# Patient Record
Sex: Male | Born: 1975 | Race: Black or African American | Hispanic: No | Marital: Married | Smoking: Former smoker
Health system: Southern US, Community
[De-identification: ages and names within clinical notes are randomized; demographics above are authoritative.]

## PROBLEM LIST (undated history)

## (undated) DIAGNOSIS — M1990 Unspecified osteoarthritis, unspecified site: Secondary | ICD-10-CM

## (undated) DIAGNOSIS — G4733 Obstructive sleep apnea (adult) (pediatric): Secondary | ICD-10-CM

## (undated) DIAGNOSIS — R030 Elevated blood-pressure reading, without diagnosis of hypertension: Secondary | ICD-10-CM

## (undated) DIAGNOSIS — K760 Fatty (change of) liver, not elsewhere classified: Secondary | ICD-10-CM

## (undated) DIAGNOSIS — T8859XA Other complications of anesthesia, initial encounter: Secondary | ICD-10-CM

## (undated) DIAGNOSIS — I10 Essential (primary) hypertension: Secondary | ICD-10-CM

## (undated) DIAGNOSIS — R7301 Impaired fasting glucose: Secondary | ICD-10-CM

## (undated) DIAGNOSIS — F32A Depression, unspecified: Secondary | ICD-10-CM

## (undated) DIAGNOSIS — Z87442 Personal history of urinary calculi: Secondary | ICD-10-CM

## (undated) DIAGNOSIS — F419 Anxiety disorder, unspecified: Secondary | ICD-10-CM

## (undated) DIAGNOSIS — E669 Obesity, unspecified: Secondary | ICD-10-CM

## (undated) HISTORY — DX: Elevated blood-pressure reading, without diagnosis of hypertension: R03.0

## (undated) HISTORY — DX: Obstructive sleep apnea (adult) (pediatric): G47.33

## (undated) HISTORY — DX: Obesity, unspecified: E66.9

## (undated) HISTORY — DX: Impaired fasting glucose: R73.01

---

## 2010-08-22 ENCOUNTER — Inpatient Hospital Stay (INDEPENDENT_AMBULATORY_CARE_PROVIDER_SITE_OTHER)
Admission: RE | Admit: 2010-08-22 | Discharge: 2010-08-22 | Disposition: A | Discharge status: Home / Self Care | Payer: Self-pay | Source: Ambulatory Visit | Attending: Family Medicine | Admitting: Family Medicine

## 2010-08-22 DIAGNOSIS — H109 Unspecified conjunctivitis: Secondary | ICD-10-CM

## 2011-07-18 LAB — HM DIABETES EYE EXAM: HM Diabetic Eye Exam: ABNORMAL

## 2011-08-21 ENCOUNTER — Other Ambulatory Visit (INDEPENDENT_AMBULATORY_CARE_PROVIDER_SITE_OTHER): Payer: 59

## 2011-08-21 ENCOUNTER — Encounter: Payer: Self-pay | Admitting: Internal Medicine

## 2011-08-21 ENCOUNTER — Ambulatory Visit (INDEPENDENT_AMBULATORY_CARE_PROVIDER_SITE_OTHER): Payer: 59 | Admitting: Internal Medicine

## 2011-08-21 VITALS — BP 118/78 | HR 68 | Temp 97.4°F | Resp 16 | Ht 68.0 in | Wt 277.2 lb

## 2011-08-21 DIAGNOSIS — Z23 Encounter for immunization: Secondary | ICD-10-CM

## 2011-08-21 DIAGNOSIS — Z136 Encounter for screening for cardiovascular disorders: Secondary | ICD-10-CM

## 2011-08-21 DIAGNOSIS — Z0000 Encounter for general adult medical examination without abnormal findings: Secondary | ICD-10-CM

## 2011-08-21 DIAGNOSIS — R0989 Other specified symptoms and signs involving the circulatory and respiratory systems: Secondary | ICD-10-CM

## 2011-08-21 DIAGNOSIS — R0609 Other forms of dyspnea: Secondary | ICD-10-CM

## 2011-08-21 DIAGNOSIS — R7309 Other abnormal glucose: Secondary | ICD-10-CM

## 2011-08-21 DIAGNOSIS — R0683 Snoring: Secondary | ICD-10-CM

## 2011-08-21 DIAGNOSIS — B353 Tinea pedis: Secondary | ICD-10-CM

## 2011-08-21 LAB — URINALYSIS, ROUTINE W REFLEX MICROSCOPIC
Bilirubin Urine: NEGATIVE
Hgb urine dipstick: NEGATIVE
Ketones, ur: NEGATIVE
Leukocytes, UA: NEGATIVE
Nitrite: NEGATIVE
Specific Gravity, Urine: >=1.03 (ref 1.000–1.030)
Total Protein, Urine: NEGATIVE
Urine Glucose: NEGATIVE
Urobilinogen, UA: 0.2 (ref 0.0–1.0)
pH: 5.5 (ref 5.0–8.0)

## 2011-08-21 LAB — COMPREHENSIVE METABOLIC PANEL
ALT: 31 U/L (ref 0–53)
AST: 25 U/L (ref 0–37)
Albumin: 3.4 g/dL — ABNORMAL LOW (ref 3.5–5.2)
Alkaline Phosphatase: 87 U/L (ref 39–117)
BUN: 8 mg/dL (ref 6–23)
CO2: 28 mEq/L (ref 19–32)
Calcium: 9 mg/dL (ref 8.4–10.5)
Chloride: 106 mEq/L (ref 96–112)
Creatinine, Ser: 1.1 mg/dL (ref 0.4–1.5)
GFR: 95.7 mL/min (ref >60.00)
Glucose, Bld: 97 mg/dL (ref 70–99)
Potassium: 4.1 mEq/L (ref 3.5–5.1)
Sodium: 141 mEq/L (ref 135–145)
Total Bilirubin: 0.7 mg/dL (ref 0.3–1.2)
Total Protein: 7.4 g/dL (ref 6.0–8.3)

## 2011-08-21 LAB — LIPID PANEL
Cholesterol: 200 mg/dL (ref 0–200)
HDL: 39.7 mg/dL (ref >39.00)
LDL Cholesterol: 148 mg/dL — ABNORMAL HIGH (ref 0–99)
Total CHOL/HDL Ratio: 5
Triglycerides: 63 mg/dL (ref 0.0–149.0)
VLDL: 12.6 mg/dL (ref 0.0–40.0)

## 2011-08-21 LAB — HM DIABETES FOOT EXAM

## 2011-08-21 LAB — CBC WITH DIFFERENTIAL/PLATELET
Basophils Absolute: 0 10*3/uL (ref 0.0–0.1)
Basophils Relative: 0.5 % (ref 0.0–3.0)
Eosinophils Absolute: 0.1 10*3/uL (ref 0.0–0.7)
Eosinophils Relative: 1.3 % (ref 0.0–5.0)
HCT: 43.8 % (ref 39.0–52.0)
Hemoglobin: 14.4 g/dL (ref 13.0–17.0)
Lymphocytes Relative: 46.6 % — ABNORMAL HIGH (ref 12.0–46.0)
Lymphs Abs: 4.1 10*3/uL — ABNORMAL HIGH (ref 0.7–4.0)
MCHC: 32.8 g/dL (ref 30.0–36.0)
MCV: 86 fl (ref 78.0–100.0)
Monocytes Absolute: 0.5 10*3/uL (ref 0.1–1.0)
Monocytes Relative: 5.8 % (ref 3.0–12.0)
Neutro Abs: 4 10*3/uL (ref 1.4–7.7)
Neutrophils Relative %: 45.8 % (ref 43.0–77.0)
Platelets: 246 10*3/uL (ref 150.0–400.0)
RBC: 5.09 Mil/uL (ref 4.22–5.81)
RDW: 14 % (ref 11.5–14.6)
WBC: 8.8 10*3/uL (ref 4.5–10.5)

## 2011-08-21 LAB — TSH: TSH: 1.62 u[IU]/mL (ref 0.35–5.50)

## 2011-08-21 LAB — HEMOGLOBIN A1C: Hgb A1c MFr Bld: 6.2 % (ref 4.6–6.5)

## 2011-08-21 MED ORDER — CICLOPIROX 0.77 % EX GEL: 1.0000 | Freq: Two times a day (BID) | CUTANEOUS | Status: DC

## 2011-08-21 NOTE — Assessment & Plan Note (Signed)
He needs a sleep evaluation

## 2011-08-21 NOTE — Assessment & Plan Note (Signed)
I will check his a1c today 

## 2011-08-21 NOTE — Assessment & Plan Note (Signed)
Exam done, labs ordered, vaccines were updated, pt ed material was given 

## 2011-08-21 NOTE — Patient Instructions (Signed)
Health Maintenance, Males A healthy lifestyle and preventative care can promote health and wellness.  Maintain regular health, dental, and eye exams.   Eat a healthy diet. Foods like vegetables, fruits, whole grains, low-fat dairy products, and lean protein foods contain the nutrients you need without too many calories. Decrease your intake of foods high in solid fats, added sugars, and salt. Get information about a proper diet from your caregiver, if necessary.   Regular physical exercise is one of the most important things you can do for your health. Most adults should get at least 150 minutes of moderate-intensity exercise (any activity that increases your heart rate and causes you to sweat) each week. In addition, most adults need muscle-strengthening exercises on 2 or more days a week.    Maintain a healthy weight. The body mass index (BMI) is a screening tool to identify possible weight problems. It provides an estimate of body fat based on height and weight. Your caregiver can help determine your BMI, and can help you achieve or maintain a healthy weight. For adults 20 years and older:   A BMI below 18.5 is considered underweight.   A BMI of 18.5 to 24.9 is normal.   A BMI of 25 to 29.9 is considered overweight.   A BMI of 30 and above is considered obese.   Maintain normal blood lipids and cholesterol by exercising and minimizing your intake of saturated fat. Eat a balanced diet with plenty of fruits and vegetables. Blood tests for lipids and cholesterol should begin at age 20 and be repeated every 5 years. If your lipid or cholesterol levels are high, you are over 50, or you are a high risk for heart disease, you may need your cholesterol levels checked more frequently.Ongoing high lipid and cholesterol levels should be treated with medicines, if diet and exercise are not effective.   If you smoke, find out from your caregiver how to quit. If you do not use tobacco, do not start.    If you choose to drink alcohol, do not exceed 2 drinks per day. One drink is considered to be 12 ounces (355 mL) of beer, 5 ounces (148 mL) of wine, or 1.5 ounces (44 mL) of liquor.   Avoid use of street drugs. Do not share needles with anyone. Ask for help if you need support or instructions about stopping the use of drugs.   High blood pressure causes heart disease and increases the risk of stroke. Blood pressure should be checked at least every 1 to 2 years. Ongoing high blood pressure should be treated with medicines if weight loss and exercise are not effective.   If you are 45 to 36 years old, ask your caregiver if you should take aspirin to prevent heart disease.   Diabetes screening involves taking a blood sample to check your fasting blood sugar level. This should be done once every 3 years, after age 45, if you are within normal weight and without risk factors for diabetes. Testing should be considered at a younger age or be carried out more frequently if you are overweight and have at least 1 risk factor for diabetes.   Colorectal cancer can be detected and often prevented. Most routine colorectal cancer screening begins at the age of 50 and continues through age 75. However, your caregiver may recommend screening at an earlier age if you have risk factors for colon cancer. On a yearly basis, your caregiver may provide home test kits to check for hidden   blood in the stool. Use of a small camera at the end of a tube, to directly examine the colon (sigmoidoscopy or colonoscopy), can detect the earliest forms of colorectal cancer. Talk to your caregiver about this at age 50, when routine screening begins. Direct examination of the colon should be repeated every 5 to 10 years through age 75, unless early forms of pre-cancerous polyps or small growths are found.   Hepatitis C blood testing is recommended for all people born from 1945 through 1965 and any individual with known risks for  hepatitis C.   Healthy men should no longer receive prostate-specific antigen (PSA) blood tests as part of routine cancer screening. Consult with your caregiver about prostate cancer screening.   Testicular cancer screening is not recommended for adolescents or adult males who have no symptoms. Screening includes self-exam, caregiver exam, and other screening tests. Consult with your caregiver about any symptoms you have or any concerns you have about testicular cancer.   Practice safe sex. Use condoms and avoid high-risk sexual practices to reduce the spread of sexually transmitted infections (STIs).   Use sunscreen with a sun protection factor (SPF) of 30 or greater. Apply sunscreen liberally and repeatedly throughout the day. You should seek shade when your shadow is shorter than you. Protect yourself by wearing long sleeves, pants, a wide-brimmed hat, and sunglasses year round, whenever you are outdoors.   Notify your caregiver of new moles or changes in moles, especially if there is a change in shape or color. Also notify your caregiver if a mole is larger than the size of a pencil eraser.   A one-time screening for abdominal aortic aneurysm (AAA) and surgical repair of large AAAs by sound wave imaging (ultrasonography) is recommended for ages 65 to 75 years who are current or former smokers.   Stay current with your immunizations.  Document Released: 10/07/2007 Document Revised: 03/30/2011 Document Reviewed: 09/05/2010 [employer] Patient Information 2012 [employer]. 

## 2011-08-21 NOTE — Assessment & Plan Note (Signed)
His EKG is normal 

## 2011-08-21 NOTE — Assessment & Plan Note (Signed)
Start loprox 

## 2011-08-21 NOTE — Progress Notes (Signed)
  Subjective:    Patient ID: Frank Howe, male    DOB: 05/01/1975, 36 y.o.   MRN: [id number]  HPI New to me he tells me that he needs to be screened for DM - he went to see an eye doctor for evaluation of blurred vision recently and was told that his eyes show damage from high blood sugars or cholesterol.   Review of Systems  Constitutional: Positive for unexpected weight change (some weight gain). Negative for fever, chills, diaphoresis, activity change, appetite change and fatigue.  HENT: Negative.   Eyes: Positive for visual disturbance.  Respiratory: Positive for apnea (and heavy snoring). Negative for cough, choking, chest tightness, shortness of breath, wheezing and stridor.   Cardiovascular: Negative for chest pain, palpitations and leg swelling.  Gastrointestinal: Negative for nausea, vomiting, abdominal pain, diarrhea, constipation, blood in stool, abdominal distention, anal bleeding and rectal pain.  Genitourinary: Negative for dysuria, urgency, frequency, hematuria, flank pain, decreased urine volume, discharge, penile swelling, scrotal swelling, enuresis, difficulty urinating, genital sores, penile pain and testicular pain.  Musculoskeletal: Negative for myalgias, back pain, joint swelling, arthralgias and gait problem.  Skin: Positive for rash (and itching on his feet). Negative for color change, pallor and wound.  Neurological: Negative for dizziness, tremors, seizures, syncope, facial asymmetry, speech difficulty, weakness, light-headedness, numbness and headaches.  Hematological: Negative for adenopathy. Does not bruise/bleed easily.  Psychiatric/Behavioral: Negative.        Objective:   Physical Exam  Vitals reviewed. Constitutional: He is oriented to person, place, and time. He appears well-developed and well-nourished. No distress.  HENT:  Head: Normocephalic and atraumatic.  Mouth/Throat: Oropharynx is clear and moist. No oropharyngeal exudate.  Eyes: Conjunctivae  are normal. Right eye exhibits no discharge. Left eye exhibits no discharge. No scleral icterus.  Neck: Normal range of motion. Neck supple. No JVD present. No tracheal deviation present. No thyromegaly present.  Cardiovascular: Normal rate, regular rhythm, normal heart sounds and intact distal pulses.  Exam reveals no gallop and no friction rub.   No murmur heard. Pulmonary/Chest: Effort normal and breath sounds normal. No stridor. No respiratory distress. He has no wheezes. He has no rales. He exhibits no tenderness.  Abdominal: Soft. Bowel sounds are normal. He exhibits no distension. There is no tenderness. There is no rebound and no guarding. Hernia confirmed negative in the right inguinal area and confirmed negative in the left inguinal area.  Genitourinary: Testes normal and penis normal. Right testis shows no mass, no swelling and no tenderness. Right testis is descended. Left testis shows no mass, no swelling and no tenderness. Left testis is descended. Uncircumcised. No phimosis, paraphimosis, hypospadias, penile erythema or penile tenderness. No discharge found.  Musculoskeletal: Normal range of motion. He exhibits no edema and no tenderness.  Lymphadenopathy:    He has no cervical adenopathy.       Right: No inguinal adenopathy present.       Left: No inguinal adenopathy present.  Neurological: He is oriented to person, place, and time.  Skin: Skin is warm and dry. No rash noted. He is not diaphoretic. No erythema. No pallor.       On his feet there is scattered scale and erythema and in the webspace there is scale and maceration  Psychiatric: His behavior is normal. Judgment and thought content normal.          Assessment & Plan:

## 2011-08-22 ENCOUNTER — Encounter: Payer: Self-pay | Admitting: Internal Medicine

## 2011-08-22 ENCOUNTER — Ambulatory Visit (INDEPENDENT_AMBULATORY_CARE_PROVIDER_SITE_OTHER): Payer: 59 | Admitting: Internal Medicine

## 2011-08-22 ENCOUNTER — Encounter: Payer: Self-pay | Admitting: *Deleted

## 2011-08-22 ENCOUNTER — Telehealth: Payer: Self-pay

## 2011-08-22 VITALS — BP 142/88 | HR 80 | Ht 68.0 in | Wt 284.6 lb

## 2011-08-22 DIAGNOSIS — R0609 Other forms of dyspnea: Secondary | ICD-10-CM

## 2011-08-22 DIAGNOSIS — B353 Tinea pedis: Secondary | ICD-10-CM

## 2011-08-22 DIAGNOSIS — G4733 Obstructive sleep apnea (adult) (pediatric): Secondary | ICD-10-CM

## 2011-08-22 DIAGNOSIS — R0683 Snoring: Secondary | ICD-10-CM

## 2011-08-22 DIAGNOSIS — R0989 Other specified symptoms and signs involving the circulatory and respiratory systems: Secondary | ICD-10-CM

## 2011-08-22 MED ORDER — OXICONAZOLE NITRATE 1 % EX LOTN: TOPICAL_LOTION | Freq: Two times a day (BID) | CUTANEOUS | Status: DC

## 2011-08-22 NOTE — Progress Notes (Signed)
08/22/11- 35 yoM smoker with loud snoring, witnessed apnea and daytime fatigue Referred by Dr. Yetta Barre. States sleeps an average of 5 hours a night.  Had recent physical exam, Dr. Yetta Barre suggested obstructive sleep apnea and recommended testing. History of loud snoring. 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 Told that he stops breathing. Bedtime 3 AM with very short sleep latency, waking once or twice during the night before up at 7:30 AM. Admits this is not enough sleep. Weight gain 10 pounds in 2 years. Little caffeine, no sleep medicine. No ENT surgery or recognized problems with his nose. History of high blood pressure but not heart or lung disease. He has had no surgery at all.  Prior to Admission medications   Medication Sig Start Date End Date Taking? Authorizing Provider  Acetaminophen (TYLENOL PO) Take by mouth as needed.   Yes Historical Provider, MD  terbinafine (LAMISIL AT) 1 % cream Apply topically 2 (two) times daily. 08/23/11 08/22/12  Etta Grandchild, MD   Past Medical History  Diagnosis Date  . Gout    History reviewed. No pertinent past surgical history.  Family History  Problem Relation Age of Onset  . Arthritis Mother   . Hypertension Mother   . Stroke Mother   . Cancer Neg Hx   . Alcohol abuse Neg Hx   . Heart disease Neg Hx   . Hyperlipidemia Neg Hx   . Hearing loss Neg Hx   . Kidney disease Neg Hx   . Early death Neg Hx    History   Social History  . Marital Status: Married    Spouse Name: N/A    Number of Children: 3  . Years of Education: N/A   Occupational History  .  Ups   Social History Main Topics  . Smoking status: Current Some Day Smoker -- 0.2 packs/day for 4 years    Types: Cigarettes  . Smokeless tobacco: Never Used   Comment: once every 3 mos  . Alcohol Use: 1.2 oz/week    2 Shots of liquor per week  . Drug Use: No  . Sexually Active: Yes   Other Topics Concern  . Not on file   Social History  Narrative   Caffienated drinks-yesSeat belt use often-yesRegular Exercise-noSmoke alarm in the home-yesFirearms/guns in the home-noHistory of physical abuse-yes   ROS-see HPI Constitutional:   No-weight loss, night sweats, fevers, chills,  +fatigue, lassitude. HEENT:   No-  headaches, difficulty swallowing, tooth/dental problems, sore throat,       No-  sneezing, itching, ear ache, nasal congestion, post nasal drip,  CV:  No-   chest pain, orthopnea, PND, swelling in lower extremities, anasarca, dizziness, palpitations Resp: No-   shortness of breath with exertion or at rest.              No-   productive cough,  No non-productive cough,  No- coughing up of blood.              No-   change in color of mucus.  No- wheezing.   Skin: No-   rash or lesions. GI:  No-   heartburn, indigestion, abdominal pain, nausea, vomiting, diarrhea,                 change in bowel habits, loss of appetite GU: No-   dysuria, change in color of urine, no urgency or frequency.  No- flank pain. MS:  No-  joint pain or swelling.  No- decreased range of motion.  No- back pain. Neuro-     nothing unusual Psych:  No- change in mood or affect. No depression or anxiety.  No memory loss.   OBJ- Physical Exam General- Alert, Oriented, Affect-appropriate, Distress- none acute, obese Skin- rash-none, lesions- none, excoriation- none Lymphadenopathy- none Head- atraumatic            Eyes- Gross vision intact, PERRLA, conjunctivae and secretions clear            Ears- Hearing, canals-normal            Nose- Clear, no-Septal dev, mucus, polyps, erosion, perforation             Throat- Mallampati III-IV , mucosa clear , drainage- none, tonsils- atrophic, thick neck Neck- flexible , trachea midline, no stridor , thyroid nl, carotid no bruit, short thick neck Chest - symmetrical excursion , unlabored           Heart/CV- RRR , no murmur , no gallop  , no rub, nl s1 s2                           - JVD- none , edema- none,  stasis changes- none, varices- none           Lung- clear to P&A, wheeze- none, cough- none , dullness-none, rub- none           Chest wall-  Abd- tender-no, distended-no, bowel sounds-present, HSM- no Br/ Gen/ Rectal- Not done, not indicated Extrem- cyanosis- none, clubbing, none, atrophy- none, strength- nl Neuro- grossly intact to observation

## 2011-08-22 NOTE — Telephone Encounter (Signed)
Received fax from pharmacy stating that ciclopirox 0.77% has been discontinued by manufacturer. Please advise on alternative. Thanks

## 2011-08-22 NOTE — Patient Instructions (Signed)
Order- split protocol NPSG   Dx OSA 

## 2011-08-23 ENCOUNTER — Telehealth: Payer: Self-pay

## 2011-08-23 DIAGNOSIS — B353 Tinea pedis: Secondary | ICD-10-CM

## 2011-08-23 MED ORDER — TERBINAFINE HCL 1 % EX CREA: TOPICAL_CREAM | Freq: Two times a day (BID) | CUTANEOUS | Status: DC

## 2011-08-23 NOTE — Telephone Encounter (Signed)
done

## 2011-08-23 NOTE — Telephone Encounter (Signed)
Received fax from pharmacy stating that oxistat lotion has also been discontinued by manufacturer

## 2011-08-25 NOTE — Assessment & Plan Note (Signed)
Discussed and will schedule sleep study.

## 2011-09-12 ENCOUNTER — Ambulatory Visit (HOSPITAL_BASED_OUTPATIENT_CLINIC_OR_DEPARTMENT_OTHER): Payer: 59

## 2011-09-25 ENCOUNTER — Ambulatory Visit (HOSPITAL_BASED_OUTPATIENT_CLINIC_OR_DEPARTMENT_OTHER): Payer: 59

## 2012-01-30 ENCOUNTER — Telehealth: Payer: Self-pay | Admitting: Internal Medicine

## 2012-01-30 ENCOUNTER — Encounter: Payer: Self-pay | Admitting: Internal Medicine

## 2012-01-30 ENCOUNTER — Ambulatory Visit (INDEPENDENT_AMBULATORY_CARE_PROVIDER_SITE_OTHER): Payer: 59 | Admitting: Internal Medicine

## 2012-01-30 VITALS — BP 122/84 | HR 74 | Temp 98.2°F | Resp 16 | Wt 277.0 lb

## 2012-01-30 DIAGNOSIS — H6092 Unspecified otitis externa, left ear: Secondary | ICD-10-CM | POA: Insufficient documentation

## 2012-01-30 DIAGNOSIS — T162XXA Foreign body in left ear, initial encounter: Secondary | ICD-10-CM | POA: Insufficient documentation

## 2012-01-30 DIAGNOSIS — H60399 Other infective otitis externa, unspecified ear: Secondary | ICD-10-CM

## 2012-01-30 DIAGNOSIS — T169XXA Foreign body in ear, unspecified ear, initial encounter: Secondary | ICD-10-CM

## 2012-01-30 MED ORDER — NEOMYCIN-COLIST-HC-THONZONIUM 3.3-3-10-0.5 MG/ML OT SUSP: 3.0000 [drp] | Freq: Four times a day (QID) | OTIC | Status: DC

## 2012-01-30 NOTE — Assessment & Plan Note (Signed)
Start cortisporin otic susp 

## 2012-01-30 NOTE — Progress Notes (Signed)
  Subjective:    Patient ID: Frank Howe, male    DOB: [birth year], 36 y.o.   MRN: [id number]  Otalgia  There is pain in the left ear. Episode onset: 2-3 weeks. The problem has been gradually worsening. There has been no fever. The pain is at a severity of 2/10. The pain is mild. Associated symptoms include hearing loss. Pertinent negatives include no neck pain, rhinorrhea or sore throat. He has tried nothing for the symptoms.      Review of Systems  Constitutional: Negative.   HENT: Positive for hearing loss and ear pain. Negative for congestion, sore throat, facial swelling, rhinorrhea, sneezing, trouble swallowing, neck pain and postnasal drip.   Eyes: Negative.   Respiratory: Negative.   Cardiovascular: Negative.   Gastrointestinal: Negative.   Genitourinary: Negative.   Musculoskeletal: Negative.   Neurological: Negative.   Hematological: Negative.   Psychiatric/Behavioral: Negative.        Objective:   Physical Exam  Vitals reviewed. Constitutional: He is oriented to person, place, and time. He appears well-developed and well-nourished. No distress.  HENT:  Right Ear: Hearing, tympanic membrane, external ear and ear canal normal.  Left Ear: Hearing and tympanic membrane normal. No lacerations. There is swelling. No drainage or tenderness. A foreign body (large piece of cotton deep in the [facility]) is present. No mastoid tenderness.  No middle ear effusion. No decreased hearing is noted.  Eyes: Conjunctivae normal are normal. Right eye exhibits no discharge. Left eye exhibits no discharge. No scleral icterus.  Neck: Normal range of motion. Neck supple. No JVD present. No tracheal deviation present. No thyromegaly present.  Cardiovascular: Normal rate, regular rhythm, normal heart sounds and intact distal pulses.  Exam reveals no gallop and no friction rub.   No murmur heard. Pulmonary/Chest: Effort normal and breath sounds normal. No stridor. No respiratory distress. He has no  wheezes. He has no rales. He exhibits no tenderness.  Abdominal: Soft. Bowel sounds are normal. He exhibits no distension and no mass. There is no tenderness. There is no rebound and no guarding.  Musculoskeletal: Normal range of motion. He exhibits no edema and no tenderness.  Lymphadenopathy:    He has no cervical adenopathy.  Neurological: He is oriented to person, place, and time.  Skin: Skin is warm and dry. No rash noted. He is not diaphoretic. No erythema. No pallor.  Psychiatric: He has a normal mood and affect. His behavior is normal. Judgment and thought content normal.          Assessment & Plan:

## 2012-01-30 NOTE — Patient Instructions (Signed)
Otitis Externa Otitis externa is a bacterial or fungal infection of the outer ear canal. This is the area from the eardrum to the outside of the ear. Otitis externa is sometimes called "swimmer's ear." CAUSES  Possible causes of infection include:  Swimming in dirty water.  Moisture remaining in the ear after swimming or bathing.  Mild injury (trauma) to the ear.  Objects stuck in the ear (foreign body).  Cuts or scrapes (abrasions) on the outside of the ear. SYMPTOMS  The first symptom of infection is often itching in the ear canal. Later signs and symptoms may include swelling and redness of the ear canal, ear pain, and yellowish-white fluid (pus) coming from the ear. The ear pain may be worse when pulling on the earlobe. DIAGNOSIS  Your caregiver will perform a physical exam. A sample of fluid may be taken from the ear and examined for bacteria or fungi. TREATMENT  Antibiotic ear drops are often given for 10 to 14 days. Treatment may also include pain medicine or corticosteroids to reduce itching and swelling. PREVENTION   Keep your ear dry. Use the corner of a towel to absorb water out of the ear canal after swimming or bathing.  Avoid scratching or putting objects inside your ear. This can damage the ear canal or remove the protective wax that lines the canal. This makes it easier for bacteria and fungi to grow.  Avoid swimming in lakes, polluted water, or poorly chlorinated pools.  You may use ear drops made of rubbing alcohol and vinegar after swimming. Combine equal parts of white vinegar and alcohol in a bottle. Put 3 or 4 drops into each ear after swimming. HOME CARE INSTRUCTIONS   Apply antibiotic ear drops to the ear canal as prescribed by your caregiver.  Only take over-the-counter or prescription medicines for pain, discomfort, or fever as directed by your caregiver.  If you have diabetes, follow any additional treatment instructions from your caregiver.  Keep all  follow-up appointments as directed by your caregiver. SEEK MEDICAL CARE IF:   You have a fever.  Your ear is still red, swollen, painful, or draining pus after 3 days.  Your redness, swelling, or pain gets worse.  You have a severe headache.  You have redness, swelling, pain, or tenderness in the area behind your ear. MAKE SURE YOU:   Understand these instructions.  Will watch your condition.  Will get help right away if you are not doing well or get worse. Document Released: 04/10/2005 Document Revised: 07/03/2011 Document Reviewed: 04/27/2011 [employer] Patient Information 2013 [employer].  

## 2012-01-30 NOTE — Assessment & Plan Note (Signed)
I was not able to remove the cotton, I have referred him to ENT

## 2012-01-30 NOTE — Telephone Encounter (Signed)
Pt req a note for today Office Visit for school. Please call pt once its done.

## 2012-01-31 NOTE — Telephone Encounter (Signed)
Note provided and pt notified

## 2012-04-01 ENCOUNTER — Other Ambulatory Visit: Payer: Self-pay | Admitting: Orthopedic Surgery

## 2012-12-16 ENCOUNTER — Encounter: Payer: Self-pay | Admitting: Medical

## 2012-12-16 ENCOUNTER — Ambulatory Visit (INDEPENDENT_AMBULATORY_CARE_PROVIDER_SITE_OTHER): Payer: 59 | Admitting: Medical

## 2012-12-16 VITALS — BP 112/80 | HR 64 | Temp 98.5°F | Resp 16 | Wt 269.0 lb

## 2012-12-16 DIAGNOSIS — M79642 Pain in left hand: Secondary | ICD-10-CM

## 2012-12-16 DIAGNOSIS — M79609 Pain in unspecified limb: Secondary | ICD-10-CM

## 2012-12-16 DIAGNOSIS — R400 Somnolence: Secondary | ICD-10-CM

## 2012-12-16 DIAGNOSIS — K429 Umbilical hernia without obstruction or gangrene: Secondary | ICD-10-CM

## 2012-12-16 DIAGNOSIS — M109 Gout, unspecified: Secondary | ICD-10-CM

## 2012-12-16 DIAGNOSIS — G4710 Hypersomnia, unspecified: Secondary | ICD-10-CM

## 2012-12-16 DIAGNOSIS — R5381 Other malaise: Secondary | ICD-10-CM

## 2012-12-16 DIAGNOSIS — E669 Obesity, unspecified: Secondary | ICD-10-CM

## 2012-12-16 DIAGNOSIS — R0681 Apnea, not elsewhere classified: Secondary | ICD-10-CM

## 2012-12-16 LAB — BASIC METABOLIC PANEL
BUN: 10 mg/dL (ref 6–23)
CO2: 26 mEq/L (ref 19–32)
Calcium: 9.4 mg/dL (ref 8.4–10.5)
Chloride: 104 mEq/L (ref 96–112)
Creat: 1.11 mg/dL (ref 0.50–1.35)
Glucose, Bld: 90 mg/dL (ref 70–99)
Potassium: 4.5 mEq/L (ref 3.5–5.3)
Sodium: 138 mEq/L (ref 135–145)

## 2012-12-16 LAB — URIC ACID: Uric Acid, Serum: 8.6 mg/dL — ABNORMAL HIGH (ref 4.0–7.8)

## 2012-12-16 MED ORDER — INDOMETHACIN 50 MG PO CAPS: ORAL_CAPSULE | ORAL | Status: DC

## 2012-12-16 NOTE — Patient Instructions (Signed)
Gout  Gout is an inflammatory condition (arthritis) caused by a buildup of uric acid crystals in the joints. Uric acid is a chemical that is normally present in the blood. Under some circumstances, uric acid can form into crystals in your joints. This causes joint redness, soreness, and swelling (inflammation). Repeat attacks are common. Over time, uric acid crystals can form into masses (tophi) near a joint, causing disfigurement. Gout is treatable and often preventable.  CAUSES   The disease begins with elevated levels of uric acid in the blood. Uric acid is produced by your body when it breaks down a naturally found substance called purines. This also happens when you eat certain foods such as meats and fish. Causes of an elevated uric acid level include:   Being passed down from parent to child (heredity).   Diseases that cause increased uric acid production (obesity, psoriasis, some cancers).   Excessive alcohol use.   Diet, especially diets rich in meat and seafood.   Medicines, including certain cancer-fighting drugs (chemotherapy), diuretics, and aspirin.   Chronic kidney disease. The kidneys are no longer able to remove uric acid well.   Problems with metabolism.  Conditions strongly associated with gout include:   Obesity.   High blood pressure.   High cholesterol.   Diabetes.  Not everyone with elevated uric acid levels gets gout. It is not understood why some people get gout and others do not. Surgery, joint injury, and eating too much of certain foods are some of the factors that can lead to gout.  SYMPTOMS    An attack of gout comes on quickly. It causes intense pain with redness, swelling, and warmth in a joint.   Fever can occur.   Often, only one joint is involved. Certain joints are more commonly involved:   Base of the big toe.   Knee.   Ankle.   Wrist.   Finger.  Without treatment, an attack usually goes away in a few days to weeks. Between attacks, you usually will not have  symptoms, which is different from many other forms of arthritis.  DIAGNOSIS   Your caregiver will suspect gout based on your symptoms and exam. Removal of fluid from the joint (arthrocentesis) is done to check for uric acid crystals. Your caregiver will give you a medicine that numbs the area (local anesthetic) and use a needle to remove joint fluid for exam. Gout is confirmed when uric acid crystals are seen in joint fluid, using a special microscope. Sometimes, blood, urine, and X-ray tests are also used.  TREATMENT   There are 2 phases to gout treatment: treating the sudden onset (acute) attack and preventing attacks (prophylaxis).  Treatment of an Acute Attack   Medicines are used. These include anti-inflammatory medicines or steroid medicines.   An injection of steroid medicine into the affected joint is sometimes necessary.   The painful joint is rested. Movement can worsen the arthritis.   You may use warm or cold treatments on painful joints, depending which works best for you.   Discuss the use of coffee, vitamin C, or cherries with your caregiver. These may be helpful treatment options.  Treatment to Prevent Attacks  After the acute attack subsides, your caregiver may advise prophylactic medicine. These medicines either help your kidneys eliminate uric acid from your body or decrease your uric acid production. You may need to stay on these medicines for a very long time.  The early phase of treatment with prophylactic medicine can be associated   with an increase in acute gout attacks. For this reason, during the first few months of treatment, your caregiver may also advise you to take medicines usually used for acute gout treatment. Be sure you understand your caregiver's directions.  You should also discuss dietary treatment with your caregiver. Certain foods such as meats and fish can increase uric acid levels. Other foods such as dairy can decrease levels. Your caregiver can give you a list of foods  to avoid.  HOME CARE INSTRUCTIONS    Do not take aspirin to relieve pain. This raises uric acid levels.   Only take over-the-counter or prescription medicines for pain, discomfort, or fever as directed by your caregiver.   Rest the joint as much as possible. When in bed, keep sheets and blankets off painful areas.   Keep the affected joint raised (elevated).   Use crutches if the painful joint is in your leg.   Drink enough water and fluids to keep your urine clear or pale yellow. This helps your body get rid of uric acid. Do not drink alcoholic beverages. They slow the passage of uric acid.   Follow your caregiver's dietary instructions. Pay careful attention to the amount of protein you eat. Your daily diet should emphasize fruits, vegetables, whole grains, and fat-free or low-fat milk products.   Maintain a healthy body weight.  SEEK MEDICAL CARE IF:    You have an oral temperature above 102 F (38.9 C).   You develop diarrhea, vomiting, or any side effects from medicines.   You do not feel better in 24 hours, or you are getting worse.  SEEK IMMEDIATE MEDICAL CARE IF:    Your joint becomes suddenly more tender and you have:   Chills.   An oral temperature above 102 F (38.9 C), not controlled by medicine.  MAKE SURE YOU:    Understand these instructions.   Will watch your condition.   Will get help right away if you are not doing well or get worse.  Document Released: 04/07/2000 Document Revised: 07/03/2011 Document Reviewed: 07/19/2009  [employer] Patient Information 2014 [employer].
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
## 2012-12-17 ENCOUNTER — Other Ambulatory Visit: Payer: Self-pay | Admitting: Medical

## 2012-12-17 MED ORDER — FEBUXOSTAT 40 MG PO TABS: 80.0000 mg | ORAL_TABLET | Freq: Every day | ORAL | Status: DC

## 2012-12-31 ENCOUNTER — Other Ambulatory Visit: Payer: Self-pay | Admitting: Family Medicine

## 2012-12-31 DIAGNOSIS — G4733 Obstructive sleep apnea (adult) (pediatric): Secondary | ICD-10-CM

## 2013-01-17 ENCOUNTER — Telehealth: Payer: Self-pay | Admitting: Internal Medicine

## 2013-01-17 ENCOUNTER — Other Ambulatory Visit: Payer: Self-pay | Admitting: Medical

## 2013-01-17 MED ORDER — METHYLPREDNISOLONE (PAK) 4 MG PO TABS: ORAL_TABLET | ORAL | Status: DC

## 2013-01-17 NOTE — Telephone Encounter (Signed)
Pt needs a refill on indomethacin. But states he would like something stronger cause its not working for him. [facility] [address]

## 2013-01-17 NOTE — Telephone Encounter (Signed)
Patient is aware of your instructions. CLS

## 2013-01-17 NOTE — Telephone Encounter (Signed)
Stop indocin or OTC NSAID such as ibuprofen, aleve, for the next week.  Begin medrol dose pak steroid . Often when we start new medication such as Uloric, that can sometimes precipitate a gout attack.  C/t Uloric, but do the steroid pak for the next week.

## 2013-01-20 ENCOUNTER — Institutional Professional Consult (permissible substitution): Payer: 59 | Admitting: Neurology

## 2013-01-22 ENCOUNTER — Institutional Professional Consult (permissible substitution): Payer: 59 | Admitting: Neurology

## 2013-01-27 ENCOUNTER — Encounter: Payer: Self-pay | Admitting: Medical

## 2013-01-27 ENCOUNTER — Ambulatory Visit (INDEPENDENT_AMBULATORY_CARE_PROVIDER_SITE_OTHER): Payer: 59 | Admitting: Medical

## 2013-01-27 ENCOUNTER — Telehealth: Payer: Self-pay | Admitting: Family Medicine

## 2013-01-27 VITALS — BP 120/82 | HR 92 | Temp 97.8°F | Resp 18 | Ht 69.0 in | Wt 271.0 lb

## 2013-01-27 DIAGNOSIS — E669 Obesity, unspecified: Secondary | ICD-10-CM

## 2013-01-27 DIAGNOSIS — R079 Chest pain, unspecified: Secondary | ICD-10-CM

## 2013-01-27 DIAGNOSIS — R0683 Snoring: Secondary | ICD-10-CM

## 2013-01-27 DIAGNOSIS — R0609 Other forms of dyspnea: Secondary | ICD-10-CM

## 2013-01-27 DIAGNOSIS — Z23 Encounter for immunization: Secondary | ICD-10-CM

## 2013-01-27 DIAGNOSIS — M109 Gout, unspecified: Secondary | ICD-10-CM

## 2013-01-27 DIAGNOSIS — Z0000 Encounter for general adult medical examination without abnormal findings: Secondary | ICD-10-CM

## 2013-01-27 LAB — POCT URINALYSIS DIPSTICK
Bilirubin, UA: NEGATIVE
Blood, UA: NEGATIVE
Glucose, UA: NEGATIVE
Ketones, UA: NEGATIVE
Leukocytes, UA: NEGATIVE
Nitrite, UA: NEGATIVE
Protein, UA: NEGATIVE
Spec Grav, UA: <=1.005
Urobilinogen, UA: NEGATIVE
pH, UA: 8

## 2013-01-27 LAB — HEPATIC FUNCTION PANEL
ALT: 29 U/L (ref 0–53)
AST: 20 U/L (ref 0–37)
Albumin: 3.9 g/dL (ref 3.5–5.2)
Alkaline Phosphatase: 90 U/L (ref 39–117)
Bilirubin, Direct: 0.2 mg/dL (ref 0.0–0.3)
Indirect Bilirubin: 0.6 mg/dL (ref 0.0–0.9)
Total Bilirubin: 0.8 mg/dL (ref 0.3–1.2)
Total Protein: 7.1 g/dL (ref 6.0–8.3)

## 2013-01-27 LAB — CBC WITH DIFFERENTIAL/PLATELET
Basophils Absolute: 0 10*3/uL (ref 0.0–0.1)
Basophils Relative: 0 % (ref 0–1)
Eosinophils Absolute: 0.2 10*3/uL (ref 0.0–0.7)
Eosinophils Relative: 2 % (ref 0–5)
HCT: 42.6 % (ref 39.0–52.0)
Hemoglobin: 14.4 g/dL (ref 13.0–17.0)
Lymphocytes Relative: 44 % (ref 12–46)
Lymphs Abs: 4.1 10*3/uL — ABNORMAL HIGH (ref 0.7–4.0)
MCH: 28 pg (ref 26.0–34.0)
MCHC: 33.8 g/dL (ref 30.0–36.0)
MCV: 82.9 fL (ref 78.0–100.0)
Monocytes Absolute: 0.5 10*3/uL (ref 0.1–1.0)
Monocytes Relative: 6 % (ref 3–12)
Neutro Abs: 4.6 10*3/uL (ref 1.7–7.7)
Neutrophils Relative %: 48 % (ref 43–77)
Platelets: 306 10*3/uL (ref 150–400)
RBC: 5.14 MIL/uL (ref 4.22–5.81)
RDW: 14.1 % (ref 11.5–15.5)
WBC: 9.4 10*3/uL (ref 4.0–10.5)

## 2013-01-27 LAB — LIPID PANEL
Cholesterol: 187 mg/dL (ref 0–200)
HDL: 44 mg/dL (ref >39)
LDL Cholesterol: 126 mg/dL — ABNORMAL HIGH (ref 0–99)
Total CHOL/HDL Ratio: 4.3 Ratio
Triglycerides: 86 mg/dL (ref <150)
VLDL: 17 mg/dL (ref 0–40)

## 2013-01-27 LAB — HEMOGLOBIN A1C
Hgb A1c MFr Bld: 6.3 % — ABNORMAL HIGH (ref <5.7)
Mean Plasma Glucose: 134 mg/dL — ABNORMAL HIGH (ref <117)

## 2013-01-27 MED ORDER — INDOMETHACIN 50 MG PO CAPS: ORAL_CAPSULE | ORAL | Status: DC

## 2013-01-27 MED ORDER — FEBUXOSTAT 40 MG PO TABS: 40.0000 mg | ORAL_TABLET | Freq: Every day | ORAL | Status: DC

## 2013-01-27 NOTE — Telephone Encounter (Signed)
Message copied by Janeice Robinson on Mon Jan 27, 2013  2:27 PM ------      Message from: Jac Canavan      Created: Mon Jan 27, 2013  1:36 PM       Refer to hospital-based sleep study, see last office note, Epworth sleep scale ------

## 2013-01-27 NOTE — Telephone Encounter (Signed)
Referral was sent over to [facility] sleep lab the [facility] sleep study was out of network. CLS
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
## 2013-01-27 NOTE — Patient Instructions (Signed)
Dr. Yancey Flemings, dentist [address] [place], [place] [address] [phone] [email or web address]   Begin Uloric 40mg  daily for gout prevention.   For the first 2 weeks as you start Uloric, begin Indocin 1-2 times to help prevent flare up.  Call if problems  We will look into the sleep study.  We will call with lab results.   Diabetes Meal Planning Guide The diabetes meal planning guide is a tool to help you plan your meals and snacks. It is important for people with diabetes to manage their blood glucose (sugar) levels. Choosing the right foods and the right amounts throughout your day will help control your blood glucose. Eating right can even help you improve your blood pressure and reach or maintain a healthy weight.  CARBOHYDRATE COUNTING MADE EASY When you eat carbohydrates, they turn to sugar. This raises your blood glucose level. Counting carbohydrates can help you control this level so you feel better. When you plan your meals by counting carbohydrates, you can have more flexibility in what you eat and balance your medicine with your food intake.  Carbohydrate counting simply means adding up the total amount of carbohydrate grams in your meals and snacks. Try to eat about the same amount at each meal. Foods with carbohydrates are listed below.   Each portion below is 1 carbohydrate serving or 15 grams of carbohydrates.    1800 Calorie Diet for Diabetes Meal Planning The 1800 calorie diet is designed for eating up to 1800 calories each day. Following this diet and making healthy meal choices can help improve overall health. This diet controls blood sugar (glucose) levels and can also help lower blood pressure and cholesterol.  SERVING SIZES Measuring foods and serving sizes helps to make sure you are getting the right amount of food. The list below tells how big or small some common serving sizes are:  1 oz.........4 stacked dice.  3 oz........Marland KitchenDeck of cards.  1 tsp.......Marland KitchenTip  of little finger.  1 tbs......Marland KitchenMarland KitchenThumb.  2 tbs.......Marland KitchenGolf ball.   cup......Marland KitchenHalf of a fist.  1 cup.......Marland KitchenA fist.   GUIDELINES FOR CHOOSING FOODS The goal of this diet is to eat a variety of foods and limit calories to 1800 each day. This can be done by choosing foods that are low in calories and fat. The diet also suggests eating small amounts of food frequently. Doing this helps control your blood glucose levels so they do not get too high or too low. Each meal or snack may include a protein food source to help you feel more satisfied and to stabilize your blood glucose. Try to eat about the same amount of food around the same time each day. This includes weekend days, travel days, and days off work. Space your meals about 4 to 5 hours apart and add a snack between them if you wish.  For example, a daily food plan could include breakfast, a morning snack, lunch, dinner, and an evening snack. Healthy meals and snacks include whole grains, vegetables, fruits, lean meats, poultry, fish, and dairy products. As you plan your meals, select a variety of foods. Choose from the bread and starch, vegetable, fruit, dairy, and meat/protein groups. Examples of foods from each group and their suggested serving sizes are listed below. Use measuring cups and spoons to become familiar with what a healthy portion looks like. Bread and Starch Each serving equals 15 grams of carbohydrates.  1 slice bread.   bagel.   cup cold cereal (unsweetened).   cup hot  cereal or mashed potatoes.  1 small potato (size of a computer mouse).   cup cooked pasta or rice.   English muffin.  1 cup broth-based soup.  3 cups of popcorn.  4 to 6 whole-wheat crackers.   cup cooked beans, peas, or corn. Vegetable Each serving equals 5 grams of carbohydrates.   cup cooked vegetables.  1 cup raw vegetables.   cup tomato or vegetable juice. Fruit Each serving equals 15 grams of carbohydrates.  1 small  apple or orange.  1 cup watermelon or strawberries.   cup applesauce (no sugar added).  2 tbs raisins.   banana.   cup canned fruit, packed in water, its own juice, or sweetened with a sugar substitute.   cup unsweetened fruit juice. Dairy Each serving equals 12 to 15 grams of carbohydrates.  1 cup fat-free milk.  6 oz artificially sweetened yogurt or plain yogurt.  1 cup low-fat buttermilk.  1 cup soy milk.  1 cup almond milk. Meat/Protein  1 large egg.  2 to 3 oz meat, poultry, or fish.   cup low-fat cottage cheese.  1 tbs peanut butter.  1 oz low-fat cheese.   cup tuna in water.   cup tofu. Fat  1 tsp oil.  1 tsp trans-fat-free margarine.  1 tsp butter.  1 tsp mayonnaise.  2 tbs avocado.  1 tbs salad dressing.  1 tbs cream cheese.  2 tbs sour cream.  SAMPLE 1800 CALORIE DIET PLAN Breakfast   cup unsweetened cereal (1 carb serving).  1 cup fat-free milk (1 carb serving).  1 slice whole-wheat toast (1 carb serving).   small banana (1 carb serving).  1 scrambled egg.  1 tsp trans-fat-free margarine. Lunch  Tuna sandwich.  2 slices whole-wheat bread (2 carb servings).   cup canned tuna in water, drained.  1 tbs reduced fat mayonnaise.  1 stalk celery, chopped.  2 slices tomato.  1 lettuce leaf.  1 cup carrot sticks.  24 to 30 seedless grapes (2 carb servings).  6 oz light yogurt (1 carb serving). Afternoon Snack  3 graham cracker squares (1 carb serving).  Fat-free milk, 1 cup (1 carb serving).  1 tbs peanut butter. Dinner  3 oz salmon, broiled with 1 tsp oil.  1 cup mashed potatoes (2 carb servings) with 1 tsp trans-fat-free margarine.  1 cup fresh or frozen green beans.  1 cup steamed asparagus.  1 cup fat-free milk (1 carb serving). Evening Snack  3 cups air-popped popcorn (1 carb serving).  2 tbs parmesan cheese sprinkled on top.  MEAL PLAN Use this worksheet to help you make a daily  meal plan based on the 1800 calorie diet suggestions. If you are using this plan to help you control your blood glucose, you may interchange carbohydrate-containing foods (dairy, starches, and fruits). Select a variety of fresh foods of varying colors and flavors. The total amount of carbohydrate in your meals or snacks is more important than making sure you include all of the food groups every time you eat. Choose from the following foods to build your day's meals:  8 Starches.  4 Vegetables.  3 Fruits.  2 Dairy.  6 to 7 oz Meat/Protein.  Up to 4 Fats.  Your dietician can use this worksheet to help you decide how many servings and which types of foods are right for you. BREAKFAST Food Group and Servings / Food Choice Starch ________________________________________________________ Dairy _________________________________________________________ Fruit _________________________________________________________ Meat/Protein __________________________________________________ Fat ___________________________________________________________ LUNCH Food Group and  Servings / Food Choice Starch ________________________________________________________ Meat/Protein __________________________________________________ Vegetable _____________________________________________________ Fruit _________________________________________________________ Dairy _________________________________________________________ Fat ___________________________________________________________ Wilhemina Bonito Food Group and Servings / Food Choice Starch ________________________________________________________ Meat/Protein __________________________________________________ Fruit __________________________________________________________ Dairy _________________________________________________________ Wonda Cheng Food Group and Servings / Food Choice Starch _________________________________________________________ Meat/Protein  ___________________________________________________ Dairy __________________________________________________________ Vegetable ______________________________________________________ Fruit ___________________________________________________________ Fat ____________________________________________________________ Fermin Schwab Food Group and Servings / Food Choice Fruit __________________________________________________________ Meat/Protein ___________________________________________________ Dairy __________________________________________________________ Starch _________________________________________________________ DAILY TOTALS Starch ____________________________ Vegetable _________________________ Fruit _____________________________ Dairy _____________________________ Meat/Protein______________________ Fat _______________________________ Document Released: 10/31/2004 Document Revised: 07/03/2011 Document Reviewed: 02/24/2011 ExitCare Patient Information 2013 [place], [place].

## 2013-03-27 ENCOUNTER — Ambulatory Visit (HOSPITAL_BASED_OUTPATIENT_CLINIC_OR_DEPARTMENT_OTHER): Payer: 59 | Attending: Medical

## 2013-03-27 VITALS — Ht 68.0 in | Wt 277.0 lb

## 2013-03-27 DIAGNOSIS — G4733 Obstructive sleep apnea (adult) (pediatric): Secondary | ICD-10-CM | POA: Insufficient documentation

## 2013-03-29 DIAGNOSIS — G4733 Obstructive sleep apnea (adult) (pediatric): Secondary | ICD-10-CM

## 2013-03-29 NOTE — Sleep Study (Signed)
[facility]  NAME: Frank Howe DATE OF BIRTH:  05-19-1975 MEDICAL RECORD NUMBER [id number]  LOCATION: [facility]  PHYSICIAN: YOUNG,CLINTON D  DATE OF STUDY: 03/27/2013  SLEEP STUDY TYPE: Nocturnal Polysomnogram               REFERRING PHYSICIAN: Jac Canavan, PA-C  INDICATION FOR STUDY: Hypersomnia with sleep apnea   EPWORTH SLEEPINESS SCORE:   18/24 HEIGHT: 5\' 8"  (172.7 cm)  WEIGHT: 277 lb (125.646 kg)    Body mass index is 42.13 kg/(m^2).  NECK SIZE: 18.5 in.  MEDICATIONS: Charted for review  SLEEP ARCHITECTURE: Split study protocol. During the diagnostic phase, total sleep time 124.5 minutes with sleep efficiency 94.3%. Stage I was 18.5%, stage II 81.5%, stage III and REM were absent. Sleep latency 4.5 minutes, awake after sleep onset 3 minutes, arousal index 8.7. Bedtime medication: None.  RESPIRATORY DATA: Split protocol. Apnea hypopnea index (AHI) 41.9 per hour. A total of 87 events was recorded with 2 obstructive apneas, 3 central apneas, 82 hypopneas. Events were more common while supine. CPAP was titrated to 13 CWP, AHI 0 per hour. He wore a large Banker Simplus full-face mask with heated humidifier and a EPR of 3.  OXYGEN DATA: For CPAP snoring was moderately loud with oxygen desaturation to a nadir of 77% on room air. With CPAP control, snoring was presented and mean oxygen saturation held 95.5% on room air.  CARDIAC DATA: Normal cardiac rhythm  MOVEMENT/PARASOMNIA: No significant movement disturbance. No bathroom trips.  IMPRESSION/ RECOMMENDATION:   1) Severe obstructive sleep apnea/hypopnea syndrome, AHI 41.9 per hour with supine events. Moderately loud snoring with oxygen desaturation to a nadir of 77% on room air. 2) Successful CPAP titration to 13 CWP, AHI 0 per hour. He wore a large [employer] Simplus mask with heated humidifier. Snoring was prevented and mean oxygen saturation held 95.5% on room  air with CPAP.   Signed Clinton D. Young M.D. Waymon Budge Diplomate, American Board of Sleep Medicine  ELECTRONICALLY SIGNED ON:  03/29/2013, 12:26 PM [facility] PH: [phone]   FX: [phone] ACCREDITED BY THE AMERICAN ACADEMY OF SLEEP MEDICINE

## 2013-04-02 ENCOUNTER — Encounter: Payer: Self-pay | Admitting: Medical

## 2013-04-02 ENCOUNTER — Ambulatory Visit (INDEPENDENT_AMBULATORY_CARE_PROVIDER_SITE_OTHER): Payer: 59 | Admitting: Medical

## 2013-04-02 VITALS — BP 140/80 | HR 70 | Temp 98.1°F | Resp 18 | Wt 274.0 lb

## 2013-04-02 DIAGNOSIS — M109 Gout, unspecified: Secondary | ICD-10-CM

## 2013-04-02 DIAGNOSIS — E669 Obesity, unspecified: Secondary | ICD-10-CM

## 2013-04-02 DIAGNOSIS — G4733 Obstructive sleep apnea (adult) (pediatric): Secondary | ICD-10-CM

## 2013-04-02 NOTE — Patient Instructions (Signed)
I recommend exercising most days of the week using a type of exercise that they would enjoy and stick to such as walking, running, swimming, hiking, biking, aerobics, etc.  I recommend a healthy diet.    Do's:   whole grains such as whole grain pasta, rice, whole grains breads and whole grain cereals.  Use small quantities such as 1/2 cup per serving or 2 slices of bread per serving.    Eat 3-5 fruits daily  Eat beans at least once daily  Eat almonds in small quantities at least 3 days per week    If they eat meat, I recommend small portions of lean meats such as chicken, fish, and [place].  Eat as much NON corn and NON potato vegetables as they like, particularly raw or steamed  Drink several large glasses of water daily  Cautions:  Limit red meat  Limit corn and potatoes  Limit sweets, cake, pie, candy  Limit beer and alcohol  Avoid fried food, fast food, large portions  Avoid sugary drinks such as regular soda and sweet tea  Check insurance coverage for the following weight loss medications:  Belviq  Qsymia  Contrave

## 2013-04-02 NOTE — Progress Notes (Signed)
Subjective: Here for recheck.  I saw him recently for physical.  He is here mainly for recheck on sleep study.   He notes that he had a good night of sleep with the trial CPAP/titration.    Obesity - he has started making some diet and exercising changes.  Motivated to lose weight.  Gout - no recent flares ups since starting diet changes.  Is currently not taking Uloric.    Objective: Gen: wd, wn, obese AA male Heart: RRR, normal S1, S2, no murmurs Pulses normal Ext: no edema    Assessment: Encounter Diagnoses  Name Primary?  . Obstructive sleep apnea Yes  . Obesity, unspecified   . Gout     Plan: OSA - reviewed sleep study results.  Will set him up with home health for CPAP supplies.   Discussed compliance, getting use to the equipment, rechecking in 64mo, getting compliance report.  Discussed benefits of CPAP.  Obesity - discussed diet, exercise, weight loss efforts, benefits for OSA.  We also discussed medications . He will check insurance coverage regarding Qsymia, Belviq.   Gout - he doesn't want to take Uloric at this time.  He feels like after making some diet and hydration changes already, he has had less flare ups.  Recheck uric acid in 64mo.  If still elevated, will need to begin back on Uloric.

## 2013-06-24 ENCOUNTER — Emergency Department (INDEPENDENT_AMBULATORY_CARE_PROVIDER_SITE_OTHER): Payer: 59

## 2013-06-24 ENCOUNTER — Encounter (HOSPITAL_COMMUNITY): Payer: Self-pay | Admitting: Emergency Medicine

## 2013-06-24 ENCOUNTER — Emergency Department (INDEPENDENT_AMBULATORY_CARE_PROVIDER_SITE_OTHER)
Admission: EM | Admit: 2013-06-24 | Discharge: 2013-06-24 | Disposition: A | Discharge status: Home / Self Care | Payer: 59 | Source: Home / Self Care | Attending: Emergency Medicine | Admitting: Emergency Medicine

## 2013-06-24 DIAGNOSIS — S46019A Strain of muscle(s) and tendon(s) of the rotator cuff of unspecified shoulder, initial encounter: Secondary | ICD-10-CM

## 2013-06-24 DIAGNOSIS — S43429A Sprain of unspecified rotator cuff capsule, initial encounter: Secondary | ICD-10-CM

## 2013-06-24 DIAGNOSIS — IMO0002 Reserved for concepts with insufficient information to code with codable children: Secondary | ICD-10-CM

## 2013-06-24 DIAGNOSIS — S63509A Unspecified sprain of unspecified wrist, initial encounter: Secondary | ICD-10-CM

## 2013-06-24 DIAGNOSIS — T07XXXA Unspecified multiple injuries, initial encounter: Secondary | ICD-10-CM

## 2013-06-24 MED ORDER — IBUPROFEN 800 MG PO TABS
ORAL_TABLET | ORAL | Status: AC
  Filled 2013-06-24: qty 1

## 2013-06-24 MED ORDER — IBUPROFEN 800 MG PO TABS
800.0000 mg | ORAL_TABLET | Freq: Once | ORAL | Status: AC
  Administered 2013-06-24: 800 mg via ORAL

## 2013-06-24 MED ORDER — HYDROCODONE-ACETAMINOPHEN 5-325 MG PO TABS: ORAL_TABLET | ORAL | Status: DC
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
## 2013-06-24 NOTE — Discharge Instructions (Signed)
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
## 2015-03-17 ENCOUNTER — Ambulatory Visit: Payer: Managed Care, Other (non HMO) | Admitting: Family Medicine

## 2015-06-26 ENCOUNTER — Encounter (HOSPITAL_COMMUNITY): Payer: Self-pay | Admitting: Family Medicine

## 2015-06-26 ENCOUNTER — Emergency Department (HOSPITAL_COMMUNITY)
Admission: EM | Admit: 2015-06-26 | Discharge: 2015-06-26 | Disposition: A | Discharge status: Home / Self Care | Payer: Managed Care, Other (non HMO) | Attending: Emergency Medicine | Admitting: Emergency Medicine

## 2015-06-26 DIAGNOSIS — M7989 Other specified soft tissue disorders: Secondary | ICD-10-CM | POA: Insufficient documentation

## 2015-06-26 DIAGNOSIS — E669 Obesity, unspecified: Secondary | ICD-10-CM | POA: Diagnosis not present

## 2015-06-26 NOTE — ED Notes (Signed)
Pt here with sig swelling to legs x 1 week. Denies chest pain or SOB. sts hx of gout but usually not this bad. Also sts he noticed it became worse after eating salty food,.

## 2015-06-26 NOTE — ED Notes (Signed)
Pt came to nurse first to report he is leaving, does not want to wait any longer.  Advised pt that he is the next in line to get room, still does not want to wait.  Advised to return as needed.

## 2015-06-28 ENCOUNTER — Ambulatory Visit (INDEPENDENT_AMBULATORY_CARE_PROVIDER_SITE_OTHER): Payer: Managed Care, Other (non HMO) | Admitting: Medical

## 2015-06-28 ENCOUNTER — Encounter: Payer: Self-pay | Admitting: Medical

## 2015-06-28 ENCOUNTER — Ambulatory Visit
Admission: RE | Admit: 2015-06-28 | Discharge: 2015-06-28 | Disposition: A | Discharge status: Home / Self Care | Payer: Managed Care, Other (non HMO) | Source: Ambulatory Visit | Attending: Medical | Admitting: Medical

## 2015-06-28 VITALS — BP 156/100 | HR 76 | Wt 296.0 lb

## 2015-06-28 DIAGNOSIS — M25561 Pain in right knee: Secondary | ICD-10-CM

## 2015-06-28 DIAGNOSIS — R030 Elevated blood-pressure reading, without diagnosis of hypertension: Secondary | ICD-10-CM

## 2015-06-28 DIAGNOSIS — R7301 Impaired fasting glucose: Secondary | ICD-10-CM | POA: Diagnosis not present

## 2015-06-28 DIAGNOSIS — E669 Obesity, unspecified: Secondary | ICD-10-CM

## 2015-06-28 DIAGNOSIS — R7989 Other specified abnormal findings of blood chemistry: Secondary | ICD-10-CM | POA: Diagnosis not present

## 2015-06-28 DIAGNOSIS — M25469 Effusion, unspecified knee: Secondary | ICD-10-CM | POA: Insufficient documentation

## 2015-06-28 DIAGNOSIS — M25461 Effusion, right knee: Secondary | ICD-10-CM

## 2015-06-28 DIAGNOSIS — E790 Hyperuricemia without signs of inflammatory arthritis and tophaceous disease: Secondary | ICD-10-CM

## 2015-06-28 MED ORDER — NAPROXEN 500 MG PO TABS: 500.0000 mg | ORAL_TABLET | Freq: Two times a day (BID) | ORAL | Status: DC

## 2015-06-28 MED ORDER — TRAMADOL HCL 50 MG PO TABS: 50.0000 mg | ORAL_TABLET | Freq: Two times a day (BID) | ORAL | Status: DC | PRN

## 2015-06-28 NOTE — Progress Notes (Signed)
Subjective: Chief Complaint  Patient presents with  . Leg Swelling    rt leg. entire leg is swollen, states it is not gout. mucsles in leg are extremly tight. went to ED and they never seen him pt states   Here for knee swelling and pain, last visit here 2014.    Here for right leg swelling per CMA, but when I entered he says right knee hurts.    Been having problem since Friday morning.  Denies injury, fall or trauma.   Been having pain in right knee, been having swelling since Friday.  Feels pain under knee joint, pain in back of knee, feels like leg muscles in the bag stretch and tea.  works as Lobbyistfork lift driver.   Walking and moving the knee hurts.  Can ambulate.  Denies fever.  No hx/o blood clot.   Mother has hx/o blood clot.  Has had some ankle swelling on right.   No calve pain.  Left leg is fine.   Using Aleve, but last used this 3 days ago.  Has had some problems with the knee in the past. No other aggravating or relieving factors. No other complaint.  Past Medical History  Diagnosis Date  . Gout   . Obesity    ROS as in subjective  Objective: BP 156/100 mmHg  Pulse 76  Wt 296 lb (134.265 kg)  Wt Readings from Last 3 Encounters:  06/28/15 296 lb (134.265 kg)  04/02/13 274 lb (124.286 kg)  03/27/13 277 lb (125.646 kg)   BP Readings from Last 3 Encounters:  06/28/15 156/100  06/26/15 144/83  06/24/13 149/107    Gen: wd, wn, nad AA male, obese Skin: unremarkable MSK: mild generalized swelling of right knee, nontender to palpation.  Mild pain with knee flexion and flexion limited to 80 degrees, no laxity, no other deformity, rest of leg exam nontender, and normal ROM 1+ nonpitting LE edema Pulses 2+ bilat UE and LE Heart: RRR, normal S1, S2, no murmurs lungs clear  Assessment: Encounter Diagnoses  Name Primary?  . Right knee pain Yes  . Knee swelling, right   . Elevated blood pressure reading without diagnosis of hypertension   . Impaired fasting blood sugar      Plan: Right knee pain and mild swelling - go for xray, medications as below.    Elevated BP - discussed findings, need to get this under control  He didn't want to discuss this today or talk about medication Impaired glucose 2014.   Return soon for fasting physical, labs, and will likely start BP medication.

## 2015-06-28 NOTE — Patient Instructions (Signed)
Encounter Diagnoses  Name Primary?  . Right knee pain Yes  . Knee swelling, right   . Elevated blood pressure reading without diagnosis of hypertension   . Impaired fasting blood sugar    Recommendations:  Use ice such as a bag of frozen peas 2-3 times daily if possible for pain and swelling  Consider using a knee sleeve or ACE wrap OTC for compression during the day  Begin Naprosyn 500mg  twice daily until you run out of this  If worse pain, you can use Ultram pain medication up to twice daily  Go for xray today  We will call xray results  Return soon for fasting physical and to address your blood pressure  I recommend we start medication for uncontrolled high blood pressure  Hypertension Hypertension, commonly called high blood pressure, is when the force of blood pumping through your arteries is too strong. Your arteries are the blood vessels that carry blood from your heart throughout your body. A blood pressure reading consists of a higher number over a lower number, such as 110/72. The higher number (systolic) is the pressure inside your arteries when your heart pumps. The lower number (diastolic) is the pressure inside your arteries when your heart relaxes. Ideally you want your blood pressure below 120/80. Hypertension forces your heart to work harder to pump blood. Your arteries may become narrow or stiff. Having untreated or uncontrolled hypertension can cause heart attack, stroke, kidney disease, and other problems. RISK FACTORS Some risk factors for high blood pressure are controllable. Others are not.  Risk factors you cannot control include:   Race. You may be at higher risk if you are African American.  Age. Risk increases with age.  Gender. Men are at higher risk than women before age 40 years. After age 40, women are at higher risk than men. Risk factors you can control include:  Not getting enough exercise or physical activity.  Being overweight.  Getting  too much fat, sugar, calories, or salt in your diet.  Drinking too much alcohol. SIGNS AND SYMPTOMS Hypertension does not usually cause signs or symptoms. Extremely high blood pressure (hypertensive crisis) may cause headache, anxiety, shortness of breath, and nosebleed. DIAGNOSIS To check if you have hypertension, your health care provider will measure your blood pressure while you are seated, with your arm held at the level of your heart. It should be measured at least twice using the same arm. Certain conditions can cause a difference in blood pressure between your right and left arms. A blood pressure reading that is higher than normal on one occasion does not mean that you need treatment. If it is not clear whether you have high blood pressure, you may be asked to return on a different day to have your blood pressure checked again. Or, you may be asked to monitor your blood pressure at home for 1 or more weeks. TREATMENT Treating high blood pressure includes making lifestyle changes and possibly taking medicine. Living a healthy lifestyle can help lower high blood pressure. You may need to change some of your habits. Lifestyle changes may include:  Following the DASH diet. This diet is high in fruits, vegetables, and whole grains. It is low in salt, red meat, and added sugars.  Keep your sodium intake below 2,300 mg per day.  Getting at least 30-45 minutes of aerobic exercise at least 4 times per week.  Losing weight if necessary.  Not smoking.  Limiting alcoholic beverages.  Learning ways to reduce  stress. Your health care provider may prescribe medicine if lifestyle changes are not enough to get your blood pressure under control, and if one of the following is true:  You are 63-5 years of age and your systolic blood pressure is above 140.  You are 42 years of age or older, and your systolic blood pressure is above 150.  Your diastolic blood pressure is above 90.  You have  diabetes, and your systolic blood pressure is over 140 or your diastolic blood pressure is over 90.  You have kidney disease and your blood pressure is above 140/90.  You have heart disease and your blood pressure is above 140/90. Your personal target blood pressure may vary depending on your medical conditions, your age, and other factors. HOME CARE INSTRUCTIONS  Have your blood pressure rechecked as directed by your health care provider.   Take medicines only as directed by your health care provider. Follow the directions carefully. Blood pressure medicines must be taken as prescribed. The medicine does not work as well when you skip doses. Skipping doses also puts you at risk for problems.  Do not smoke.   Monitor your blood pressure at home as directed by your health care provider. SEEK MEDICAL CARE IF:   You think you are having a reaction to medicines taken.  You have recurrent headaches or feel dizzy.  You have swelling in your ankles.  You have trouble with your vision. SEEK IMMEDIATE MEDICAL CARE IF:  You develop a severe headache or confusion.  You have unusual weakness, numbness, or feel faint.  You have severe chest or abdominal pain.  You vomit repeatedly.  You have trouble breathing. MAKE SURE YOU:   Understand these instructions.  Will watch your condition.  Will get help right away if you are not doing well or get worse.   This information is not intended to replace advice given to you by your health care provider. Make sure you discuss any questions you have with your health care provider.   Document Released: 04/10/2005 Document Revised: 08/25/2014 Document Reviewed: 01/31/2013 Elsevier Interactive Patient Education 2016 Elsevier Inc.    Knee Pain Knee pain is a very common symptom and can have many causes. Knee pain often goes away when you follow your health care provider's instructions for relieving pain and discomfort at home. However, knee  pain can develop into a condition that needs treatment. Some conditions may include:  Arthritis caused by wear and tear (osteoarthritis).  Arthritis caused by swelling and irritation (rheumatoid arthritis or gout).  A cyst or growth in your knee.  An infection in your knee joint.  An injury that will not heal.  Damage, swelling, or irritation of the tissues that support your knee (torn ligaments or tendinitis). If your knee pain continues, additional tests may be ordered to diagnose your condition. Tests may include X-rays or other imaging studies of your knee. You may also need to have fluid removed from your knee. Treatment for ongoing knee pain depends on the cause, but treatment may include:  Medicines to relieve pain or swelling.  Steroid injections in your knee.  Physical therapy.  Surgery. HOME CARE INSTRUCTIONS  Take medicines only as directed by your health care provider.  Rest your knee and keep it raised (elevated) while you are resting.  Do not do things that cause or worsen pain.  Avoid high-impact activities or exercises, such as running, jumping rope, or doing jumping jacks.  Apply ice to the knee area:  Put ice in a plastic bag.  Place a towel between your skin and the bag.  Leave the ice on for 20 minutes, 2-3 times a day.  Ask your health care provider if you should wear an elastic knee support.  Keep a pillow under your knee when you sleep.  Lose weight if you are overweight. Extra weight can put pressure on your knee.  Do not use any tobacco products, including cigarettes, chewing tobacco, or electronic cigarettes. If you need help quitting, ask your health care provider. Smoking may slow the healing of any bone and joint problems that you may have. SEEK MEDICAL CARE IF:  Your knee pain continues, changes, or gets worse.  You have a fever along with knee pain.  Your knee buckles or locks up.  Your knee becomes more swollen. SEEK IMMEDIATE  MEDICAL CARE IF:   Your knee joint feels hot to the touch.  You have chest pain or trouble breathing.   This information is not intended to replace advice given to you by your health care provider. Make sure you discuss any questions you have with your health care provider.   Document Released: 02/05/2007 Document Revised: 05/01/2014 Document Reviewed: 11/24/2013 Elsevier Interactive Patient Education [employer]

## 2015-06-29 ENCOUNTER — Encounter: Payer: Self-pay | Admitting: Medical

## 2015-06-29 ENCOUNTER — Telehealth: Payer: Self-pay | Admitting: Medical

## 2015-06-29 DIAGNOSIS — E669 Obesity, unspecified: Secondary | ICD-10-CM | POA: Insufficient documentation

## 2015-06-29 DIAGNOSIS — E790 Hyperuricemia without signs of inflammatory arthritis and tophaceous disease: Secondary | ICD-10-CM | POA: Insufficient documentation

## 2015-06-29 NOTE — Telephone Encounter (Signed)
Called pt & advised lab order ready for pick up

## 2015-06-29 NOTE — Addendum Note (Signed)
Addended by: Jac CanavanYSINGER, Chiquetta Langner S on: 06/29/2015 02:14 PM   Modules accepted: Orders

## 2015-06-29 NOTE — Telephone Encounter (Signed)
Pt come in and states that he needs a paper to get labs done a [facility] so he can fasting labs done since they are free there, said he wants to get them done as soon as possible, also states that the tramadol made him sick to his stomach and could not got to work yesterday. York SpanielSaid he took it and the naproxen and the same time, to help with the pain, and he wanted a note since he could  Not go to work, did that per shane,  Pt can be reached at [phone] (M) and to call when the lab paper is ready to he can go get them done.

## 2015-06-29 NOTE — Telephone Encounter (Signed)
I wrote order for labs.  He didn't say anything about labs when he was here, so not sure if he also needs STD panel as I didn't put it on there.  It helps if he asks/discusses prior to just showing up at the lab.  I did ask him to return soon for a physical and labs, but had no knowledge he was going to [facility] the next day.

## 2015-07-19 ENCOUNTER — Ambulatory Visit (INDEPENDENT_AMBULATORY_CARE_PROVIDER_SITE_OTHER): Payer: Managed Care, Other (non HMO) | Admitting: Medical

## 2015-07-19 ENCOUNTER — Encounter: Payer: Self-pay | Admitting: Medical

## 2015-07-19 VITALS — BP 160/102 | HR 89 | Ht 69.0 in | Wt 300.0 lb

## 2015-07-19 DIAGNOSIS — R7301 Impaired fasting glucose: Secondary | ICD-10-CM | POA: Diagnosis not present

## 2015-07-19 DIAGNOSIS — E669 Obesity, unspecified: Secondary | ICD-10-CM | POA: Diagnosis not present

## 2015-07-19 DIAGNOSIS — M1A9XX0 Chronic gout, unspecified, without tophus (tophi): Secondary | ICD-10-CM | POA: Diagnosis not present

## 2015-07-19 DIAGNOSIS — Z0000 Encounter for general adult medical examination without abnormal findings: Secondary | ICD-10-CM | POA: Diagnosis not present

## 2015-07-19 DIAGNOSIS — G4733 Obstructive sleep apnea (adult) (pediatric): Secondary | ICD-10-CM | POA: Insufficient documentation

## 2015-07-19 DIAGNOSIS — M25561 Pain in right knee: Secondary | ICD-10-CM | POA: Diagnosis not present

## 2015-07-19 DIAGNOSIS — R030 Elevated blood-pressure reading, without diagnosis of hypertension: Secondary | ICD-10-CM

## 2015-07-19 DIAGNOSIS — R7989 Other specified abnormal findings of blood chemistry: Secondary | ICD-10-CM

## 2015-07-19 DIAGNOSIS — E790 Hyperuricemia without signs of inflammatory arthritis and tophaceous disease: Secondary | ICD-10-CM

## 2015-07-19 DIAGNOSIS — M25461 Effusion, right knee: Secondary | ICD-10-CM | POA: Diagnosis not present

## 2015-07-19 MED ORDER — TRAMADOL HCL 50 MG PO TABS: 50.0000 mg | ORAL_TABLET | Freq: Two times a day (BID) | ORAL | Status: DC | PRN

## 2015-07-19 MED ORDER — LOSARTAN POTASSIUM 50 MG PO TABS: 50.0000 mg | ORAL_TABLET | Freq: Every day | ORAL | Status: DC

## 2015-07-19 MED ORDER — PHENTERMINE-TOPIRAMATE ER 3.75-23 MG PO CP24: 1.0000 | ORAL_CAPSULE | ORAL | Status: DC

## 2015-07-19 MED ORDER — FEBUXOSTAT 40 MG PO TABS: 40.0000 mg | ORAL_TABLET | Freq: Every day | ORAL | Status: DC

## 2015-07-19 MED ORDER — PHENTERMINE-TOPIRAMATE ER 7.5-46 MG PO CP24: 1.0000 | ORAL_CAPSULE | ORAL | Status: DC

## 2015-07-19 NOTE — Patient Instructions (Signed)
Encounter Diagnoses  Name Primary?  . Routine general medical examination at a health care facility Yes  . Impaired fasting blood sugar   . Elevated blood pressure reading without diagnosis of hypertension   . Elevated uric acid in blood   . Obesity   . Knee swelling, right   . Right knee pain   . Chronic gout without tophus, unspecified cause, unspecified site   . OSA (obstructive sleep apnea)    Recommendations: See your eye doctor yearly for routine vision care. See your dentist yearly for routine dental care including hygiene visits twice yearly. Begin Losartan 50mg  once daily for blood pressure in the morning Begin Uloric 40mg  daily for lowering uric acid and lowering risk of gout Use your CPAP daily!  Let me know your supplier for CPAP and we can have them contact you about a different mask Begin trial of Qsymia oral capsule in the morning daily for lowering appetite and helping with weight loss.   Start the low dose Qsymia daily for 2 weeks, then the regular dose daily.    Try seated hand back, lap swimming or other forms of exercise Try to lose 15 lbs in the next 2 months We will refer you to nutritionist.  You can use OTC Tylenol Arthritis for knee pain or ultram for worse knee pain.  Recheck in 4- 6 week.
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
## 2015-07-21 ENCOUNTER — Telehealth: Payer: Self-pay | Admitting: Medical

## 2015-07-21 MED ORDER — LOSARTAN POTASSIUM 50 MG PO TABS: 50.0000 mg | ORAL_TABLET | Freq: Every day | ORAL | Status: DC

## 2015-07-21 NOTE — Telephone Encounter (Signed)
Pt wife called stating that the uloric needs a prior authorazation and that the losartin needs to be changed to a 30  day supply after this refill was sent to the drug store, and also both needs to be sent to a [facility] mail order, pt can be reached at [phone] (M) with any questions. Thanks

## 2015-07-21 NOTE — Telephone Encounter (Signed)
Did his refills to [facility]

## 2015-07-21 NOTE — Telephone Encounter (Signed)
I will work on Prior Auth if you can handle refills

## 2015-07-23 NOTE — Telephone Encounter (Signed)
Called [facility] and Uloric is not a prior authorization, pt has maxed his retail fills, so he needs to either get it by mail order or call [employer] insurance [employer] as ask to opt out for mail order.  Called pt and left message

## 2015-07-28 MED ORDER — FEBUXOSTAT 40 MG PO TABS: 40.0000 mg | ORAL_TABLET | Freq: Every day | ORAL | Status: DC

## 2015-07-28 NOTE — Telephone Encounter (Signed)
done

## 2015-07-28 NOTE — Telephone Encounter (Signed)
Left another message for pt.  Since unable to reach pt by phone, will you go ahead and switch Uloric to mail order since pt can no longer get this refilled at local pharmacy at this time.

## 2015-07-29 ENCOUNTER — Telehealth: Payer: Self-pay | Admitting: Medical

## 2015-07-29 NOTE — Telephone Encounter (Signed)
Recv'd Prior authorization from [facility] for Uloric even after I was told didn't need a P.A. So completed P.A. For Uloric, this was completed & faxed

## 2015-07-29 NOTE — Telephone Encounter (Signed)
Please call as he was suppose to return for fasting labs.   The labs from 06/2015 visit still show pending.  Check with Alvino ChapelJo to make sure I'm not over looking labs if he in fact did them.

## 2015-07-29 NOTE — Telephone Encounter (Signed)
LMTCB

## 2015-07-30 NOTE — Telephone Encounter (Signed)
P.A. Gibson Rampenied, pt needs trial of Allopurinol, do you want to switch?

## 2015-08-02 ENCOUNTER — Other Ambulatory Visit: Payer: Self-pay | Admitting: Medical

## 2015-08-02 MED ORDER — ALLOPURINOL 100 MG PO TABS: 100.0000 mg | ORAL_TABLET | Freq: Every day | ORAL | Status: DC

## 2015-08-02 NOTE — Telephone Encounter (Signed)
Yes that is ok.  Change to Allopurinol once daily since Uloric requires prior trial of Allopurinol.  Make sure patient is aware.

## 2015-08-02 NOTE — Telephone Encounter (Signed)
Pt stated he already had his labs done at [facility] and already discussed them with you at the last visit. States he didn't see why he needed to do them again

## 2015-08-02 NOTE — Telephone Encounter (Signed)
Pt informed of medication change.

## 2015-08-02 NOTE — Telephone Encounter (Signed)
Left message for pt

## 2015-08-03 NOTE — Telephone Encounter (Signed)
In your folder, any labs and things that Tammy scans are extremely backed up

## 2015-08-03 NOTE — Telephone Encounter (Signed)
Please pull them as they are apparently not scanned in.   Please tell him ignore the message as there are duplicate orders apparently.

## 2015-08-19 ENCOUNTER — Encounter: Payer: Self-pay | Admitting: Medical

## 2015-09-20 ENCOUNTER — Other Ambulatory Visit: Payer: Self-pay | Admitting: Medical

## 2015-10-21 ENCOUNTER — Telehealth: Payer: Self-pay | Admitting: Internal Medicine

## 2015-10-21 NOTE — Telephone Encounter (Signed)
Faxed to [facility]

## 2015-10-21 NOTE — Telephone Encounter (Signed)
Pt's wife called and said that he has a cpap machine but it is about 803-40 years old and needs a replacement machine. Please advise

## 2015-10-21 NOTE — Telephone Encounter (Signed)
You can order new cpap through home health as there is a 2014 sleep stud in chart

## 2015-10-29 ENCOUNTER — Telehealth: Payer: Self-pay | Admitting: Medical

## 2015-10-29 NOTE — Telephone Encounter (Signed)
Answering service called stating that pt's wife called stating that they requested a cpap machine last week & they still have not received a response. She left pt's Phone # of [phone] as a call back #. Tried calling that # several times and call was either not going through or there was not an answer. A fax came in to [facility] today stating that [facility] asking for assistance in contacting pt to sched appt for cpap & supplies since they have also not been able to contact pt. Called wifes # and gave her [facility] phone # since pt was with her & he will call [facility].

## 2016-04-27 ENCOUNTER — Emergency Department (HOSPITAL_COMMUNITY)
Admission: EM | Admit: 2016-04-27 | Discharge: 2016-04-27 | Disposition: A | Discharge status: Home / Self Care | Payer: Commercial Managed Care - PPO | Attending: Dermatology | Admitting: Dermatology

## 2016-04-27 ENCOUNTER — Encounter (HOSPITAL_COMMUNITY): Payer: Self-pay | Admitting: Emergency Medicine

## 2016-04-27 DIAGNOSIS — Z79899 Other long term (current) drug therapy: Secondary | ICD-10-CM | POA: Insufficient documentation

## 2016-04-27 DIAGNOSIS — J111 Influenza due to unidentified influenza virus with other respiratory manifestations: Secondary | ICD-10-CM | POA: Diagnosis not present

## 2016-04-27 DIAGNOSIS — Z87891 Personal history of nicotine dependence: Secondary | ICD-10-CM | POA: Insufficient documentation

## 2016-04-27 DIAGNOSIS — Z5321 Procedure and treatment not carried out due to patient leaving prior to being seen by health care provider: Secondary | ICD-10-CM | POA: Diagnosis not present

## 2016-04-27 NOTE — ED Triage Notes (Signed)
Pt c/o flu like symptoms, fever, chils, body aches, x3 days.

## 2016-04-27 NOTE — ED Triage Notes (Signed)
Pt took robitussin and teraflu

## 2016-08-05 DIAGNOSIS — H52223 Regular astigmatism, bilateral: Secondary | ICD-10-CM | POA: Diagnosis not present

## 2016-08-05 DIAGNOSIS — H5212 Myopia, left eye: Secondary | ICD-10-CM | POA: Diagnosis not present

## 2016-08-23 DIAGNOSIS — H40012 Open angle with borderline findings, low risk, left eye: Secondary | ICD-10-CM | POA: Diagnosis not present

## 2016-08-23 DIAGNOSIS — H25013 Cortical age-related cataract, bilateral: Secondary | ICD-10-CM | POA: Diagnosis not present

## 2016-08-23 DIAGNOSIS — H2513 Age-related nuclear cataract, bilateral: Secondary | ICD-10-CM | POA: Diagnosis not present

## 2016-08-25 DIAGNOSIS — B351 Tinea unguium: Secondary | ICD-10-CM | POA: Diagnosis not present

## 2016-08-25 DIAGNOSIS — R030 Elevated blood-pressure reading, without diagnosis of hypertension: Secondary | ICD-10-CM | POA: Diagnosis not present

## 2016-09-04 ENCOUNTER — Emergency Department (HOSPITAL_COMMUNITY)
Admission: EM | Admit: 2016-09-04 | Discharge: 2016-09-04 | Disposition: A | Discharge status: Home / Self Care | Payer: Commercial Managed Care - PPO | Attending: Emergency Medicine | Admitting: Emergency Medicine

## 2016-09-04 ENCOUNTER — Emergency Department (HOSPITAL_COMMUNITY): Payer: Commercial Managed Care - PPO

## 2016-09-04 ENCOUNTER — Encounter (HOSPITAL_COMMUNITY): Payer: Self-pay | Admitting: Emergency Medicine

## 2016-09-04 DIAGNOSIS — K429 Umbilical hernia without obstruction or gangrene: Secondary | ICD-10-CM | POA: Diagnosis not present

## 2016-09-04 DIAGNOSIS — R0789 Other chest pain: Secondary | ICD-10-CM | POA: Diagnosis not present

## 2016-09-04 DIAGNOSIS — R079 Chest pain, unspecified: Secondary | ICD-10-CM | POA: Diagnosis present

## 2016-09-04 DIAGNOSIS — Z87891 Personal history of nicotine dependence: Secondary | ICD-10-CM | POA: Diagnosis not present

## 2016-09-04 LAB — BASIC METABOLIC PANEL
Anion gap: 8 (ref 5–15)
BUN: 9 mg/dL (ref 6–20)
CO2: 27 mmol/L (ref 22–32)
Calcium: 8.4 mg/dL — ABNORMAL LOW (ref 8.9–10.3)
Chloride: 104 mmol/L (ref 101–111)
Creatinine, Ser: 1.1 mg/dL (ref 0.61–1.24)
GFR calc Af Amer: >60 mL/min (ref >60)
GFR calc non Af Amer: >60 mL/min (ref >60)
Glucose, Bld: 103 mg/dL — ABNORMAL HIGH (ref 65–99)
Potassium: 4 mmol/L (ref 3.5–5.1)
Sodium: 139 mmol/L (ref 135–145)

## 2016-09-04 LAB — CBC
HCT: 41.4 % (ref 39.0–52.0)
Hemoglobin: 13 g/dL (ref 13.0–17.0)
MCH: 26.4 pg (ref 26.0–34.0)
MCHC: 31.4 g/dL (ref 30.0–36.0)
MCV: 84.1 fL (ref 78.0–100.0)
Platelets: 299 10*3/uL (ref 150–400)
RBC: 4.92 MIL/uL (ref 4.22–5.81)
RDW: 14.1 % (ref 11.5–15.5)
WBC: 12.8 10*3/uL — ABNORMAL HIGH (ref 4.0–10.5)

## 2016-09-04 LAB — D-DIMER, QUANTITATIVE: D-Dimer, Quant: <0.27 ug/mL-FEU (ref 0.00–0.50)

## 2016-09-04 LAB — I-STAT TROPONIN, ED
Troponin i, poc: 0 ng/mL (ref 0.00–0.08)
Troponin i, poc: 0 ng/mL (ref 0.00–0.08)

## 2016-09-04 MED ORDER — MORPHINE SULFATE (PF) 4 MG/ML IV SOLN
4.0000 mg | Freq: Once | INTRAVENOUS | Status: AC
Start: 2016-09-04 — End: 2016-09-04
  Administered 2016-09-04: 4 mg via INTRAVENOUS
  Filled 2016-09-04: qty 1

## 2016-09-04 MED ORDER — IOPAMIDOL (ISOVUE-370) INJECTION 76%
INTRAVENOUS | Status: AC
  Administered 2016-09-04: 100 mL via INTRAVENOUS
  Filled 2016-09-04: qty 100

## 2016-09-04 NOTE — ED Provider Notes (Signed)
[facility]-EMERGENCY DEPT Provider Note   CSN: [id number] Arrival date & time: 09/04/16  1541     History   Chief Complaint No chief complaint on file.   HPI Frank Howe is a 41 y.o. male.  HPI Patient presents with chest pain that began upon waking today. Pain radiates to middle of back. Describes it as a "nagging pain." Patient also reports intermittent and waves of nausea but no vomiting. He states that he has never experienced this pain before. Reports he has a history of high blood pressure but is not taking any medications for this. States he has appointment with his new PCP for physical this week.  Patient denies tobacco use, as he states he quit smoking about 2 years ago. Reports occasional alcohol use. Denies chronic NSAID use. Denies prior history of DVT or PE, history of MI, hemoptysis, leg swelling, recent surgery, history of cancer, headache, vision changes, syncope, palpitations, abdominal pain, bowel changes.  Past Medical History:  Diagnosis Date  . Elevated blood pressure reading without diagnosis of hypertension   . Gout   . Impaired fasting blood sugar   . Obesity   . OSA (obstructive sleep apnea)     Patient Active Problem List   Diagnosis Date Noted  . Gout, chronic 07/19/2015  . OSA (obstructive sleep apnea) 07/19/2015  . Obesity 06/29/2015  . Elevated uric acid in blood 06/29/2015  . Impaired fasting blood sugar 06/28/2015  . Knee swelling 06/28/2015  . Right knee pain 06/28/2015  . Elevated blood pressure reading without diagnosis of hypertension 06/28/2015  . Routine general medical examination at a health care facility 08/21/2011    History reviewed. No pertinent surgical history.     Home Medications    Prior to Admission medications   Medication Sig Start Date End Date Taking? Authorizing Provider  Acetaminophen (TYLENOL PO) Take by mouth as needed. Reported on 07/19/2015    [provider]  allopurinol (ZYLOPRIM) 100 MG tablet Take  1 tablet (100 mg total) by mouth daily. 08/02/15   Tysinger, Kermit Baloavid S, PA-C  HYDROcodone-acetaminophen (NORCO/VICODIN) 5-325 MG per tablet 1 to 2 tabs every 4 to 6 hours as needed for pain. Patient not taking: Reported on 06/28/2015 06/24/13   Reuben LikesKeller, David C, MD  losartan (COZAAR) 50 MG tablet Take 1 tablet (50 mg total) by mouth daily. 07/21/15   Tysinger, Kermit Baloavid S, PA-C  Phentermine-Topiramate (QSYMIA) 3.75-23 MG CP24 Take 1 capsule by mouth every morning. 07/19/15   Tysinger, Kermit Baloavid S, PA-C  Phentermine-Topiramate (QSYMIA) 7.5-46 MG CP24 Take 1 capsule by mouth every morning. 07/19/15   Tysinger, Kermit Baloavid S, PA-C  traMADol (ULTRAM) 50 MG tablet Take 1 tablet (50 mg total) by mouth every 12 (twelve) hours as needed. 07/19/15   Tysinger, Kermit Baloavid S, PA-C    Family History Family History  Problem Relation Age of Onset  . Arthritis Mother   . Hypertension Mother   . Stroke Mother   . Aneurysm Mother        died of brain aneurysm  . Asthma Father   . Cancer Father        ?  Marland Kitchen. Arthritis Father   . Heart disease Maternal Uncle   . Diabetes Maternal Uncle   . Alcohol abuse Neg Hx   . Hyperlipidemia Neg Hx   . Hearing loss Neg Hx   . Kidney disease Neg Hx   . Early death Neg Hx     Social History Social History  Substance Use Topics  .  Smoking status: Former Smoker    Packs/day: 0.25    Years: 4.00    Types: Cigarettes    Quit date: 12/29/2014  . Smokeless tobacco: Never Used     Comment: once every 2 wk  . Alcohol use 0.0 oz/week     Allergies   Patient has no known allergies.   Review of Systems Review of Systems  Constitutional: Negative for appetite change, chills and fever.  HENT: Negative for ear pain, rhinorrhea, sneezing and sore throat.   Eyes: Negative for photophobia and visual disturbance.  Respiratory: Negative for cough, chest tightness, shortness of breath and wheezing.   Cardiovascular: Positive for chest pain. Negative for palpitations.  Gastrointestinal: Positive for  nausea. Negative for abdominal pain, blood in stool, constipation, diarrhea and vomiting.  Genitourinary: Negative for dysuria, hematuria and urgency.  Musculoskeletal: Positive for back pain. Negative for myalgias.  Skin: Negative for rash.  Neurological: Negative for dizziness, syncope, weakness, light-headedness and headaches.     Physical Exam Updated Vital Signs BP (!) 151/77 (BP Location: Right Arm)   Pulse 77   Temp 98.7 F (37.1 C) (Oral)   Resp (!) 24   Ht 5\' 10"  (1.778 m)   Wt (!) 137.4 kg   SpO2 95%   BMI 43.48 kg/m   Physical Exam  Constitutional: He appears well-developed and well-nourished. No distress.  HENT:  Head: Normocephalic and atraumatic.  Nose: Nose normal.  Eyes: Conjunctivae and EOM are normal. Right eye exhibits no discharge. Left eye exhibits no discharge. No scleral icterus.  Neck: Normal range of motion. Neck supple.  Cardiovascular: Normal rate, regular rhythm, normal heart sounds and intact distal pulses.  Exam reveals no gallop and no friction rub.   No murmur heard. Pulmonary/Chest: Effort normal and breath sounds normal. No respiratory distress.  Abdominal: Soft. Bowel sounds are normal. He exhibits no distension. There is no tenderness. There is no guarding.  Musculoskeletal: Normal range of motion. He exhibits no edema.  Lower midsternal chest pain.  Neurological: He is alert. He exhibits normal muscle tone. Coordination normal.  Skin: Skin is warm and dry. No rash noted.  Psychiatric: He has a normal mood and affect.  Nursing note and vitals reviewed.    ED Treatments / Results  Labs (all labs ordered are listed, but only abnormal results are displayed) Labs Reviewed  BASIC METABOLIC PANEL - Abnormal; Notable for the following:       Result Value   Glucose, Bld 103 (*)    Calcium 8.4 (*)    All other components within normal limits  CBC - Abnormal; Notable for the following:    WBC 12.8 (*)    All other components within normal  limits  D-DIMER, QUANTITATIVE (NOT AT [facility])  I-STAT TROPOININ, ED  I-STAT TROPOININ, ED    EKG  EKG Interpretation  Date/Time:  Monday Sep 04 2016 16:08:24 EDT Ventricular Rate:  74 PR Interval:    QRS Duration: 87 QT Interval:  371 QTC Calculation: 412 R Axis:   12 Text Interpretation:  Sinus rhythm Left ventricular hypertrophy Minimal ST elevation, inferior leads Baseline wander in lead(s) II III aVR aVL aVF V1 V2 V3 V4 V5 V6 No old tracing to compare Confirmed by BELFI  MD, MELANIE ([id number]) on 09/04/2016 4:15:11 PM       Radiology Dg Chest 2 View  Result Date: 09/04/2016 CLINICAL DATA:  Central chest pains since 6AM today. Never had this chest pain before. Pt takes HTN meds. Not diabetic.  Smoker from age [age]. No hx of asthma. EXAM: CHEST  2 VIEW COMPARISON:  None. FINDINGS: The heart size and mediastinal contours are within normal limits. Both lungs are clear. No pleural effusion or pneumothorax. The visualized skeletal structures are unremarkable. IMPRESSION: No active cardiopulmonary disease. Electronically Signed   By: Amie [place] M.D.   On: 09/04/2016 17:43   Ct Angio Chest/abd/pel For Dissection W And/or Wo Contrast  Result Date: 09/04/2016 CLINICAL DATA:  Acute presentation with central chest pain radiating to the back. Very severe pain. History of hypertension. Assess for dissection. EXAM: CT ANGIOGRAPHY CHEST, ABDOMEN AND PELVIS TECHNIQUE: Multidetector CT imaging through the chest, abdomen and pelvis was performed using the standard protocol during bolus administration of intravenous contrast. Multiplanar reconstructed images and MIPs were obtained and reviewed to evaluate the vascular anatomy. CONTRAST:  100 cc Isovue 370 COMPARISON:  Chest radiography same day FINDINGS: CTA CHEST FINDINGS Cardiovascular: Heart size is normal. No pericardial fluid. No visible coronary artery calcification. The aorta does not show atherosclerotic change. No evidence of dissection or wall  irregularity. Brachiocephalic vessels appear normal. Mediastinum/Nodes: Normal Lungs/Pleura: Normal Musculoskeletal: Ordinary spinal degenerative changes. No acute bone finding. Review of the MIP images confirms the above findings. CTA ABDOMEN AND PELVIS FINDINGS VASCULAR Aorta: Normal. No atherosclerotic change. No aneurysm. No dissection. Opacification is only moderate. Minimal atherosclerotic calcification in the iliac arteries. Celiac: Normal SMA: Normal Renals: Normal IMA: Normal Inflow: Normal Veins: Normal Review of the MIP images confirms the above findings. NON-VASCULAR Hepatobiliary: Question low-density stones in the gallbladder. No sign of gallbladder inflammation by CT. Pancreas: Normal Spleen: Normal Adrenals/Urinary Tract: Adrenal glands are normal. Kidneys are normal. No cyst, mass, stone or hydronephrosis. Stomach/Bowel: Umbilical hernia containing a loop of nonobstructed small intestine. No acute bowel finding. Lymphatic: No retroperitoneal mass or lymphadenopathy. Reproductive: Normal Other: No free fluid or air. Musculoskeletal: Normal Review of the MIP images confirms the above findings. IMPRESSION: Negative for acute vascular pathology. The only vascular abnormality is very minimal atherosclerosis in the iliac arteries. No evidence of dissection in the chest or abdomen. Umbilical hernia containing fat and a loop of non obstructed small intestine. Ordinary degenerative changes of the thoracic spine. That could conceivably be a cause of back pain. Electronically Signed   By: Paulina Fusi M.D.   On: 09/04/2016 19:30    Procedures Procedures (including critical care time)  Medications Ordered in ED Medications  morphine 4 MG/ML injection 4 mg (4 mg Intravenous Given 09/04/16 1924)  iopamidol (ISOVUE-370) 76 % injection (100 mLs Intravenous Contrast Given 09/04/16 1853)     Initial Impression / Assessment and Plan / ED Course  I have reviewed the triage vital signs and the nursing  notes.  Pertinent labs & imaging results that were available during my care of the patient were reviewed by me and considered in my medical decision making (see chart for details).     Patient's history and symptoms warrant surgery for ACS versus PE versus arrhythmia versus pneumonia versus dissection. Troponin, CBC, BMP and d-dimer pending. No labs done in triage. Patient has a HEART score of 3. This warrants obtaining delta troponin. Troponin negative 2. D-dimer negative at this time which causes no concern for PE. No concern for DVT at this time due to absence of leg swelling or calf tenderness.Marland Kitchen cBC showed mild leukocytosis at 12.8. BMP revealed no electrolyte abnormalities and normal kidney function. EKG showed minimal ST elevation of the inferior leads. Chest x-ray was negative for any  acute cardiopulmonary process. CTangioma was done to evaluate for dissection considering that patient has chest pain that radiates to the back, history of tobacco use and uncontrolled hypertension. This returned as negative. Patient is afebrile with no history of fever.Patient achieved pain control here in the ED with morphine. I reassured him of today's negative workup and encouraged him to tell his PCP about today's visit at his upcoming physical appointment. Return precautions given.   Final Clinical Impressions(s) / ED Diagnoses   Final diagnoses:  Chest wall pain    New Prescriptions Discharge Medication List as of 09/04/2016  7:55 PM       Dietrich Pates, PA-C 09/05/16 0222    Tegeler, Canary Brim, MD 09/05/16 1102

## 2016-09-04 NOTE — ED Notes (Addendum)
Pt from home with complaints of central chest pain that radiates to his back. Pt states this pain woke him from his sleep and is 10/10. Pt states the pain is a sharp pain. Pt has hx of htn but has never been medicated for it. Pt denies smoking currently, but has hx of smoking previously. Pt denies SOB. Pt does have nausea that began this morning when his CP began

## 2016-09-04 NOTE — Discharge Instructions (Signed)
Continue taking home medications as previously prescribed, Follow up with PCP for further evaluation if needed. Return to ED for worsening pain, trouble breathing, trouble walking, numbness, weakness or loss of consciousness.

## 2016-09-12 DIAGNOSIS — I10 Essential (primary) hypertension: Secondary | ICD-10-CM | POA: Diagnosis not present

## 2016-09-12 DIAGNOSIS — E119 Type 2 diabetes mellitus without complications: Secondary | ICD-10-CM | POA: Diagnosis not present

## 2016-09-13 DIAGNOSIS — H18469 Peripheral corneal degeneration, unspecified eye: Secondary | ICD-10-CM | POA: Diagnosis not present

## 2016-10-03 DIAGNOSIS — E119 Type 2 diabetes mellitus without complications: Secondary | ICD-10-CM | POA: Diagnosis not present

## 2016-10-03 DIAGNOSIS — M109 Gout, unspecified: Secondary | ICD-10-CM | POA: Diagnosis not present

## 2016-10-03 DIAGNOSIS — I10 Essential (primary) hypertension: Secondary | ICD-10-CM | POA: Diagnosis not present

## 2016-10-06 DIAGNOSIS — M109 Gout, unspecified: Secondary | ICD-10-CM | POA: Diagnosis not present

## 2016-10-06 DIAGNOSIS — E119 Type 2 diabetes mellitus without complications: Secondary | ICD-10-CM | POA: Diagnosis not present

## 2016-10-06 DIAGNOSIS — I10 Essential (primary) hypertension: Secondary | ICD-10-CM | POA: Diagnosis not present

## 2016-11-06 DIAGNOSIS — Z125 Encounter for screening for malignant neoplasm of prostate: Secondary | ICD-10-CM | POA: Diagnosis not present

## 2016-11-06 DIAGNOSIS — M109 Gout, unspecified: Secondary | ICD-10-CM | POA: Diagnosis not present

## 2016-11-06 DIAGNOSIS — Z0000 Encounter for general adult medical examination without abnormal findings: Secondary | ICD-10-CM | POA: Diagnosis not present

## 2016-11-08 DIAGNOSIS — Z0000 Encounter for general adult medical examination without abnormal findings: Secondary | ICD-10-CM | POA: Diagnosis not present

## 2016-11-08 DIAGNOSIS — K429 Umbilical hernia without obstruction or gangrene: Secondary | ICD-10-CM | POA: Diagnosis not present

## 2016-11-08 DIAGNOSIS — I10 Essential (primary) hypertension: Secondary | ICD-10-CM | POA: Diagnosis not present

## 2016-11-08 DIAGNOSIS — Z23 Encounter for immunization: Secondary | ICD-10-CM | POA: Diagnosis not present

## 2016-11-08 DIAGNOSIS — R42 Dizziness and giddiness: Secondary | ICD-10-CM | POA: Diagnosis not present

## 2017-01-10 DIAGNOSIS — M109 Gout, unspecified: Secondary | ICD-10-CM | POA: Diagnosis not present

## 2017-01-10 DIAGNOSIS — I10 Essential (primary) hypertension: Secondary | ICD-10-CM | POA: Diagnosis not present

## 2017-01-12 DIAGNOSIS — Z23 Encounter for immunization: Secondary | ICD-10-CM | POA: Diagnosis not present

## 2017-01-12 DIAGNOSIS — R21 Rash and other nonspecific skin eruption: Secondary | ICD-10-CM | POA: Diagnosis not present

## 2017-01-12 DIAGNOSIS — I10 Essential (primary) hypertension: Secondary | ICD-10-CM | POA: Diagnosis not present

## 2017-01-12 DIAGNOSIS — M109 Gout, unspecified: Secondary | ICD-10-CM | POA: Diagnosis not present

## 2017-03-30 DIAGNOSIS — E119 Type 2 diabetes mellitus without complications: Secondary | ICD-10-CM | POA: Diagnosis not present

## 2017-03-30 DIAGNOSIS — M109 Gout, unspecified: Secondary | ICD-10-CM | POA: Diagnosis not present

## 2017-03-30 DIAGNOSIS — I10 Essential (primary) hypertension: Secondary | ICD-10-CM | POA: Diagnosis not present

## 2017-04-05 DIAGNOSIS — M109 Gout, unspecified: Secondary | ICD-10-CM | POA: Diagnosis not present

## 2017-04-05 DIAGNOSIS — E119 Type 2 diabetes mellitus without complications: Secondary | ICD-10-CM | POA: Diagnosis not present

## 2017-04-05 DIAGNOSIS — I10 Essential (primary) hypertension: Secondary | ICD-10-CM | POA: Diagnosis not present

## 2017-05-16 DIAGNOSIS — Z23 Encounter for immunization: Secondary | ICD-10-CM | POA: Diagnosis not present

## 2017-05-16 DIAGNOSIS — M109 Gout, unspecified: Secondary | ICD-10-CM | POA: Diagnosis not present

## 2017-05-18 DIAGNOSIS — M109 Gout, unspecified: Secondary | ICD-10-CM | POA: Diagnosis not present

## 2017-05-18 DIAGNOSIS — I10 Essential (primary) hypertension: Secondary | ICD-10-CM | POA: Diagnosis not present

## 2017-06-27 DIAGNOSIS — E119 Type 2 diabetes mellitus without complications: Secondary | ICD-10-CM | POA: Diagnosis not present

## 2017-06-27 DIAGNOSIS — I10 Essential (primary) hypertension: Secondary | ICD-10-CM | POA: Diagnosis not present

## 2017-06-27 DIAGNOSIS — M109 Gout, unspecified: Secondary | ICD-10-CM | POA: Diagnosis not present

## 2017-06-29 DIAGNOSIS — E119 Type 2 diabetes mellitus without complications: Secondary | ICD-10-CM | POA: Diagnosis not present

## 2017-06-29 DIAGNOSIS — M109 Gout, unspecified: Secondary | ICD-10-CM | POA: Diagnosis not present

## 2017-06-29 DIAGNOSIS — R030 Elevated blood-pressure reading, without diagnosis of hypertension: Secondary | ICD-10-CM | POA: Diagnosis not present

## 2017-07-09 DIAGNOSIS — H6063 Unspecified chronic otitis externa, bilateral: Secondary | ICD-10-CM | POA: Diagnosis not present

## 2017-07-09 DIAGNOSIS — H6122 Impacted cerumen, left ear: Secondary | ICD-10-CM | POA: Diagnosis not present

## 2017-07-27 DIAGNOSIS — I10 Essential (primary) hypertension: Secondary | ICD-10-CM | POA: Diagnosis not present

## 2017-07-27 DIAGNOSIS — K429 Umbilical hernia without obstruction or gangrene: Secondary | ICD-10-CM | POA: Diagnosis not present

## 2017-09-07 DIAGNOSIS — R030 Elevated blood-pressure reading, without diagnosis of hypertension: Secondary | ICD-10-CM | POA: Diagnosis not present

## 2017-09-07 DIAGNOSIS — K529 Noninfective gastroenteritis and colitis, unspecified: Secondary | ICD-10-CM | POA: Diagnosis not present

## 2017-09-07 DIAGNOSIS — K219 Gastro-esophageal reflux disease without esophagitis: Secondary | ICD-10-CM | POA: Diagnosis not present

## 2017-09-08 ENCOUNTER — Encounter (HOSPITAL_COMMUNITY): Payer: Self-pay | Admitting: Nurse Practitioner

## 2017-09-08 ENCOUNTER — Emergency Department (HOSPITAL_COMMUNITY): Payer: Commercial Managed Care - PPO

## 2017-09-08 ENCOUNTER — Emergency Department (HOSPITAL_COMMUNITY)
Admission: EM | Admit: 2017-09-08 | Discharge: 2017-09-08 | Disposition: A | Discharge status: Home / Self Care | Payer: Commercial Managed Care - PPO | Attending: Emergency Medicine | Admitting: Emergency Medicine

## 2017-09-08 DIAGNOSIS — K56699 Other intestinal obstruction unspecified as to partial versus complete obstruction: Secondary | ICD-10-CM | POA: Diagnosis not present

## 2017-09-08 DIAGNOSIS — E875 Hyperkalemia: Secondary | ICD-10-CM | POA: Insufficient documentation

## 2017-09-08 DIAGNOSIS — R1110 Vomiting, unspecified: Secondary | ICD-10-CM | POA: Diagnosis not present

## 2017-09-08 DIAGNOSIS — K420 Umbilical hernia with obstruction, without gangrene: Secondary | ICD-10-CM | POA: Diagnosis not present

## 2017-09-08 DIAGNOSIS — R0602 Shortness of breath: Secondary | ICD-10-CM | POA: Diagnosis not present

## 2017-09-08 DIAGNOSIS — R197 Diarrhea, unspecified: Secondary | ICD-10-CM | POA: Diagnosis not present

## 2017-09-08 DIAGNOSIS — R000 Tachycardia, unspecified: Secondary | ICD-10-CM | POA: Diagnosis not present

## 2017-09-08 DIAGNOSIS — Z87891 Personal history of nicotine dependence: Secondary | ICD-10-CM | POA: Diagnosis not present

## 2017-09-08 DIAGNOSIS — Z79899 Other long term (current) drug therapy: Secondary | ICD-10-CM | POA: Diagnosis not present

## 2017-09-08 DIAGNOSIS — R109 Unspecified abdominal pain: Secondary | ICD-10-CM | POA: Diagnosis not present

## 2017-09-08 DIAGNOSIS — K56609 Unspecified intestinal obstruction, unspecified as to partial versus complete obstruction: Secondary | ICD-10-CM

## 2017-09-08 DIAGNOSIS — K429 Umbilical hernia without obstruction or gangrene: Secondary | ICD-10-CM | POA: Diagnosis not present

## 2017-09-08 LAB — COMPREHENSIVE METABOLIC PANEL
ALT: 84 U/L — ABNORMAL HIGH (ref 17–63)
AST: 66 U/L — ABNORMAL HIGH (ref 15–41)
Albumin: 3.5 g/dL (ref 3.5–5.0)
Alkaline Phosphatase: 98 U/L (ref 38–126)
Anion gap: 10 (ref 5–15)
BUN: 13 mg/dL (ref 6–20)
CO2: 25 mmol/L (ref 22–32)
Calcium: 8.5 mg/dL — ABNORMAL LOW (ref 8.9–10.3)
Chloride: 103 mmol/L (ref 101–111)
Creatinine, Ser: 1.57 mg/dL — ABNORMAL HIGH (ref 0.61–1.24)
GFR calc Af Amer: >60 mL/min (ref >60)
GFR calc non Af Amer: 53 mL/min — ABNORMAL LOW (ref >60)
Glucose, Bld: 114 mg/dL — ABNORMAL HIGH (ref 65–99)
Potassium: 5.5 mmol/L — ABNORMAL HIGH (ref 3.5–5.1)
Sodium: 138 mmol/L (ref 135–145)
Total Bilirubin: 2.1 mg/dL — ABNORMAL HIGH (ref 0.3–1.2)
Total Protein: 7.8 g/dL (ref 6.5–8.1)

## 2017-09-08 LAB — URINALYSIS, ROUTINE W REFLEX MICROSCOPIC
Bacteria, UA: NONE SEEN
Bilirubin Urine: NEGATIVE
Glucose, UA: NEGATIVE mg/dL
Hgb urine dipstick: NEGATIVE
Ketones, ur: NEGATIVE mg/dL
Leukocytes, UA: NEGATIVE
Nitrite: NEGATIVE
Protein, ur: 30 mg/dL — AB
Specific Gravity, Urine: 1.028 (ref 1.005–1.030)
pH: 5 (ref 5.0–8.0)

## 2017-09-08 LAB — CBC
HCT: 48.3 % (ref 39.0–52.0)
Hemoglobin: 15.8 g/dL (ref 13.0–17.0)
MCH: 27.6 pg (ref 26.0–34.0)
MCHC: 32.7 g/dL (ref 30.0–36.0)
MCV: 84.4 fL (ref 78.0–100.0)
Platelets: 365 10*3/uL (ref 150–400)
RBC: 5.72 MIL/uL (ref 4.22–5.81)
RDW: 14.1 % (ref 11.5–15.5)
WBC: 12.2 10*3/uL — ABNORMAL HIGH (ref 4.0–10.5)

## 2017-09-08 LAB — LIPASE, BLOOD: Lipase: 18 U/L (ref 11–51)

## 2017-09-08 MED ORDER — SODIUM CHLORIDE 0.9 % IV BOLUS
1000.0000 mL | Freq: Once | INTRAVENOUS | Status: AC
  Administered 2017-09-08: 1000 mL via INTRAVENOUS

## 2017-09-08 MED ORDER — IOPAMIDOL (ISOVUE-300) INJECTION 61%
100.0000 mL | Freq: Once | INTRAVENOUS | Status: AC | PRN
  Administered 2017-09-08: 100 mL via INTRAVENOUS

## 2017-09-08 MED ORDER — ONDANSETRON HCL 4 MG/2ML IJ SOLN
4.0000 mg | Freq: Once | INTRAMUSCULAR | Status: AC
  Administered 2017-09-08: 4 mg via INTRAVENOUS
  Filled 2017-09-08: qty 2

## 2017-09-08 MED ORDER — IOPAMIDOL (ISOVUE-300) INJECTION 61%
INTRAVENOUS | Status: AC
  Filled 2017-09-08: qty 100

## 2017-09-08 MED ORDER — HYDRALAZINE HCL 20 MG/ML IJ SOLN
10.0000 mg | INTRAMUSCULAR | Status: AC
  Administered 2017-09-08: 10 mg via INTRAVENOUS
  Filled 2017-09-08: qty 1

## 2017-09-08 MED ORDER — ONDANSETRON 4 MG PO TBDP
4.0000 mg | ORAL_TABLET | Freq: Once | ORAL | Status: AC
  Administered 2017-09-08: 4 mg via ORAL
  Filled 2017-09-08: qty 1

## 2017-09-08 NOTE — ED Notes (Signed)
Pt given lunch tray to see if he can tolerate food.

## 2017-09-08 NOTE — Consult Note (Signed)
Reason for Consult: Nausea vomiting abdominal pain x3 days Referring Physician: Vanita Panda MD  Frank Howe is an 42 y.o. male.  HPI: Patient presents with a 3-day history of nausea, vomiting abdominal pain around the periumbilical region.  Is been constant since Tuesday.  He came in last night with vomiting and was seen this morning in the emergency room.  He has had no nausea or vomiting for the last 8 hours and his abdominal pain is resolved.  CT scan showed an umbilical hernia with a loop of small bowel within it.  Currently, he is asymptomatic and wants to go home.  He drank some apple juice with no nausea or vomiting.  Past Medical History:  Diagnosis Date  . Elevated blood pressure reading without diagnosis of hypertension   . Gout   . Impaired fasting blood sugar   . Obesity   . OSA (obstructive sleep apnea)     History reviewed. No pertinent surgical history.  Family History  Problem Relation Age of Onset  . Arthritis Mother   . Hypertension Mother   . Stroke Mother   . Aneurysm Mother        died of brain aneurysm  . Asthma Father   . Cancer Father        ?  Marland Kitchen Arthritis Father   . Heart disease Maternal Uncle   . Diabetes Maternal Uncle   . Alcohol abuse Neg Hx   . Hyperlipidemia Neg Hx   . Hearing loss Neg Hx   . Kidney disease Neg Hx   . Early death Neg Hx     Social History:  reports that he quit smoking about 2 years ago. His smoking use included cigarettes. He has a 1.00 pack-year smoking history. He has never used smokeless tobacco. He reports that he drinks alcohol. He reports that he does not use drugs.  Allergies: No Known Allergies  Medications: I have reviewed the patient's current medications.  Results for orders placed or performed during the hospital encounter of 09/08/17 (from the past 48 hour(s))  Urinalysis, Routine w reflex microscopic     Status: Abnormal   Collection Time: 09/08/17  3:15 AM  Result Value Ref Range   Color, Urine AMBER (A)  YELLOW    Comment: BIOCHEMICALS MAY BE AFFECTED BY COLOR   APPearance HAZY (A) CLEAR   Specific Gravity, Urine 1.028 1.005 - 1.030   pH 5.0 5.0 - 8.0   Glucose, UA NEGATIVE NEGATIVE mg/dL   Hgb urine dipstick NEGATIVE NEGATIVE   Bilirubin Urine NEGATIVE NEGATIVE   Ketones, ur NEGATIVE NEGATIVE mg/dL   Protein, ur 30 (A) NEGATIVE mg/dL   Nitrite NEGATIVE NEGATIVE   Leukocytes, UA NEGATIVE NEGATIVE   RBC / HPF 0-5 0 - 5 RBC/hpf   WBC, UA 6-10 0 - 5 WBC/hpf   Bacteria, UA NONE SEEN NONE SEEN   Squamous Epithelial / LPF 0-5 0 - 5   Mucus PRESENT    Hyaline Casts, UA PRESENT    Granular Casts, UA PRESENT     Comment: Performed at [facility], [place] [address] [place], [place] [address]  CBC     Status: Abnormal   Collection Time: 09/08/17  8:44 AM  Result Value Ref Range   WBC 12.2 (H) 4.0 - 10.5 K/uL   RBC 5.72 4.22 - 5.81 MIL/uL   Hemoglobin 15.8 13.0 - 17.0 g/dL   HCT 48.3 39.0 - 52.0 %   MCV 84.4 78.0 - 100.0 fL  MCH 27.6 26.0 - 34.0 pg   MCHC 32.7 30.0 - 36.0 g/dL   RDW 14.1 11.5 - 15.5 %   Platelets 365 150 - 400 K/uL    Comment: Performed at [facility], [place] [address] [place], [place] [address]  Comprehensive metabolic panel     Status: Abnormal   Collection Time: 09/08/17  9:33 AM  Result Value Ref Range   Sodium 138 135 - 145 mmol/L   Potassium 5.5 (H) 3.5 - 5.1 mmol/L   Chloride 103 101 - 111 mmol/L   CO2 25 22 - 32 mmol/L   Glucose, Bld 114 (H) 65 - 99 mg/dL   BUN 13 6 - 20 mg/dL   Creatinine, Ser 1.57 (H) 0.61 - 1.24 mg/dL   Calcium 8.5 (L) 8.9 - 10.3 mg/dL   Total Protein 7.8 6.5 - 8.1 g/dL   Albumin 3.5 3.5 - 5.0 g/dL   AST 66 (H) 15 - 41 U/L   ALT 84 (H) 17 - 63 U/L   Alkaline Phosphatase 98 38 - 126 U/L   Total Bilirubin 2.1 (H) 0.3 - 1.2 mg/dL   GFR calc non Af Amer 53 (L) >60 mL/min   GFR calc Af Amer >60 >60 mL/min    Comment: (NOTE) The eGFR has been calculated using the CKD EPI equation. This calculation  has not been validated in all clinical situations. eGFR's persistently <60 mL/min signify possible Chronic Kidney Disease.    Anion gap 10 5 - 15    Comment: Performed at [facility], [place] [address] [place], [place] [address]  Lipase, blood     Status: None   Collection Time: 09/08/17  9:33 AM  Result Value Ref Range   Lipase 18 11 - 51 U/L    Comment: Performed at [facility], [place] [address] [place], [place] [address]    Ct Abdomen Pelvis W Contrast  Result Date: 09/08/2017 CLINICAL DATA:  Severe abdominal pain with vomiting and diarrhea. EXAM: CT ABDOMEN AND PELVIS WITH CONTRAST TECHNIQUE: Multidetector CT imaging of the abdomen and pelvis was performed using the standard protocol following bolus administration of intravenous contrast. CONTRAST:  119m ISOVUE-300 IOPAMIDOL (ISOVUE-300) INJECTION 61% COMPARISON:  None. FINDINGS: Lower chest: No acute abnormality. Hepatobiliary: No focal liver abnormality is seen. No gallstones, gallbladder wall thickening, or biliary dilatation. Pancreas: Unremarkable. No pancreatic ductal dilatation or surrounding inflammatory changes. Spleen: Normal in size without focal abnormality. Adrenals/Urinary Tract: Adrenal glands are unremarkable. Kidneys are normal, without renal calculi, focal lesion, or hydronephrosis. Bladder is decompressed. Stomach/Bowel: The stomach is within normal limits. Multiple dilated loops of proximal small bowel with transition point seen in an umbilical hernia. Fluid-filled colon. Normal appendix. No bowel wall thickening or surrounding inflammatory changes. Vascular/Lymphatic: No significant vascular findings are present. No enlarged abdominal or pelvic lymph nodes. Reproductive: Prostate is unremarkable. Other: No free fluid or pneumoperitoneum. Musculoskeletal: No acute or significant osseous findings. Bilateral hip osteoarthritis. IMPRESSION: 1. Proximal small bowel obstruction with transition  point in an umbilical hernia. Electronically Signed   By: WTitus DubinM.D.   On: 09/08/2017 11:30    Review of Systems  Constitutional: Negative for chills and fever.  HENT: Negative for hearing loss and tinnitus.   Eyes: Negative for blurred vision and double vision.  Respiratory: Negative for cough and hemoptysis.   Cardiovascular: Negative for chest pain and palpitations.  Gastrointestinal: Positive for abdominal pain, nausea and vomiting. Negative for constipation and diarrhea.  Genitourinary: Negative  for dysuria and urgency.  Musculoskeletal: Negative for myalgias and neck pain.  Skin: Negative for itching and rash.  Neurological: Negative for dizziness.  Endo/Heme/Allergies: Negative for environmental allergies. Does not bruise/bleed easily.  Psychiatric/Behavioral: Negative for depression and suicidal ideas.   Blood pressure (!) 185/105, pulse 97, temperature 98.9 F (37.2 C), temperature source Oral, resp. rate 18, SpO2 97 %. Physical Exam  Constitutional: He is oriented to person, place, and time. He appears well-developed and well-nourished.  HENT:  Head: Normocephalic and atraumatic.  Eyes: Pupils are equal, round, and reactive to light. EOM are normal.  Neck: Normal range of motion. Neck supple.  Cardiovascular: Normal rate and regular rhythm.  Respiratory: Effort normal and breath sounds normal.  GI: He exhibits no distension. There is no tenderness. There is no rebound and no guarding. A hernia is present.    Musculoskeletal: Normal range of motion.  Neurological: He is alert and oriented to person, place, and time.  Skin: Skin is warm and dry.  Psychiatric: He has a normal mood and affect. His behavior is normal.    Assessment/Plan: Reducible umbilical hernia  CT evidence of small bowel obstruction.  This originates in the hernia but the hernia is reducible.  He has not had any further nausea or vomiting for the last 8 hours and denies abdominal pain.  I  offered admission for further work-up and/or repair but he declined.  I told him he will need to be able to eat without nausea and vomiting before he will be discharged by the emergency room physician.  If he can eat and has no abdominal pain, he may follow-up as an outpatient for elective repair.  If he does not tolerate his diet, he will need to be admitted but currently he is reducible and is no signs of peritonitis or other complicating issue and is asymptomatic.  I suspect this reduced spontaneously or with repeated physical examinations but currently it is reducible soft and nontender.  He could have adhesions to the hernia sac that may cause intermittent mild partial obstruction as well.  He is aware that his condition may worsen if he goes home but he was clear to me that he wished to be discharged.  Salaam Battershell A Vestal Markin 09/08/2017, 1:23 PM

## 2017-09-08 NOTE — Discharge Instructions (Addendum)
As discussed, it is important that you monitor your condition carefully, and do not hesitate to return here for any concerning changes in your condition. Otherwise, please be sure to follow-up with our surgical colleagues.

## 2017-09-08 NOTE — ED Notes (Signed)
To CT

## 2017-09-08 NOTE — ED Provider Notes (Signed)
[facility]-EMERGENCY DEPT Provider Note   CSN: [id number] Arrival date & time: 09/08/17  0142     History   Chief Complaint Chief Complaint  Patient presents with  . Abdominal Pain  . Emesis  . Diarrhea    HPI Frank Howe is a 42 y.o. male.  HPI Patient presents with concern of abdominal pain, nausea, vomiting, diarrhea. Onset was about 4 days ago, and symptoms have become worse over the past 2 or 3 days. He has multiple medical issues including diabetes, umbilical hernia. He notes that during this illness he has had more pain about his umbilicus, than anywhere else, though he has diffuse sore abdominal pain. Pain is severe, and he has not been tolerant of medication for relief. No associated fever, no dyspnea.  Past Medical History:  Diagnosis Date  . Elevated blood pressure reading without diagnosis of hypertension   . Gout   . Impaired fasting blood sugar   . Obesity   . OSA (obstructive sleep apnea)     Patient Active Problem List   Diagnosis Date Noted  . Gout, chronic 07/19/2015  . OSA (obstructive sleep apnea) 07/19/2015  . Obesity 06/29/2015  . Elevated uric acid in blood 06/29/2015  . Impaired fasting blood sugar 06/28/2015  . Knee swelling 06/28/2015  . Right knee pain 06/28/2015  . Elevated blood pressure reading without diagnosis of hypertension 06/28/2015  . Routine general medical examination at a health care facility 08/21/2011    History reviewed. No pertinent surgical history.      Home Medications    Prior to Admission medications   Medication Sig Start Date End Date Taking? Authorizing Provider  colchicine 0.6 MG tablet Take 0.6 mg by mouth 2 (two) times daily.   Yes [provider]  allopurinol (ZYLOPRIM) 100 MG tablet Take 1 tablet (100 mg total) by mouth daily. Patient not taking: Reported on 09/08/2017 08/02/15   Tysinger, Kermit Balo, PA-C  amLODipine (NORVASC) 10 MG tablet Take 10 mg by mouth daily.  09/07/17   [provider]  HYDROcodone-acetaminophen (NORCO/VICODIN) 5-325 MG per tablet 1 to 2 tabs every 4 to 6 hours as needed for pain. Patient not taking: Reported on 06/28/2015 06/24/13   Reuben Likes, MD  losartan (COZAAR) 50 MG tablet Take 1 tablet (50 mg total) by mouth daily. Patient not taking: Reported on 09/08/2017 07/21/15   Tysinger, Kermit Balo, PA-C  metoprolol succinate (TOPROL-XL) 25 MG 24 hr tablet Take 25 mg by mouth daily. 09/07/17   [provider]  Phentermine-Topiramate (QSYMIA) 3.75-23 MG CP24 Take 1 capsule by mouth every morning. Patient not taking: Reported on 09/08/2017 07/19/15   Tysinger, Kermit Balo, PA-C  Phentermine-Topiramate (QSYMIA) 7.5-46 MG CP24 Take 1 capsule by mouth every morning. Patient not taking: Reported on 09/08/2017 07/19/15   Tysinger, Kermit Balo, PA-C  traMADol (ULTRAM) 50 MG tablet Take 1 tablet (50 mg total) by mouth every 12 (twelve) hours as needed. Patient not taking: Reported on 09/08/2017 07/19/15   Tysinger, Kermit Balo, PA-C    Family History Family History  Problem Relation Age of Onset  . Arthritis Mother   . Hypertension Mother   . Stroke Mother   . Aneurysm Mother        died of brain aneurysm  . Asthma Father   . Cancer Father        ?  Marland Kitchen Arthritis Father   . Heart disease Maternal Uncle   . Diabetes Maternal Uncle   .  Alcohol abuse Neg Hx   . Hyperlipidemia Neg Hx   . Hearing loss Neg Hx   . Kidney disease Neg Hx   . Early death Neg Hx     Social History Social History   Tobacco Use  . Smoking status: Former Smoker    Packs/day: 0.25    Years: 4.00    Pack years: 1.00    Types: Cigarettes    Last attempt to quit: 12/29/2014    Years since quitting: 2.6  . Smokeless tobacco: Never Used  . Tobacco comment: once every 2 wk  Substance Use Topics  . Alcohol use: Yes    Alcohol/week: 0.0 oz  . Drug use: No     Allergies   Patient has no known allergies.   Review of Systems Review of Systems    Constitutional:       Per HPI, otherwise negative  HENT:       Per HPI, otherwise negative  Respiratory:       Per HPI, otherwise negative  Cardiovascular:       Per HPI, otherwise negative  Gastrointestinal: Positive for abdominal pain, diarrhea, nausea and vomiting.  Endocrine:       Negative aside from HPI  Genitourinary:       Neg aside from HPI   Musculoskeletal:       Per HPI, otherwise negative  Skin: Negative.   Neurological: Negative for syncope.     Physical Exam Updated Vital Signs BP (!) 185/105   Pulse 97   Temp 98.9 F (37.2 C) (Oral)   Resp 18   SpO2 97%   Physical Exam  Constitutional: He is oriented to person, place, and time. He appears well-developed. No distress.  HENT:  Head: Normocephalic and atraumatic.  Eyes: Conjunctivae and EOM are normal.  Cardiovascular: Normal rate and regular rhythm.  Pulmonary/Chest: Effort normal. No stridor. No respiratory distress.  Abdominal: He exhibits no distension.  Protuberant abdomen with appreciable umbilical hernia, soft. Diffuse tenderness.  Musculoskeletal: He exhibits no edema.  Neurological: He is alert and oriented to person, place, and time.  Skin: Skin is warm and dry.  Psychiatric: He has a normal mood and affect.  Nursing note and vitals reviewed.    ED Treatments / Results  Labs (all labs ordered are listed, but only abnormal results are displayed) Labs Reviewed  CBC - Abnormal; Notable for the following components:      Result Value   WBC 12.2 (*)    All other components within normal limits  URINALYSIS, ROUTINE W REFLEX MICROSCOPIC - Abnormal; Notable for the following components:   Color, Urine AMBER (*)    APPearance HAZY (*)    Protein, ur 30 (*)    All other components within normal limits  COMPREHENSIVE METABOLIC PANEL - Abnormal; Notable for the following components:   Potassium 5.5 (*)    Glucose, Bld 114 (*)    Creatinine, Ser 1.57 (*)    Calcium 8.5 (*)    AST 66 (*)     ALT 84 (*)    Total Bilirubin 2.1 (*)    GFR calc non Af Amer 53 (*)    All other components within normal limits  LIPASE, BLOOD    EKG EKG Interpretation  Date/Time:  Saturday Sep 08 2017 12:42:53 EDT Ventricular Rate:  101 PR Interval:    QRS Duration: 87 QT Interval:  354 QTC Calculation: 459 R Axis:   8 Text Interpretation:  Sinus tachycardia Abnormal R-wave progression,  early transition Borderline T wave abnormalities Abnormal ekg Confirmed by Gerhard Munch [id number]) on 09/08/2017 1:38:16 PM   Radiology Ct Abdomen Pelvis W Contrast  Result Date: 09/08/2017 CLINICAL DATA:  Severe abdominal pain with vomiting and diarrhea. EXAM: CT ABDOMEN AND PELVIS WITH CONTRAST TECHNIQUE: Multidetector CT imaging of the abdomen and pelvis was performed using the standard protocol following bolus administration of intravenous contrast. CONTRAST:  ISOVUE-300 IOPAMIDOL (ISOVUE-300) INJECTION 61% COMPARISON:  None. FINDINGS: Lower chest: No acute abnormality. Hepatobiliary: No focal liver abnormality is seen. No gallstones, gallbladder wall thickening, or biliary dilatation. Pancreas: Unremarkable. No pancreatic ductal dilatation or surrounding inflammatory changes. Spleen: Normal in size without focal abnormality. Adrenals/Urinary Tract: Adrenal glands are unremarkable. Kidneys are normal, without renal calculi, focal lesion, or hydronephrosis. Bladder is decompressed. Stomach/Bowel: The stomach is within normal limits. Multiple dilated loops of proximal small bowel with transition point seen in an umbilical hernia. Fluid-filled colon. Normal appendix. No bowel wall thickening or surrounding inflammatory changes. Vascular/Lymphatic: No significant vascular findings are present. No enlarged abdominal or pelvic lymph nodes. Reproductive: Prostate is unremarkable. Other: No free fluid or pneumoperitoneum. Musculoskeletal: No acute or significant osseous findings. Bilateral hip osteoarthritis. IMPRESSION:  1. Proximal small bowel obstruction with transition point in an umbilical hernia. Electronically Signed   By: Obie Dredge M.D.   On: 09/08/2017 11:30    Procedures Procedures (including critical care time)  Medications Ordered in ED Medications  ondansetron (ZOFRAN-ODT) disintegrating tablet 4 mg (4 mg Oral Given 09/08/17 0318)  ondansetron (ZOFRAN) injection 4 mg (4 mg Intravenous Given 09/08/17 0850)  sodium chloride 0.9 % bolus 1,000 mL (0 mLs Intravenous Stopped 09/08/17 1002)  hydrALAZINE (APRESOLINE) injection 10 mg (10 mg Intravenous Given 09/08/17 0912)  iopamidol (ISOVUE-300) 61 % injection 100 mL (100 mLs Intravenous Contrast Given 09/08/17 1105)  sodium chloride 0.9 % bolus 1,000 mL (1,000 mLs Intravenous New Bag/Given 09/08/17 1238)     Initial Impression / Assessment and Plan / ED Course  I have reviewed the triage vital signs and the nursing notes.  Pertinent labs & imaging results that were available during my care of the patient were reviewed by me and considered in my medical decision making (see chart for details).     After reviewing CT scan, with evidence for bowel obstruction I informed the patient, and discussed his case with our surgical colleague, Dr. Luisa Hart. Patient's labs notable for elevated creatinine, hyperkalemia  Update: Patient has been seen by Dr. Luisa Hart. Patient has a strong preference for discharge, and hernia is reducible. Patient will attempt p.o. trial, and if he passes, he may follow-up closely in the office.  EKG without notable T wave changes.  Patient has received 2 L fluid resuscitation,   2:58 PM I discussed the patient's findings, importance of following up with surgery with him and his wife. The patient did have a CT evidence for bowel obstruction, he has since tolerated oral intake, and requested discharge with close outpatient follow-up. Patient also has some early creatinine, hyperkalemia likely due to his persistent nausea,  vomiting, dehydration After fluid resuscitation, with unremarkable vital signs, and his tolerance of oral intake, per his request, he was discharged to follow-up in the clinic. Final Clinical Impressions(s) / ED Diagnoses   Final diagnoses:  SBO (small bowel obstruction) (HCC)  Hernia, umbilical, with obstruction  Hyperkalemia   Gerhard Munch, MD 09/08/17 1500

## 2017-09-08 NOTE — ED Triage Notes (Signed)
Pt presents actively vomiting, c/o severe abdominal pain and diarrhea that has been ongoing for the last 4 days.

## 2017-09-08 NOTE — ED Notes (Signed)
CMP recollected and sent to lab.  

## 2017-09-12 ENCOUNTER — Encounter (HOSPITAL_COMMUNITY): Payer: Self-pay | Admitting: Emergency Medicine

## 2017-09-12 ENCOUNTER — Inpatient Hospital Stay (HOSPITAL_COMMUNITY)
Admission: EM | Admit: 2017-09-12 | Discharge: 2017-09-16 | DRG: 354 | Disposition: A | Discharge status: Home / Self Care | Payer: Commercial Managed Care - PPO | Attending: General Surgery | Admitting: General Surgery

## 2017-09-12 ENCOUNTER — Other Ambulatory Visit: Payer: Self-pay

## 2017-09-12 DIAGNOSIS — E860 Dehydration: Secondary | ICD-10-CM | POA: Diagnosis present

## 2017-09-12 DIAGNOSIS — I10 Essential (primary) hypertension: Secondary | ICD-10-CM | POA: Diagnosis present

## 2017-09-12 DIAGNOSIS — N289 Disorder of kidney and ureter, unspecified: Secondary | ICD-10-CM | POA: Diagnosis present

## 2017-09-12 DIAGNOSIS — K567 Ileus, unspecified: Secondary | ICD-10-CM | POA: Diagnosis not present

## 2017-09-12 DIAGNOSIS — Z8249 Family history of ischemic heart disease and other diseases of the circulatory system: Secondary | ICD-10-CM

## 2017-09-12 DIAGNOSIS — K420 Umbilical hernia with obstruction, without gangrene: Secondary | ICD-10-CM | POA: Diagnosis not present

## 2017-09-12 DIAGNOSIS — R109 Unspecified abdominal pain: Secondary | ICD-10-CM | POA: Diagnosis not present

## 2017-09-12 DIAGNOSIS — K9189 Other postprocedural complications and disorders of digestive system: Secondary | ICD-10-CM

## 2017-09-12 DIAGNOSIS — Z79899 Other long term (current) drug therapy: Secondary | ICD-10-CM

## 2017-09-12 DIAGNOSIS — Z87891 Personal history of nicotine dependence: Secondary | ICD-10-CM

## 2017-09-12 DIAGNOSIS — Z6841 Body Mass Index (BMI) 40.0 and over, adult: Secondary | ICD-10-CM

## 2017-09-12 DIAGNOSIS — Z8261 Family history of arthritis: Secondary | ICD-10-CM

## 2017-09-12 DIAGNOSIS — K56600 Partial intestinal obstruction, unspecified as to cause: Secondary | ICD-10-CM | POA: Diagnosis not present

## 2017-09-12 DIAGNOSIS — K56609 Unspecified intestinal obstruction, unspecified as to partial versus complete obstruction: Secondary | ICD-10-CM

## 2017-09-12 DIAGNOSIS — G4733 Obstructive sleep apnea (adult) (pediatric): Secondary | ICD-10-CM | POA: Diagnosis present

## 2017-09-12 DIAGNOSIS — K56699 Other intestinal obstruction unspecified as to partial versus complete obstruction: Secondary | ICD-10-CM | POA: Diagnosis not present

## 2017-09-12 DIAGNOSIS — K429 Umbilical hernia without obstruction or gangrene: Secondary | ICD-10-CM | POA: Diagnosis not present

## 2017-09-12 DIAGNOSIS — R112 Nausea with vomiting, unspecified: Secondary | ICD-10-CM | POA: Diagnosis not present

## 2017-09-12 DIAGNOSIS — K469 Unspecified abdominal hernia without obstruction or gangrene: Secondary | ICD-10-CM | POA: Diagnosis not present

## 2017-09-12 DIAGNOSIS — K660 Peritoneal adhesions (postprocedural) (postinfection): Secondary | ICD-10-CM | POA: Diagnosis present

## 2017-09-12 DIAGNOSIS — M1A9XX0 Chronic gout, unspecified, without tophus (tophi): Secondary | ICD-10-CM | POA: Diagnosis present

## 2017-09-12 DIAGNOSIS — D72829 Elevated white blood cell count, unspecified: Secondary | ICD-10-CM | POA: Diagnosis present

## 2017-09-12 DIAGNOSIS — R000 Tachycardia, unspecified: Secondary | ICD-10-CM | POA: Diagnosis present

## 2017-09-12 DIAGNOSIS — Z833 Family history of diabetes mellitus: Secondary | ICD-10-CM

## 2017-09-12 DIAGNOSIS — K0889 Other specified disorders of teeth and supporting structures: Secondary | ICD-10-CM | POA: Diagnosis present

## 2017-09-12 DIAGNOSIS — Z823 Family history of stroke: Secondary | ICD-10-CM

## 2017-09-12 LAB — COMPREHENSIVE METABOLIC PANEL
ALT: 66 U/L — ABNORMAL HIGH (ref 17–63)
AST: 38 U/L (ref 15–41)
Albumin: 3.8 g/dL (ref 3.5–5.0)
Alkaline Phosphatase: 114 U/L (ref 38–126)
Anion gap: 14 (ref 5–15)
BUN: 15 mg/dL (ref 6–20)
CO2: 22 mmol/L (ref 22–32)
Calcium: 9.1 mg/dL (ref 8.9–10.3)
Chloride: 104 mmol/L (ref 101–111)
Creatinine, Ser: 1.44 mg/dL — ABNORMAL HIGH (ref 0.61–1.24)
GFR calc Af Amer: >60 mL/min (ref >60)
GFR calc non Af Amer: 59 mL/min — ABNORMAL LOW (ref >60)
Glucose, Bld: 136 mg/dL — ABNORMAL HIGH (ref 65–99)
Potassium: 3.6 mmol/L (ref 3.5–5.1)
Sodium: 140 mmol/L (ref 135–145)
Total Bilirubin: 0.6 mg/dL (ref 0.3–1.2)
Total Protein: 8.7 g/dL — ABNORMAL HIGH (ref 6.5–8.1)

## 2017-09-12 LAB — CBC
HCT: 45.7 % (ref 39.0–52.0)
Hemoglobin: 15.2 g/dL (ref 13.0–17.0)
MCH: 27.7 pg (ref 26.0–34.0)
MCHC: 33.3 g/dL (ref 30.0–36.0)
MCV: 83.4 fL (ref 78.0–100.0)
Platelets: 331 10*3/uL (ref 150–400)
RBC: 5.48 MIL/uL (ref 4.22–5.81)
RDW: 13.9 % (ref 11.5–15.5)
WBC: 13.1 10*3/uL — ABNORMAL HIGH (ref 4.0–10.5)

## 2017-09-12 LAB — LIPASE, BLOOD: Lipase: 26 U/L (ref 11–51)

## 2017-09-12 NOTE — ED Triage Notes (Signed)
Pt states that he was here on Saturday and dx with a bowel obstruction.  Pt states that he was told to return if his vomiting returned.  Started vomiting about 2 hours ago.  C/o RLQ pain.

## 2017-09-13 ENCOUNTER — Emergency Department (HOSPITAL_COMMUNITY): Payer: Commercial Managed Care - PPO

## 2017-09-13 ENCOUNTER — Inpatient Hospital Stay (HOSPITAL_COMMUNITY): Payer: Commercial Managed Care - PPO

## 2017-09-13 ENCOUNTER — Encounter (HOSPITAL_COMMUNITY): Payer: Self-pay | Admitting: General Surgery

## 2017-09-13 DIAGNOSIS — K0889 Other specified disorders of teeth and supporting structures: Secondary | ICD-10-CM | POA: Diagnosis present

## 2017-09-13 DIAGNOSIS — R000 Tachycardia, unspecified: Secondary | ICD-10-CM | POA: Diagnosis present

## 2017-09-13 DIAGNOSIS — K420 Umbilical hernia with obstruction, without gangrene: Secondary | ICD-10-CM | POA: Diagnosis present

## 2017-09-13 DIAGNOSIS — K469 Unspecified abdominal hernia without obstruction or gangrene: Secondary | ICD-10-CM | POA: Diagnosis not present

## 2017-09-13 DIAGNOSIS — Z87891 Personal history of nicotine dependence: Secondary | ICD-10-CM | POA: Diagnosis not present

## 2017-09-13 DIAGNOSIS — Z8261 Family history of arthritis: Secondary | ICD-10-CM | POA: Diagnosis not present

## 2017-09-13 DIAGNOSIS — Z823 Family history of stroke: Secondary | ICD-10-CM | POA: Diagnosis not present

## 2017-09-13 DIAGNOSIS — Z8249 Family history of ischemic heart disease and other diseases of the circulatory system: Secondary | ICD-10-CM | POA: Diagnosis not present

## 2017-09-13 DIAGNOSIS — I10 Essential (primary) hypertension: Secondary | ICD-10-CM | POA: Diagnosis present

## 2017-09-13 DIAGNOSIS — K429 Umbilical hernia without obstruction or gangrene: Secondary | ICD-10-CM | POA: Diagnosis not present

## 2017-09-13 DIAGNOSIS — K56600 Partial intestinal obstruction, unspecified as to cause: Secondary | ICD-10-CM | POA: Diagnosis not present

## 2017-09-13 DIAGNOSIS — Z6841 Body Mass Index (BMI) 40.0 and over, adult: Secondary | ICD-10-CM | POA: Diagnosis not present

## 2017-09-13 DIAGNOSIS — K660 Peritoneal adhesions (postprocedural) (postinfection): Secondary | ICD-10-CM | POA: Diagnosis present

## 2017-09-13 DIAGNOSIS — K56609 Unspecified intestinal obstruction, unspecified as to partial versus complete obstruction: Secondary | ICD-10-CM | POA: Diagnosis present

## 2017-09-13 DIAGNOSIS — K567 Ileus, unspecified: Secondary | ICD-10-CM | POA: Diagnosis not present

## 2017-09-13 DIAGNOSIS — G4733 Obstructive sleep apnea (adult) (pediatric): Secondary | ICD-10-CM | POA: Diagnosis present

## 2017-09-13 DIAGNOSIS — Z79899 Other long term (current) drug therapy: Secondary | ICD-10-CM | POA: Diagnosis not present

## 2017-09-13 DIAGNOSIS — E860 Dehydration: Secondary | ICD-10-CM | POA: Diagnosis present

## 2017-09-13 DIAGNOSIS — N289 Disorder of kidney and ureter, unspecified: Secondary | ICD-10-CM | POA: Diagnosis present

## 2017-09-13 DIAGNOSIS — D72829 Elevated white blood cell count, unspecified: Secondary | ICD-10-CM | POA: Diagnosis present

## 2017-09-13 DIAGNOSIS — Z833 Family history of diabetes mellitus: Secondary | ICD-10-CM | POA: Diagnosis not present

## 2017-09-13 DIAGNOSIS — M1A9XX0 Chronic gout, unspecified, without tophus (tophi): Secondary | ICD-10-CM | POA: Diagnosis present

## 2017-09-13 LAB — URINALYSIS, ROUTINE W REFLEX MICROSCOPIC
Bilirubin Urine: NEGATIVE
Glucose, UA: NEGATIVE mg/dL
Hgb urine dipstick: NEGATIVE
Ketones, ur: NEGATIVE mg/dL
Leukocytes, UA: NEGATIVE
Nitrite: NEGATIVE
Protein, ur: 30 mg/dL — AB
Specific Gravity, Urine: 1.03 (ref 1.005–1.030)
pH: 5 (ref 5.0–8.0)

## 2017-09-13 LAB — SURGICAL PCR SCREEN
MRSA, PCR: NEGATIVE
Staphylococcus aureus: NEGATIVE

## 2017-09-13 MED ORDER — CEFAZOLIN SODIUM-DEXTROSE 1-4 GM/50ML-% IV SOLN
1.0000 g | Freq: Once | INTRAVENOUS | Status: AC
  Administered 2017-09-13: 1 g via INTRAVENOUS
  Filled 2017-09-13: qty 50

## 2017-09-13 MED ORDER — ONDANSETRON HCL 4 MG/2ML IJ SOLN: 4.0000 mg | Freq: Four times a day (QID) | INTRAMUSCULAR | Status: DC | PRN

## 2017-09-13 MED ORDER — DIPHENHYDRAMINE HCL 25 MG PO CAPS: 25.0000 mg | ORAL_CAPSULE | Freq: Four times a day (QID) | ORAL | Status: DC | PRN

## 2017-09-13 MED ORDER — KCL IN DEXTROSE-NACL 20-5-0.45 MEQ/L-%-% IV SOLN
INTRAVENOUS | Status: DC
  Administered 2017-09-13 – 2017-09-14 (×3): via INTRAVENOUS
  Filled 2017-09-13 (×4): qty 1000

## 2017-09-13 MED ORDER — HYDRALAZINE HCL 20 MG/ML IJ SOLN: 10.0000 mg | INTRAMUSCULAR | Status: DC | PRN

## 2017-09-13 MED ORDER — ONDANSETRON 4 MG PO TBDP: 4.0000 mg | ORAL_TABLET | Freq: Four times a day (QID) | ORAL | Status: DC | PRN

## 2017-09-13 MED ORDER — METOPROLOL TARTRATE 5 MG/5ML IV SOLN: 5.0000 mg | Freq: Four times a day (QID) | INTRAVENOUS | Status: DC | PRN

## 2017-09-13 MED ORDER — PHENOL 1.4 % MT LIQD
1.0000 | OROMUCOSAL | Status: DC | PRN
  Administered 2017-09-13: 1 via OROMUCOSAL
  Filled 2017-09-13 (×2): qty 177

## 2017-09-13 MED ORDER — DIPHENHYDRAMINE HCL 50 MG/ML IJ SOLN: 25.0000 mg | Freq: Four times a day (QID) | INTRAMUSCULAR | Status: DC | PRN

## 2017-09-13 MED ORDER — ENOXAPARIN SODIUM 40 MG/0.4ML ~~LOC~~ SOLN
40.0000 mg | SUBCUTANEOUS | Status: DC
  Administered 2017-09-13: 40 mg via SUBCUTANEOUS
  Filled 2017-09-13: qty 0.4

## 2017-09-13 MED ORDER — FAMOTIDINE IN NACL 20-0.9 MG/50ML-% IV SOLN
20.0000 mg | INTRAVENOUS | Status: DC
  Administered 2017-09-13: 20 mg via INTRAVENOUS
  Filled 2017-09-13: qty 50

## 2017-09-13 MED ORDER — MENTHOL 3 MG MT LOZG
1.0000 | LOZENGE | OROMUCOSAL | Status: DC | PRN
  Administered 2017-09-13 (×2): 3 mg via ORAL
  Filled 2017-09-13 (×2): qty 9

## 2017-09-13 MED ORDER — MORPHINE SULFATE (PF) 2 MG/ML IV SOLN
1.0000 mg | INTRAVENOUS | Status: DC | PRN
  Administered 2017-09-14: 2 mg via INTRAVENOUS
  Administered 2017-09-14: 1 mg via INTRAVENOUS
  Filled 2017-09-13 (×3): qty 1

## 2017-09-13 NOTE — ED Provider Notes (Signed)
[facility]-EMERGENCY DEPT Provider Note   CSN: [id number] Arrival date & time: 09/12/17  2056     History   Chief Complaint Chief Complaint  Patient presents with  . Abdominal Pain    HPI Frank Howe is a 42 y.o. male.  Patient presents with complaint of vomiting, and abdominal pain since yesterday. He was seen 5/18.19 here for same and diagnosed with SBO related to umbilical hernia. He reports his pain resolved and he was able to tolerate PO fluids as well as have a bowel movement and so was discharged home. He states he was asymptomatic from that time until yesterday when his symptoms of SBO recurred. No fever, chest pain, SoB, urinary complaints.   The history is provided by the patient. No language interpreter was used.  Abdominal Pain   Associated symptoms include nausea and vomiting. Pertinent negatives include fever.    Past Medical History:  Diagnosis Date  . Elevated blood pressure reading without diagnosis of hypertension   . Gout   . Impaired fasting blood sugar   . Obesity   . OSA (obstructive sleep apnea)     Patient Active Problem List   Diagnosis Date Noted  . Gout, chronic 07/19/2015  . OSA (obstructive sleep apnea) 07/19/2015  . Obesity 06/29/2015  . Elevated uric acid in blood 06/29/2015  . Impaired fasting blood sugar 06/28/2015  . Knee swelling 06/28/2015  . Right knee pain 06/28/2015  . Elevated blood pressure reading without diagnosis of hypertension 06/28/2015  . Routine general medical examination at a health care facility 08/21/2011    History reviewed. No pertinent surgical history.      Home Medications    Prior to Admission medications   Medication Sig Start Date End Date Taking? Authorizing Provider  allopurinol (ZYLOPRIM) 100 MG tablet Take 1 tablet (100 mg total) by mouth daily. Patient not taking: Reported on 09/08/2017 08/02/15   Tysinger, Kermit Balo, PA-C  amLODipine (NORVASC) 10 MG tablet Take 10 mg by  mouth daily. 09/07/17   [provider]  colchicine 0.6 MG tablet Take 0.6 mg by mouth 2 (two) times daily.    [provider]  HYDROcodone-acetaminophen (NORCO/VICODIN) 5-325 MG per tablet 1 to 2 tabs every 4 to 6 hours as needed for pain. Patient not taking: Reported on 06/28/2015 06/24/13   Reuben Likes, MD  losartan (COZAAR) 50 MG tablet Take 1 tablet (50 mg total) by mouth daily. Patient not taking: Reported on 09/08/2017 07/21/15   Tysinger, Kermit Balo, PA-C  metoprolol succinate (TOPROL-XL) 25 MG 24 hr tablet Take 25 mg by mouth daily. 09/07/17   [provider]  Phentermine-Topiramate (QSYMIA) 3.75-23 MG CP24 Take 1 capsule by mouth every morning. Patient not taking: Reported on 09/08/2017 07/19/15   Tysinger, Kermit Balo, PA-C  Phentermine-Topiramate (QSYMIA) 7.5-46 MG CP24 Take 1 capsule by mouth every morning. Patient not taking: Reported on 09/08/2017 07/19/15   Tysinger, Kermit Balo, PA-C  traMADol (ULTRAM) 50 MG tablet Take 1 tablet (50 mg total) by mouth every 12 (twelve) hours as needed. Patient not taking: Reported on 09/08/2017 07/19/15   Tysinger, Kermit Balo, PA-C    Family History Family History  Problem Relation Age of Onset  . Arthritis Mother   . Hypertension Mother   . Stroke Mother   . Aneurysm Mother        died of brain aneurysm  . Asthma Father   . Cancer Father        ?  Marland Kitchen  Arthritis Father   . Heart disease Maternal Uncle   . Diabetes Maternal Uncle   . Alcohol abuse Neg Hx   . Hyperlipidemia Neg Hx   . Hearing loss Neg Hx   . Kidney disease Neg Hx   . Early death Neg Hx     Social History Social History   Tobacco Use  . Smoking status: Former Smoker    Packs/day: 0.25    Years: 4.00    Pack years: 1.00    Types: Cigarettes    Last attempt to quit: 12/29/2014    Years since quitting: 2.7  . Smokeless tobacco: Never Used  . Tobacco comment: once every 2 wk  Substance Use Topics  . Alcohol use: Yes    Alcohol/week: 0.0 oz  . Drug use: No       Allergies   Patient has no known allergies.   Review of Systems Review of Systems  Constitutional: Negative for chills and fever.  Respiratory: Negative.  Negative for shortness of breath.   Cardiovascular: Negative.  Negative for chest pain.  Gastrointestinal: Positive for abdominal pain, nausea and vomiting.  Genitourinary: Negative.   Musculoskeletal: Negative.   Skin: Negative.   Neurological: Negative.      Physical Exam Updated Vital Signs BP (!) 148/101 (BP Location: Right Arm)   Pulse (!) 102   Temp 98.4 F (36.9 C) (Oral)   Resp 18   Ht  (1.727 m)   Wt 132.9 kg (293 lb)   SpO2 97%   BMI 44.55 kg/m   Physical Exam  Constitutional: He appears well-developed and well-nourished.  HENT:  Head: Normocephalic.  Neck: Normal range of motion. Neck supple.  Cardiovascular: Normal rate and regular rhythm.  Pulmonary/Chest: Effort normal and breath sounds normal.  Abdominal: Soft. Bowel sounds are decreased. There is no tenderness. There is no rebound and no guarding.  Small soft umbilical hernia present without distention or mass.   Musculoskeletal: Normal range of motion.  Neurological: He is alert. No cranial nerve deficit.  Skin: Skin is warm and dry.  Psychiatric: He has a normal mood and affect.     ED Treatments / Results  Labs (all labs ordered are listed, but only abnormal results are displayed) Labs Reviewed  COMPREHENSIVE METABOLIC PANEL - Abnormal; Notable for the following components:      Result Value   Glucose, Bld 136 (*)    Creatinine, Ser 1.44 (*)    Total Protein 8.7 (*)    ALT 66 (*)    GFR calc non Af Amer 59 (*)    All other components within normal limits  CBC - Abnormal; Notable for the following components:   WBC 13.1 (*)    All other components within normal limits  LIPASE, BLOOD  URINALYSIS, ROUTINE W REFLEX MICROSCOPIC   Results for orders placed or performed during the hospital encounter of 09/12/17  Lipase, blood   Result Value Ref Range   Lipase 26 11 - 51 U/L  Comprehensive metabolic panel  Result Value Ref Range   Sodium 140 135 - 145 mmol/L   Potassium 3.6 3.5 - 5.1 mmol/L   Chloride 104 101 - 111 mmol/L   CO2 22 22 - 32 mmol/L   Glucose, Bld 136 (H) 65 - 99 mg/dL   BUN 15 6 - 20 mg/dL   Creatinine, Ser 0.98 (H) 0.61 - 1.24 mg/dL   Calcium 9.1 8.9 - 11.9 mg/dL   Total Protein 8.7 (H) 6.5 - 8.1 g/dL  Albumin 3.8 3.5 - 5.0 g/dL   AST 38 15 - 41 U/L   ALT 66 (H) 17 - 63 U/L   Alkaline Phosphatase 114 38 - 126 U/L   Total Bilirubin 0.6 0.3 - 1.2 mg/dL   GFR calc non Af Amer 59 (L) >60 mL/min   GFR calc Af Amer >60 >60 mL/min   Anion gap 14 5 - 15  CBC  Result Value Ref Range   WBC 13.1 (H) 4.0 - 10.5 K/uL   RBC 5.48 4.22 - 5.81 MIL/uL   Hemoglobin 15.2 13.0 - 17.0 g/dL   HCT 16.1 09.6 - 04.5 %   MCV 83.4 78.0 - 100.0 fL   MCH 27.7 26.0 - 34.0 pg   MCHC 33.3 30.0 - 36.0 g/dL   RDW 40.9 81.1 - 91.4 %   Platelets 331 150 - 400 K/uL     EKG None  Radiology No results found.  Procedures Procedures (including critical care time)  Medications Ordered in ED Medications - No data to display   Initial Impression / Assessment and Plan / ED Course  I have reviewed the triage vital signs and the nursing notes.  Pertinent labs & imaging results that were available during my care of the patient were reviewed by me and considered in my medical decision making (see chart for details).     Patient returns to the ED with abdominal pain and vomiting similar to previously diagnosed/resolved SBO.   His abdominal exam is benign. He has a mild leukocytosis of 13K and tachycardic on arrival. Plain film ordered to determine if repeat CT scan is warranted. Plan to discuss with surgery.  Abd series raises question for recurrent SBO. Discussed with Dr. Daphine Deutscher who advises no repeat CT is necessary. Ancef started. He has been NPO and will remain so. Dr. Daphine Deutscher will admit to his service.   Final  Clinical Impressions(s) / ED Diagnoses   Final diagnoses:  None   1. SBO  ED Discharge Orders    None       Elpidio Anis, PA-C 09/13/17 0347    Paula Libra, MD 09/13/17 [phone]

## 2017-09-13 NOTE — ED Notes (Signed)
Patient unable to urinate. Will try again soon.

## 2017-09-13 NOTE — ED Notes (Signed)
ED TO INPATIENT HANDOFF REPORT  Name/Age/Gender Frank Howe 42 y.o. male  Code Status    Code Status Orders  (From admission, onward)        Start     Ordered   09/13/17 1140  Full code  Continuous     09/13/17 1139    Code Status History    This patient has a current code status but no historical code status.      Home/SNF/Other Home  Chief Complaint Abdominal Pain  Level of Care/Admitting Diagnosis ED Disposition    ED Disposition Condition Comment   Admit  Hospital Area: [place] [[id number]]  Level of Care: Med-Surg [16]  Diagnosis: Partial small bowel obstruction [facility]) [[id number]]  Admitting Physician: CCS, Mathews  Attending Physician: CCS, MD [[id number]]  Estimated length of stay: past midnight tomorrow  Certification:: I certify this patient will need inpatient services for at least 2 midnights  Bed request comments: 5W  PT Class (Do Not Modify): Inpatient [101]  PT Acc Code (Do Not Modify): Private [1]       Medical History Past Medical History:  Diagnosis Date  . Elevated blood pressure reading without diagnosis of hypertension   . Gout   . Impaired fasting blood sugar   . Obesity   . OSA (obstructive sleep apnea)     Allergies No Known Allergies  IV Location/Drains/Wounds Patient Lines/Drains/Airways Status   Active Line/Drains/Airways    Name:   Placement date:   Placement time:   Site:   Days:   Peripheral IV 09/13/17 Left Forearm   09/13/17    0430    Forearm   less than 1   NG/OG Tube Nasogastric 16 Fr. Right nare Aucultation   09/13/17    1354    Right nare   less than 1          Labs/Imaging Results for orders placed or performed during the hospital encounter of 09/12/17 (from the past 48 hour(s))  Lipase, blood     Status: None   Collection Time: 09/12/17  9:29 PM  Result Value Ref Range   Lipase 26 11 - 51 U/L    Comment: Performed at [facility], [place] [address], [place], [place]  [address]  Comprehensive metabolic panel     Status: Abnormal   Collection Time: 09/12/17  9:29 PM  Result Value Ref Range   Sodium 140 135 - 145 mmol/L   Potassium 3.6 3.5 - 5.1 mmol/L   Chloride 104 101 - 111 mmol/L   CO2 22 22 - 32 mmol/L   Glucose, Bld 136 (H) 65 - 99 mg/dL   BUN 15 6 - 20 mg/dL   Creatinine, Ser 1.44 (H) 0.61 - 1.24 mg/dL   Calcium 9.1 8.9 - 10.3 mg/dL   Total Protein 8.7 (H) 6.5 - 8.1 g/dL   Albumin 3.8 3.5 - 5.0 g/dL   AST 38 15 - 41 U/L   ALT 66 (H) 17 - 63 U/L   Alkaline Phosphatase 114 38 - 126 U/L   Total Bilirubin 0.6 0.3 - 1.2 mg/dL   GFR calc non Af Amer 59 (L) >60 mL/min   GFR calc Af Amer >60 >60 mL/min    Comment: (NOTE) The eGFR has been calculated using the CKD EPI equation. This calculation has not been validated in all clinical situations. eGFR's persistently <60 mL/min signify possible Chronic Kidney Disease.    Anion gap 14 5 - 15    Comment:  Performed at [facility], [place] [address], [place], [place] [address]  CBC     Status: Abnormal   Collection Time: 09/12/17  9:29 PM  Result Value Ref Range   WBC 13.1 (H) 4.0 - 10.5 K/uL   RBC 5.48 4.22 - 5.81 MIL/uL   Hemoglobin 15.2 13.0 - 17.0 g/dL   HCT 45.7 39.0 - 52.0 %   MCV 83.4 78.0 - 100.0 fL   MCH 27.7 26.0 - 34.0 pg   MCHC 33.3 30.0 - 36.0 g/dL   RDW 13.9 11.5 - 15.5 %   Platelets 331 150 - 400 K/uL    Comment: Performed at [facility], [place] [address] [place], [place] [address]  Urinalysis, Routine w reflex microscopic     Status: Abnormal   Collection Time: 09/13/17  3:37 AM  Result Value Ref Range   Color, Urine YELLOW YELLOW   APPearance CLEAR CLEAR   Specific Gravity, Urine 1.030 1.005 - 1.030   pH 5.0 5.0 - 8.0   Glucose, UA NEGATIVE NEGATIVE mg/dL   Hgb urine dipstick NEGATIVE NEGATIVE   Bilirubin Urine NEGATIVE NEGATIVE   Ketones, ur NEGATIVE NEGATIVE mg/dL   Protein, ur 30 (A) NEGATIVE mg/dL   Nitrite NEGATIVE NEGATIVE    Leukocytes, UA NEGATIVE NEGATIVE   RBC / HPF 0-5 0 - 5 RBC/hpf   WBC, UA 6-10 0 - 5 WBC/hpf   Bacteria, UA RARE (A) NONE SEEN   Mucus PRESENT    Hyaline Casts, UA PRESENT     Comment: Performed at [facility], [place] [address], [place],  [address]   Dg Abdomen 1 View  Result Date: 09/13/2017 CLINICAL DATA:  Abnormal CT scan. Hernia containing small bowel loop. EXAM: ABDOMEN - 1 VIEW COMPARISON:  Abdominal series 09/13/2017.  CT 09/08/2017. FINDINGS: NG tube noted coiled stomach. Again noted are persistent distended loops of small bowel. Air is also noted in the colon. Again these changes could be related to partial small bowel obstruction and or adynamic ileus. Given the findings on recent CT of a umbilical hernia partial small-bowel obstruction should be considered. Small-bowel dilatation has progressed from prior exam with maximum diameter of small-bowel loops up to 6.0 cm. IMPRESSION: NG tube noted with tip in the colon. Progressive small-bowel distention as described above. Small bowel caliber up to 6.0 cm. Although adynamic ileus may be present as air is noted within the colon, the findings are very suspicious for partial small-bowel obstruction in light of previously identified umbilical hernia with herniation of small bowel. Electronically Signed   By: Marcello Moores  Register   On: 09/13/2017 14:33   Dg Abdomen Acute W/chest  Result Date: 09/13/2017 CLINICAL DATA:  Hernia EXAM: DG ABDOMEN ACUTE W/ 1V CHEST COMPARISON:  CT 09/08/2017 FINDINGS: Single-view chest demonstrates no acute consolidation or effusion. Normal heart size. No pneumothorax. Supine and upright views of the abdomen demonstrate multiple dilated loops of small bowel measuring up to 3.7 cm with fluid levels. Diffuse gas in the colon. IMPRESSION: 1. Single-view chest within normal limits 2. Multiple dilated loops of small bowel with multiple fluid levels but with gas present in the colon, findings could be  secondary to ileus but small bowel obstruction is also a concern given the history of umbilical hernia and recent CT findings. Electronically Signed   By: Donavan Foil M.D.   On: 09/13/2017 02:53    Pending Labs [employer] (From admission, onward)   Start     Ordered  09/20/17 0500  Creatinine, serum  (enoxaparin (LOVENOX)    CrCl >/= 30 ml/min)  Weekly,   R    Comments:  while on enoxaparin therapy    09/13/17 1139   09/14/17 2671  Basic metabolic panel  Tomorrow morning,   R     09/13/17 1139   09/14/17 0500  CBC  Tomorrow morning,   R     09/13/17 1139   09/13/17 1140  HIV antibody (Routine Testing)  Once,   R     09/13/17 1139      Vitals/Pain Today's Vitals   09/13/17 0800 09/13/17 1000 09/13/17 1105 09/13/17 1340  BP: (!) 152/85 (!) 175/87 (!) 167/98 (!) 148/74  Pulse: 90 82 88 (!) 101  Resp: _0 Temp:      TempSrc:      SpO2: 95% 100% 97% 97%  Weight:      Height:      PainSc:        Isolation Precautions No active isolations  Medications Medications  enoxaparin (LOVENOX) injection 40 mg (40 mg Subcutaneous Given 09/13/17 1435)  dextrose 5 % and 0.45 % NaCl with KCl 20 mEq/L infusion ( Intravenous New Bag/Given 09/13/17 1203)  morphine 2 MG/ML injection 1-4 mg (has no administration in time range)  diphenhydrAMINE (BENADRYL) capsule 25 mg (has no administration in time range)    Or  diphenhydrAMINE (BENADRYL) injection 25 mg (has no administration in time range)  ondansetron (ZOFRAN-ODT) disintegrating tablet 4 mg (has no administration in time range)    Or  ondansetron (ZOFRAN) injection 4 mg (has no administration in time range)  metoprolol tartrate (LOPRESSOR) injection 5 mg (has no administration in time range)  hydrALAZINE (APRESOLINE) injection 10 mg (has no administration in time range)  famotidine (PEPCID) IVPB 20 mg premix (0 mg Intravenous Stopped 09/13/17 1346)  menthol-cetylpyridinium (CEPACOL) lozenge 3 mg (3 mg Oral Given 09/13/17  1229)  phenol (CHLORASEPTIC) mouth spray 1 spray (1 spray Mouth/Throat Given 09/13/17 1330)  ceFAZolin (ANCEF) IVPB 1 g/50 mL premix (0 g Intravenous Stopped 09/13/17 0503)    Mobility walks

## 2017-09-13 NOTE — ED Notes (Signed)
Attempted to insert NG tube pt was not able to tolerate. Gave pt morphine throat lozenge and chloro septic sprayand will attempted when pt is more calm.

## 2017-09-13 NOTE — H&P (Signed)
Frank Howe 02-01-1976  [id number].    Chief Complaint/Reason for Consult: PSBO, umbilical hernia  HPI:  This is a pleasant 42 yo obese black male who has a history of HTN as well as OSA for which he does not use his CPAP.  He began having abdominal pain with nausea and vomiting last week around Wednesday or Thursday.  He came to the [facility] Friday night into Saturday morning.  Apparently he was found to have a PSBO secondary to an umbilical hernia.  He was stable to improved while in the ED and was felt safe for DC home.  However, earlier this week his symptoms returned.  He was unable to eat or drink much secondary to nausea and vomiting.  He was having some diarrhea.  He denies any fevers.  His abdomen was tender near his umbilicus.  He denies any other symptoms.  He tried to go to work yesterday, but felt so bad he had to return home.  He then returned to the [facility] where we have been called to see him for continued PSBO.  ROS: ROS : Please see HPI, otherwise negative.  Family History  Problem Relation Age of Onset  . Arthritis Mother   . Hypertension Mother   . Stroke Mother   . Aneurysm Mother        died of brain aneurysm  . Asthma Father   . Cancer Father        ?  Marland Kitchen Arthritis Father   . Heart disease Maternal Uncle   . Diabetes Maternal Uncle   . Alcohol abuse Neg Hx   . Hyperlipidemia Neg Hx   . Hearing loss Neg Hx   . Kidney disease Neg Hx   . Early death Neg Hx     Past Medical History:  Diagnosis Date  . Elevated blood pressure reading without diagnosis of hypertension   . Gout   . Impaired fasting blood sugar   . Obesity   . OSA (obstructive sleep apnea)     History reviewed. No pertinent surgical history.  Social History:  reports that he quit smoking about 2 years ago. His smoking use included cigarettes. He has a 1.00 pack-year smoking history. He has never used smokeless tobacco. He reports that he drinks alcohol. He reports that he does not use  drugs.  Allergies: No Known Allergies   (Not in a hospital admission)   Physical Exam: Blood pressure (!) 147/82, pulse 93, temperature 98.2 F (36.8 C), temperature source Oral, resp. rate 20, height _0  (1.727 m), weight 132.9 kg (293 lb), SpO2 99 %. General: pleasant, obese, black male who is laying in bed in NAD HEENT: head is normocephalic, atraumatic.  Sclera are noninjected.  PERRL.  Ears and nose without any masses or lesions.  Mouth is pink and dry Heart: regular, rate, and rhythm.  Normal s1,s2. No obvious murmurs, gallops, or rubs noted.  Palpable radial and pedal pulses bilaterally Lungs: CTAB, no wheezes, rhonchi, or rales noted.  Respiratory effort nonlabored Abd: soft, NT currently, obese, +BS, no masses or organomegaly.  Umbilical hernia noted with a defect approximately 2x2cm in size.  His hernia is currently reduced upon my exam. MS: all 4 extremities are symmetrical with no cyanosis, clubbing, or edema. Skin: warm and dry with no masses, lesions, or rashes Psych: A&Ox3 with an appropriate affect.   Results for orders placed or performed during the hospital encounter of 09/12/17 (from the past 48 hour(s))  Lipase,  blood     Status: None   Collection Time: 09/12/17  9:29 PM  Result Value Ref Range   Lipase 26 11 - 51 U/L    Comment: Performed at [facility], [place] [address], [place], [place] [address]  Comprehensive metabolic panel     Status: Abnormal   Collection Time: 09/12/17  9:29 PM  Result Value Ref Range   Sodium 140 135 - 145 mmol/L   Potassium 3.6 3.5 - 5.1 mmol/L   Chloride 104 101 - 111 mmol/L   CO2 22 22 - 32 mmol/L   Glucose, Bld 136 (H) 65 - 99 mg/dL   BUN 15 6 - 20 mg/dL   Creatinine, Ser 1.44 (H) 0.61 - 1.24 mg/dL   Calcium 9.1 8.9 - 10.3 mg/dL   Total Protein 8.7 (H) 6.5 - 8.1 g/dL   Albumin 3.8 3.5 - 5.0 g/dL   AST 38 15 - 41 U/L   ALT 66 (H) 17 - 63 U/L   Alkaline Phosphatase 114 38 - 126 U/L   Total Bilirubin 0.6  0.3 - 1.2 mg/dL   GFR calc non Af Amer 59 (L) >60 mL/min   GFR calc Af Amer >60 >60 mL/min    Comment: (NOTE) The eGFR has been calculated using the CKD EPI equation. This calculation has not been validated in all clinical situations. eGFR's persistently <60 mL/min signify possible Chronic Kidney Disease.    Anion gap 14 5 - 15    Comment: Performed at [facility], [place] [address], [place], [place] [address]  CBC     Status: Abnormal   Collection Time: 09/12/17  9:29 PM  Result Value Ref Range   WBC 13.1 (H) 4.0 - 10.5 K/uL   RBC 5.48 4.22 - 5.81 MIL/uL   Hemoglobin 15.2 13.0 - 17.0 g/dL   HCT 45.7 39.0 - 52.0 %   MCV 83.4 78.0 - 100.0 fL   MCH 27.7 26.0 - 34.0 pg   MCHC 33.3 30.0 - 36.0 g/dL   RDW 13.9 11.5 - 15.5 %   Platelets 331 150 - 400 K/uL    Comment: Performed at [facility], [place] [address], [place], [place] [address]  Urinalysis, Routine w reflex microscopic     Status: Abnormal   Collection Time: 09/13/17  3:37 AM  Result Value Ref Range   Color, Urine YELLOW YELLOW   APPearance CLEAR CLEAR   Specific Gravity, Urine 1.030 1.005 - 1.030   pH 5.0 5.0 - 8.0   Glucose, UA NEGATIVE NEGATIVE mg/dL   Hgb urine dipstick NEGATIVE NEGATIVE   Bilirubin Urine NEGATIVE NEGATIVE   Ketones, ur NEGATIVE NEGATIVE mg/dL   Protein, ur 30 (A) NEGATIVE mg/dL   Nitrite NEGATIVE NEGATIVE   Leukocytes, UA NEGATIVE NEGATIVE   RBC / HPF 0-5 0 - 5 RBC/hpf   WBC, UA 6-10 0 - 5 WBC/hpf   Bacteria, UA RARE (A) NONE SEEN   Mucus PRESENT    Hyaline Casts, UA PRESENT     Comment: Performed at [facility], [place] [address] [place], [place] [address]   Dg Abdomen Acute W/chest  Result Date: 09/13/2017 CLINICAL DATA:  Hernia EXAM: DG ABDOMEN ACUTE W/ 1V CHEST COMPARISON:  CT 09/08/2017 FINDINGS: Single-view chest demonstrates no acute consolidation or effusion. Normal heart size. No pneumothorax. Supine and upright views of the abdomen  demonstrate multiple dilated loops of small bowel measuring up to 3.7 cm with fluid levels. Diffuse gas in  the colon. IMPRESSION: 1. Single-view chest within normal limits 2. Multiple dilated loops of small bowel with multiple fluid levels but with gas present in the colon, findings could be secondary to ileus but small bowel obstruction is also a concern given the history of umbilical hernia and recent CT findings. Electronically Signed   By: Donavan Foil M.D.   On: 09/13/2017 02:53      Assessment/Plan  PSBO, ? Secondary to umbilical hernia The patient clearly has a PSBO seen on his plain films today as he has an air fluid level in his stomach as well as dilated small bowel, but with air throughout his colon.  He has never had abdominal surgery before and has an umbilical hernia.  This is felt the be the source of his PSBO, although he is currently reduced right now.   We will plan for now to admit him and decompress his stomach and small bowel prior to proceeding with an UHR.  He will have an NGT placed and IVFs started secondary to dehydration.  After he is decompressed today, we will plan for OR tomorrow.  The patient and wife understand this plan and are agreeable.  ARI Patient with a history of HTN, but also with dehydration secondary to N/V and decreased oral intake.  Will give fluids and follow his creatinine.  Baseline appears to be around 1.1.  HTN Will start hydralazine and metoprolol prn for now.  May have to schedule them if being given frequently.  BP isn't too bad currently.  Hold home meds while NGT in place  OSA Patient doesn't wear his CPAP at home.  Will follow while here.   FEN - NPO/NGT/IVFs VTE - Lovenox/SCDs ID - ancef given this am for pre-op dose by ED even though not going to OR today.  Henreitta Cea, [facility] 09/13/2017, 7:55 AM Pager: [phone]

## 2017-09-14 ENCOUNTER — Inpatient Hospital Stay (HOSPITAL_COMMUNITY): Payer: Commercial Managed Care - PPO | Admitting: Certified Registered"

## 2017-09-14 ENCOUNTER — Encounter (HOSPITAL_COMMUNITY): Admission: EM | Disposition: A | Discharge status: Home / Self Care | Payer: Self-pay | Source: Home / Self Care

## 2017-09-14 ENCOUNTER — Encounter (HOSPITAL_COMMUNITY): Payer: Self-pay | Admitting: Certified Registered"

## 2017-09-14 HISTORY — PX: UMBILICAL HERNIA REPAIR: SHX196

## 2017-09-14 LAB — HIV ANTIBODY (ROUTINE TESTING W REFLEX): HIV Screen 4th Generation wRfx: NONREACTIVE

## 2017-09-14 LAB — BASIC METABOLIC PANEL
Anion gap: 10 (ref 5–15)
BUN: 12 mg/dL (ref 6–20)
CO2: 24 mmol/L (ref 22–32)
Calcium: 8.7 mg/dL — ABNORMAL LOW (ref 8.9–10.3)
Chloride: 106 mmol/L (ref 101–111)
Creatinine, Ser: 1.12 mg/dL (ref 0.61–1.24)
GFR calc Af Amer: >60 mL/min (ref >60)
GFR calc non Af Amer: >60 mL/min (ref >60)
Glucose, Bld: 112 mg/dL — ABNORMAL HIGH (ref 65–99)
Potassium: 3.5 mmol/L (ref 3.5–5.1)
Sodium: 140 mmol/L (ref 135–145)

## 2017-09-14 LAB — CBC
HCT: 42 % (ref 39.0–52.0)
Hemoglobin: 13.4 g/dL (ref 13.0–17.0)
MCH: 26.8 pg (ref 26.0–34.0)
MCHC: 31.9 g/dL (ref 30.0–36.0)
MCV: 84 fL (ref 78.0–100.0)
Platelets: 300 10*3/uL (ref 150–400)
RBC: 5 MIL/uL (ref 4.22–5.81)
RDW: 14.3 % (ref 11.5–15.5)
WBC: 10.8 10*3/uL — ABNORMAL HIGH (ref 4.0–10.5)

## 2017-09-14 SURGERY — REPAIR, HERNIA, UMBILICAL, LAPAROSCOPIC
Anesthesia: General | Site: Abdomen

## 2017-09-14 MED ORDER — BUPIVACAINE LIPOSOME 1.3 % IJ SUSP
20.0000 mL | Freq: Once | INTRAMUSCULAR | Status: AC
  Administered 2017-09-14: 20 mL
  Filled 2017-09-14: qty 20

## 2017-09-14 MED ORDER — ROCURONIUM BROMIDE 10 MG/ML (PF) SYRINGE
PREFILLED_SYRINGE | INTRAVENOUS | Status: DC | PRN
  Administered 2017-09-14: 40 mg via INTRAVENOUS
  Administered 2017-09-14: 10 mg via INTRAVENOUS

## 2017-09-14 MED ORDER — BUPIVACAINE-EPINEPHRINE 0.5% -1:200000 IJ SOLN
INTRAMUSCULAR | Status: DC | PRN
  Administered 2017-09-14: 50 mL

## 2017-09-14 MED ORDER — SUGAMMADEX SODIUM 200 MG/2ML IV SOLN
INTRAVENOUS | Status: DC | PRN
  Administered 2017-09-14: 300 mg via INTRAVENOUS

## 2017-09-14 MED ORDER — HYDROMORPHONE HCL 1 MG/ML IJ SOLN
0.2500 mg | INTRAMUSCULAR | Status: DC | PRN
  Administered 2017-09-14: 0.5 mg via INTRAVENOUS

## 2017-09-14 MED ORDER — HYDROMORPHONE HCL 1 MG/ML IJ SOLN
INTRAMUSCULAR | Status: AC
  Filled 2017-09-14: qty 1

## 2017-09-14 MED ORDER — LACTATED RINGERS IV SOLN
INTRAVENOUS | Status: DC
  Administered 2017-09-14 (×3): via INTRAVENOUS

## 2017-09-14 MED ORDER — ONDANSETRON HCL 4 MG/2ML IJ SOLN
INTRAMUSCULAR | Status: DC | PRN
  Administered 2017-09-14: 4 mg via INTRAVENOUS

## 2017-09-14 MED ORDER — OXYCODONE HCL 5 MG/5ML PO SOLN
5.0000 mg | Freq: Once | ORAL | Status: DC | PRN
  Filled 2017-09-14: qty 5

## 2017-09-14 MED ORDER — FENTANYL CITRATE (PF) 250 MCG/5ML IJ SOLN
INTRAMUSCULAR | Status: DC | PRN
  Administered 2017-09-14 (×6): 50 ug via INTRAVENOUS

## 2017-09-14 MED ORDER — OXYCODONE HCL 5 MG PO TABS: 5.0000 mg | ORAL_TABLET | Freq: Once | ORAL | Status: DC | PRN

## 2017-09-14 MED ORDER — FENTANYL CITRATE (PF) 250 MCG/5ML IJ SOLN
INTRAMUSCULAR | Status: AC
  Filled 2017-09-14: qty 5

## 2017-09-14 MED ORDER — MEPERIDINE HCL 50 MG/ML IJ SOLN: 6.2500 mg | INTRAMUSCULAR | Status: DC | PRN

## 2017-09-14 MED ORDER — BUPIVACAINE-EPINEPHRINE 0.5% -1:200000 IJ SOLN
INTRAMUSCULAR | Status: AC
  Filled 2017-09-14: qty 1

## 2017-09-14 MED ORDER — 0.9 % SODIUM CHLORIDE (POUR BTL) OPTIME
TOPICAL | Status: DC | PRN
  Administered 2017-09-14: 1000 mL

## 2017-09-14 MED ORDER — KCL IN DEXTROSE-NACL 20-5-0.45 MEQ/L-%-% IV SOLN
INTRAVENOUS | Status: DC
  Administered 2017-09-14 – 2017-09-16 (×3): via INTRAVENOUS
  Filled 2017-09-14 (×4): qty 1000

## 2017-09-14 MED ORDER — PROPOFOL 10 MG/ML IV BOLUS
INTRAVENOUS | Status: DC | PRN
  Administered 2017-09-14: 200 mg via INTRAVENOUS

## 2017-09-14 MED ORDER — LIDOCAINE 2% (20 MG/ML) 5 ML SYRINGE
INTRAMUSCULAR | Status: DC | PRN
  Administered 2017-09-14: 100 mg via INTRAVENOUS

## 2017-09-14 MED ORDER — ACETAMINOPHEN 500 MG PO TABS
1000.0000 mg | ORAL_TABLET | Freq: Four times a day (QID) | ORAL | Status: DC
  Administered 2017-09-14 – 2017-09-16 (×8): 1000 mg via ORAL
  Filled 2017-09-14 (×7): qty 2

## 2017-09-14 MED ORDER — DEXTROSE 5 % IV SOLN
3.0000 g | Freq: Once | INTRAVENOUS | Status: AC
  Administered 2017-09-14: 3 g via INTRAVENOUS
  Filled 2017-09-14: qty 3

## 2017-09-14 MED ORDER — PROMETHAZINE HCL 25 MG/ML IJ SOLN: 6.2500 mg | INTRAMUSCULAR | Status: DC | PRN

## 2017-09-14 MED ORDER — PROPOFOL 10 MG/ML IV BOLUS
INTRAVENOUS | Status: AC
  Filled 2017-09-14: qty 20

## 2017-09-14 MED ORDER — MIDAZOLAM HCL 2 MG/2ML IJ SOLN
INTRAMUSCULAR | Status: DC | PRN
  Administered 2017-09-14 (×2): 1 mg via INTRAVENOUS

## 2017-09-14 MED ORDER — PANTOPRAZOLE SODIUM 20 MG PO TBEC
20.0000 mg | DELAYED_RELEASE_TABLET | Freq: Every day | ORAL | Status: DC
  Administered 2017-09-14 – 2017-09-16 (×3): 20 mg via ORAL
  Filled 2017-09-14 (×3): qty 1

## 2017-09-14 MED ORDER — SUCCINYLCHOLINE CHLORIDE 200 MG/10ML IV SOSY
PREFILLED_SYRINGE | INTRAVENOUS | Status: DC | PRN
  Administered 2017-09-14: 140 mg via INTRAVENOUS

## 2017-09-14 MED ORDER — ENOXAPARIN SODIUM 40 MG/0.4ML ~~LOC~~ SOLN
40.0000 mg | SUBCUTANEOUS | Status: DC
  Administered 2017-09-15: 40 mg via SUBCUTANEOUS
  Filled 2017-09-14: qty 0.4

## 2017-09-14 MED ORDER — MIDAZOLAM HCL 2 MG/2ML IJ SOLN
INTRAMUSCULAR | Status: AC
  Filled 2017-09-14: qty 2

## 2017-09-14 MED ORDER — OXYCODONE HCL 5 MG PO TABS
5.0000 mg | ORAL_TABLET | ORAL | Status: DC | PRN
  Administered 2017-09-15: 5 mg via ORAL
  Filled 2017-09-14: qty 1

## 2017-09-14 MED ORDER — ACETAMINOPHEN 500 MG PO TABS
ORAL_TABLET | ORAL | Status: AC
  Filled 2017-09-14: qty 2

## 2017-09-14 MED ORDER — METHOCARBAMOL 500 MG PO TABS
500.0000 mg | ORAL_TABLET | Freq: Three times a day (TID) | ORAL | Status: DC
  Administered 2017-09-14 – 2017-09-16 (×6): 500 mg via ORAL
  Filled 2017-09-14 (×7): qty 1

## 2017-09-14 SURGICAL SUPPLY — 35 items
BINDER ABDOMINAL 12 ML 46-62 (SOFTGOODS) IMPLANT
CHLORAPREP W/TINT 26ML (MISCELLANEOUS) ×2 IMPLANT
DECANTER SPIKE VIAL GLASS SM (MISCELLANEOUS) ×2 IMPLANT
DERMABOND ADVANCED (GAUZE/BANDAGES/DRESSINGS) ×1
DERMABOND ADVANCED .7 DNX12 (GAUZE/BANDAGES/DRESSINGS) ×1 IMPLANT
DEVICE SECURE STRAP 25 ABSORB (INSTRUMENTS) ×2 IMPLANT
DEVICE TROCAR PUNCTURE CLOSURE (ENDOMECHANICALS) ×2 IMPLANT
ELECT REM PT RETURN 15FT ADLT (MISCELLANEOUS) ×2 IMPLANT
GLOVE BIO SURGEON STRL SZ 6.5 (GLOVE) ×2 IMPLANT
GLOVE BIOGEL PI IND STRL 7.0 (GLOVE) ×1 IMPLANT
GLOVE BIOGEL PI INDICATOR 7.0 (GLOVE) ×1
GOWN STRL REUS W/TWL 2XL LVL3 (GOWN DISPOSABLE) ×2 IMPLANT
GOWN STRL REUS W/TWL XL LVL3 (GOWN DISPOSABLE) ×6 IMPLANT
IRRIG SUCT STRYKERFLOW 2 WTIP (MISCELLANEOUS)
IRRIGATION SUCT STRKRFLW 2 WTP (MISCELLANEOUS) IMPLANT
KIT BASIN OR (CUSTOM PROCEDURE TRAY) ×2 IMPLANT
MARKER SKIN DUAL TIP RULER LAB (MISCELLANEOUS) ×2 IMPLANT
MESH VENTRALIGHT ST 4.5IN (Mesh General) ×2 IMPLANT
NEEDLE INSUFFLATION 14GA 120MM (NEEDLE) ×2 IMPLANT
NEEDLE SPNL 22GX3.5 QUINCKE BK (NEEDLE) ×2 IMPLANT
PAD POSITIONING PINK XL (MISCELLANEOUS) ×2 IMPLANT
POSITIONER SURGICAL ARM (MISCELLANEOUS) ×2 IMPLANT
SCISSORS LAP 5X35 DISP (ENDOMECHANICALS) ×2 IMPLANT
SLEEVE XCEL OPT CAN 5 100 (ENDOMECHANICALS) ×2 IMPLANT
SUT NOVA NAB DX-16 0-1 5-0 T12 (SUTURE) ×4 IMPLANT
SUT VIC AB 4-0 PS2 18 (SUTURE) ×2 IMPLANT
TOWEL OR 17X26 10 PK STRL BLUE (TOWEL DISPOSABLE) ×2 IMPLANT
TOWEL OR NON WOVEN STRL DISP B (DISPOSABLE) ×2 IMPLANT
TRAY FOLEY MTR SLVR 16FR STAT (SET/KITS/TRAYS/PACK) IMPLANT
TRAY LAPAROSCOPIC (CUSTOM PROCEDURE TRAY) ×2 IMPLANT
TROCAR BLADELESS OPT 5 100 (ENDOMECHANICALS) ×2 IMPLANT
TROCAR XCEL 12X100 BLDLESS (ENDOMECHANICALS) ×2 IMPLANT
TROCAR XCEL BLUNT TIP 100MML (ENDOMECHANICALS) IMPLANT
TROCAR XCEL NON-BLD 11X100MML (ENDOMECHANICALS) IMPLANT
TUBING INSUF HEATED (TUBING) ×2 IMPLANT

## 2017-09-14 NOTE — Addendum Note (Signed)
Addendum  created 09/14/17 1431 by Minerva Ends, CRNA   Intraprocedure Event edited

## 2017-09-14 NOTE — Anesthesia Procedure Notes (Signed)
Date/Time: 09/14/2017 12:18 PM Performed by: Minerva Ends, CRNA Oxygen Delivery Method: Simple face mask Placement Confirmation: positive ETCO2 and breath sounds checked- equal and bilateral Dental Injury: Teeth and Oropharynx as per pre-operative assessment

## 2017-09-14 NOTE — Transfer of Care (Signed)
Immediate Anesthesia Transfer of Care Note  Patient: Frank Howe  Procedure(s) Performed: LAPAROSCOPIC UMBILICAL HERNIA (N/A Abdomen)  Patient Location: PACU  Anesthesia Type:General  Level of Consciousness: sedated  Airway & Oxygen Therapy: Patient Spontanous Breathing and Patient connected to face mask oxygen  Post-op Assessment: Report given to RN and Post -op Vital signs reviewed and stable  Post vital signs: Reviewed and stable  Last Vitals:  Vitals Value Taken Time  BP    Temp    Pulse 106 09/14/2017 12:29 PM  Resp    SpO2 99 % 09/14/2017 12:29 PM  Vitals shown include unvalidated device data.  Last Pain:  Vitals:   09/14/17 0955  TempSrc:   PainSc: 0-No pain         Complications: No apparent anesthesia complications

## 2017-09-14 NOTE — Op Note (Signed)
09/12/2017 - 09/14/2017  12:08 PM  PATIENT:  Frank Howe  42 y.o. male  Patient Care Team: Patient, No Pcp Per as PCP - General (General Practice)  PRE-OPERATIVE DIAGNOSIS:  umbilical hernia  POST-OPERATIVE DIAGNOSIS:  umbilical hernia  PROCEDURE:   LAPAROSCOPIC UMBILICAL HERNIA REPAIR WITH MESH   Surgeon(s): Romie Levee, MD  ASSISTANT: Barnetta Chapel, PA   ANESTHESIA:   local and general  EBL: 10ml Total I/O In: 1000 [I.V.:1000] Out: -   Delay start of Pharmacological VTE agent (>24hrs) due to surgical blood loss or risk of bleeding:  no  DRAINS: none   SPECIMEN:  No Specimen  DISPOSITION OF SPECIMEN:  N/A  COUNTS:  YES  PLAN OF CARE: Pt already admitted  PATIENT DISPOSITION:  PACU - hemodynamically stable.  INDICATION: Pleasant patient has developed a ventral wall abdominal hernia.   Recommendation was made for surgical repair:  The anatomy & physiology of the abdominal wall was discussed. The pathophysiology of hernias was discussed. Natural history risks without surgery including progeressive enlargement, pain, incarceration & strangulation was discussed. Contributors to complications such as smoking, obesity, diabetes, prior surgery, etc were discussed.  I feel the risks of no intervention will lead to serious problems that outweigh the operative risks; therefore, I recommended surgery to reduce and repair the hernia. I explained laparoscopic techniques with possible need for an open approach. I noted the probable use of mesh to patch and/or buttress the hernia repair  Risks such as bleeding, infection, abscess, need for further treatment, heart attack, death, and other risks were discussed. I noted a good likelihood this will help address the problem. Goals of post-operative recovery were discussed as well. Possibility that this will not correct all symptoms was explained. I stressed the importance of low-impact activity, aggressive pain control, avoiding  constipation, & not pushing through pain to minimize risk of post-operative chronic pain or injury. Possibility of reherniation especially with smoking, obesity, diabetes, immunosuppression, and other health conditions was discussed. We will work to minimize complications.  An educational handout further explaining the pathology & treatment options was given as well. Questions were answered. The patient expresses understanding & wishes to proceed with surgery.   OR FINDINGS:    Type of repair - Laparoscopic underlay repair   Name of mesh - Bard Ventralight dual sided (polypropylene / Seprafilm)  Size of mesh - Length 11.5 cm, Width 11.5 cm  Mesh overlap - 3 cm  Placement of mesh - Intraperitoneal underlay repair   DESCRIPTION:   Informed consent was confirmed. The patient underwent general anaesthesia without difficulty. The patient was positioned appropriately. VTE prevention in place. The patient's abdomen was clipped, prepped, & draped in a sterile fashion. Surgical timeout confirmed our plan.  The patient was positioned in reverse Trendelenburg. Abdominal entry was gained using optical entry technique in the left upper abdomen. Entry was clean. I induced carbon dioxide insufflation. Camera inspection revealed no injury. Extra ports were carefully placed under direct laparoscopic visualization.   I could see the hernia in the mid abdomen.    I did laparoscopic lysis of adhesions to expose the entire anterior abdominal wall.  I primarily used and focused cold scissors.    I made sure hemostasis was good.  I mapped out the region using a needle passer.   To ensure that I would have at least 3 cm radial coverage outside of the hernia defect, I chose a 11 cm dual sided mesh.  I placed #1 Novofil stitches around its  edge about every 5 cm = 4 total.  I rolled the mesh & placed into the peritoneal cavity through the 10 cm fascial defect.  I unrolled  the mesh and positioned it appropriately.  I  secured the mesh to cover up the hernia defect using a laparoscopic suture passer to pass the tails of the Prolene through the abdominal wall & tagged them with clamps.  I started out in four corners to make sure I had the mesh centered over the hernia defect appropriately, and then proceeded to work in quadrants.  We evacuated CO2 & desufflated the abdomen.  I tied the fascial stitches down.  I reinsufflated the abdomen.  The mesh provided at least 3 cm circumferential coverage around the entire region of hernia defects.   I tacked the edges & central part of the mesh to the peritoneum/posterior rectus fascia with  SecureStrap absorbable tacks.   Hemostasis was excellent.  I closed the fascia port site on the 10 mm port using a 0 Vicryl stitch using laparoscopic intracorporeal suture passer.  I then placed Experel in the preperitoneal plane. I did reinspection. Hemostasis was good. Mesh laid well. Capnoperitoneum was evacuated. Ports were removed. The skin was closed with Monocryl at the port sites and Steri-Strips on the fascial stitch puncture sites.  Patient is being extubated to go to the recovery room. I'm about discussed operative findings with the patient's family.

## 2017-09-14 NOTE — Progress Notes (Signed)
Recurrent SBO  Subjective: Pt with NG decompression overnight  Objective: Vital signs in last 24 hours: Temp:  [97.5 F (36.4 C)-98.4 F (36.9 C)] 98.4 F (36.9 C) (05/24 0900) Pulse Rate:  [71-101] 74 (05/24 0955) Resp:  [17-20] 20 (05/24 0955) BP: (136-170)/(74-102) 142/92 (05/24 0955) SpO2:  [95 %-98 %] 96 % (05/24 0955) Last BM Date: 09/13/17  Intake/Output from previous day: 05/23 0701 - 05/24 0700 In: 2503.8 [P.O.:210; I.V.:2243.8; IV Piggyback:50] Out: 1720 [Urine:400; Emesis/NG output:1320] Intake/Output this shift: No intake/output data recorded.  General appearance: alert and cooperative GI: normal findings: umbilical hernia  Lab Results:  Results for orders placed or performed during the hospital encounter of 09/12/17 (from the past 24 hour(s))  HIV antibody (Routine Testing)     Status: None   Collection Time: 09/13/17  4:07 PM  Result Value Ref Range   HIV Screen 4th Generation wRfx Non Reactive Non Reactive  Surgical pcr screen     Status: None   Collection Time: 09/13/17  7:21 PM  Result Value Ref Range   MRSA, PCR NEGATIVE NEGATIVE   Staphylococcus aureus NEGATIVE NEGATIVE  Basic metabolic panel     Status: Abnormal   Collection Time: 09/14/17  5:22 AM  Result Value Ref Range   Sodium 140 135 - 145 mmol/L   Potassium 3.5 3.5 - 5.1 mmol/L   Chloride 106 101 - 111 mmol/L   CO2 24 22 - 32 mmol/L   Glucose, Bld 112 (H) 65 - 99 mg/dL   BUN 12 6 - 20 mg/dL   Creatinine, Ser 1.61 0.61 - 1.24 mg/dL   Calcium 8.7 (L) 8.9 - 10.3 mg/dL   GFR calc non Af Amer >60 >60 mL/min   GFR calc Af Amer >60 >60 mL/min   Anion gap 10 5 - 15  CBC     Status: Abnormal   Collection Time: 09/14/17  5:22 AM  Result Value Ref Range   WBC 10.8 (H) 4.0 - 10.5 K/uL   RBC 5.00 4.22 - 5.81 MIL/uL   Hemoglobin 13.4 13.0 - 17.0 g/dL   HCT 09.6 04.5 - 40.9 %   MCV 84.0 78.0 - 100.0 fL   MCH 26.8 26.0 - 34.0 pg   MCHC 31.9 30.0 - 36.0 g/dL   RDW 81.1 91.4 - 78.2 %   Platelets  300 150 - 400 K/uL     Studies/Results Radiology     MEDS, Scheduled . [MAR Hold] enoxaparin (LOVENOX) injection  40 mg Subcutaneous Q24H     Assessment: Umb hernia causing recurrent bowel obstruction  Plan: The anatomy & physiology of hernias was discussed.  Natural history risks without surgery was discussed.   I feel the risks of no intervention will lead to serious problems that outweigh the operative risks; therefore, I recommended lap hernia repair with mesh.  I explained laparoscopic techniques with possible need for an open approach.  Risks such as bleeding, infection, abscess, leak, injury to other organs, need for further treatment, heart attack, death, and other risks were discussed.  I noted a good likelihood this will help address the problem.  Possibility that this will not correct all abdominal symptoms was explained.  We discussed the small chance of mesh infection and recurrent hernias.  Goals of post-operative recovery were discussed as well.  We will work to minimize complications.  An educational handout further explaining the pathology and treatment options was given as well.  Questions were answered.  The patient expresses understanding & wishes to  proceed with surgery.  LOS: 1 day    Vanita Panda, MD [facility], [place] [phone]   09/14/2017 10:12 AM

## 2017-09-14 NOTE — Anesthesia Postprocedure Evaluation (Signed)
Anesthesia Post Note  Patient: Frank Howe  Procedure(s) Performed: LAPAROSCOPIC UMBILICAL HERNIA (N/A Abdomen)     Patient location during evaluation: PACU Anesthesia Type: General Level of consciousness: awake and alert Pain management: pain level controlled Vital Signs Assessment: post-procedure vital signs reviewed and stable Respiratory status: spontaneous breathing, nonlabored ventilation and respiratory function stable Cardiovascular status: blood pressure returned to baseline and stable Postop Assessment: no apparent nausea or vomiting Anesthetic complications: no    Last Vitals:  Vitals:   09/14/17 1300 09/14/17 1315  BP: (!) 166/92 (!) 164/94  Pulse: 96 98  Resp: (!) 25 (!) 24  Temp:    SpO2: 100% 95%    Last Pain:  Vitals:   09/14/17 1315  TempSrc:   PainSc: 4                  Lowella Curb

## 2017-09-14 NOTE — Anesthesia Procedure Notes (Signed)
Procedure Name: Intubation Date/Time: 09/14/2017 10:54 AM Performed by: Minerva Ends, CRNA Pre-anesthesia Checklist: Patient identified, Emergency Drugs available, Suction available and Patient being monitored Patient Re-evaluated:Patient Re-evaluated prior to induction Oxygen Delivery Method: Circle System Utilized Preoxygenation: Pre-oxygenation with 100% oxygen Induction Type: IV induction Ventilation: Mask ventilation without difficulty Laryngoscope Size: Miller and 2 Grade View: Grade I Tube type: Oral Number of attempts: 1 Airway Equipment and Method: Stylet Placement Confirmation: ETT inserted through vocal cords under direct vision,  positive ETCO2 and breath sounds checked- equal and bilateral Secured at: 22 cm Tube secured with: Tape Dental Injury: Teeth and Oropharynx as per pre-operative assessment  Comments: Smooth IV induction-- intubation AM CRNA atraumatic-- teeth and mouth as preop-- bilat BS BS Miller--- teeth front teeth chipped-- unchanged after laryngoscopy

## 2017-09-14 NOTE — Anesthesia Preprocedure Evaluation (Addendum)
Anesthesia Evaluation  Patient identified by MRN, date of birth, ID band Patient awake    Reviewed: Allergy & Precautions, NPO status , Patient's Chart, lab work & pertinent test results  Airway Mallampati: II  TM Distance: >3 FB Neck ROM: Full    Dental no notable dental hx. (+) Dental Advisory Given, Chipped, Poor Dentition   Pulmonary neg pulmonary ROS, sleep apnea , former smoker,    Pulmonary exam normal breath sounds clear to auscultation       Cardiovascular negative cardio ROS Normal cardiovascular exam Rhythm:Regular Rate:Normal     Neuro/Psych negative neurological ROS  negative psych ROS   GI/Hepatic negative GI ROS, Neg liver ROS,   Endo/Other  negative endocrine ROSMorbid obesity  Renal/GU negative Renal ROS  negative genitourinary   Musculoskeletal negative musculoskeletal ROS (+)   Abdominal   Peds negative pediatric ROS (+)  Hematology negative hematology ROS (+)   Anesthesia Other Findings Umbilical hernia/bowel obstruction  Reproductive/Obstetrics negative OB ROS                           Anesthesia Physical Anesthesia Plan  ASA: III  Anesthesia Plan: General   Post-op Pain Management:    Induction: Intravenous, Rapid sequence and Cricoid pressure planned  PONV Risk Score and Plan: 2 and Ondansetron and Midazolam  Airway Management Planned: Oral ETT  Additional Equipment:   Intra-op Plan:   Post-operative Plan: Extubation in OR  Informed Consent: I have reviewed the patients History and Physical, chart, labs and discussed the procedure including the risks, benefits and alternatives for the proposed anesthesia with the patient or authorized representative who has indicated his/her understanding and acceptance.   Dental advisory given  Plan Discussed with: CRNA  Anesthesia Plan Comments:         Anesthesia Quick Evaluation

## 2017-09-15 MED ORDER — METOPROLOL SUCCINATE ER 25 MG PO TB24
25.0000 mg | ORAL_TABLET | Freq: Every day | ORAL | Status: DC
  Administered 2017-09-15 – 2017-09-16 (×2): 25 mg via ORAL
  Filled 2017-09-15 (×3): qty 1

## 2017-09-15 MED ORDER — ALUM & MAG HYDROXIDE-SIMETH 200-200-20 MG/5ML PO SUSP
30.0000 mL | Freq: Four times a day (QID) | ORAL | Status: DC | PRN
  Administered 2017-09-15: 30 mL via ORAL
  Filled 2017-09-15: qty 30

## 2017-09-15 MED ORDER — AMLODIPINE BESYLATE 10 MG PO TABS
10.0000 mg | ORAL_TABLET | Freq: Every day | ORAL | Status: DC
  Administered 2017-09-15 – 2017-09-16 (×2): 10 mg via ORAL
  Filled 2017-09-15 (×2): qty 1

## 2017-09-15 MED ORDER — COLCHICINE 0.6 MG PO TABS
0.6000 mg | ORAL_TABLET | Freq: Two times a day (BID) | ORAL | Status: DC
  Administered 2017-09-15: 0.6 mg via ORAL
  Filled 2017-09-15 (×3): qty 1

## 2017-09-15 NOTE — Progress Notes (Signed)
Patient ID: Frank Howe, male   DOB: 12/20/1975, 42 y.o.   MRN: [id number] 1 Day Post-Op   Subjective: No complaints this morning.  Good pain control, just sore.  Has been tolerating clear liquids without nausea.  No flatus or bowel movement yet.  Ambulatory.  Objective: Vital signs in last 24 hours: Temp:  [97.7 F (36.5 C)-99.3 F (37.4 C)] 98.8 F (37.1 C) (05/25 0558) Pulse Rate:  [71-108] 72 (05/25 0558) Resp:  [16-72] 72 (05/25 0558) BP: (143-169)/(89-101) 155/101 (05/25 0558) SpO2:  [93 %-100 %] 94 % (05/25 0558) Last BM Date: 09/13/17  Intake/Output from previous day: 05/24 0701 - 05/25 0700 In: 2858.8 [P.O.:340; I.V.:2518.8] Out: 1400 [Urine:1300; Emesis/NG output:100] Intake/Output this shift: No intake/output data recorded.  General appearance: alert, cooperative, no distress and morbidly obese GI: Mild distention.  Nontender. Incision/Wound: Mild swelling around the umbilicus.  Incisions without erythema or drainage.  Lab Results:  Recent Labs    09/12/17 2129 09/14/17 0522  WBC 13.1* 10.8*  HGB 15.2 13.4  HCT 45.7 42.0  PLT 331 300   BMET Recent Labs    09/12/17 2129 09/14/17 0522  NA 140 140  K 3.6 3.5  CL 104 106  CO2 22 24  GLUCOSE 136* 112*  BUN 15 12  CREATININE 1.44* 1.12  CALCIUM 9.1 8.7*     Studies/Results: Dg Abdomen 1 View  Addendum Date: 09/13/2017   ADDENDUM REPORT: 09/13/2017 15:43 ADDENDUM: The impression should read : NG tube noted with tip in the stomach, not colon. Electronically Signed   By: Maisie Fus  Register   On: 09/13/2017 15:43   Result Date: 09/13/2017 CLINICAL DATA:  Abnormal CT scan. Hernia containing small bowel loop. EXAM: ABDOMEN - 1 VIEW COMPARISON:  Abdominal series 09/13/2017.  CT 09/08/2017. FINDINGS: NG tube noted coiled stomach. Again noted are persistent distended loops of small bowel. Air is also noted in the colon. Again these changes could be related to partial small bowel obstruction and or adynamic  ileus. Given the findings on recent CT of a umbilical hernia partial small-bowel obstruction should be considered. Small-bowel dilatation has progressed from prior exam with maximum diameter of small-bowel loops up to 6.0 cm. IMPRESSION: NG tube noted with tip in the colon. Progressive small-bowel distention as described above. Small bowel caliber up to 6.0 cm. Although adynamic ileus may be present as air is noted within the colon, the findings are very suspicious for partial small-bowel obstruction in light of previously identified umbilical hernia with herniation of small bowel. Electronically Signed: ByMaisie Fus  Register On: 09/13/2017 14:33    Anti-infectives: Anti-infectives (From admission, onward)   Start     Dose/Rate Route Frequency Ordered Stop   09/14/17 1045  ceFAZolin (ANCEF) 3 g in dextrose 5 % 50 mL IVPB     3 g 100 mL/hr over 30 Minutes Intravenous  Once 09/14/17 1019 09/14/17 1055   09/13/17 0400  ceFAZolin (ANCEF) IVPB 1 g/50 mL premix     1 g 100 mL/hr over 30 Minutes Intravenous  Once 09/13/17 0345 09/13/17 0503      Assessment/Plan: s/p Procedure(s): LAPAROSCOPIC UMBILICAL HERNIA Stable postoperatively.  Mild ileus seems improved.  Advance to full liquid diet. Hypertensive- Home medications reordered.   LOS: 2 days    Mariella Saa 09/15/2017

## 2017-09-16 ENCOUNTER — Inpatient Hospital Stay: Payer: Self-pay

## 2017-09-16 MED ORDER — OXYCODONE HCL 5 MG PO TABS: 5.0000 mg | ORAL_TABLET | Freq: Four times a day (QID) | ORAL | 0 refills | Status: DC | PRN

## 2017-09-16 NOTE — Progress Notes (Signed)
Pt alert, oriented, tolerating diet. D/C instructions and prescription were given. All questions answered. Pt was d/cd home with spouse.

## 2017-09-16 NOTE — Discharge Summary (Signed)
  Patient ID: Frank Howe [id number] 41 y.o. 1976/02/27  09/12/2017  Discharge date and time: 09/16/2017   Admitting Physician: Harriette Bouillon  Discharge Physician: Mariella Saa  Admission Diagnoses: SBO (small bowel obstruction) (HCC) [K56.609] Umbilical hernia  Discharge Diagnoses: Same  Operations: Procedure(s): LAPAROSCOPIC UMBILICAL HERNIA repair with lysis of adhesions  Admission Condition: fair  Discharged Condition: good  Indication for Admission: Patient presented with several days of nausea vomiting and pain in his periumbilical area.  Imaging showed evidence of small bowel obstruction with a loop of intestine in his umbilical hernia.  He was admitted for emergency repair.  Hospital Course: Patient underwent laparoscopic repair of his incarcerated umbilical hernia and lysis of adhesions by Dr. Romie Levee.  His postoperative course was unremarkable.  He was started a clear liquid diet on the second postoperative day and advanced to full liquids by the third postoperative day.  At this point he was having no significant pain, just soreness.  Passing gas.  Abdomen benign.  Tolerating full liquid diet.  He was felt ready for discharge.   Disposition: Home  Patient Instructions:  Allergies as of 09/16/2017   No Known Allergies     Medication List    TAKE these medications   amLODipine 10 MG tablet Commonly known as:  NORVASC Take 10 mg by mouth daily.   colchicine 0.6 MG tablet Take 0.6 mg by mouth 2 (two) times daily.   metoprolol succinate 25 MG 24 hr tablet Commonly known as:  TOPROL-XL Take 25 mg by mouth daily.   oxyCODONE 5 MG immediate release tablet Commonly known as:  Oxy IR/ROXICODONE Take 1 tablet (5 mg total) by mouth every 6 (six) hours as needed for moderate pain.       Activity: no heavy lifting for 3 weeks Diet: regular diet Wound Care: none needed  Follow-up:  With Dr. Maisie Fus in 3 weeks.  Signed: Mariella Saa MD,  FACS  09/16/2017, 8:34 AM

## 2017-09-16 NOTE — Progress Notes (Signed)
Patient ID: Frank Howe, male   DOB: 1975/09/14, 42 y.o.   MRN: [id number] 2 Days Post-Op   Subjective: No complaints today.  Just sore.  Tolerating full liquids without nausea.  Passing gas.  Objective: Vital signs in last 24 hours: Temp:  [98.2 F (36.8 C)-98.9 F (37.2 C)] 98.2 F (36.8 C) (05/26 0609) Pulse Rate:  [73-92] 73 (05/26 0609) Resp:  [16-20] 19 (05/25 2147) BP: (148-161)/(86-94) 148/87 (05/26 0609) SpO2:  [93 %-96 %] 93 % (05/26 0609) Last BM Date: 09/13/17  Intake/Output from previous day: 05/25 0701 - 05/26 0700 In: 1230 [P.O.:480; I.V.:750] Out: 675 [Urine:675] Intake/Output this shift: No intake/output data recorded.  General appearance: alert, cooperative, no distress and morbidly obese GI: normal findings: soft, non-tender Incision/Wound: No erythema or drainage  Lab Results:  Recent Labs    09/14/17 0522  WBC 10.8*  HGB 13.4  HCT 42.0  PLT 300   BMET Recent Labs    09/14/17 0522  NA 140  K 3.5  CL 106  CO2 24  GLUCOSE 112*  BUN 12  CREATININE 1.12  CALCIUM 8.7*     Studies/Results: No results found.  Anti-infectives: Anti-infectives (From admission, onward)   Start     Dose/Rate Route Frequency Ordered Stop   09/14/17 1045  ceFAZolin (ANCEF) 3 g in dextrose 5 % 50 mL IVPB     3 g 100 mL/hr over 30 Minutes Intravenous  Once 09/14/17 1019 09/14/17 1055   09/13/17 0400  ceFAZolin (ANCEF) IVPB 1 g/50 mL premix     1 g 100 mL/hr over 30 Minutes Intravenous  Once 09/13/17 0345 09/13/17 0503      Assessment/Plan: s/p Procedure(s): LAPAROSCOPIC UMBILICAL HERNIA Doing well postoperatively.  No apparent complication.  Tolerating full liquid diet well.  Okay for discharge today.   LOS: 3 days    Mariella Saa 09/16/2017

## 2017-09-16 NOTE — Discharge Instructions (Signed)
[facility] LAPAROSCOP[facility] SURGERY: POST OP INSTRUCTIONS Always review your discharge instruction sheet given to you by the facility where your surgery was performed. IF YOU HAVE DISABILITY OR FAMILY LEAVE FORMS, YOU MUST BRING THEM TO THE OFFICE FOR PROCESSING.   DO NOT GIVE THEM TO YOUR DOCTOR.  1. A prescription for pain medication may be given to you upon discharge.  Take your pain medication as prescribed, if needed.  If narcotic pain medicine is not needed, then you may take acetaminophen (Tylenol) or ibuprofen (Advil) as needed. 2. Take your usually prescribed medications unless otherwise directed. 3. If you need a refill on your pain medication, please contact your pharmacy.  They will contact our office to request authorization. Prescriptions will not be filled after 5pm or on week-ends. 4. You should follow a light diet the first few days after arrival home, such as soup and crackers, etc.  Be sure to include lots of fluids daily. 5. Most patients will experience some swelling and bruising in the area of the incisions.  Ice packs will help.  Swelling and bruising can take several days to resolve.  6. It is common to experience some constipation if taking pain medication after surgery.  Increasing fluid intake and taking a stool softener (such as Colace) will usually help or prevent this problem from occurring.  A mild laxative (Milk of Magnesia or Miralax) should be taken according to package instructions if there are no bowel movements after 48 hours. 7. Unless discharge instructions indicate otherwise, you may remove your bandages 24-48 hours after surgery, and you may shower at that time.  You may have steri-strips (small skin tapes) in place directly over the incision.  These strips should be left on the skin for 7-10 days.  If your surgeon used skin glue on the incision, you may shower in 24 hours.  The glue will flake off over the next 2-3 weeks.  Any sutures or  staples will be removed at the office during your follow-up visit. 8. ACTIVITIES:  You may resume regular (light) daily activities beginning the next day--such as daily self-care, walking, climbing stairs--gradually increasing activities as tolerated.  You may have sexual intercourse when it is comfortable.  Refrain from any heavy lifting or straining until approved by your doctor. a. You may drive when you are no longer taking prescription pain medication, you can comfortably wear a seatbelt, and you can safely maneuver your car and apply brakes. b. RETURN TO WORK:  ___In 3 weeks after follow-up visit with your surgeon _______________________________________________________ 9. You should see your doctor in the office for a follow-up appointment approximately 2-3 weeks after your surgery.  Make sure that you call for this appointment within a day or two after you arrive home to insure a convenient appointment time. 10. OTHER INSTRUCTIONS: __________________________________________________________________________________________________________________________ __________________________________________________________________________________________________________________________ WHEN TO CALL YOUR DOCTOR: 1. Fever over 101.0 2. Inability to urinate 3. Continued bleeding from incision. 4. Increased pain, redness, or drainage from the incision. 5. Increasing abdominal pain  The clinic staff is available to answer your questions during regular business hours.  Please dont hesitate to call and ask to speak to one of the nurses for clinical concerns.  If you have a medical emergency, go to the nearest emergency room or call 911.  A surgeon from [facility] is always on call at the hospital. [address], [place], [place]  [address] ? [address] [address], [place], [place]   [address] [phone])  [phone] ? [phone] ? FAX [phone] °Web site: [email or web address] °

## 2018-02-10 ENCOUNTER — Other Ambulatory Visit: Payer: Self-pay

## 2018-02-10 ENCOUNTER — Inpatient Hospital Stay (HOSPITAL_COMMUNITY)
Admission: EM | Admit: 2018-02-10 | Discharge: 2018-02-14 | DRG: 354 | Disposition: A | Discharge status: Home / Self Care | Payer: Commercial Managed Care - PPO | Attending: Surgery | Admitting: Surgery

## 2018-02-10 ENCOUNTER — Encounter (HOSPITAL_COMMUNITY): Payer: Self-pay

## 2018-02-10 DIAGNOSIS — Z6841 Body Mass Index (BMI) 40.0 and over, adult: Secondary | ICD-10-CM | POA: Diagnosis not present

## 2018-02-10 DIAGNOSIS — Z87891 Personal history of nicotine dependence: Secondary | ICD-10-CM | POA: Diagnosis not present

## 2018-02-10 DIAGNOSIS — Z8249 Family history of ischemic heart disease and other diseases of the circulatory system: Secondary | ICD-10-CM | POA: Diagnosis not present

## 2018-02-10 DIAGNOSIS — K565 Intestinal adhesions [bands], unspecified as to partial versus complete obstruction: Secondary | ICD-10-CM | POA: Diagnosis not present

## 2018-02-10 DIAGNOSIS — K420 Umbilical hernia with obstruction, without gangrene: Principal | ICD-10-CM | POA: Diagnosis present

## 2018-02-10 DIAGNOSIS — G4733 Obstructive sleep apnea (adult) (pediatric): Secondary | ICD-10-CM | POA: Diagnosis present

## 2018-02-10 DIAGNOSIS — K56609 Unspecified intestinal obstruction, unspecified as to partial versus complete obstruction: Secondary | ICD-10-CM | POA: Diagnosis not present

## 2018-02-10 DIAGNOSIS — J9811 Atelectasis: Secondary | ICD-10-CM | POA: Diagnosis not present

## 2018-02-10 DIAGNOSIS — K429 Umbilical hernia without obstruction or gangrene: Secondary | ICD-10-CM | POA: Diagnosis not present

## 2018-02-10 DIAGNOSIS — Z4682 Encounter for fitting and adjustment of non-vascular catheter: Secondary | ICD-10-CM | POA: Diagnosis not present

## 2018-02-10 DIAGNOSIS — M109 Gout, unspecified: Secondary | ICD-10-CM | POA: Diagnosis present

## 2018-02-10 DIAGNOSIS — R1110 Vomiting, unspecified: Secondary | ICD-10-CM | POA: Diagnosis not present

## 2018-02-10 DIAGNOSIS — Z01818 Encounter for other preprocedural examination: Secondary | ICD-10-CM

## 2018-02-10 DIAGNOSIS — K5651 Intestinal adhesions [bands], with partial obstruction: Secondary | ICD-10-CM | POA: Diagnosis not present

## 2018-02-10 DIAGNOSIS — R030 Elevated blood-pressure reading, without diagnosis of hypertension: Secondary | ICD-10-CM | POA: Diagnosis present

## 2018-02-10 DIAGNOSIS — R1084 Generalized abdominal pain: Secondary | ICD-10-CM | POA: Diagnosis present

## 2018-02-10 NOTE — ED Triage Notes (Addendum)
Pt reports umbilical pain and nausea x1 week, but denies current pain. Umbilical hernia noted. Endorses vomiting. A&Ox4.

## 2018-02-11 ENCOUNTER — Emergency Department (HOSPITAL_COMMUNITY): Payer: Commercial Managed Care - PPO

## 2018-02-11 ENCOUNTER — Inpatient Hospital Stay (HOSPITAL_COMMUNITY): Payer: Commercial Managed Care - PPO | Admitting: Certified Registered Nurse Anesthetist

## 2018-02-11 ENCOUNTER — Inpatient Hospital Stay (HOSPITAL_COMMUNITY): Payer: Commercial Managed Care - PPO

## 2018-02-11 ENCOUNTER — Encounter (HOSPITAL_COMMUNITY): Payer: Self-pay

## 2018-02-11 ENCOUNTER — Encounter (HOSPITAL_COMMUNITY): Admission: EM | Disposition: A | Discharge status: Home / Self Care | Payer: Self-pay | Source: Home / Self Care

## 2018-02-11 DIAGNOSIS — M109 Gout, unspecified: Secondary | ICD-10-CM | POA: Diagnosis present

## 2018-02-11 DIAGNOSIS — K565 Intestinal adhesions [bands], unspecified as to partial versus complete obstruction: Secondary | ICD-10-CM | POA: Diagnosis not present

## 2018-02-11 DIAGNOSIS — R1110 Vomiting, unspecified: Secondary | ICD-10-CM | POA: Diagnosis not present

## 2018-02-11 DIAGNOSIS — K5651 Intestinal adhesions [bands], with partial obstruction: Secondary | ICD-10-CM | POA: Diagnosis not present

## 2018-02-11 DIAGNOSIS — K420 Umbilical hernia with obstruction, without gangrene: Secondary | ICD-10-CM | POA: Diagnosis present

## 2018-02-11 DIAGNOSIS — J9811 Atelectasis: Secondary | ICD-10-CM | POA: Diagnosis not present

## 2018-02-11 DIAGNOSIS — Z8249 Family history of ischemic heart disease and other diseases of the circulatory system: Secondary | ICD-10-CM | POA: Diagnosis not present

## 2018-02-11 DIAGNOSIS — G4733 Obstructive sleep apnea (adult) (pediatric): Secondary | ICD-10-CM | POA: Diagnosis present

## 2018-02-11 DIAGNOSIS — Z6841 Body Mass Index (BMI) 40.0 and over, adult: Secondary | ICD-10-CM | POA: Diagnosis not present

## 2018-02-11 DIAGNOSIS — Z87891 Personal history of nicotine dependence: Secondary | ICD-10-CM | POA: Diagnosis not present

## 2018-02-11 DIAGNOSIS — R030 Elevated blood-pressure reading, without diagnosis of hypertension: Secondary | ICD-10-CM | POA: Diagnosis present

## 2018-02-11 DIAGNOSIS — Z4682 Encounter for fitting and adjustment of non-vascular catheter: Secondary | ICD-10-CM | POA: Diagnosis not present

## 2018-02-11 DIAGNOSIS — K429 Umbilical hernia without obstruction or gangrene: Secondary | ICD-10-CM | POA: Diagnosis not present

## 2018-02-11 DIAGNOSIS — R1084 Generalized abdominal pain: Secondary | ICD-10-CM | POA: Diagnosis present

## 2018-02-11 HISTORY — PX: UMBILICAL HERNIA REPAIR: SHX196

## 2018-02-11 LAB — URINALYSIS, ROUTINE W REFLEX MICROSCOPIC
Bilirubin Urine: NEGATIVE
Glucose, UA: NEGATIVE mg/dL
Hgb urine dipstick: NEGATIVE
Ketones, ur: 5 mg/dL — AB
Leukocytes, UA: NEGATIVE
Nitrite: NEGATIVE
Protein, ur: 100 mg/dL — AB
Specific Gravity, Urine: 1.031 — ABNORMAL HIGH (ref 1.005–1.030)
pH: 5 (ref 5.0–8.0)

## 2018-02-11 LAB — COMPREHENSIVE METABOLIC PANEL
ALT: 29 U/L (ref 0–44)
AST: 27 U/L (ref 15–41)
Albumin: 4 g/dL (ref 3.5–5.0)
Alkaline Phosphatase: 96 U/L (ref 38–126)
Anion gap: 13 (ref 5–15)
BUN: 12 mg/dL (ref 6–20)
CO2: 27 mmol/L (ref 22–32)
Calcium: 9.1 mg/dL (ref 8.9–10.3)
Chloride: 100 mmol/L (ref 98–111)
Creatinine, Ser: 1.35 mg/dL — ABNORMAL HIGH (ref 0.61–1.24)
GFR calc Af Amer: >60 mL/min (ref >60)
GFR calc non Af Amer: >60 mL/min (ref >60)
Glucose, Bld: 161 mg/dL — ABNORMAL HIGH (ref 70–99)
Potassium: 3.5 mmol/L (ref 3.5–5.1)
Sodium: 140 mmol/L (ref 135–145)
Total Bilirubin: 1 mg/dL (ref 0.3–1.2)
Total Protein: 8.7 g/dL — ABNORMAL HIGH (ref 6.5–8.1)

## 2018-02-11 LAB — SURGICAL PCR SCREEN
MRSA, PCR: NEGATIVE
Staphylococcus aureus: NEGATIVE

## 2018-02-11 LAB — CBC
HCT: 47.9 % (ref 39.0–52.0)
Hemoglobin: 14.7 g/dL (ref 13.0–17.0)
MCH: 26.3 pg (ref 26.0–34.0)
MCHC: 30.7 g/dL (ref 30.0–36.0)
MCV: 85.7 fL (ref 80.0–100.0)
Platelets: 346 10*3/uL (ref 150–400)
RBC: 5.59 MIL/uL (ref 4.22–5.81)
RDW: 14.2 % (ref 11.5–15.5)
WBC: 14.7 10*3/uL — ABNORMAL HIGH (ref 4.0–10.5)
nRBC: 0 % (ref 0.0–0.2)

## 2018-02-11 LAB — LIPASE, BLOOD: Lipase: 23 U/L (ref 11–51)

## 2018-02-11 SURGERY — REPAIR, HERNIA, UMBILICAL, ADULT
Anesthesia: General | Site: Abdomen

## 2018-02-11 MED ORDER — HYDROMORPHONE HCL 1 MG/ML IJ SOLN: 0.2500 mg | INTRAMUSCULAR | Status: DC | PRN

## 2018-02-11 MED ORDER — SODIUM CHLORIDE 0.9 % IJ SOLN
INTRAMUSCULAR | Status: AC
  Filled 2018-02-11: qty 50

## 2018-02-11 MED ORDER — LABETALOL HCL 5 MG/ML IV SOLN
INTRAVENOUS | Status: DC | PRN
  Administered 2018-02-11: 5 mg via INTRAVENOUS
  Administered 2018-02-11 (×4): 2.5 mg via INTRAVENOUS

## 2018-02-11 MED ORDER — LIDOCAINE HCL URETHRAL/MUCOSAL 2 % EX GEL
1.0000 "application " | Freq: Once | CUTANEOUS | Status: AC
  Administered 2018-02-11: 1
  Filled 2018-02-11: qty 5

## 2018-02-11 MED ORDER — DIPHENHYDRAMINE HCL 12.5 MG/5ML PO ELIX: 12.5000 mg | ORAL_SOLUTION | Freq: Four times a day (QID) | ORAL | Status: DC | PRN

## 2018-02-11 MED ORDER — IOPAMIDOL (ISOVUE-300) INJECTION 61%
100.0000 mL | Freq: Once | INTRAVENOUS | Status: AC | PRN
  Administered 2018-02-11: 100 mL via INTRAVENOUS

## 2018-02-11 MED ORDER — SUCCINYLCHOLINE CHLORIDE 200 MG/10ML IV SOSY
PREFILLED_SYRINGE | INTRAVENOUS | Status: DC | PRN
  Administered 2018-02-11: 140 mg via INTRAVENOUS

## 2018-02-11 MED ORDER — SUCCINYLCHOLINE CHLORIDE 200 MG/10ML IV SOSY
PREFILLED_SYRINGE | INTRAVENOUS | Status: AC
  Filled 2018-02-11: qty 10

## 2018-02-11 MED ORDER — ONDANSETRON HCL 4 MG/2ML IJ SOLN
INTRAMUSCULAR | Status: DC | PRN
  Administered 2018-02-11: 4 mg via INTRAVENOUS

## 2018-02-11 MED ORDER — LACTATED RINGERS IV SOLN
INTRAVENOUS | Status: DC
  Administered 2018-02-12 – 2018-02-14 (×4): via INTRAVENOUS

## 2018-02-11 MED ORDER — LABETALOL HCL 5 MG/ML IV SOLN
INTRAVENOUS | Status: AC
  Filled 2018-02-11: qty 4

## 2018-02-11 MED ORDER — ROCURONIUM BROMIDE 50 MG/5ML IV SOSY
PREFILLED_SYRINGE | INTRAVENOUS | Status: DC | PRN
  Administered 2018-02-11 (×2): 10 mg via INTRAVENOUS
  Administered 2018-02-11: 50 mg via INTRAVENOUS
  Administered 2018-02-11: 10 mg via INTRAVENOUS
  Administered 2018-02-11: 20 mg via INTRAVENOUS

## 2018-02-11 MED ORDER — HEPARIN SODIUM (PORCINE) 5000 UNIT/ML IJ SOLN
5000.0000 [IU] | Freq: Once | INTRAMUSCULAR | Status: AC
  Administered 2018-02-11: 5000 [IU] via SUBCUTANEOUS
  Filled 2018-02-11: qty 1

## 2018-02-11 MED ORDER — DEXAMETHASONE SODIUM PHOSPHATE 10 MG/ML IJ SOLN
INTRAMUSCULAR | Status: AC
  Filled 2018-02-11: qty 1

## 2018-02-11 MED ORDER — CEFAZOLIN (ANCEF) 1 G IV SOLR: 3.0000 g | INTRAVENOUS | Status: DC

## 2018-02-11 MED ORDER — ROCURONIUM BROMIDE 10 MG/ML (PF) SYRINGE
PREFILLED_SYRINGE | INTRAVENOUS | Status: AC
  Filled 2018-02-11: qty 10

## 2018-02-11 MED ORDER — BUPIVACAINE HCL (PF) 0.25 % IJ SOLN
INTRAMUSCULAR | Status: AC
  Filled 2018-02-11: qty 30

## 2018-02-11 MED ORDER — SUGAMMADEX SODIUM 500 MG/5ML IV SOLN
INTRAVENOUS | Status: AC
  Filled 2018-02-11: qty 5

## 2018-02-11 MED ORDER — MEPERIDINE HCL 50 MG/ML IJ SOLN: 6.2500 mg | INTRAMUSCULAR | Status: DC | PRN

## 2018-02-11 MED ORDER — LIDOCAINE 2% (20 MG/ML) 5 ML SYRINGE
INTRAMUSCULAR | Status: DC | PRN
  Administered 2018-02-11: 100 mg via INTRAVENOUS

## 2018-02-11 MED ORDER — SIMETHICONE 80 MG PO CHEW: 40.0000 mg | CHEWABLE_TABLET | Freq: Four times a day (QID) | ORAL | Status: DC | PRN

## 2018-02-11 MED ORDER — ONDANSETRON HCL 4 MG/2ML IJ SOLN
4.0000 mg | Freq: Four times a day (QID) | INTRAMUSCULAR | Status: DC | PRN
  Administered 2018-02-11: 4 mg via INTRAVENOUS
  Filled 2018-02-11: qty 2

## 2018-02-11 MED ORDER — LABETALOL HCL 5 MG/ML IV SOLN: 10.0000 mg | Freq: Once | INTRAVENOUS | Status: DC

## 2018-02-11 MED ORDER — MIDAZOLAM HCL 2 MG/2ML IJ SOLN
INTRAMUSCULAR | Status: AC
  Filled 2018-02-11: qty 2

## 2018-02-11 MED ORDER — HYDRALAZINE HCL 20 MG/ML IJ SOLN: 10.0000 mg | INTRAMUSCULAR | Status: DC | PRN

## 2018-02-11 MED ORDER — FENTANYL CITRATE (PF) 100 MCG/2ML IJ SOLN
50.0000 ug | Freq: Once | INTRAMUSCULAR | Status: AC
  Administered 2018-02-11: 50 ug via INTRAVENOUS
  Filled 2018-02-11: qty 2

## 2018-02-11 MED ORDER — ONDANSETRON 4 MG PO TBDP: 4.0000 mg | ORAL_TABLET | Freq: Four times a day (QID) | ORAL | Status: DC | PRN

## 2018-02-11 MED ORDER — ACETAMINOPHEN 500 MG PO TABS: 1000.0000 mg | ORAL_TABLET | Freq: Four times a day (QID) | ORAL | Status: DC

## 2018-02-11 MED ORDER — PHENOL 1.4 % MT LIQD
1.0000 | OROMUCOSAL | Status: DC | PRN
  Filled 2018-02-11 (×2): qty 177

## 2018-02-11 MED ORDER — IOPAMIDOL (ISOVUE-300) INJECTION 61%
INTRAVENOUS | Status: AC
  Filled 2018-02-11: qty 100

## 2018-02-11 MED ORDER — LIDOCAINE 2% (20 MG/ML) 5 ML SYRINGE
INTRAMUSCULAR | Status: AC
  Filled 2018-02-11: qty 5

## 2018-02-11 MED ORDER — PROMETHAZINE HCL 25 MG/ML IJ SOLN: 6.2500 mg | INTRAMUSCULAR | Status: DC | PRN

## 2018-02-11 MED ORDER — ESMOLOL HCL 100 MG/10ML IV SOLN
INTRAVENOUS | Status: DC | PRN
  Administered 2018-02-11: 30 mg via INTRAVENOUS

## 2018-02-11 MED ORDER — DEXTROSE 5 % IV SOLN
3.0000 g | INTRAVENOUS | Status: AC
  Administered 2018-02-11: 3 g via INTRAVENOUS
  Filled 2018-02-11: qty 3
  Filled 2018-02-11: qty 3000

## 2018-02-11 MED ORDER — ONDANSETRON HCL 4 MG/2ML IJ SOLN
4.0000 mg | Freq: Once | INTRAMUSCULAR | Status: AC
  Administered 2018-02-11: 4 mg via INTRAVENOUS
  Filled 2018-02-11: qty 2

## 2018-02-11 MED ORDER — MIDAZOLAM HCL 5 MG/5ML IJ SOLN
INTRAMUSCULAR | Status: DC | PRN
  Administered 2018-02-11: 2 mg via INTRAVENOUS

## 2018-02-11 MED ORDER — DIPHENHYDRAMINE HCL 50 MG/ML IJ SOLN: 12.5000 mg | Freq: Four times a day (QID) | INTRAMUSCULAR | Status: DC | PRN

## 2018-02-11 MED ORDER — MORPHINE SULFATE (PF) 4 MG/ML IV SOLN
4.0000 mg | Freq: Once | INTRAVENOUS | Status: DC
  Filled 2018-02-11: qty 1

## 2018-02-11 MED ORDER — SODIUM CHLORIDE 0.9 % IR SOLN
Status: DC | PRN
  Administered 2018-02-11: 1000 mL

## 2018-02-11 MED ORDER — PROPOFOL 10 MG/ML IV BOLUS
INTRAVENOUS | Status: AC
  Filled 2018-02-11: qty 20

## 2018-02-11 MED ORDER — LACTATED RINGERS IV SOLN
INTRAVENOUS | Status: DC
  Administered 2018-02-11 (×2): via INTRAVENOUS

## 2018-02-11 MED ORDER — FENTANYL CITRATE (PF) 250 MCG/5ML IJ SOLN
INTRAMUSCULAR | Status: AC
  Filled 2018-02-11: qty 5

## 2018-02-11 MED ORDER — LIDOCAINE VISCOUS HCL 2 % MT SOLN
15.0000 mL | Freq: Once | OROMUCOSAL | Status: AC
  Administered 2018-02-11: 15 mL via OROMUCOSAL
  Filled 2018-02-11: qty 15

## 2018-02-11 MED ORDER — HEPARIN SODIUM (PORCINE) 5000 UNIT/ML IJ SOLN
5000.0000 [IU] | Freq: Three times a day (TID) | INTRAMUSCULAR | Status: DC
  Administered 2018-02-11 – 2018-02-14 (×8): 5000 [IU] via SUBCUTANEOUS
  Filled 2018-02-11 (×8): qty 1

## 2018-02-11 MED ORDER — TRAMADOL HCL 50 MG PO TABS: 50.0000 mg | ORAL_TABLET | Freq: Four times a day (QID) | ORAL | Status: DC | PRN

## 2018-02-11 MED ORDER — ONDANSETRON HCL 4 MG/2ML IJ SOLN
INTRAMUSCULAR | Status: AC
  Filled 2018-02-11: qty 2

## 2018-02-11 MED ORDER — EPINEPHRINE PF 1 MG/ML IJ SOLN
INTRAMUSCULAR | Status: AC
  Filled 2018-02-11: qty 1

## 2018-02-11 MED ORDER — ONDANSETRON HCL 4 MG/2ML IJ SOLN: 4.0000 mg | Freq: Four times a day (QID) | INTRAMUSCULAR | Status: DC | PRN

## 2018-02-11 MED ORDER — MORPHINE SULFATE (PF) 2 MG/ML IV SOLN: 2.0000 mg | INTRAVENOUS | Status: DC | PRN

## 2018-02-11 MED ORDER — HYDRALAZINE HCL 20 MG/ML IJ SOLN
20.0000 mg | INTRAMUSCULAR | Status: DC | PRN
  Administered 2018-02-11: 20 mg via INTRAVENOUS
  Filled 2018-02-11: qty 1

## 2018-02-11 MED ORDER — POTASSIUM CHLORIDE IN NACL 20-0.9 MEQ/L-% IV SOLN
INTRAVENOUS | Status: DC
  Administered 2018-02-11: 06:00:00 via INTRAVENOUS
  Filled 2018-02-11: qty 1000

## 2018-02-11 MED ORDER — PROPOFOL 10 MG/ML IV BOLUS
INTRAVENOUS | Status: DC | PRN
  Administered 2018-02-11: 200 mg via INTRAVENOUS

## 2018-02-11 MED ORDER — SUGAMMADEX SODIUM 200 MG/2ML IV SOLN
INTRAVENOUS | Status: DC | PRN
  Administered 2018-02-11: 300 mg via INTRAVENOUS

## 2018-02-11 MED ORDER — FENTANYL CITRATE (PF) 100 MCG/2ML IJ SOLN
INTRAMUSCULAR | Status: DC | PRN
  Administered 2018-02-11: 100 ug via INTRAVENOUS
  Administered 2018-02-11 (×2): 50 ug via INTRAVENOUS

## 2018-02-11 MED ORDER — BUPIVACAINE LIPOSOME 1.3 % IJ SUSP
20.0000 mL | Freq: Once | INTRAMUSCULAR | Status: DC
  Filled 2018-02-11: qty 20

## 2018-02-11 MED ORDER — KETOROLAC TROMETHAMINE 30 MG/ML IJ SOLN: 30.0000 mg | Freq: Once | INTRAMUSCULAR | Status: DC | PRN

## 2018-02-11 MED ORDER — LABETALOL HCL 5 MG/ML IV SOLN
INTRAVENOUS | Status: AC
  Administered 2018-02-11: 10 mg via INTRAVENOUS
  Filled 2018-02-11: qty 4

## 2018-02-11 MED ORDER — ESMOLOL HCL 100 MG/10ML IV SOLN
INTRAVENOUS | Status: AC
  Filled 2018-02-11: qty 10

## 2018-02-11 MED ORDER — BUPIVACAINE-EPINEPHRINE 0.25% -1:200000 IJ SOLN
INTRAMUSCULAR | Status: DC | PRN
  Administered 2018-02-11: 30 mL

## 2018-02-11 MED ORDER — HYDROMORPHONE HCL 1 MG/ML IJ SOLN
0.5000 mg | INTRAMUSCULAR | Status: DC | PRN
  Administered 2018-02-11 – 2018-02-12 (×3): 0.5 mg via INTRAVENOUS
  Filled 2018-02-11 (×3): qty 0.5

## 2018-02-11 MED ORDER — LABETALOL HCL 5 MG/ML IV SOLN
10.0000 mg | Freq: Once | INTRAVENOUS | Status: AC | PRN
  Administered 2018-02-11: 10 mg via INTRAVENOUS

## 2018-02-11 MED ORDER — DEXAMETHASONE SODIUM PHOSPHATE 10 MG/ML IJ SOLN
INTRAMUSCULAR | Status: DC | PRN
  Administered 2018-02-11: 10 mg via INTRAVENOUS

## 2018-02-11 SURGICAL SUPPLY — 53 items
BENZOIN TINCTURE PRP APPL 2/3 (GAUZE/BANDAGES/DRESSINGS) IMPLANT
BINDER ABDOMINAL 12 ML 46-62 (SOFTGOODS) ×2 IMPLANT
CABLE HIGH FREQUENCY MONO STRZ (ELECTRODE) ×2 IMPLANT
CHLORAPREP W/TINT 26ML (MISCELLANEOUS) ×2 IMPLANT
COVER SURGICAL LIGHT HANDLE (MISCELLANEOUS) ×2 IMPLANT
COVER WAND RF STERILE (DRAPES) ×2 IMPLANT
DECANTER SPIKE VIAL GLASS SM (MISCELLANEOUS) IMPLANT
DERMABOND ADVANCED (GAUZE/BANDAGES/DRESSINGS) ×1
DERMABOND ADVANCED .7 DNX12 (GAUZE/BANDAGES/DRESSINGS) ×1 IMPLANT
DEVICE SECURE STRAP 25 ABSORB (INSTRUMENTS) ×2 IMPLANT
DRAPE LAPAROSCOPIC ABDOMINAL (DRAPES) ×2 IMPLANT
DRSG TEGADERM 2-3/8X2-3/4 SM (GAUZE/BANDAGES/DRESSINGS) IMPLANT
DRSG TEGADERM 4X4.75 (GAUZE/BANDAGES/DRESSINGS) ×2 IMPLANT
ELECT REM PT RETURN 15FT ADLT (MISCELLANEOUS) ×2 IMPLANT
GAUZE SPONGE 2X2 8PLY STRL LF (GAUZE/BANDAGES/DRESSINGS) ×1 IMPLANT
GLOVE BIO SURGEON STRL SZ7.5 (GLOVE) ×2 IMPLANT
GLOVE INDICATOR 8.0 STRL GRN (GLOVE) ×4 IMPLANT
GOWN STRL REUS W/TWL XL LVL3 (GOWN DISPOSABLE) ×4 IMPLANT
GRASPER SUT TROCAR 14GX15 (MISCELLANEOUS) ×2 IMPLANT
HOVERMATT SINGLE USE (MISCELLANEOUS) ×2 IMPLANT
KIT BASIN OR (CUSTOM PROCEDURE TRAY) ×2 IMPLANT
MARKER SKIN DUAL TIP RULER LAB (MISCELLANEOUS) ×4 IMPLANT
MESH VENTRALIGHT ST 4.5IN (Mesh General) ×2 IMPLANT
NEEDLE INSUFFLATION 14GA 120MM (NEEDLE) ×2 IMPLANT
NEEDLE SPNL 22GX3.5 QUINCKE BK (NEEDLE) ×2 IMPLANT
PAD POSITIONING PINK XL (MISCELLANEOUS) IMPLANT
POSITIONER SURGICAL ARM (MISCELLANEOUS) IMPLANT
RTRCTR WOUND ALEXIS 18CM SML (INSTRUMENTS) ×2
SAVER CELL AAL HAEMONETICS (INSTRUMENTS) ×1 IMPLANT
SCISSORS LAP 5X35 DISP (ENDOMECHANICALS) ×2 IMPLANT
SET IRRIG TUBING LAPAROSCOPIC (IRRIGATION / IRRIGATOR) IMPLANT
SHEARS HARMONIC ACE PLUS 36CM (ENDOMECHANICALS) IMPLANT
SLEEVE ADV FIXATION 5X100MM (TROCAR) IMPLANT
SLEEVE XCEL OPT CAN 5 100 (ENDOMECHANICALS) ×4 IMPLANT
SPONGE GAUZE 2X2 STER 10/PKG (GAUZE/BANDAGES/DRESSINGS) ×1
STRIP CLOSURE SKIN 1/2X4 (GAUZE/BANDAGES/DRESSINGS) IMPLANT
SUT ETHIBOND 0 MO6 C/R (SUTURE) ×2 IMPLANT
SUT MNCRL AB 4-0 PS2 18 (SUTURE) ×2 IMPLANT
SUT NOVA NAB DX-16 0-1 5-0 T12 (SUTURE) IMPLANT
SUT PDS AB 1 CTX 36 (SUTURE) ×4 IMPLANT
SUT PROLENE 0 CT 1 CR/8 (SUTURE) ×2 IMPLANT
SUT VIC AB 3-0 SH 27 (SUTURE) ×1
SUT VIC AB 3-0 SH 27XBRD (SUTURE) ×1 IMPLANT
TACKER 5MM HERNIA 3.5CML NAB (ENDOMECHANICALS) IMPLANT
TAPE CLOTH 4X10 WHT NS (GAUZE/BANDAGES/DRESSINGS) IMPLANT
TOWEL OR 17X26 10 PK STRL BLUE (TOWEL DISPOSABLE) ×2 IMPLANT
TRAY FOLEY MTR SLVR 16FR STAT (SET/KITS/TRAYS/PACK) ×2 IMPLANT
TRAY LAPAROSCOPIC (CUSTOM PROCEDURE TRAY) ×2 IMPLANT
TROCAR ADV FIXATION 5X100MM (TROCAR) IMPLANT
TROCAR BLADELESS OPT 5 100 (ENDOMECHANICALS) ×2 IMPLANT
TROCAR XCEL BLUNT TIP 100MML (ENDOMECHANICALS) IMPLANT
TROCAR XCEL NON-BLD 11X100MML (ENDOMECHANICALS) IMPLANT
TUBING INSUF HEATED (TUBING) ×2 IMPLANT

## 2018-02-11 NOTE — ED Notes (Signed)
RN used size 16 Fr. NG tube. Patient stated they used 20 Fr NG tube on previous admission. Will endorse Floor RN.

## 2018-02-11 NOTE — Progress Notes (Addendum)
Subjective Still with nausea/vomiting. Denies significant abdominal pain. Ready for surgery.  Objective: Vital signs in last 24 hours: Temp:  [98.2 F (36.8 C)-98.8 F (37.1 C)] 98.8 F (37.1 C) (10/21 0504) Pulse Rate:  [83-116] 110 (10/21 0504) Resp:  [16-20] 19 (10/21 0504) BP: (167-194)/(78-119) 167/107 (10/21 0504) SpO2:  [86 %-96 %] 96 % (10/21 0504) Weight:  [132.9 kg-136.5 kg] 136.5 kg (10/21 0504) Last BM Date: 02/09/18  Intake/Output from previous day: 10/20 0701 - 10/21 0700 In: 43 [I.V.:43] Out: 100 [Urine:100] Intake/Output this shift: No intake/output data recorded.  Gen: NAD, comfortable CV: RRR Pulm: Normal work of breathing Abd: Obese soft, nontender; distention difficult to assess 2/2 habitus; partially reducible umbilical hernia - no overlying skin changes Ext: SCDs in place  Lab Results: CBC  Recent Labs    02/11/18 0014  WBC 14.7*  HGB 14.7  HCT 47.9  PLT 346   BMET Recent Labs    02/11/18 0014  NA 140  K 3.5  CL 100  CO2 27  GLUCOSE 161*  BUN 12  CREATININE 1.35*  CALCIUM 9.1   PT/INR No results for input(s): LABPROT, INR in the last 72 hours. ABG No results for input(s): PHART, HCO3 in the last 72 hours.  Invalid input(s): PCO2, PO2  Studies/Results:  Anti-infectives: Anti-infectives (From admission, onward)   Start     Dose/Rate Route Frequency Ordered Stop   02/11/18 0630  [MAR Hold]  ceFAZolin (ANCEF) 3 g in dextrose 5 % 50 mL IVPB     (MAR Hold since Mon 02/11/2018 at 0651. Reason: Transfer to a Procedural area.)   3 g 100 mL/hr over 30 Minutes Intravenous To Surgery 02/11/18 0518 02/12/18 0630   02/11/18 0515  ceFAZolin (ANCEF) powder 3 g  Status:  Discontinued     3 g Other To Surgery 02/11/18 0514 02/11/18 0517       Assessment/Plan: Patient Active Problem List   Diagnosis Date Noted  . Umbilical hernia with obstruction but no gangrene 02/11/2018  . Partial small bowel obstruction (HCC) 09/13/2017  . Gout,  chronic 07/19/2015  . OSA (obstructive sleep apnea) 07/19/2015  . Obesity 06/29/2015  . Elevated uric acid in blood 06/29/2015  . Impaired fasting blood sugar 06/28/2015  . Knee swelling 06/28/2015  . Right knee pain 06/28/2015  . Elevated blood pressure reading without diagnosis of hypertension 06/28/2015  . Routine general medical examination at a health care facility 08/21/2011  Mr. Closser is a pleasant [phone] with recurrent umbilical hernia and likely associated SBO - hx of mesh repair 08/2017  -Given SBO associated with this, will plan hernia repair today -The anatomy and physiology of the GI tract and abdominal wall was discussed at length with the patient with associated illustrations. The pathophysiology of hernias was discussed at length as well -The planned procedure including both laparoscopic and open techniques, material risks (including, but not limited to, pain, bleeding, infection, scarring, need for blood transfusion, damage to surrounding structures-blood vessels/nerves/viscus/organs, need for additional procedures, recurrence which we discussed was quite high given obesity and prior hernia repair, chronic pain, mesh complications including erosion into other structures/vessels/organs/viscus, worsening of pre-existing medical conditions, pneumonia, heart attack, stroke, death) benefits and alternatives to surgery were discussed at length. The patient's questions were answered to his satisfaction, he voiced understanding and they elected to proceed with surgery. Additionally, we discussed typical postoperative expectations and the recovery process.   LOS: 0 days   Stephanie Coup. Cliffton Asters, M.D. [facility]

## 2018-02-11 NOTE — Progress Notes (Signed)
Patient unable to wear CPAP due to having a NG tube.

## 2018-02-11 NOTE — ED Notes (Signed)
Patient unable to tolerate NG Tube. While in the process on putting NG Tube pt push nurse arm and stated " Stop I cant do it" . Gen Surgery at the bedside stated stop for now and restart if pt is ready.

## 2018-02-11 NOTE — Anesthesia Procedure Notes (Signed)
Procedure Name: Intubation Date/Time: 02/11/2018 7:42 AM Performed by: Maxwell Caul, CRNA Pre-anesthesia Checklist: Patient identified, Emergency Drugs available, Suction available and Patient being monitored Patient Re-evaluated:Patient Re-evaluated prior to induction Oxygen Delivery Method: Circle system utilized Preoxygenation: Pre-oxygenation with 100% oxygen Induction Type: IV induction Laryngoscope Size: Mac and 4 Grade View: Grade I Tube type: Oral Tube size: 7.5 mm Number of attempts: 1 Airway Equipment and Method: Stylet Placement Confirmation: ETT inserted through vocal cords under direct vision,  positive ETCO2 and breath sounds checked- equal and bilateral Secured at: 21 cm Tube secured with: Tape Dental Injury: Teeth and Oropharynx as per pre-operative assessment

## 2018-02-11 NOTE — Anesthesia Preprocedure Evaluation (Signed)
Anesthesia Evaluation  Patient identified by MRN, date of birth, ID band Patient awake    Reviewed: Allergy & Precautions, NPO status , Patient's Chart, lab work & pertinent test results  Airway Mallampati: II  TM Distance: >3 FB Neck ROM: Full    Dental no notable dental hx. (+) Dental Advisory Given, Chipped, Poor Dentition   Pulmonary sleep apnea , former smoker,    Pulmonary exam normal breath sounds clear to auscultation       Cardiovascular negative cardio ROS Normal cardiovascular exam Rhythm:Regular Rate:Normal     Neuro/Psych negative neurological ROS  negative psych ROS   GI/Hepatic negative GI ROS, Neg liver ROS,   Endo/Other  Morbid obesity  Renal/GU negative Renal ROS  negative genitourinary   Musculoskeletal negative musculoskeletal ROS (+)   Abdominal (+) + obese,   Peds negative pediatric ROS (+)  Hematology negative hematology ROS (+)   Anesthesia Other Findings Umbilical hernia  Reproductive/Obstetrics negative OB ROS                             Anesthesia Physical  Anesthesia Plan  ASA: III  Anesthesia Plan: General   Post-op Pain Management:    Induction: Intravenous  PONV Risk Score and Plan: 4 or greater and Ondansetron, Midazolam and Dexamethasone  Airway Management Planned: Oral ETT  Additional Equipment:   Intra-op Plan:   Post-operative Plan: Extubation in OR  Informed Consent: I have reviewed the patients History and Physical, chart, labs and discussed the procedure including the risks, benefits and alternatives for the proposed anesthesia with the patient or authorized representative who has indicated his/her understanding and acceptance.   Dental advisory given  Plan Discussed with: CRNA and Surgeon  Anesthesia Plan Comments:         Anesthesia Quick Evaluation

## 2018-02-11 NOTE — Op Note (Addendum)
02/10/2018 - 02/11/2018  9:54 AM  PATIENT:  Frank Howe  42 y.o. male  Patient Care Team: Patient, No Pcp Per as PCP - General (General Practice)  PRE-OPERATIVE DIAGNOSIS:   1. Small bowel obstruction 2. Recurrent umbilical hernia  POST-OPERATIVE DIAGNOSIS:  Same  PROCEDURE:   1. Laparoscopic repair of recurrent umbilical hernia with mesh 2. Laparoscopic lysis of adhesions   SURGEON:  Stephanie Coup. Amenah Tucci, MD  ASSISTANT: Scrub nurse  ANESTHESIA:   local and general  COUNTS:  Sponge, needle and instrument counts were reported correct x2 at the conclusion of the operation.  EBL: 10cc  DRAINS: None  SPECIMEN: None  COMPLICATIONS: None  FINDINGS: Adhesive band with associated transition point - band was to previously placed mesh.  This was taken down laparoscopically.  The bowel distal to this band was decompressed.  The bowel proximal to this was dilated.  There was a recurrent hernia at the umbilicus with the mesh rolled up cephalad.  The defect measured 5 x 5 cm.  A periumbilical incision was created and the hernia sac removed.  The fascia was cleared and closed primarily.  An 11.4 x 11.4 cm piece of dual sided mesh - ventralight ST - with the fatty acid coated side against the bowel was used to reinforce the repair.  DISPOSITION: PACU in satisfactory condition  INDICATION: Mr. Danielson is a very pleasant 42 year old male with a history of umbilical hernia which he had repaired back in May of this year.  This was done by overlapping a piece of mesh at the repair.  He returned the emergency room overnight with a bowel obstruction and transition point at the site of a recurrent umbilical hernia.  He is admitted by my partner.  I saw the patient this morning and discussed options going forward.  Given that he had a small bowel obstruction in the transition point at this hernia, operative repair was recommended.  Please refer to H&P for all details regarding this  discussion.  DESCRIPTION: The patient was identified in preop holding and taken to the OR where they were placed on the operating room table and SCDs were placed. General endotracheal anesthesia was induced without difficulty. The patient was then prepped and draped in the usual sterile fashion. A surgical timeout was performed indicating the correct patient, procedure, positioning and need for preoperative antibiotics.   An NG tube had been placed by anesthesia and was to suction.  At Palmer's point, a stab incision was created and a Veress needle was inserted into the perineal cavity.  Intraperitoneal location was confirmed with aspiration and a saline drop test.  The peritoneal cavity then insufflated to 15 mmHg with CO2.  A 5 mm trocar was placed under direct visualization into the perineal cavity.  The laparoscope was inserted.  Inspection revealed no evidence of trocar site complications.  The abdomen was surveyed.  There was an adhesive band to the prior mesh and the mesh appeared to have rolled up cephalad to the previous repair.  2 additional 5 mm ports were placed in the left hemiabdomen and one additional 5 mill meter port in the right abdomen.  These were all done under direct visualization.  Laparoscopic adhesio lysis commenced taking down the adhesive band where an associate transition point was noted.  After doing this, the bowel laid nicely in the abdomen and there is no evidence of kinking or obstruction.  The bowel was then inspected and there was no evidence of deserosalization or injury.  The bowel was clearly viable and pink.  The defect was measured and noted to be approximately 5 x 5 cm in circular.  A periumbilical incision was then created and carried down to the subcutaneous tissue.  The fascia was circumferentially identified and exposed and the hernia sac was excised with electrocautery. This included a piece of the prior mesh repair. The fascial edges were grasped between Kocher  clamps and came together without any difficulty or tension.  A wound protector was placed and the small intestine was reevaluated.  Again, there was no evidence of injury to the bowel and the bowel was viable.  This was placed back in the peritoneal cavity.  After ensuring that the fascia was completely cleared, a piece of ventral light ST mesh which was 11.4 cm x 11.4 cm was opened onto the field.  4-0 Ethibond sutures were then placed on the left side at 12, 3, 6, and 9:00 positions.  These were tied.  Ensuring that the fatty acid coated side was down face in the bowel, the mesh was then inserted in the peritoneal cavity.  The fascial defect was then closed with multiple interrupted #1 Novafil sutures and subsequently reinforced with a couple 0 Ethibond sutures.  The fascial defect was palpated and noted to be completely closed.  The peritoneal cavity was reinsufflated and inspection revealed the closure to be completely intact.  Using a laparoscopic suture passer, the 4 anchoring sutures were pulled through the abdominal wall and sequentially tied.  The mesh had a nice flat leg against the abdominal wall.  This was subsequently reinforced with 2 crowns of secure strap absorbable tackers.  The abdominal wall was hemostatic.  Local anesthetic had been infiltrated at the port sites as well as the location of the anchoring sutures.  The laparoscopic trochars were removed under direct visualization and noted to be hemostatic.  Prior to doing this, the pneumoperitoneum had been exhausted from the abdomen. The umbilical incision was irrigated.  The skin of all incision sites was closed with 4-0 Monocryl subcuticular sutures.  There was some excess umbilical skin which was excised and the umbilical incision was closed with a running 4-0 Monocryl subcuticular suture.  Dermabond was placed over the incision sites.  2 x 2 gauze and a Tegaderm were applied to the umbilical incision. An abdominal binder was placed. The  patient was then awakened from general anesthesia, extubated, and transferred to a stretcher for transport to PACU in satisfactory condition.

## 2018-02-11 NOTE — ED Notes (Signed)
RN removed Ng tube. Abd Xray showed NG tube was coiled twice. Patient stated give him some time to rest and do it again.

## 2018-02-11 NOTE — ED Notes (Addendum)
Patient refused to start NGT placement at this time. Pt stated give him a moment to be ready.

## 2018-02-11 NOTE — ED Provider Notes (Signed)
[facility]-EMERGENCY DEPT Provider Note   CSN: [id number] Arrival date & time: 02/10/18  2344     History   Chief Complaint Chief Complaint  Patient presents with  . Nausea    HPI Frank Howe is a 42 y.o. male.  The history is provided by the patient and medical records.     42 y.o. M with hx of HTN, gout, obesity, OSA, presenting to the ED for abdominal pain, nausea, vomiting.  States he has been having some upper abdominal pain intermittently for the past week.  States today he started feeling acutely worse, increased pain and nausea/vomiting.  Wife states it looks like bile.  States he ate some spaghetti today around 1 PM but otherwise has not eaten anything today.  Has not been able to drink much liquids.  Did have a BM this morning but was very small compared to his normal.  He has history of SBO, prior umbilical hernia repair.  He has not tried any medications for his symptoms.  Past Medical History:  Diagnosis Date  . Elevated blood pressure reading without diagnosis of hypertension   . Gout   . Impaired fasting blood sugar   . Obesity   . OSA (obstructive sleep apnea)     Patient Active Problem List   Diagnosis Date Noted  . Partial small bowel obstruction (HCC) 09/13/2017  . Gout, chronic 07/19/2015  . OSA (obstructive sleep apnea) 07/19/2015  . Obesity 06/29/2015  . Elevated uric acid in blood 06/29/2015  . Impaired fasting blood sugar 06/28/2015  . Knee swelling 06/28/2015  . Right knee pain 06/28/2015  . Elevated blood pressure reading without diagnosis of hypertension 06/28/2015  . Routine general medical examination at a health care facility 08/21/2011    Past Surgical History:  Procedure Laterality Date  . UMBILICAL HERNIA REPAIR N/A 09/14/2017   Procedure: LAPAROSCOPIC UMBILICAL HERNIA;  Surgeon: Romie Levee, MD;  Location: [facility] ORS;  Service: General;  Laterality: N/A;        Home Medications    Prior to Admission  medications   Medication Sig Start Date End Date Taking? Authorizing Provider  amLODipine (NORVASC) 10 MG tablet Take 10 mg by mouth daily. 09/07/17   [provider]  colchicine 0.6 MG tablet Take 0.6 mg by mouth 2 (two) times daily.    [provider]  metoprolol succinate (TOPROL-XL) 25 MG 24 hr tablet Take 25 mg by mouth daily. 09/07/17   [provider]  oxyCODONE (OXY IR/ROXICODONE) 5 MG immediate release tablet Take 1 tablet (5 mg total) by mouth every 6 (six) hours as needed for moderate pain. 09/16/17   Glenna Fellows, MD    Family History Family History  Problem Relation Age of Onset  . Arthritis Mother   . Hypertension Mother   . Stroke Mother   . Aneurysm Mother        died of brain aneurysm  . Asthma Father   . Cancer Father        ?  Marland Kitchen Arthritis Father   . Heart disease Maternal Uncle   . Diabetes Maternal Uncle   . Alcohol abuse Neg Hx   . Hyperlipidemia Neg Hx   . Hearing loss Neg Hx   . Kidney disease Neg Hx   . Early death Neg Hx     Social History Social History   Tobacco Use  . Smoking status: Former Smoker    Packs/day: 0.25    Years: 4.00  Pack years: 1.00    Types: Cigarettes    Last attempt to quit: 12/29/2014    Years since quitting: 3.1  . Smokeless tobacco: Never Used  . Tobacco comment: once every 2 wk  Substance Use Topics  . Alcohol use: Yes    Alcohol/week: 0.0 standard drinks  . Drug use: No     Allergies   Patient has no known allergies.   Review of Systems Review of Systems  Gastrointestinal: Positive for abdominal distention, abdominal pain, nausea and vomiting.  All other systems reviewed and are negative.    Physical Exam Updated Vital Signs BP (!) 192/98   Pulse (!) 101   Temp 98.2 F (36.8 C) (Oral)   Resp 16   Ht 5\' 8"  (1.727 m)   Wt 132.9 kg   SpO2 96%   BMI 44.55 kg/m   Physical Exam  Constitutional: He is oriented to person, place, and time. He appears well-developed and  well-nourished.  HENT:  Head: Normocephalic and atraumatic.  Mouth/Throat: Oropharynx is clear and moist.  Eyes: Pupils are equal, round, and reactive to light. Conjunctivae and EOM are normal.  Neck: Normal range of motion.  Cardiovascular: Normal rate, regular rhythm and normal heart sounds.  Pulmonary/Chest: Effort normal and breath sounds normal.  Abdominal: Soft. Bowel sounds are decreased. There is tenderness.  Obese abdomen, somewhat firm to touch but no rigidity; decreased bowel sounds, diffuse mild tenderness Umbilical hernia, reducible  Musculoskeletal: Normal range of motion.  Neurological: He is alert and oriented to person, place, and time.  Skin: Skin is warm and dry.  Psychiatric: He has a normal mood and affect.  Nursing note and vitals reviewed.    ED Treatments / Results  Labs (all labs ordered are listed, but only abnormal results are displayed) Labs Reviewed  COMPREHENSIVE METABOLIC PANEL - Abnormal; Notable for the following components:      Result Value   Glucose, Bld 161 (*)    Creatinine, Ser 1.35 (*)    Total Protein 8.7 (*)    All other components within normal limits  CBC - Abnormal; Notable for the following components:   WBC 14.7 (*)    All other components within normal limits  URINALYSIS, ROUTINE W REFLEX MICROSCOPIC - Abnormal; Notable for the following components:   Color, Urine AMBER (*)    APPearance HAZY (*)    Specific Gravity, Urine 1.031 (*)    Ketones, ur 5 (*)    Protein, ur 100 (*)    Bacteria, UA RARE (*)    All other components within normal limits  LIPASE, BLOOD    EKG None  Radiology Ct Abdomen Pelvis W Contrast  Result Date: 02/11/2018 CLINICAL DATA:  Abdominal pain, vomiting, and distension. History of small bowel obstruction. EXAM: CT ABDOMEN AND PELVIS WITH CONTRAST TECHNIQUE: Multidetector CT imaging of the abdomen and pelvis was performed using the standard protocol following bolus administration of intravenous  contrast. CONTRAST:  ISOVUE-300 IOPAMIDOL (ISOVUE-300) INJECTION 61% COMPARISON:  CT 09/08/2017. FINDINGS: Lower chest: Scattered atelectasis and hypoventilatory change. No confluent consolidation or pleural fluid. Hepatobiliary: Prominent size liver spanning 20 cm cranial caudal. No focal lesion. Gallbladder physiologically distended, no calcified stone. No biliary dilatation. Pancreas: No ductal dilatation or inflammation. Spleen: Normal in size without focal abnormality. Adrenals/Urinary Tract: Normal adrenal glands. No hydronephrosis or perinephric edema. Homogeneous renal enhancement with symmetric excretion on delayed phase imaging. Urinary bladder is partially distended without wall thickening. Stomach/Bowel: Fluid-filled stomach and dilated fluid-filled proximal small  bowel. Transition point within an umbilical hernia. Hernia neck measures 5 x 3.4 cm. Stranding and small amount of free fluid in the hernia sac. No pneumatosis or free air. Exiting small bowel is decompressed. Normal appendix. Small volume of stool throughout the colon. Vascular/Lymphatic: Mild aorta bi-iliac atherosclerosis. No adenopathy. Reproductive: Prostate is unremarkable. Other: No free air or intra-abdominal abscess. Umbilical hernia containing fat and obstructed small bowel with the small amount of free fluid. No other ascites. Musculoskeletal: Degenerative change of both hips. There are no acute or suspicious osseous abnormalities. IMPRESSION: Small bowel obstruction with transition point in an umbilical hernia. Electronically Signed   By: Narda Rutherford M.D.   On: 02/11/2018 01:57    Procedures Procedures (including critical care time)  Medications Ordered in ED Medications  morphine 4 MG/ML injection 4 mg (4 mg Intravenous Not Given 02/11/18 0040)  iopamidol (ISOVUE-300) 61 % injection (has no administration in time range)  sodium chloride 0.9 % injection (has no administration in time range)  ondansetron  (ZOFRAN) injection 4 mg (4 mg Intravenous Given 02/11/18 0040)  iopamidol (ISOVUE-300) 61 % injection 100 mL (100 mLs Intravenous Contrast Given 02/11/18 0134)  lidocaine (XYLOCAINE) 2 % viscous mouth solution 15 mL (15 mLs Mouth/Throat Given 02/11/18 0238)  fentaNYL (SUBLIMAZE) injection 50 mcg (50 mcg Intravenous Given 02/11/18 0238)     Initial Impression / Assessment and Plan / ED Course  I have reviewed the triage vital signs and the nursing notes.  Pertinent labs & imaging results that were available during my care of the patient were reviewed by me and considered in my medical decision making (see chart for details).  42 year old male presenting to the ED with abdominal pain, nausea, vomiting.  Has been having abdominal pain intermittently for the past week that began vomiting today.  Is also had change in bowel habits with bowel movement earlier today smaller than normal.  He is afebrile and nontoxic.  Has obese abdomen, unsure if this is any more distended than normal.  Abdomen feels firm but is not rigid.  Does have decreased bowel sounds.  Also has umbilical hernia that is reducible.  Concern for SBO, patient has history of same related to umbilical hernia.  Had this repaired in May 2019 but seems to have recurrence on exam.  Possibly due to his obesity and body habitus.  Will plan for CT.  CT with findings of SBO, transition point at umbilical hernia.  NG tube to be placed.  Discussed with general surgery, Dr. Johna Sheriff, will admit for ongoing care.  Final Clinical Impressions(s) / ED Diagnoses   Final diagnoses:  SBO (small bowel obstruction) (HCC)  Umbilical hernia with obstruction, without gangrene    ED Discharge Orders    None       Garlon Hatchet, PA-C 02/11/18 0431    Derwood Kaplan, MD 02/11/18 [phone]

## 2018-02-11 NOTE — Transfer of Care (Signed)
Immediate Anesthesia Transfer of Care Note  Patient: Frank Howe  Procedure(s) Performed: LAPARASCOPIC REDO REPAIR OF RECURRENT UMBILICAL HERNIA WITH INSERTION OF MESH, LYSIS OF ADHESIONS (N/A Abdomen)  Patient Location: PACU  Anesthesia Type:General  Level of Consciousness: awake, alert  and oriented  Airway & Oxygen Therapy: Patient Spontanous Breathing and Patient connected to face mask oxygen  Post-op Assessment: Report given to RN and Post -op Vital signs reviewed and stable  Post vital signs: Reviewed and stable  Last Vitals:  Vitals Value Taken Time  BP 181/102 02/11/2018 10:08 AM  Temp    Pulse 102 02/11/2018 10:09 AM  Resp 8 02/11/2018 10:09 AM  SpO2 95 % 02/11/2018 10:09 AM  Vitals shown include unvalidated device data.  Last Pain:  Vitals:   02/11/18 0515  TempSrc:   PainSc: 0-No pain         Complications: No apparent anesthesia complications

## 2018-02-11 NOTE — Progress Notes (Signed)
Patient arrived on the unit at approximately 0456. He is alert and verbally responsive. Vomited moderate amount of brown undigested food on his bed and floor. He still denies NGT placement at this time. Will continue to monitor. Will give Zofran 4 mg IV.

## 2018-02-11 NOTE — Anesthesia Postprocedure Evaluation (Signed)
Anesthesia Post Note  Patient: Payton Prinsen  Procedure(s) Performed: LAPARASCOPIC REDO REPAIR OF RECURRENT UMBILICAL HERNIA WITH INSERTION OF MESH, LYSIS OF ADHESIONS (N/A Abdomen)     Patient location during evaluation: PACU Anesthesia Type: General Level of consciousness: awake Pain management: pain level controlled Vital Signs Assessment: post-procedure vital signs reviewed and stable Respiratory status: spontaneous breathing Cardiovascular status: stable Postop Assessment: no apparent nausea or vomiting Anesthetic complications: no    Last Vitals:  Vitals:   02/11/18 1050 02/11/18 1055  BP:  (!) 178/91  Pulse:  (!) 102  Resp:  (!) 7  Temp:    SpO2: 92% 94%    Last Pain:  Vitals:   02/11/18 1015  TempSrc:   PainSc: Asleep   Pain Goal:                 Caren Macadam

## 2018-02-11 NOTE — H&P (Signed)
Frank Howe is an 42 y.o. male.  Chief Complaint: Abdominal pain, nausea and vomiting  HPI: 42 year old male with a history of laparoscopic repair of a primary umbilical hernia by Dr. Marcello Moores in April of this year using an 11.5 cm VentralLight dual sided mesh underlay.  Patient had intermittent small bowel obstruction from a reducible hernia at that time.  Did well until about 1 week ago.  Since then he has been experiencing intermittent pain at his umbilicus and also intermittent more random or generalized abdominal pain.  Last night he developed frequent nausea and vomiting in association with the pain that prompted him to come into the emergency department.  He does heavy lifting at work.  Past Medical History:  Diagnosis Date  . Elevated blood pressure reading without diagnosis of hypertension   . Gout   . Impaired fasting blood sugar   . Obesity   . OSA (obstructive sleep apnea)     Past Surgical History:  Procedure Laterality Date  . UMBILICAL HERNIA REPAIR N/A 09/14/2017   Procedure: LAPAROSCOPIC UMBILICAL HERNIA;  Surgeon: Leighton Ruff, MD;  Location: [facility];  Service: General;  Laterality: N/A;    Family History  Problem Relation Age of Onset  . Arthritis Mother   . Hypertension Mother   . Stroke Mother   . Aneurysm Mother        died of brain aneurysm  . Asthma Father   . Cancer Father        ?  Marland Kitchen Arthritis Father   . Heart disease Maternal Uncle   . Diabetes Maternal Uncle   . Alcohol abuse Neg Hx   . Hyperlipidemia Neg Hx   . Hearing loss Neg Hx   . Kidney disease Neg Hx   . Early death Neg Hx    Social History:  reports that he quit smoking about 3 years ago. His smoking use included cigarettes. He has a 1.00 pack-year smoking history. He has never used smokeless tobacco. He reports that he drinks alcohol. He reports that he does not use drugs.  Allergies: No Known Allergies  Current Facility-Administered Medications  Medication Dose Route Frequency Provider  Last Rate Last Dose  . iopamidol (ISOVUE-300) 61 % injection           . lidocaine (XYLOCAINE) 2 % jelly 1 application  1 application Other Once Nanavati, Ankit, MD      . morphine 4 MG/ML injection 4 mg  4 mg Intravenous Once Quincy Carnes M, PA-C      . sodium chloride 0.9 % injection            Current Outpatient Medications  Medication Sig Dispense Refill  . oxyCODONE (OXY IR/ROXICODONE) 5 MG immediate release tablet Take 1 tablet (5 mg total) by mouth every 6 (six) hours as needed for moderate pain. (Patient not taking: Reported on 02/11/2018) 10 tablet 0    Results for orders placed or performed during the hospital encounter of 02/10/18 (from the past 48 hour(s))  Urinalysis, Routine w reflex microscopic     Status: Abnormal   Collection Time: 02/11/18 12:03 AM  Result Value Ref Range   Color, Urine AMBER (A) YELLOW    Comment: BIOCHEMICALS MAY BE AFFECTED BY COLOR   APPearance HAZY (A) CLEAR   Specific Gravity, Urine 1.031 (H) 1.005 - 1.030   pH 5.0 5.0 - 8.0   Glucose, UA NEGATIVE NEGATIVE mg/dL   Hgb urine dipstick NEGATIVE NEGATIVE   Bilirubin Urine NEGATIVE NEGATIVE  Ketones, ur 5 (A) NEGATIVE mg/dL   Protein, ur 100 (A) NEGATIVE mg/dL   Nitrite NEGATIVE NEGATIVE   Leukocytes, UA NEGATIVE NEGATIVE   RBC / HPF 0-5 0 - 5 RBC/hpf   WBC, UA 6-10 0 - 5 WBC/hpf   Bacteria, UA RARE (A) NONE SEEN   Squamous Epithelial / LPF 0-5 0 - 5   Mucus PRESENT    Hyaline Casts, UA PRESENT    Granular Casts, UA PRESENT     Comment: Performed at [facility], [place] [address], [place], [place] [address]  Lipase, blood     Status: None   Collection Time: 02/11/18 12:14 AM  Result Value Ref Range   Lipase 23 11 - 51 U/L    Comment: Performed at [facility], [place] [address] [place], [place] [address]  Comprehensive metabolic panel     Status: Abnormal   Collection Time: 02/11/18 12:14 AM  Result Value Ref Range   Sodium 140 135 - 145 mmol/L    Potassium 3.5 3.5 - 5.1 mmol/L   Chloride 100 98 - 111 mmol/L   CO2 27 22 - 32 mmol/L   Glucose, Bld 161 (H) 70 - 99 mg/dL   BUN 12 6 - 20 mg/dL   Creatinine, Ser 1.35 (H) 0.61 - 1.24 mg/dL   Calcium 9.1 8.9 - 10.3 mg/dL   Total Protein 8.7 (H) 6.5 - 8.1 g/dL   Albumin 4.0 3.5 - 5.0 g/dL   AST 27 15 - 41 U/L   ALT 29 0 - 44 U/L   Alkaline Phosphatase 96 38 - 126 U/L   Total Bilirubin 1.0 0.3 - 1.2 mg/dL   GFR calc non Af Amer >60 >60 mL/min   GFR calc Af Amer >60 >60 mL/min    Comment: (NOTE) The eGFR has been calculated using the CKD EPI equation. This calculation has not been validated in all clinical situations. eGFR's persistently <60 mL/min signify possible Chronic Kidney Disease.    Anion gap 13 5 - 15    Comment: Performed at [facility], [place] [address], [place], [place] [address]  CBC     Status: Abnormal   Collection Time: 02/11/18 12:14 AM  Result Value Ref Range   WBC 14.7 (H) 4.0 - 10.5 K/uL   RBC 5.59 4.22 - 5.81 MIL/uL   Hemoglobin 14.7 13.0 - 17.0 g/dL   HCT 47.9 39.0 - 52.0 %   MCV 85.7 80.0 - 100.0 fL   MCH 26.3 26.0 - 34.0 pg   MCHC 30.7 30.0 - 36.0 g/dL   RDW 14.2 11.5 - 15.5 %   Platelets 346 150 - 400 K/uL   nRBC 0.0 0.0 - 0.2 %    Comment: Performed at [facility], [place] [address] [place], [place] [address]   Ct Abdomen Pelvis W Contrast  Result Date: 02/11/2018 CLINICAL DATA:  Abdominal pain, vomiting, and distension. History of small bowel obstruction. EXAM: CT ABDOMEN AND PELVIS WITH CONTRAST TECHNIQUE: Multidetector CT imaging of the abdomen and pelvis was performed using the standard protocol following bolus administration of intravenous contrast. CONTRAST:  148m ISOVUE-300 IOPAMIDOL (ISOVUE-300) INJECTION 61% COMPARISON:  CT 09/08/2017. FINDINGS: Lower chest: Scattered atelectasis and hypoventilatory change. No confluent consolidation or pleural fluid. Hepatobiliary: Prominent size liver spanning 20 cm  cranial caudal. No focal lesion. Gallbladder physiologically distended, no calcified stone. No biliary dilatation. Pancreas: No ductal dilatation or inflammation. Spleen: Normal in size without focal abnormality. Adrenals/Urinary Tract: Normal  adrenal glands. No hydronephrosis or perinephric edema. Homogeneous renal enhancement with symmetric excretion on delayed phase imaging. Urinary bladder is partially distended without wall thickening. Stomach/Bowel: Fluid-filled stomach and dilated fluid-filled proximal small bowel. Transition point within an umbilical hernia. Hernia neck measures 5 x 3.4 cm. Stranding and small amount of free fluid in the hernia sac. No pneumatosis or free air. Exiting small bowel is decompressed. Normal appendix. Small volume of stool throughout the colon. Vascular/Lymphatic: Mild aorta bi-iliac atherosclerosis. No adenopathy. Reproductive: Prostate is unremarkable. Other: No free air or intra-abdominal abscess. Umbilical hernia containing fat and obstructed small bowel with the small amount of free fluid. No other ascites. Musculoskeletal: Degenerative change of both hips. There are no acute or suspicious osseous abnormalities. IMPRESSION: Small bowel obstruction with transition point in an umbilical hernia. Electronically Signed   By: Keith Rake M.D.   On: 02/11/2018 01:57    Review of Systems  Constitutional: Negative for chills and fever.  Respiratory: Positive for shortness of breath (Intermittent for the past week).   Cardiovascular: Negative.   Gastrointestinal: Positive for abdominal pain, nausea and vomiting. Negative for constipation and diarrhea.    Blood pressure (!) 178/102, pulse 83, temperature 98.2 F (36.8 C), temperature source Oral, resp. rate 19, height 5' 8" (1.727 m), weight 132.9 kg, SpO2 94 %. Physical Exam  General: Alert, pleasant, obese African-American male, in no distress Skin: Warm and dry without rash or infection. HEENT: No palpable masses  or thyromegaly. Sclera nonicteric. Pupils equal round and reactive.  Lungs: Breath sounds clear and equal without increased work of breathing Cardiovascular: Regular rate and rhythm without murmur. No JVD or edema. Peripheral pulses intact. Abdomen: Obese.  Probable moderate distention.   Soft and nontender. No masses palpable. No organomegaly.  There is a 4 to 5 cm soft nontender reducible umbilical hernia. Extremities: No edema or joint swelling or deformity. No chronic venous stasis changes. Neurologic: Alert and fully oriented.  Affect normal.  No gross motor deficits.   Assessment/Plan Current umbilical hernia following history of laparoscopic repair with mesh.  Small bowel obstruction at the site of the hernia.  His hernia however is soft and nontender and appears completely reducible. Patient will be admitted, NG and IV hydration.  He will need urgent repair of his recurrent hernia.  Discussed with the patient and his wife and all questions answered.  Edward Jolly, MD 02/11/2018, 3:20 AM

## 2018-02-12 ENCOUNTER — Encounter (HOSPITAL_COMMUNITY): Payer: Self-pay | Admitting: Surgery

## 2018-02-12 LAB — CBC
HCT: 42.5 % (ref 39.0–52.0)
Hemoglobin: 12.8 g/dL — ABNORMAL LOW (ref 13.0–17.0)
MCH: 26.4 pg (ref 26.0–34.0)
MCHC: 30.1 g/dL (ref 30.0–36.0)
MCV: 87.6 fL (ref 80.0–100.0)
Platelets: 279 10*3/uL (ref 150–400)
RBC: 4.85 MIL/uL (ref 4.22–5.81)
RDW: 14.7 % (ref 11.5–15.5)
WBC: 18.5 10*3/uL — ABNORMAL HIGH (ref 4.0–10.5)
nRBC: 0 % (ref 0.0–0.2)

## 2018-02-12 LAB — BASIC METABOLIC PANEL
Anion gap: 6 (ref 5–15)
BUN: 15 mg/dL (ref 6–20)
CO2: 32 mmol/L (ref 22–32)
Calcium: 8.6 mg/dL — ABNORMAL LOW (ref 8.9–10.3)
Chloride: 105 mmol/L (ref 98–111)
Creatinine, Ser: 1.22 mg/dL (ref 0.61–1.24)
GFR calc Af Amer: >60 mL/min (ref >60)
GFR calc non Af Amer: >60 mL/min (ref >60)
Glucose, Bld: 110 mg/dL — ABNORMAL HIGH (ref 70–99)
Potassium: 3.9 mmol/L (ref 3.5–5.1)
Sodium: 143 mmol/L (ref 135–145)

## 2018-02-12 MED ORDER — HYDRALAZINE HCL 20 MG/ML IJ SOLN: 10.0000 mg | INTRAMUSCULAR | Status: DC | PRN

## 2018-02-12 MED ORDER — HYDROMORPHONE HCL 1 MG/ML IJ SOLN
0.5000 mg | INTRAMUSCULAR | Status: DC | PRN
  Administered 2018-02-13: 0.5 mg via INTRAVENOUS
  Filled 2018-02-12: qty 0.5

## 2018-02-12 MED ORDER — METHOCARBAMOL 1000 MG/10ML IJ SOLN
500.0000 mg | Freq: Three times a day (TID) | INTRAVENOUS | Status: DC | PRN
  Filled 2018-02-12: qty 5

## 2018-02-12 MED ORDER — MENTHOL 3 MG MT LOZG
1.0000 | LOZENGE | OROMUCOSAL | Status: DC | PRN
  Administered 2018-02-12: 3 mg via ORAL
  Filled 2018-02-12: qty 9

## 2018-02-12 NOTE — Progress Notes (Signed)
[facility] Progress Note  1 Day Post-Op  Subjective: CC-  Main complaint this morning is NG tube, states that it is making his throat very sore. Throat is hurting more than his abdomen. Denies abdominal bloating. Denies n/v. No flatus or BM.  Objective: Vital signs in last 24 hours: Temp:  [97.7 F (36.5 C)-99.4 F (37.4 C)] 98.4 F (36.9 C) (10/22 0433) Pulse Rate:  [66-107] 66 (10/22 0433) Resp:  [11-30] 17 (10/22 0433) BP: (151-184)/(79-106) 161/85 (10/22 0433) SpO2:  [92 %-96 %] 96 % (10/22 0433) Last BM Date: 02/09/18  Intake/Output from previous day: 10/21 0701 - 10/22 0700 In: 3165.4 [I.V.:3165.4] Out: 4450 [Urine:1475; Emesis/NG output:2750; Blood:25] Intake/Output this shift: No intake/output data recorded.  PE: Gen:  Alert, NAD, pleasant HEENT: EOM's intact, pupils equal and round Card:  RRR Pulm:  CTAB, no W/R/R, effort normal Abd: Soft, mild distension, hypoactive BS, periumbilical incision with cdi dressing in place/ other lap incisions cdi Skin: no rashes noted, warm and dry  Lab Results:  Recent Labs    02/11/18 0014 02/12/18 0603  WBC 14.7* 18.5*  HGB 14.7 12.8*  HCT 47.9 42.5  PLT 346 279   BMET Recent Labs    02/11/18 0014 02/12/18 0603  NA 140 143  K 3.5 3.9  CL 100 105  CO2 27 32  GLUCOSE 161* 110*  BUN 12 15  CREATININE 1.35* 1.22  CALCIUM 9.1 8.6*   PT/INR No results for input(s): LABPROT, INR in the last 72 hours. CMP     Component Value Date/Time   NA 143 02/12/2018 0603   K 3.9 02/12/2018 0603   CL 105 02/12/2018 0603   CO2 32 02/12/2018 0603   GLUCOSE 110 (H) 02/12/2018 0603   BUN 15 02/12/2018 0603   CREATININE 1.22 02/12/2018 0603   CREATININE 1.11 12/16/2012 1424   CALCIUM 8.6 (L) 02/12/2018 0603   PROT 8.7 (H) 02/11/2018 0014   ALBUMIN 4.0 02/11/2018 0014   AST 27 02/11/2018 0014   ALT 29 02/11/2018 0014   ALKPHOS 96 02/11/2018 0014   BILITOT 1.0 02/11/2018 0014   GFRNONAA >60 02/12/2018 0603    GFRAA >60 02/12/2018 0603   Lipase     Component Value Date/Time   LIPASE 23 02/11/2018 0014       Studies/Results: Dg Chest 2 View  Result Date: 02/11/2018 CLINICAL DATA:  Preop hernia repair. EXAM: CHEST - 2 VIEW COMPARISON:  Abdominal radiographs and CT earlier this day. FINDINGS: Enteric tube has been removed. Lung volumes are low. Minor bibasilar atelectasis. Heart size is normal. No pleural effusion or pneumothorax. Distended stomach with air-fluid level. No acute osseous abnormalities. IMPRESSION: Low lung volumes with minor bibasilar atelectasis. Electronically Signed   By: Narda Rutherford M.D.   On: 02/11/2018 07:00   Ct Abdomen Pelvis W Contrast  Result Date: 02/11/2018 CLINICAL DATA:  Abdominal pain, vomiting, and distension. History of small bowel obstruction. EXAM: CT ABDOMEN AND PELVIS WITH CONTRAST TECHNIQUE: Multidetector CT imaging of the abdomen and pelvis was performed using the standard protocol following bolus administration of intravenous contrast. CONTRAST:  ISOVUE-300 IOPAMIDOL (ISOVUE-300) INJECTION 61% COMPARISON:  CT 09/08/2017. FINDINGS: Lower chest: Scattered atelectasis and hypoventilatory change. No confluent consolidation or pleural fluid. Hepatobiliary: Prominent size liver spanning 20 cm cranial caudal. No focal lesion. Gallbladder physiologically distended, no calcified stone. No biliary dilatation. Pancreas: No ductal dilatation or inflammation. Spleen: Normal in size without focal abnormality. Adrenals/Urinary Tract: Normal adrenal glands. No hydronephrosis or perinephric edema.  Homogeneous renal enhancement with symmetric excretion on delayed phase imaging. Urinary bladder is partially distended without wall thickening. Stomach/Bowel: Fluid-filled stomach and dilated fluid-filled proximal small bowel. Transition point within an umbilical hernia. Hernia neck measures 5 x 3.4 cm. Stranding and small amount of free fluid in the hernia sac. No  pneumatosis or free air. Exiting small bowel is decompressed. Normal appendix. Small volume of stool throughout the colon. Vascular/Lymphatic: Mild aorta bi-iliac atherosclerosis. No adenopathy. Reproductive: Prostate is unremarkable. Other: No free air or intra-abdominal abscess. Umbilical hernia containing fat and obstructed small bowel with the small amount of free fluid. No other ascites. Musculoskeletal: Degenerative change of both hips. There are no acute or suspicious osseous abnormalities. IMPRESSION: Small bowel obstruction with transition point in an umbilical hernia. Electronically Signed   By: Narda Rutherford M.D.   On: 02/11/2018 01:57   Dg Abd Portable 1 View  Result Date: 02/11/2018 CLINICAL DATA:  NG tube placement. EXAM: PORTABLE ABDOMEN - 1 VIEW COMPARISON:  CT earlier this day. FINDINGS: The enteric tube is looped twice in the distal esophagus, tip in the region of the gastroesophageal junction. Gaseous gastric distension noted in the upper abdomen. No evidence of free air. Excreted IV contrast in both renal collecting systems. IMPRESSION: The enteric tube is coiled twice in the distal esophagus. The tip is in the region of the gastroesophageal junction. It is unclear if advancing the tube would advance the coiled portion into the stomach, or removal and replacement is necessary. Electronically Signed   By: Narda Rutherford M.D.   On: 02/11/2018 04:18    Anti-infectives: Anti-infectives (From admission, onward)   Start     Dose/Rate Route Frequency Ordered Stop   02/11/18 0630  ceFAZolin (ANCEF) 3 g in dextrose 5 % 50 mL IVPB     3 g 100 mL/hr over 30 Minutes Intravenous To Surgery 02/11/18 0518 02/11/18 0758   02/11/18 0515  ceFAZolin (ANCEF) powder 3 g  Status:  Discontinued     3 g Other To Surgery 02/11/18 0514 02/11/18 0517       Assessment/Plan SBO, Recurrent umbilical hernia S/p Laparoscopic repair of recurrent umbilical hernia with mesh, lysis of adhesions 10/21 Dr.  Cliffton Asters - POD 1 - continue abdominal binder - no flatus/BM  ID - ancef periop FEN - IVF, NPO/NGT to LIWS VTE - SCDs, sq heparin Foley - out 10/21 Follow up - Dr. Cliffton Asters  Plan - Continue NPO/NGT until return in bowel function. Encourage OOB/ambulation today. Labs in AM.   LOS: 1 day    Franne Forts , [facility] 02/12/2018, 9:34 AM Pager: [phone] Mon 7:00 am -11:30 AM Tues-Fri 7:00 am-4:30 pm Sat-Sun 7:00 am-11:30 am

## 2018-02-12 NOTE — Progress Notes (Signed)
Per pt, cpap held until NG tube removed.  RT will continue to follow.

## 2018-02-12 NOTE — Progress Notes (Signed)
Pt. has NG tube in placed, CPAP unable to be placed.

## 2018-02-12 NOTE — Discharge Instructions (Signed)
[facility]  UMBILICAL HERNIA REPAIR: POST OP INSTRUCTIONS  Always review your discharge instruction sheet given to you by the facility where your surgery was performed. IF YOU HAVE DISABILITY OR FAMILY LEAVE FORMS, YOU MUST BRING THEM TO THE OFFICE FOR PROCESSING.    1. A  prescription for pain medication may be given to you upon discharge.  Take your pain medication as prescribed, if needed.  If narcotic pain medicine is not needed, then you may take acetaminophen (Tylenol) or ibuprofen (Advil) as needed. 2. Take your usually prescribed medications unless otherwise directed. If you need a refill on your pain medication, please contact your pharmacy.  They will contact our office to request authorization. Prescriptions will not be filled after 5 pm or on week-ends. 3. You should follow a light diet the first 24 hours after arrival home, such as soup and crackers, etc.  Be sure to include lots of fluids daily.  Resume your normal diet the day after surgery. 4.Most patients will experience some swelling and bruising around the umbilicus or in the groin and scrotum.  Ice packs and reclining will help.  Swelling and bruising can take several days to resolve.  6. It is common to experience some constipation if taking pain medication after surgery.  Increasing fluid intake and taking a stool softener (such as Colace) will usually help or prevent this problem from occurring.  A mild laxative (Milk of Magnesia or Miralax) should be taken according to package directions if there are no bowel movements after 48 hours. 7. Unless discharge instructions indicate otherwise, you may remove your bandages 24-48 hours after surgery, and you may shower at that time.  You may have steri-strips (small skin tapes) in place directly over the incision.  These strips should be left on the skin for 7-10 days.  If your surgeon used skin glue on the incision, you may shower in 24 hours.  The glue will  flake off over the next 2-3 weeks.  Any sutures or staples will be removed at the office during your follow-up visit. 8. ACTIVITIES:  You may resume regular (light) daily activities beginning the next day--such as daily self-care, walking, climbing stairs--gradually increasing activities as tolerated.  You may have sexual intercourse when it is comfortable.  Refrain from any heavy lifting or straining until approved by your doctor.  a.You may drive when you are no longer taking prescription pain medication, you can comfortably wear a seatbelt, and you can safely maneuver your car and apply brakes. b.RETURN TO WORK:   _____________________________________________  9.You should see your doctor in the office for a follow-up appointment approximately 2-3 weeks after your surgery.  Make sure that you call for this appointment within a day or two after you arrive home to insure a convenient appointment time. 10.OTHER INSTRUCTIONS: _________________________    _____________________________________  WHEN TO CALL YOUR DOCTOR: 1. Fever over 101.0 2. Inability to urinate 3. Nausea and/or vomiting 4. Extreme swelling or bruising 5. Continued bleeding from incision. 6. Increased pain, redness, or drainage from the incision  The clinic staff is available to answer your questions during regular business hours.  Please dont hesitate to call and ask to speak to one of the nurses for clinical concerns.  If you have a medical emergency, go to the nearest emergency room or call 911.  A surgeon from [facility] is always on call at the hospital   [address], [place], [place]  [address] ?  [address], [place], [place]   [address] [phone] ? [phone] ? FAX [phone] Web site: [email or web address]

## 2018-02-13 LAB — CBC
HCT: 44.7 % (ref 39.0–52.0)
Hemoglobin: 13.4 g/dL (ref 13.0–17.0)
MCH: 26.1 pg (ref 26.0–34.0)
MCHC: 30 g/dL (ref 30.0–36.0)
MCV: 87 fL (ref 80.0–100.0)
Platelets: 293 10*3/uL (ref 150–400)
RBC: 5.14 MIL/uL (ref 4.22–5.81)
RDW: 14.6 % (ref 11.5–15.5)
WBC: 13 10*3/uL — ABNORMAL HIGH (ref 4.0–10.5)
nRBC: 0 % (ref 0.0–0.2)

## 2018-02-13 LAB — BASIC METABOLIC PANEL
Anion gap: 9 (ref 5–15)
BUN: 15 mg/dL (ref 6–20)
CO2: 32 mmol/L (ref 22–32)
Calcium: 8.2 mg/dL — ABNORMAL LOW (ref 8.9–10.3)
Chloride: 100 mmol/L (ref 98–111)
Creatinine, Ser: 1.13 mg/dL (ref 0.61–1.24)
GFR calc Af Amer: >60 mL/min (ref >60)
GFR calc non Af Amer: >60 mL/min (ref >60)
Glucose, Bld: 102 mg/dL — ABNORMAL HIGH (ref 70–99)
Potassium: 3.6 mmol/L (ref 3.5–5.1)
Sodium: 141 mmol/L (ref 135–145)

## 2018-02-13 LAB — MAGNESIUM: Magnesium: 2.3 mg/dL (ref 1.7–2.4)

## 2018-02-13 LAB — PHOSPHORUS: Phosphorus: 3.8 mg/dL (ref 2.5–4.6)

## 2018-02-13 MED ORDER — POTASSIUM CHLORIDE CRYS ER 20 MEQ PO TBCR
40.0000 meq | EXTENDED_RELEASE_TABLET | Freq: Once | ORAL | Status: AC
  Administered 2018-02-13: 40 meq via ORAL
  Filled 2018-02-13: qty 2

## 2018-02-13 NOTE — Progress Notes (Signed)
  [facility] Progress Note  2 Days Post-Op  Subjective: CC-  Feeling much better. Passing gas and having multiple BMs. Denies n/v. Pain well controlled  Objective: Vital signs in last 24 hours: Temp:  [99 F (37.2 C)-99.7 F (37.6 C)] 99 F (37.2 C) (10/23 0459) Pulse Rate:  [67-91] 91 (10/23 0459) Resp:  [16-17] 16 (10/23 0459) BP: (138-164)/(79-89) 147/79 (10/23 0459) SpO2:  [88 %-98 %] 88 % (10/23 0459) Last BM Date: 02/09/18  Intake/Output from previous day: 10/22 0701 - 10/23 0700 In: 758.2 [I.V.:758.2] Out: 1600 [Urine:500; Emesis/NG output:1100] Intake/Output this shift: No intake/output data recorded.  PE: Gen:  Alert, NAD, pleasant HEENT: EOM's intact, pupils equal and round Card:  RRR Pulm:  CTAB, no W/R/R, effort normal Abd: Soft, obses, periumbilical incision with cdi dressing in place/ other lap incisions cdi Skin: no rashes noted, warm and dry  Lab Results:  Recent Labs    02/12/18 0603 02/13/18 0622  WBC 18.5* 13.0*  HGB 12.8* 13.4  HCT 42.5 44.7  PLT 279 293   BMET Recent Labs    02/12/18 0603 02/13/18 0622  NA 143 141  K 3.9 3.6  CL 105 100  CO2 32 32  GLUCOSE 110* 102*  BUN 15 15  CREATININE 1.22 1.13  CALCIUM 8.6* 8.2*   PT/INR No results for input(s): LABPROT, INR in the last 72 hours. CMP     Component Value Date/Time   NA 141 02/13/2018 0622   K 3.6 02/13/2018 0622   CL 100 02/13/2018 0622   CO2 32 02/13/2018 0622   GLUCOSE 102 (H) 02/13/2018 0622   BUN 15 02/13/2018 0622   CREATININE 1.13 02/13/2018 0622   CREATININE 1.11 12/16/2012 1424   CALCIUM 8.2 (L) 02/13/2018 0622   PROT 8.7 (H) 02/11/2018 0014   ALBUMIN 4.0 02/11/2018 0014   AST 27 02/11/2018 0014   ALT 29 02/11/2018 0014   ALKPHOS 96 02/11/2018 0014   BILITOT 1.0 02/11/2018 0014   GFRNONAA >60 02/13/2018 0622   GFRAA >60 02/13/2018 0622   Lipase     Component Value Date/Time   LIPASE 23 02/11/2018 0014       Studies/Results: No  results found.  Anti-infectives: Anti-infectives (From admission, onward)   Start     Dose/Rate Route Frequency Ordered Stop   02/11/18 0630  ceFAZolin (ANCEF) 3 g in dextrose 5 % 50 mL IVPB     3 g 100 mL/hr over 30 Minutes Intravenous To Surgery 02/11/18 0518 02/11/18 0758   02/11/18 0515  ceFAZolin (ANCEF) powder 3 g  Status:  Discontinued     3 g Other To Surgery 02/11/18 0514 02/11/18 0517       Assessment/Plan SBO, Recurrent umbilical hernia S/p Laparoscopic repair of recurrent umbilical hernia with mesh, lysis of adhesions 10/21 Dr. Cliffton Asters - POD 2 - continue abdominal binder - having bowel function  ID - ancef periop FEN - IVF, d/c NG tube, start clear liquids VTE - SCDs, sq heparin Foley - out 10/21 Follow up - Dr. Cliffton Asters  Plan - Ambulate 5x/day; d/c ng tube. Clear liquid diet   LOS: 2 days   Stephanie Coup. Cliffton Asters, M.D. [facility]

## 2018-02-13 NOTE — Progress Notes (Signed)
Patient passing gas, had 1 bowel movement.  Doctor in room notified.  NG tube removed.

## 2018-02-14 MED ORDER — ACETAMINOPHEN 500 MG PO TABS: ORAL_TABLET | ORAL | 0 refills | Status: DC

## 2018-02-14 MED ORDER — ACETAMINOPHEN 500 MG PO TABS: 1000.0000 mg | ORAL_TABLET | Freq: Three times a day (TID) | ORAL | Status: DC

## 2018-02-14 MED ORDER — TRAMADOL HCL 50 MG PO TABS: 100.0000 mg | ORAL_TABLET | Freq: Two times a day (BID) | ORAL | 0 refills | Status: DC | PRN

## 2018-02-14 MED ORDER — TRAMADOL HCL 50 MG PO TABS: 100.0000 mg | ORAL_TABLET | Freq: Two times a day (BID) | ORAL | Status: DC | PRN

## 2018-02-14 NOTE — Progress Notes (Signed)
3 Days Post-Op    CC: Abdominal pain nausea and vomiting  Subjective: He feels good, sites OK, tolerating liquids.    Objective: Vital signs in last 24 hours: Temp:  [98.1 F (36.7 C)-99.5 F (37.5 C)] 98.1 F (36.7 C) (10/24 0530) Pulse Rate:  [81-82] 81 (10/24 0530) Resp:  [17-20] 17 (10/24 0530) BP: (141-148)/(75-94) 141/94 (10/24 0530) SpO2:  [89 %-94 %] 94 % (10/24 0530) Last BM Date: 02/09/18 840 p.o. 1728 IV Urine 125 BM x2 Afebrile T-max 99.5 vital signs are stable.  He drops his sats some while sleeping 88-89% No labs today   Intake/Output from previous day: 10/23 0701 - 10/24 0700 In: 2568.6 [P.O.:840; I.V.:1728.6] Out: 125 [Urine:125] Intake/Output this shift: No intake/output data recorded.  General appearance: alert, cooperative and no distress Resp: clear to auscultation bilaterally GI: soft, sore, sites are good.+ BM  Lab Results:  Recent Labs    02/12/18 0603 02/13/18 0622  WBC 18.5* 13.0*  HGB 12.8* 13.4  HCT 42.5 44.7  PLT 279 293    BMET Recent Labs    02/12/18 0603 02/13/18 0622  NA 143 141  K 3.9 3.6  CL 105 100  CO2 32 32  GLUCOSE 110* 102*  BUN 15 15  CREATININE 1.22 1.13  CALCIUM 8.6* 8.2*   PT/INR No results for input(s): LABPROT, INR in the last 72 hours.  Recent Labs  Lab 02/11/18 0014  AST 27  ALT 29  ALKPHOS 96  BILITOT 1.0  PROT 8.7*  ALBUMIN 4.0     Lipase     Component Value Date/Time   LIPASE 23 02/11/2018 0014     Medications: . heparin injection (subcutaneous)  5,000 Units Subcutaneous Q8H    Assessment/Plan  SBO, Recurrent umbilical hernia S/p Laparoscopic repair of recurrent umbilical hernia with mesh, lysis of adhesions10/21 Dr. Cliffton Asters - POD 3 - continue abdominal binder - no flatus/BM  ID - ancef periop FEN - IVF, NPO/bariatric clears VTE - SCDs, sq heparin Foley - out 10/21 Follow up - Dr. Cliffton Asters  Plan -  Advance diet and if OK send home later today.        LOS: 3 days     Frank Howe 02/14/[id number]

## 2018-02-14 NOTE — Discharge Summary (Signed)
Physician Discharge Summary  Patient ID: Frank Howe MRN: [id number] DOB/AGE: 1975-06-24 42 y.o.  Admit date: 02/10/2018 Discharge date: 02/14/2018  Admission Diagnoses:  SBO Recurrent umbilical hernia Borderline hypertension BMI 45.7 OSA GOUT  Discharge Diagnoses:  SBO Recurrent umbilical hernia Borderline hypertension BMI 45.7 OSA GOUT   Active Problems:   Umbilical hernia with obstruction but no gangrene   PROCEDURES: Laparoscopic repair of recurrent umbilical hernia with mesh,lysis of adhesions10/21 Dr. Skip Estimable Course:  42 year old male with a history of laparoscopic repair of a primary umbilical hernia by Dr. Maisie Fus in April of this year using an 11.5 cm VentralLight dual sided mesh underlay.  Patient had intermittent small bowel obstruction from a reducible hernia at that time.  Did well until about 1 week ago.  Since then he has been experiencing intermittent pain at his umbilicus and also intermittent more random or generalized abdominal pain.  Last night he developed frequent nausea and vomiting in association with the pain that prompted him to come into the emergency department.  He does heavy lifting at work.  He was seen in the ED and admitted by DR. Hoxworth.  He was seen in the AM by Dr. Cliffton Asters and taken to the OR for the above noted procedure.   An NG was placed at surgery and this was uncomfortable as usual.  His bowel function returned and his diet was advanced.  He took very little for pain.  He was ready for discharge after a soft diet on 10/24.  Follow up is below.   CBC Latest Ref Rng & Units 02/13/2018 02/12/2018 02/11/2018  WBC 4.0 - 10.5 K/uL 13.0(H) 18.5(H) 14.7(H)  Hemoglobin 13.0 - 17.0 g/dL 40.9 12.8(L) 14.7  Hematocrit 39.0 - 52.0 % 44.7 42.5 47.9  Platelets 150 - 400 K/uL 293 279 346   CMP Latest Ref Rng & Units 02/13/2018 02/12/2018 02/11/2018  Glucose 70 - 99 mg/dL 811(B) 147(W) 295(A)  BUN 6 - 20 mg/dL 15 15 12   Creatinine 0.61  - 1.24 mg/dL 2.13 0.86 5.78(I)  Sodium 135 - 145 mmol/L 141 143 140  Potassium 3.5 - 5.1 mmol/L 3.6 3.9 3.5  Chloride 98 - 111 mmol/L 100 105 100  CO2 22 - 32 mmol/L 32 32 27  Calcium 8.9 - 10.3 mg/dL 8.2(L) 8.6(L) 9.1  Total Protein 6.5 - 8.1 g/dL - - 8.7(H)  Total Bilirubin 0.3 - 1.2 mg/dL - - 1.0  Alkaline Phos 38 - 126 U/L - - 96  AST 15 - 41 U/L - - 27  ALT 0 - 44 U/L - - 29    CT 02/11/18:Small bowel obstruction with transition point in an umbilical Hernia.  Condition on discharge:  Improved     Disposition: Discharge disposition: 01-Home or Self Care       Allergies as of 02/14/2018   No Known Allergies     Medication List    STOP taking these medications   oxyCODONE 5 MG immediate release tablet Commonly known as:  Oxy IR/ROXICODONE     TAKE these medications   acetaminophen 500 MG tablet Commonly known as:  TYLENOL You can take 1000 mg of Tylenol every 8 hours as needed for pain.  You can buy this over-the-counter at any drugstore.  Do not exceed this amount it can harm your liver.  You can alternate this with the Ultram for pain not relieved by the plain Tylenol.   traMADol 50 MG tablet Commonly known as:  ULTRAM Take 2 tablets (100  mg total) by mouth every 12 (twelve) hours as needed for moderate pain.      Follow-up Information    Andria Meuse, MD. Go on 02/25/2018.   Specialty:  General Surgery Why:  Your appointment is 02/25/18 at 1:45pm  Please arrive 30 minutes prior to your appointment to check in and fill out paperwork. Bring photo ID and insurance information. Contact information: [address] [place] [address] [phone]           Signed: Sherrie George 02/14/2018, 12:45 PM

## 2018-05-29 ENCOUNTER — Ambulatory Visit
Admission: EM | Admit: 2018-05-29 | Discharge: 2018-05-29 | Disposition: A | Discharge status: Home / Self Care | Payer: Medicaid Other | Attending: Family Medicine | Admitting: Family Medicine

## 2018-05-29 DIAGNOSIS — R0982 Postnasal drip: Secondary | ICD-10-CM | POA: Diagnosis not present

## 2018-05-29 DIAGNOSIS — R062 Wheezing: Secondary | ICD-10-CM

## 2018-05-29 DIAGNOSIS — R05 Cough: Secondary | ICD-10-CM | POA: Diagnosis not present

## 2018-05-29 DIAGNOSIS — R5383 Other fatigue: Secondary | ICD-10-CM

## 2018-05-29 DIAGNOSIS — R509 Fever, unspecified: Secondary | ICD-10-CM

## 2018-05-29 DIAGNOSIS — R0981 Nasal congestion: Secondary | ICD-10-CM

## 2018-05-29 DIAGNOSIS — R52 Pain, unspecified: Secondary | ICD-10-CM

## 2018-05-29 DIAGNOSIS — J111 Influenza due to unidentified influenza virus with other respiratory manifestations: Secondary | ICD-10-CM

## 2018-05-29 DIAGNOSIS — R69 Illness, unspecified: Secondary | ICD-10-CM

## 2018-05-29 DIAGNOSIS — R059 Cough, unspecified: Secondary | ICD-10-CM

## 2018-05-29 MED ORDER — PREDNISONE 10 MG (21) PO TBPK: ORAL_TABLET | Freq: Every day | ORAL | 0 refills | Status: DC

## 2018-05-29 MED ORDER — OSELTAMIVIR PHOSPHATE 75 MG PO CAPS: 75.0000 mg | ORAL_CAPSULE | Freq: Two times a day (BID) | ORAL | 0 refills | Status: AC

## 2018-05-29 MED ORDER — HYDROCODONE-HOMATROPINE 5-1.5 MG/5ML PO SYRP: 5.0000 mL | ORAL_SOLUTION | Freq: Four times a day (QID) | ORAL | 0 refills | Status: DC | PRN
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
## 2018-05-29 NOTE — Discharge Instructions (Signed)

## 2018-05-29 NOTE — ED Triage Notes (Signed)
Pt c/o productive cough, head/chest congestion since monday

## 2018-06-01 ENCOUNTER — Other Ambulatory Visit: Payer: Self-pay

## 2018-06-01 ENCOUNTER — Ambulatory Visit
Admission: EM | Admit: 2018-06-01 | Discharge: 2018-06-01 | Disposition: A | Discharge status: Home / Self Care | Payer: Medicaid Other | Attending: Family Medicine | Admitting: Family Medicine

## 2018-06-01 DIAGNOSIS — I10 Essential (primary) hypertension: Secondary | ICD-10-CM

## 2018-06-01 DIAGNOSIS — E669 Obesity, unspecified: Secondary | ICD-10-CM

## 2018-06-01 DIAGNOSIS — R0683 Snoring: Secondary | ICD-10-CM

## 2018-06-01 MED ORDER — AMLODIPINE BESYLATE 5 MG PO TABS: 5.0000 mg | ORAL_TABLET | Freq: Every day | ORAL | 1 refills | Status: DC

## 2018-06-01 NOTE — Discharge Instructions (Signed)
Leave that you need to be evaluated by an ear nose and throat specialist for this problem you are having I will give you a contact for a ear nose and throat specialist and primary care Sending amlodipine to the pharmacy for you to restart to control your elevated blood pressure

## 2018-06-01 NOTE — ED Triage Notes (Signed)
Per pt he has been having hard times breathing and having more issues with his breathing when he he is doing things. Pt does wear a sleep apnea machine at night. Alert oriented x 4

## 2018-06-03 NOTE — ED Provider Notes (Signed)
[facility]    CSN: [id number] Arrival date & time: 06/01/18  1341     History   Chief Complaint Chief Complaint  Patient presents with  . Snoring    HPI Frank Howe is a 43 y.o. male.   Patient is a 43 year old male with past medical history of obesity, OSA.  He presents with evaluation for possibility of needing tonsils and adenoids removed.  He says that he is having more snoring and choking at night.  He feels like his tonsils are swelling at nighttime and cutting off his airway.  He is not currently having any trouble breathing or distress.  He uses a CPAP machine at nighttime. He was seen on 05/29/18 and treated for URI  And flu like illness. Reports improved symptoms from this. He is just concerned about tonsils. No fever, chills, myalgias. No drooling, trouble swallowing or opening the mouth.   ROS per HPI      Past Medical History:  Diagnosis Date  . Elevated blood pressure reading without diagnosis of hypertension   . Gout   . Impaired fasting blood sugar   . Obesity   . OSA (obstructive sleep apnea)     Patient Active Problem List   Diagnosis Date Noted  . Umbilical hernia with obstruction but no gangrene 02/11/2018  . Partial small bowel obstruction (HCC) 09/13/2017  . Gout, chronic 07/19/2015  . OSA (obstructive sleep apnea) 07/19/2015  . Obesity 06/29/2015  . Elevated uric acid in blood 06/29/2015  . Impaired fasting blood sugar 06/28/2015  . Knee swelling 06/28/2015  . Right knee pain 06/28/2015  . Elevated blood pressure reading without diagnosis of hypertension 06/28/2015  . Routine general medical examination at a health care facility 08/21/2011    Past Surgical History:  Procedure Laterality Date  . UMBILICAL HERNIA REPAIR N/A 09/14/2017   Procedure: LAPAROSCOPIC UMBILICAL HERNIA;  Surgeon: Romie Levee, MD;  Location: [facility];  Service: General;  Laterality: N/A;  . UMBILICAL HERNIA REPAIR N/A 02/11/2018   Procedure: LAPARASCOPIC  REDO REPAIR OF RECURRENT UMBILICAL HERNIA WITH INSERTION OF MESH, LYSIS OF ADHESIONS;  Surgeon: Andria Meuse, MD;  Location: [facility];  Service: General;  Laterality: N/A;       Home Medications    Prior to Admission medications   Medication Sig Start Date End Date Taking? Authorizing Provider  amLODipine (NORVASC) 5 MG tablet Take 1 tablet (5 mg total) by mouth daily. 06/01/18   Raylen Tangonan, Gloris Manchester A, NP  HYDROcodone-homatropine (HYCODAN) 5-1.5 MG/5ML syrup Take 5 mLs by mouth every 6 (six) hours as needed for cough. 05/29/18   Mardella Layman, MD  oseltamivir (TAMIFLU) 75 MG capsule Take 1 capsule (75 mg total) by mouth 2 (two) times daily for 5 days. 05/29/18 06/03/18  Mardella Layman, MD  predniSONE (STERAPRED UNI-PAK 21 TAB) 10 MG (21) TBPK tablet Take by mouth daily. Take as directed. 05/29/18   Mardella Layman, MD    Family History Family History  Problem Relation Age of Onset  . Arthritis Mother   . Hypertension Mother   . Stroke Mother   . Aneurysm Mother        died of brain aneurysm  . Asthma Father   . Cancer Father        ?  Marland Kitchen Arthritis Father   . Heart disease Maternal Uncle   . Diabetes Maternal Uncle   . Alcohol abuse Neg Hx   . Hyperlipidemia Neg Hx   . Hearing loss Neg Hx   .  Kidney disease Neg Hx   . Early death Neg Hx     Social History Social History   Tobacco Use  . Smoking status: Former Smoker    Packs/day: 0.25    Years: 4.00    Pack years: 1.00    Types: Cigarettes    Last attempt to quit: 12/29/2014    Years since quitting: 3.4  . Smokeless tobacco: Never Used  . Tobacco comment: once every 2 wk  Substance Use Topics  . Alcohol use: Yes    Alcohol/week: 0.0 standard drinks  . Drug use: No     Allergies   Patient has no known allergies.   Review of Systems Review of Systems   Physical Exam Triage Vital Signs ED Triage Vitals  Enc Vitals Group     BP 06/01/18 1403 (!) 194/94     Pulse Rate 06/01/18 1403 98     Resp 06/01/18 1403 20      Temp 06/01/18 1403 97.6 F (36.4 C)     Temp Source 06/01/18 1403 Oral     SpO2 06/01/18 1403 97 %     Weight 06/01/18 1405 (!) 307 lb (139.3 kg)     Height 06/01/18 1405 5\' 8"  (1.727 m)     Head Circumference --      Peak Flow --      Pain Score --      Pain Loc --      Pain Edu? --      Excl. in GC? --    No data found.  Updated Vital Signs BP (!) 194/94 (BP Location: Left Arm)   Pulse 98   Temp 97.6 F (36.4 C) (Oral)   Resp 20   Ht 5\' 8"  (1.727 m)   Wt (!) 307 lb (139.3 kg)   SpO2 97%   BMI 46.68 kg/m   Visual Acuity Right Eye Distance:   Left Eye Distance:   Bilateral Distance:    Right Eye Near:   Left Eye Near:    Bilateral Near:     Physical Exam Vitals signs and nursing note reviewed.  Constitutional:      General: He is not in acute distress.    Appearance: He is well-developed. He is obese. He is not ill-appearing, toxic-appearing or diaphoretic.  HENT:     Head: Normocephalic and atraumatic.     Right Ear: Tympanic membrane and ear canal normal.     Left Ear: Tympanic membrane and ear canal normal.     Mouth/Throat:     Pharynx: Oropharynx is clear. Uvula midline.     Tonsils: No tonsillar exudate. Swelling: 2+ on the right. 2+ on the left.     Comments: Pt with enlarged tonsils and adenoids.  Eyes:     Conjunctiva/sclera: Conjunctivae normal.  Neck:     Musculoskeletal: Neck supple.  Cardiovascular:     Rate and Rhythm: Normal rate and regular rhythm.     Heart sounds: No murmur.  Pulmonary:     Effort: Pulmonary effort is normal. No respiratory distress.     Breath sounds: Normal breath sounds.  Abdominal:     Palpations: Abdomen is soft.     Tenderness: There is no abdominal tenderness.  Skin:    General: Skin is warm and dry.  Neurological:     Mental Status: He is alert.      UC Treatments / Results  Labs (all labs ordered are listed, but only abnormal results are displayed) Labs Reviewed -  No data to  display  EKG None  Radiology No results found.  Procedures Procedures (including critical care time)  Medications Ordered in UC Medications - No data to display  Initial Impression / Assessment and Plan / UC Course  I have reviewed the triage vital signs and the nursing notes.  Pertinent labs & imaging results that were available during my care of the patient were reviewed by me and considered in my medical decision making (see chart for details).     Patient needs evaluation by an ear nose and throat specialist to recommend whether he needs tonsils or adenoids removed. He does have enlarged tonsils and adenoids upon examination He reports this is causing more snoring and choking at bedtime. Gave number for ENT specialist for him to contact  Patient very hypertensive in clinic today.  He is without any concerning signs or symptoms.  He has not been taking his blood pressure medication Refilled blood pressure medication as requested Final Clinical Impressions(s) / UC Diagnoses   Final diagnoses:  Snoring  Essential hypertension     Discharge Instructions     Leave that you need to be evaluated by an ear nose and throat specialist for this problem you are having I will give you a contact for a ear nose and throat specialist and primary care Sending amlodipine to the pharmacy for you to restart to control your elevated blood pressure     ED Prescriptions    Medication Sig Dispense Auth. Provider   amLODipine (NORVASC) 5 MG tablet Take 1 tablet (5 mg total) by mouth daily. 30 tablet Dahlia ByesBast, Kathleena Freeman A, NP     Controlled Substance Prescriptions [place] Controlled Substance Registry consulted? Not Applicable   Janace ArisBast, Joshawa Dubin A, NP 06/03/18 1530

## 2018-06-08 ENCOUNTER — Ambulatory Visit
Admission: EM | Admit: 2018-06-08 | Discharge: 2018-06-08 | Disposition: A | Discharge status: Home / Self Care | Payer: Medicaid Other | Attending: Family Medicine | Admitting: Family Medicine

## 2018-06-08 ENCOUNTER — Other Ambulatory Visit: Payer: Self-pay

## 2018-06-08 ENCOUNTER — Encounter: Payer: Self-pay | Admitting: Family Medicine

## 2018-06-08 DIAGNOSIS — M7751 Other enthesopathy of right foot: Secondary | ICD-10-CM

## 2018-06-08 MED ORDER — DICLOFENAC SODIUM 75 MG PO TBEC: 75.0000 mg | DELAYED_RELEASE_TABLET | Freq: Two times a day (BID) | ORAL | 5 refills | Status: DC

## 2018-06-08 NOTE — ED Provider Notes (Signed)
[facility]    CSN: [id number] Arrival date & time: 06/08/18  1043     History   Chief Complaint Chief Complaint  Patient presents with  . Ankle Injury    HPI Frank Howe is a 43 y.o. male.   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  He has had no injury.  He has no localized tenderness.  He has no other joint pain.  The pain is worse when he is weightbearing and usually gets worse after shift at work.     Past Medical History:  Diagnosis Date  . Elevated blood pressure reading without diagnosis of hypertension   . Gout   . Impaired fasting blood sugar   . Obesity   . OSA (obstructive sleep apnea)     Patient Active Problem List   Diagnosis Date Noted  . Umbilical hernia with obstruction but no gangrene 02/11/2018  . Partial small bowel obstruction (HCC) 09/13/2017  . Gout, chronic 07/19/2015  . OSA (obstructive sleep apnea) 07/19/2015  . Obesity 06/29/2015  . Elevated uric acid in blood 06/29/2015  . Impaired fasting blood sugar 06/28/2015  . Knee swelling 06/28/2015  . Right knee pain 06/28/2015  . Elevated blood pressure reading without diagnosis of hypertension 06/28/2015  . Routine general medical examination at a health care facility 08/21/2011    Past Surgical History:  Procedure Laterality Date  . UMBILICAL HERNIA REPAIR N/A 09/14/2017   Procedure: LAPAROSCOPIC UMBILICAL HERNIA;  Surgeon: Romie Levee, MD;  Location: [facility];  Service: General;  Laterality: N/A;  . UMBILICAL HERNIA REPAIR N/A 02/11/2018   Procedure: LAPARASCOPIC REDO REPAIR OF RECURRENT UMBILICAL HERNIA WITH INSERTION OF MESH, LYSIS OF ADHESIONS;  Surgeon: Andria Meuse, MD;  Location: [facility];  Service: General;  Laterality: N/A;       Home Medications    Prior to Admission medications   Medication Sig Start Date End Date Taking?  Authorizing Provider  losartan (COZAAR) 25 MG tablet Take 25 mg by mouth daily.   Yes [provider]  diclofenac (VOLTAREN) 75 MG EC tablet Take 1 tablet (75 mg total) by mouth 2 (two) times daily. 06/08/18   Elvina Sidle, MD    Family History Family History  Problem Relation Age of Onset  . Arthritis Mother   . Hypertension Mother   . Stroke Mother   . Aneurysm Mother        died of brain aneurysm  . Asthma Father   . Cancer Father        ?  Marland Kitchen Arthritis Father   . Heart disease Maternal Uncle   . Diabetes Maternal Uncle   . Alcohol abuse Neg Hx   . Hyperlipidemia Neg Hx   . Hearing loss Neg Hx   . Kidney disease Neg Hx   . Early death Neg Hx     Social History Social History   Tobacco Use  . Smoking status: Former Smoker    Packs/day: 0.25    Years: 4.00    Pack years: 1.00    Types: Cigarettes    Last attempt to quit: 12/29/2014    Years since quitting: 3.4  . Smokeless tobacco: Never Used  . Tobacco comment: once every 2 wk  Substance Use Topics  . Alcohol use: Yes    Alcohol/week: 0.0 standard drinks  . Drug use: No  Allergies   Patient has no known allergies.   Review of Systems Review of Systems   Physical Exam Triage Vital Signs ED Triage Vitals  Enc Vitals Group     BP      Pulse      Resp      Temp      Temp src      SpO2      Weight      Height      Head Circumference      Peak Flow      Pain Score      Pain Loc      Pain Edu?      Excl. in GC?    No data found.  Updated Vital Signs BP (!) 146/89 (BP Location: Left Arm)   Pulse 82   Temp (!) 97.2 F (36.2 C) (Oral)   Resp 18   Ht 5\' 8"  (1.727 m)   Wt (!) 139.7 kg   SpO2 94%   BMI 46.83 kg/m    Physical Exam Vitals signs and nursing note reviewed.  Constitutional:      Appearance: Normal appearance. He is obese. He is not ill-appearing.  HENT:     Mouth/Throat:     Pharynx: Oropharynx is clear.  Eyes:     Conjunctiva/sclera: Conjunctivae normal.    Pulmonary:     Effort: Pulmonary effort is normal.  Musculoskeletal: Normal range of motion.  Skin:    General: Skin is warm.  Neurological:     General: No focal deficit present.     Mental Status: He is alert.  Psychiatric:        Mood and Affect: Mood normal.      UC Treatments / Results  Labs (all labs ordered are listed, but only abnormal results are displayed) Labs Reviewed - No data to display  EKG None  Radiology No results found.  Procedures Procedures (including critical care time)  Medications Ordered in UC Medications - No data to display  Initial Impression / Assessment and Plan / UC Course  I have reviewed the triage vital signs and the nursing notes.  Pertinent labs & imaging results that were available during my care of the patient were reviewed by me and considered in my medical decision making (see chart for details).     Final Clinical Impressions(s) / UC Diagnoses   Final diagnoses:  Right ankle tendonitis   Discharge Instructions   None    ED Prescriptions    Medication Sig Dispense Auth. Provider   diclofenac (VOLTAREN) 75 MG EC tablet Take 1 tablet (75 mg total) by mouth 2 (two) times daily. 14 tablet Elvina Sidle, MD     Controlled Substance Prescriptions [place] Controlled Substance Registry consulted? Not Applicable   Elvina Sidle, MD 06/08/18 1124

## 2018-06-08 NOTE — ED Triage Notes (Addendum)
Per pt he has been having right ankle pain for a couple of months. No injury. Pt stated is he walks on it for a period of time the pain goes away. Appears to have some swelling to the ankle upon exam. No redness or discoloring

## 2018-08-01 ENCOUNTER — Ambulatory Visit: Payer: Medicaid Other | Admitting: Family Medicine

## 2018-08-06 ENCOUNTER — Ambulatory Visit
Admission: EM | Admit: 2018-08-06 | Discharge: 2018-08-06 | Disposition: A | Discharge status: Home Health | Payer: Medicaid Other

## 2018-08-30 ENCOUNTER — Ambulatory Visit
Admission: EM | Admit: 2018-08-30 | Discharge: 2018-08-30 | Disposition: A | Discharge status: Home / Self Care | Payer: Medicaid Other | Attending: Physician Assistant | Admitting: Physician Assistant

## 2018-08-30 ENCOUNTER — Encounter: Payer: Self-pay | Admitting: Emergency Medicine

## 2018-08-30 ENCOUNTER — Other Ambulatory Visit: Payer: Self-pay

## 2018-08-30 DIAGNOSIS — I10 Essential (primary) hypertension: Secondary | ICD-10-CM | POA: Diagnosis not present

## 2018-08-30 DIAGNOSIS — H02843 Edema of right eye, unspecified eyelid: Secondary | ICD-10-CM

## 2018-08-30 MED ORDER — ERYTHROMYCIN 5 MG/GM OP OINT: TOPICAL_OINTMENT | OPHTHALMIC | 0 refills | Status: DC

## 2018-08-30 NOTE — ED Notes (Signed)
Patient able to ambulate independently  

## 2018-08-30 NOTE — Discharge Instructions (Signed)
Use erythromycin ointment as directed. Lid scrubs and warm compresses as directed. Monitor for any worsening of symptoms, changes in vision, sensitivity to light, eye swelling, painful eye movement, follow up with ophthalmology for further evaluation.  ° °

## 2018-08-30 NOTE — ED Triage Notes (Signed)
PT presents to [facility] for assessment of right eye swelling since Wednesday.  States he slept with his hair down and this happens sometimes when he does so.  Pt states he put some leftover medicated drops into his eye Thursday, and today it is worse.  Denies changes in vision.

## 2018-08-30 NOTE — ED Provider Notes (Signed)
[facility]    CSN: [id number] Arrival date & time: 08/30/18  1002     History   Chief Complaint Chief Complaint  Patient presents with  . Eye Pain    HPI Frank Howe is a 43 y.o. male.   43 year old male comes in for 2-day history of right upper eyelid swelling.  States this happens occasionally when he sleeps with his head down.  He put leftover medication eyedrops in his eye yesterday, and states symptoms got worse.  He has had increased eyelid swelling.  Denies spreading erythema, warmth.  States pain only with blinking.  Has had mild eye drainage.  Denies eye redness, photophobia, blurry vision.  Has had mild URI symptoms including rhinorrhea, nasal congestion.  Denies cough, sore throat.  Denies fever, chills, night sweats.  States he is supposed to wear glasses for distance, but has not been doing so.  Denies contact lens use.     Past Medical History:  Diagnosis Date  . Elevated blood pressure reading without diagnosis of hypertension   . Gout   . Impaired fasting blood sugar   . Obesity   . OSA (obstructive sleep apnea)     Patient Active Problem List   Diagnosis Date Noted  . Umbilical hernia with obstruction but no gangrene 02/11/2018  . Partial small bowel obstruction (HCC) 09/13/2017  . Gout, chronic 07/19/2015  . OSA (obstructive sleep apnea) 07/19/2015  . Obesity 06/29/2015  . Elevated uric acid in blood 06/29/2015  . Impaired fasting blood sugar 06/28/2015  . Knee swelling 06/28/2015  . Right knee pain 06/28/2015  . Elevated blood pressure reading without diagnosis of hypertension 06/28/2015  . Routine general medical examination at a health care facility 08/21/2011    Past Surgical History:  Procedure Laterality Date  . UMBILICAL HERNIA REPAIR N/A 09/14/2017   Procedure: LAPAROSCOPIC UMBILICAL HERNIA;  Surgeon: Romie Leveehomas, Alicia, MD;  Location: [facility];  Service: General;  Laterality: N/A;  . UMBILICAL HERNIA REPAIR N/A 02/11/2018   Procedure: LAPARASCOPIC REDO REPAIR OF RECURRENT UMBILICAL HERNIA WITH INSERTION OF MESH, LYSIS OF ADHESIONS;  Surgeon: Andria MeuseWhite, Christopher M, MD;  Location: [facility];  Service: General;  Laterality: N/A;       Home Medications    Prior to Admission medications   Medication Sig Start Date End Date Taking? Authorizing Provider  diclofenac (VOLTAREN) 75 MG EC tablet Take 1 tablet (75 mg total) by mouth 2 (two) times daily. 06/08/18   Elvina SidleLauenstein, Kurt, MD  erythromycin ophthalmic ointment Place a 1/2 inch ribbon of ointment into right eye 4 times a day for 7 days 08/30/18   Belinda FisherYu, Jock Mahon V, PA-C  losartan (COZAAR) 25 MG tablet Take 25 mg by mouth daily.    [provider]    Family History Family History  Problem Relation Age of Onset  . Arthritis Mother   . Hypertension Mother   . Stroke Mother   . Aneurysm Mother        died of brain aneurysm  . Asthma Father   . Cancer Father        ?  Marland Kitchen. Arthritis Father   . Heart disease Maternal Uncle   . Diabetes Maternal Uncle   . Alcohol abuse Neg Hx   . Hyperlipidemia Neg Hx   . Hearing loss Neg Hx   . Kidney disease Neg Hx   . Early death Neg Hx     Social History Social History   Tobacco Use  . Smoking status: Former  Smoker    Packs/day: 0.25    Years: 4.00    Pack years: 1.00    Types: Cigarettes    Last attempt to quit: 12/29/2014    Years since quitting: 3.6  . Smokeless tobacco: Never Used  . Tobacco comment: once every 2 wk  Substance Use Topics  . Alcohol use: Yes    Alcohol/week: 0.0 standard drinks  . Drug use: No     Allergies   Patient has no known allergies.   Review of Systems Review of Systems  Reason unable to perform ROS: See HPI as above.     Physical Exam Triage Vital Signs ED Triage Vitals  Enc Vitals Group     BP 08/30/18 1008 (!) 143/91     Pulse Rate 08/30/18 1008 99     Resp 08/30/18 1008 (!) 22     Temp 08/30/18 1008 98.1 F (36.7 C)     Temp Source 08/30/18 1008 Oral     SpO2  08/30/18 1008 93 %     Weight --      Height --      Head Circumference --      Peak Flow --      Pain Score 08/30/18 1009 3     Pain Loc --      Pain Edu? --      Excl. in GC? --    No data found.  Updated Vital Signs BP (!) 143/91   Pulse 99   Temp 98.1 F (36.7 C) (Oral)   Resp (!) 22   SpO2 93%   Visual Acuity Right Eye Distance:   Left Eye Distance:   Bilateral Distance:    Right Eye Near: R Near: 20/70 Left Eye Near:  L Near: 20/30 Bilateral Near:  20/30  Physical Exam Constitutional:      General: He is not in acute distress.    Appearance: He is well-developed. He is not diaphoretic.  HENT:     Head: Normocephalic and atraumatic.  Eyes:     Extraocular Movements: Extraocular movements intact.     Conjunctiva/sclera: Conjunctivae normal.     Pupils: Pupils are equal, round, and reactive to light.     Comments: Diffuse right eyelid swelling with erythema along the eyelashes.  No warmth, fluctuance.  Possible stye to the nasal aspect.  No photophobia on exam.  Neurological:     Mental Status: He is alert and oriented to person, place, and time.     UC Treatments / Results  Labs (all labs ordered are listed, but only abnormal results are displayed) Labs Reviewed - No data to display  EKG None  Radiology No results found.  Procedures Procedures (including critical care time)  Medications Ordered in UC Medications - No data to display  Initial Impression / Assessment and Plan / UC Course  I have reviewed the triage vital signs and the nursing notes.  Pertinent labs & imaging results that were available during my care of the patient were reviewed by me and considered in my medical decision making (see chart for details).    Discussed possible blepharitis causing symptoms.  Lower suspicion for preseptal cellulitis given minimal erythema, no warmth, minimal pain with palpation.  Will have patient start erythromycin ointment, lid scrubs, warm  compress.  Strict return precautions given.  Patient expresses understanding and agrees to plan.  Final Clinical Impressions(s) / UC Diagnoses   Final diagnoses:  Swelling of right eyelid    ED Prescriptions  Medication Sig Dispense Auth. Provider   erythromycin ophthalmic ointment Place a 1/2 inch ribbon of ointment into right eye 4 times a day for 7 days 1 g Threasa Alpha, [place] 08/30/18 1040

## 2018-11-25 ENCOUNTER — Ambulatory Visit
Admission: EM | Admit: 2018-11-25 | Discharge: 2018-11-25 | Disposition: A | Discharge status: Home / Self Care | Payer: Medicaid Other | Attending: Physician Assistant | Admitting: Physician Assistant

## 2018-11-25 ENCOUNTER — Other Ambulatory Visit: Payer: Self-pay

## 2018-11-25 DIAGNOSIS — R1013 Epigastric pain: Secondary | ICD-10-CM

## 2018-11-25 DIAGNOSIS — H5789 Other specified disorders of eye and adnexa: Secondary | ICD-10-CM

## 2018-11-25 LAB — COMPREHENSIVE METABOLIC PANEL
ALT: 23 IU/L (ref 0–44)
AST: 19 IU/L (ref 0–40)
Albumin/Globulin Ratio: 1.2 (ref 1.2–2.2)
Albumin: 3.7 g/dL — ABNORMAL LOW (ref 4.0–5.0)
Alkaline Phosphatase: 99 IU/L (ref 39–117)
BUN/Creatinine Ratio: 9 (ref 9–20)
BUN: 10 mg/dL (ref 6–24)
Bilirubin Total: 0.7 mg/dL (ref 0.0–1.2)
CO2: 24 mmol/L (ref 20–29)
Calcium: 8.8 mg/dL (ref 8.7–10.2)
Chloride: 102 mmol/L (ref 96–106)
Creatinine, Ser: 1.11 mg/dL (ref 0.76–1.27)
GFR calc Af Amer: 94 mL/min/{1.73_m2} (ref >59)
GFR calc non Af Amer: 81 mL/min/{1.73_m2} (ref >59)
Globulin, Total: 3.2 g/dL (ref 1.5–4.5)
Glucose: 99 mg/dL (ref 65–99)
Potassium: 3.9 mmol/L (ref 3.5–5.2)
Sodium: 142 mmol/L (ref 134–144)
Total Protein: 6.9 g/dL (ref 6.0–8.5)

## 2018-11-25 LAB — LIPASE: Lipase: 17 U/L (ref 13–78)

## 2018-11-25 MED ORDER — OMEPRAZOLE 20 MG PO CPDR: 20.0000 mg | DELAYED_RELEASE_CAPSULE | Freq: Every day | ORAL | 0 refills | Status: DC

## 2018-11-25 MED ORDER — LIDOCAINE VISCOUS HCL 2 % MT SOLN
15.0000 mL | Freq: Once | OROMUCOSAL | Status: AC
  Administered 2018-11-25: 15 mL via ORAL

## 2018-11-25 MED ORDER — OFLOXACIN 0.3 % OP SOLN: 1.0000 [drp] | Freq: Four times a day (QID) | OPHTHALMIC | 0 refills | Status: AC

## 2018-11-25 MED ORDER — ALUM & MAG HYDROXIDE-SIMETH 200-200-20 MG/5ML PO SUSP
30.0000 mL | Freq: Once | ORAL | Status: AC
  Administered 2018-11-25: 30 mL via ORAL

## 2018-11-25 NOTE — ED Provider Notes (Addendum)
[facility]    CSN: [id number] Arrival date & time: 11/25/18  1334     History   Chief Complaint Chief Complaint  Patient presents with  . Abdominal Pain    HPI Frank Howe is a 43 y.o. male.   43 year old male with history of umbilical hernia causing obstruction x2 comes in for 2 to 3-day history of epigastric pain.  States pain for started while he was working, he vomited once with relief of symptoms.  Since then, he has not had any nausea or vomiting.  He had a salad without any pain before, during eating.  States more than 2 to 3 hours after eating, he noticed cramping pain to the epigastric region.  He thought it may be due to hunger, and drink a protein shake without any worsening or alleviation of symptoms.  Denies constipation, diarrhea.  Last bowel movement this morning, continues to pass gas without difficulty.  Denies URI symptoms such as cough, congestion, sore throat.  Denies fever, chills, night sweats.  Former smoker.  Occasional alcohol use, denies recent alcohol use.  Denies caffeine use, chronic NSAID use.  Denies any changes in medication.  Denies drug use.  Denies history of liver disease, pancreatitis.  Patient also complaining of few day history of right eye drainage.  Had similar few months ago and was on erythromycin.  He then had to follow-up with ophthalmology, and was provided oral antibiotics and another antibiotic eyedrop.  At this time, he denies any eye pain, eye redness, vision changes, photophobia.     Past Medical History:  Diagnosis Date  . Elevated blood pressure reading without diagnosis of hypertension   . Gout   . Impaired fasting blood sugar   . Obesity   . OSA (obstructive sleep apnea)     Patient Active Problem List   Diagnosis Date Noted  . Umbilical hernia with obstruction but no gangrene 02/11/2018  . Partial small bowel obstruction ([place]) 09/13/2017  . Gout, chronic 07/19/2015  . OSA (obstructive sleep apnea)  07/19/2015  . Obesity 06/29/2015  . Elevated uric acid in blood 06/29/2015  . Impaired fasting blood sugar 06/28/2015  . Knee swelling 06/28/2015  . Right knee pain 06/28/2015  . Elevated blood pressure reading without diagnosis of hypertension 06/28/2015  . Routine general medical examination at a health care facility 08/21/2011    Past Surgical History:  Procedure Laterality Date  . UMBILICAL HERNIA REPAIR N/A 09/14/2017   Procedure: LAPAROSCOPIC UMBILICAL HERNIA;  Surgeon: Leighton Ruff, MD;  Location: [facility];  Service: General;  Laterality: N/A;  . UMBILICAL HERNIA REPAIR N/A 02/11/2018   Procedure: LAPARASCOPIC REDO REPAIR OF RECURRENT UMBILICAL HERNIA WITH INSERTION OF MESH, LYSIS OF ADHESIONS;  Surgeon: Ileana Roup, MD;  Location: [facility];  Service: General;  Laterality: N/A;       Home Medications    Prior to Admission medications   Medication Sig Start Date End Date Taking? Authorizing Provider  ofloxacin (OCUFLOX) 0.3 % ophthalmic solution Place 1 drop into the right eye 4 (four) times daily for 7 days. 11/25/18 12/02/18  Ok Edwards, PA-C  omeprazole (PRILOSEC) 20 MG capsule Take 1 capsule (20 mg total) by mouth daily. 11/25/18   Ok Edwards, PA-C    Family History Family History  Problem Relation Age of Onset  . Arthritis Mother   . Hypertension Mother   . Stroke Mother   . Aneurysm Mother        died of brain aneurysm  .  Asthma Father   . Cancer Father        ?  Marland Kitchen. Arthritis Father   . Heart disease Maternal Uncle   . Diabetes Maternal Uncle   . Alcohol abuse Neg Hx   . Hyperlipidemia Neg Hx   . Hearing loss Neg Hx   . Kidney disease Neg Hx   . Early death Neg Hx     Social History Social History   Tobacco Use  . Smoking status: Former Smoker    Packs/day: 0.25    Years: 4.00    Pack years: 1.00    Types: Cigarettes    Quit date: 12/29/2014    Years since quitting: 3.9  . Smokeless tobacco: Never Used  . Tobacco comment: once every 2 wk   Substance Use Topics  . Alcohol use: Yes    Alcohol/week: 0.0 standard drinks  . Drug use: No     Allergies   Patient has no known allergies.   Review of Systems Review of Systems  Reason unable to perform ROS: See HPI as above.     Physical Exam Triage Vital Signs ED Triage Vitals [11/25/18 1344]  Enc Vitals Group     BP (!) 157/92     Pulse Rate 76     Resp 18     Temp 98 F (36.7 C)     Temp Source Oral     SpO2 93 %     Weight      Height      Head Circumference      Peak Flow      Pain Score 0     Pain Loc      Pain Edu?      Excl. in GC?    No data found.  Updated Vital Signs BP (!) 157/92 (BP Location: Left Arm)   Pulse 76   Temp 98 F (36.7 C) (Oral)   Resp 18   SpO2 93%   Physical Exam Constitutional:      General: He is not in acute distress.    Appearance: He is well-developed.  HENT:     Head: Normocephalic and atraumatic.  Eyes:     Comments: Mild right upper eyelid swelling without erythema, warmth, tenderness to palpation.  Chalazion to the mid right upper eyelid.  Mild discharge noted to the nasal and lateral aspect of the eye.  No conjunctival injection, photophobia.  Full EOM. PERRL  Cardiovascular:     Rate and Rhythm: Normal rate and regular rhythm.     Heart sounds: Normal heart sounds. No murmur. No friction rub. No gallop.   Pulmonary:     Effort: Pulmonary effort is normal.     Breath sounds: Normal breath sounds. No wheezing or rales.  Abdominal:     General: Bowel sounds are normal.     Palpations: Abdomen is soft.     Tenderness: There is no abdominal tenderness. There is no right CVA tenderness, left CVA tenderness, guarding or rebound.  Skin:    General: Skin is warm and dry.  Neurological:     Mental Status: He is alert and oriented to person, place, and time.  Psychiatric:        Behavior: Behavior normal.        Judgment: Judgment normal.    UC Treatments / Results  Labs (all labs ordered are listed, but only  abnormal results are displayed) Labs Reviewed  COMPREHENSIVE METABOLIC PANEL  LIPASE    EKG  Radiology No results found.  Procedures Procedures (including critical care time)  Medications Ordered in UC Medications  alum & mag hydroxide-simeth (MAALOX/MYLANTA) 200-200-20 MG/5ML suspension 30 mL (has no administration in time range)    And  lidocaine (XYLOCAINE) 2 % viscous mouth solution 15 mL (has no administration in time range)    Initial Impression / Assessment and Plan / UC Course  I have reviewed the triage vital signs and the nursing notes.  Pertinent labs & imaging results that were available during my care of the patient were reviewed by me and considered in my medical decision making (see chart for details).    No alarming signs on exam.  Abdomen soft, +BS, nontender to palpation at this time.  Lower suspicion for liver/pancreas/biliary causes, discussed further evaluation with CMP/lipase, patient would like to proceed. Will provide GI cocktail for symptoms. Omeprazole as directed. Bland diet, advance as tolerated. Return precautions given. Patient expresses understanding and agrees to plan.  Will have patient restart warm compress, lid scrubs, allergy medication.  Will provide ofloxacin eyedrop to start if symptoms not improving.  Otherwise to follow up with ophthalmology for reevaluation needed.  Patient expresses understanding and agrees to plan.  Called patient for lab results.  Normal LFTs, lipase.  Patient has eaten since discharge, and has not had return of pain.  Will continue omeprazole as planned, and continue to monitor.  Patient expresses understanding and agrees to plan.  Final Clinical Impressions(s) / UC Diagnoses   Final diagnoses:  Abdominal pain, epigastric  Eye drainage    ED Prescriptions    Medication Sig Dispense Auth. Provider   omeprazole (PRILOSEC) 20 MG capsule Take 1 capsule (20 mg total) by mouth daily. 14 capsule Yu, Amy V, PA-C    ofloxacin (OCUFLOX) 0.3 % ophthalmic solution Place 1 drop into the right eye 4 (four) times daily for 7 days. 1.4 mL Threasa AlphaYu, Amy V, PA-C        Yu, Amy V, PA-C 11/25/18 1420    4 East Bear Hill CircleYu, Amy V, PA-C 11/25/18 1703

## 2018-11-25 NOTE — Discharge Instructions (Signed)
GI cocktail given in office today.  Start omeprazole as directed.  Labs drawn to check liver function and pancreas function.  Bland diet, advance as tolerated.  Stay hydrated, urine should be clear to pale yellow in color.  If experiencing worsening abdominal pain, vomiting, no longer moving bowels/passing gas, go to the emergency department for further evaluation.  No obvious signs of eye infection at this time.  Restart lid scrubs and warm compress.  Started on allergy medication.  I have called in an antibiotic eyedrop, if symptoms not improving can start to help with drainage.  May need to follow-up back with Dr. Katy Fitch if symptoms not improving.

## 2018-11-25 NOTE — ED Triage Notes (Signed)
Pt c/o upper abdominal pain since Saturday, vomit x1 and it released pressure. States hasn't ate since Sunday d/t pain.

## 2019-01-09 ENCOUNTER — Ambulatory Visit (INDEPENDENT_AMBULATORY_CARE_PROVIDER_SITE_OTHER): Payer: Medicaid Other | Admitting: Family Medicine

## 2019-01-09 DIAGNOSIS — G4733 Obstructive sleep apnea (adult) (pediatric): Secondary | ICD-10-CM

## 2019-01-09 DIAGNOSIS — M25572 Pain in left ankle and joints of left foot: Secondary | ICD-10-CM | POA: Diagnosis not present

## 2019-01-09 DIAGNOSIS — K429 Umbilical hernia without obstruction or gangrene: Secondary | ICD-10-CM | POA: Diagnosis not present

## 2019-01-09 MED ORDER — NAPROXEN 500 MG PO TABS: 500.0000 mg | ORAL_TABLET | Freq: Two times a day (BID) | ORAL | 0 refills | Status: DC

## 2019-01-09 MED ORDER — PREDNISONE 20 MG PO TABS: 20.0000 mg | ORAL_TABLET | Freq: Every day | ORAL | 0 refills | Status: DC

## 2019-01-09 NOTE — Progress Notes (Signed)
Having c/o B foot & ankle swelling.  Also having c/o sharp stabbing abdominal pain. States that he had bowel surgery last year. Also having bloating, gassiness

## 2019-01-09 NOTE — Progress Notes (Signed)
Virtual Visit via Telephone Note  I connected with Frank Howe, on 01/09/2019 at 11:10 AM by telephone due to the COVID-19 pandemic and verified that I am speaking with the correct person using two identifiers.   Consent: I discussed the limitations, risks, security and privacy concerns of performing an evaluation and management service by telephone and the availability of in person appointments. I also discussed with the patient that there may be a patient responsible charge related to this service. The patient expressed understanding and agreed to proceed.   [place]  Location of Provider: Clinic   Persons participating in Telemedicine visit: Guadlupe SpanishRichard Mancha Kierra Barnet [facility]-CMA Dr. Nelwyn SalisburyNewlin-PCP     History of Present Illness: Frank Howe is a 43 year old male who presents today to establish care.  He is s/p umbilical hernia repair in 01/2018 and he states his hernia has "popped up again". States his stomach makes growling sounds and sometimes has abdominal pain 4/10 but nausea or vomiting. He does not think he gained weight following his surgery. He complains of ankle swelling and associated pain lasting 2-3 days then it resolves and it not associated with being in the dependent position, once every 2-3 month. Of note he had an elevated uric acid level in 11/2012 but is not on medication for gout and he informs me he does not think this is gout as this feels different. He has a CPAP machine that needs re caliberation and is wondering if he can have a repeat sleep study.  Past Medical History:  Diagnosis Date  . Elevated blood pressure reading without diagnosis of hypertension   . Gout   . Impaired fasting blood sugar   . Obesity   . OSA (obstructive sleep apnea)    No Known Allergies  No current outpatient medications on file prior to visit.   No current facility-administered medications on file prior to visit.      Observations/Objective: Awake, alert, oriented x3 Not in acute distress  Assessment and Plan: 1. OSA (obstructive sleep apnea) Weight loss will be beneficial - Split night study; Future  2. Pain of joint of left ankle and foot We will see for an in office visit and send of uric acid level Could be gout versus osteoarthritis - naproxen (NAPROSYN) 500 MG tablet; Take 1 tablet (500 mg total) by mouth 2 (two) times daily with a meal.  Dispense: 60 tablet; Refill: 0 - predniSONE (DELTASONE) 20 MG tablet; Take 1 tablet (20 mg total) by mouth daily with breakfast. Do not take with Naproxen  Dispense: 5 tablet; Refill: 0  3. Umbilical hernia without obstruction and without gangrene Overweight could be contributing to this He has no obstructive symptoms We will assess in office   Follow Up Instructions: Return in about 3 weeks (around 01/30/2019) for follow up on ankle pain.    I discussed the assessment and treatment plan with the patient. The patient was provided an opportunity to ask questions and all were answered. The patient agreed with the plan and demonstrated an understanding of the instructions.   The patient was advised to call back or seek an in-person evaluation if the symptoms worsen or if the condition fails to improve as anticipated.     I provided 16 minutes total of non-face-to-face time during this encounter including median intraservice time, reviewing previous notes, labs, imaging, medications, management and patient verbalized understanding.     Hoy RegisterEnobong Buryl Bamber, MD, FAAFP. [facility] [place],  [place] [phone]   01/09/2019, 11:10 AM

## 2019-02-06 ENCOUNTER — Ambulatory Visit: Payer: Medicaid Other

## 2019-02-12 ENCOUNTER — Telehealth: Payer: Self-pay

## 2019-02-12 NOTE — Telephone Encounter (Signed)
Called patient to do their pre-visit COVID screening.  Call went to voicemail. Unable to do prescreening.  

## 2019-02-13 ENCOUNTER — Ambulatory Visit (INDEPENDENT_AMBULATORY_CARE_PROVIDER_SITE_OTHER): Payer: Medicaid Other | Admitting: Family Medicine

## 2019-02-13 ENCOUNTER — Other Ambulatory Visit: Payer: Self-pay

## 2019-02-13 VITALS — BP 157/95 | HR 72 | Temp 97.3°F | Resp 17 | Ht 67.0 in | Wt 305.4 lb

## 2019-02-13 DIAGNOSIS — K429 Umbilical hernia without obstruction or gangrene: Secondary | ICD-10-CM | POA: Diagnosis not present

## 2019-02-13 DIAGNOSIS — R600 Localized edema: Secondary | ICD-10-CM | POA: Diagnosis not present

## 2019-02-13 DIAGNOSIS — I10 Essential (primary) hypertension: Secondary | ICD-10-CM | POA: Diagnosis not present

## 2019-02-13 DIAGNOSIS — Z6841 Body Mass Index (BMI) 40.0 and over, adult: Secondary | ICD-10-CM

## 2019-02-13 MED ORDER — LISINOPRIL-HYDROCHLOROTHIAZIDE 10-12.5 MG PO TABS: 1.0000 | ORAL_TABLET | Freq: Every day | ORAL | 3 refills | Status: DC

## 2019-02-13 NOTE — Progress Notes (Signed)
Subjective:  Patient ID: Frank Howe, male    DOB: 26-Sep-1975  Age: 43 y.o. MRN: [id number]  CC: Ankle Pain   HPI Frank Howe is a 43 year old male with morbid obesity seen here for a follow up visit. Three weeks ago he was treated for left ankle pain which has resolved at this time. 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 Also thinks sometimes he has swelling in his hands. He denies presence of dyspnea, weight gain.  He also complains of a lot of "digestive noises in his stomach" but denies abdominal pain. He does have a hernia below his umbilicus. Denies nausea, vomiting, diarrhea, constipation and is able to pass flatus.  His BP is elevated and was also elevated at his ED visit in 11/2018 and he is not on an antihypertensive. He denies chest pain, palpitations  Past Medical History:  Diagnosis Date  . Elevated blood pressure reading without diagnosis of hypertension   . Gout   . Impaired fasting blood sugar   . Obesity   . OSA (obstructive sleep apnea)     Past Surgical History:  Procedure Laterality Date  . UMBILICAL HERNIA REPAIR N/A 09/14/2017   Procedure: LAPAROSCOPIC UMBILICAL HERNIA;  Surgeon: Leighton Ruff, MD;  Location: [facility];  Service: General;  Laterality: N/A;  . UMBILICAL HERNIA REPAIR N/A 02/11/2018   Procedure: LAPARASCOPIC REDO REPAIR OF RECURRENT UMBILICAL HERNIA WITH INSERTION OF MESH, LYSIS OF ADHESIONS;  Surgeon: Ileana Roup, MD;  Location: [facility];  Service: General;  Laterality: N/A;    Family History  Problem Relation Age of Onset  . Arthritis Mother   . Hypertension Mother   . Stroke Mother   . Aneurysm Mother        died of brain aneurysm  . Asthma Father   . Cancer Father        ?  Marland Kitchen Arthritis Father   . Heart disease Maternal Uncle   . Diabetes Maternal Uncle   . Alcohol abuse Neg Hx   . Hyperlipidemia Neg Hx   . Hearing loss Neg Hx   . Kidney disease  Neg Hx   . Early death Neg Hx     No Known Allergies  Outpatient Medications Prior to Visit  Medication Sig Dispense Refill  . naproxen (NAPROSYN) 500 MG tablet Take 1 tablet (500 mg total) by mouth 2 (two) times daily with a meal. 60 tablet 0   No facility-administered medications prior to visit.      ROS Review of Systems  Constitutional: Negative for activity change and appetite change.  HENT: Negative for sinus pressure and sore throat.   Eyes: Negative for visual disturbance.  Respiratory: Negative for cough, chest tightness and shortness of breath.   Cardiovascular: Positive for leg swelling. Negative for chest pain.  Gastrointestinal: Negative for abdominal distention, abdominal pain, constipation and diarrhea.  Endocrine: Negative.   Genitourinary: Negative for dysuria.  Musculoskeletal: Negative for joint swelling and myalgias.  Skin: Negative for rash.  Allergic/Immunologic: Negative.   Neurological: Negative for weakness, light-headedness and numbness.  Psychiatric/Behavioral: Negative for dysphoric mood and suicidal ideas.    Objective:  BP (!) 157/95   Pulse 72   Temp (!) 97.3 F (36.3 C) (Temporal)   Resp 17   Ht '5\' 7"'  (1.702 m)   Wt (!) 305 lb 6.4 oz (138.5 kg)   SpO2 94%  BMI 47.83 kg/m   BP/Weight 02/13/2019 06/24/3555 06/24/2023  Systolic BP 427 062 376  Diastolic BP 95 92 91  Wt. (Lbs) 305.4 - -  BMI 47.83 - -      Physical Exam Constitutional:      Appearance: He is well-developed. He is obese.  Neck:     Vascular: No JVD.  Cardiovascular:     Rate and Rhythm: Normal rate.     Heart sounds: Normal heart sounds. No murmur.  Pulmonary:     Effort: Pulmonary effort is normal.     Breath sounds: Normal breath sounds. No wheezing or rales.  Chest:     Chest wall: No tenderness.  Abdominal:     General: Bowel sounds are normal. There is no distension.     Palpations: Abdomen is soft. There is no mass.     Tenderness: There is no abdominal  tenderness.     Hernia: A hernia (inferior to umbilicus on the left) is present.  Musculoskeletal: Normal range of motion.     Right lower leg: Edema (1+ non pitting) present.     Left lower leg: Edema (1+ non pitting) present.  Neurological:     Mental Status: He is alert and oriented to person, place, and time.  Psychiatric:        Mood and Affect: Mood normal.     CMP Latest Ref Rng & Units 11/25/2018 02/13/2018 02/12/2018  Glucose 65 - 99 mg/dL 99 102(H) 110(H)  BUN 6 - 24 mg/dL '10 15 15  ' Creatinine 0.76 - 1.27 mg/dL 1.11 1.13 1.22  Sodium 134 - 144 mmol/L 142 141 143  Potassium 3.5 - 5.2 mmol/L 3.9 3.6 3.9  Chloride 96 - 106 mmol/L 102 100 105  CO2 20 - 29 mmol/L 24 32 32  Calcium 8.7 - 10.2 mg/dL 8.8 8.2(L) 8.6(L)  Total Protein 6.0 - 8.5 g/dL 6.9 - -  Total Bilirubin 0.0 - 1.2 mg/dL 0.7 - -  Alkaline Phos 39 - 117 IU/L 99 - -  AST 0 - 40 IU/L 19 - -  ALT 0 - 44 IU/L 23 - -    Lipid Panel     Component Value Date/Time   CHOL 187 01/27/2013 1117   TRIG 86 01/27/2013 1117   HDL 44 01/27/2013 1117   CHOLHDL 4.3 01/27/2013 1117   VLDL 17 01/27/2013 1117   LDLCALC 126 (H) 01/27/2013 1117    CBC    Component Value Date/Time   WBC 13.0 (H) 02/13/2018 0622   RBC 5.14 02/13/2018 0622   HGB 13.4 02/13/2018 0622   HCT 44.7 02/13/2018 0622   PLT 293 02/13/2018 0622   MCV 87.0 02/13/2018 0622   MCH 26.1 02/13/2018 0622   MCHC 30.0 02/13/2018 0622   RDW 14.6 02/13/2018 0622   LYMPHSABS 4.1 (H) 01/27/2013 1117   MONOABS 0.5 01/27/2013 1117   EOSABS 0.2 01/27/2013 1117   BASOSABS 0.0 01/27/2013 1117    Lab Results  Component Value Date   HGBA1C 6.3 (H) 01/27/2013    Assessment & Plan:   1. Essential hypertension New diagnosis Commence Lisinopril/HCTZ as HCTZ will help with pedal edema Counseled on blood pressure goal of less than 130/80, low-sodium, DASH diet, medication compliance, 150 minutes of moderate intensity exercise per week. Discussed medication  compliance, adverse effects. - CMP14+EGFR - Lipid panel - lisinopril-hydrochlorothiazide (ZESTORETIC) 10-12.5 MG tablet; Take 1 tablet by mouth daily.  Dispense: 30 tablet; Refill: 3  2. Morbid obesity ([place]) Counseled on 150  minutes of exercise per week, healthy eating (including decreased daily intake of saturated fats, cholesterol, added sugars, sodium) Reduce portion sizes, avoid late meals  3. Umbilical hernia without obstruction and without gangrene No obstructive symptoms Observation for now. Discussed signs of strangulation or obstruction and he knows to present to the ED if this occurs  4. Pedal edema Dependent Commenced diuretic Elevate feet, use compression stockings    Meds ordered this encounter  Medications  . lisinopril-hydrochlorothiazide (ZESTORETIC) 10-12.5 MG tablet    Sig: Take 1 tablet by mouth daily.    Dispense:  30 tablet    Refill:  3    Follow-up: Return in about 3 months (around 05/16/2019) for Hypertension.       Charlott Rakes, MD, FAAFP. [facility] [place], [place]   02/13/2019, 11:19 AM

## 2019-02-13 NOTE — Progress Notes (Signed)
Patient following up on L ankle pain. States that the pain has resolved.  Patient is fasting.  Patient declines flu shot.

## 2019-02-13 NOTE — Patient Instructions (Signed)
Edema  Edema is when you have too much fluid in your body or under your skin. Edema may make your legs, feet, and ankles swell up. Swelling is also common in looser tissues, like around your eyes. This is a common condition. It gets more common as you get older. There are many possible causes of edema. Eating too much salt (sodium) and being on your feet or sitting for a long time can cause edema in your legs, feet, and ankles. Hot weather may make edema worse. Edema is usually painless. Your skin may look swollen or shiny. Follow these instructions at home:  Keep the swollen body part raised (elevated) above the level of your heart when you are sitting or lying down.  Do not sit still or stand for a long time.  Do not wear tight clothes. Do not wear garters on your upper legs.  Exercise your legs. This can help the swelling go down.  Wear elastic bandages or support stockings as told by your doctor.  Eat a low-salt (low-sodium) diet to reduce fluid as told by your doctor.  Depending on the cause of your swelling, you may need to limit how much fluid you drink (fluid restriction).  Take over-the-counter and prescription medicines only as told by your doctor. Contact a doctor if:  Treatment is not working.  You have heart, liver, or kidney disease and have symptoms of edema.  You have sudden and unexplained weight gain. Get help right away if:  You have shortness of breath or chest pain.  You cannot breathe when you lie down.  You have pain, redness, or warmth in the swollen areas.  You have heart, liver, or kidney disease and get edema all of a sudden.  You have a fever and your symptoms get worse all of a sudden. Summary  Edema is when you have too much fluid in your body or under your skin.  Edema may make your legs, feet, and ankles swell up. Swelling is also common in looser tissues, like around your eyes.  Raise (elevate) the swollen body part above the level of your  heart when you are sitting or lying down.  Follow your doctor's instructions about diet and how much fluid you can drink (fluid restriction). This information is not intended to replace advice given to you by your health care provider. Make sure you discuss any questions you have with your health care provider. Document Released: 09/27/2007 Document Revised: 04/13/2017 Document Reviewed: 04/28/2016 Elsevier Patient Education  2020 Elsevier Inc.  

## 2019-02-14 LAB — CMP14+EGFR
ALT: 21 IU/L (ref 0–44)
AST: 16 IU/L (ref 0–40)
Albumin/Globulin Ratio: 1.2 (ref 1.2–2.2)
Albumin: 3.8 g/dL — ABNORMAL LOW (ref 4.0–5.0)
Alkaline Phosphatase: 115 IU/L (ref 39–117)
BUN/Creatinine Ratio: 10 (ref 9–20)
BUN: 10 mg/dL (ref 6–24)
Bilirubin Total: 0.5 mg/dL (ref 0.0–1.2)
CO2: 23 mmol/L (ref 20–29)
Calcium: 8.7 mg/dL (ref 8.7–10.2)
Chloride: 104 mmol/L (ref 96–106)
Creatinine, Ser: 1.05 mg/dL (ref 0.76–1.27)
GFR calc Af Amer: 101 mL/min/{1.73_m2} (ref >59)
GFR calc non Af Amer: 87 mL/min/{1.73_m2} (ref >59)
Globulin, Total: 3.1 g/dL (ref 1.5–4.5)
Glucose: 98 mg/dL (ref 65–99)
Potassium: 4.2 mmol/L (ref 3.5–5.2)
Sodium: 140 mmol/L (ref 134–144)
Total Protein: 6.9 g/dL (ref 6.0–8.5)

## 2019-02-14 LAB — LIPID PANEL
Chol/HDL Ratio: 5.1 ratio — ABNORMAL HIGH (ref 0.0–5.0)
Cholesterol, Total: 158 mg/dL (ref 100–199)
HDL: 31 mg/dL — ABNORMAL LOW (ref >39)
LDL Chol Calc (NIH): 111 mg/dL — ABNORMAL HIGH (ref 0–99)
Triglycerides: 84 mg/dL (ref 0–149)
VLDL Cholesterol Cal: 16 mg/dL (ref 5–40)

## 2019-02-20 NOTE — Progress Notes (Signed)
Patient notified of results & recommendations. Expressed understanding.

## 2019-03-06 ENCOUNTER — Other Ambulatory Visit (HOSPITAL_COMMUNITY)
Admission: RE | Admit: 2019-03-06 | Discharge: 2019-03-06 | Disposition: A | Discharge status: Home / Self Care | Payer: Medicaid Other | Source: Ambulatory Visit | Attending: Internal Medicine | Admitting: Internal Medicine

## 2019-03-06 DIAGNOSIS — Z20828 Contact with and (suspected) exposure to other viral communicable diseases: Secondary | ICD-10-CM | POA: Insufficient documentation

## 2019-03-06 DIAGNOSIS — Z01812 Encounter for preprocedural laboratory examination: Secondary | ICD-10-CM | POA: Diagnosis not present

## 2019-03-07 LAB — NOVEL CORONAVIRUS, NAA (HOSP ORDER, SEND-OUT TO REF LAB; TAT 18-24 HRS): SARS-CoV-2, NAA: NOT DETECTED

## 2019-03-09 ENCOUNTER — Ambulatory Visit (HOSPITAL_BASED_OUTPATIENT_CLINIC_OR_DEPARTMENT_OTHER): Payer: Medicaid Other | Attending: Family Medicine | Admitting: Internal Medicine

## 2019-03-09 ENCOUNTER — Other Ambulatory Visit: Payer: Self-pay

## 2019-03-09 DIAGNOSIS — G4733 Obstructive sleep apnea (adult) (pediatric): Secondary | ICD-10-CM | POA: Diagnosis present

## 2019-03-16 DIAGNOSIS — G4733 Obstructive sleep apnea (adult) (pediatric): Secondary | ICD-10-CM | POA: Diagnosis not present

## 2019-03-16 NOTE — Procedures (Signed)
Patient Name: Frank, Howe Date: 03/09/2019 Gender: Male D.O.B: [birth year] Age (years): 20 Referring Provider: Arnoldo Morale Height (inches): 68 Interpreting Physician: Baird Lyons MD, ABSM Weight (lbs): 305 RPSGT: Gwenyth Allegra BMI: 46 MRN: [id number] Neck Size: 19.00  CLINICAL INFORMATION Sleep Study Type: Split Night CPAP Indication for sleep study: OSA Epworth Sleepiness Score: 16  SLEEP STUDY TECHNIQUE As per the AASM Manual for the Scoring of Sleep and Associated Events v2.3 (April 2016) with a hypopnea requiring 4% desaturations.  The channels recorded and monitored were frontal, central and occipital EEG, electrooculogram (EOG), submentalis EMG (chin), nasal and oral airflow, thoracic and abdominal wall motion, anterior tibialis EMG, snore microphone, electrocardiogram, and pulse oximetry. Continuous positive airway pressure (CPAP) was initiated when the patient met split night criteria and was titrated according to treat sleep-disordered breathing.  MEDICATIONS Medications self-administered by patient taken the night of the study : N/A  RESPIRATORY PARAMETERS Diagnostic  Total AHI (/hr): 119.5 RDI (/hr): 126.9 OA Index (/hr): 51.9 CA Index (/hr): 0.0 REM AHI (/hr): N/A NREM AHI (/hr): 119.5 Supine AHI (/hr): 143.4 Non-supine AHI (/hr): 106.2 Min O2 Sat (%): 80.0 Mean O2 (%): 92.4 Time below 88% (min): 15.8   Titration  Optimal Pressure (cm): 14 AHI at Optimal Pressure (/hr): 0.0 Min O2 at Optimal Pressure (%): 93.0 Supine % at Optimal (%): 70 Sleep % at Optimal (%): 91   SLEEP ARCHITECTURE The recording time for the entire night was 362 minutes.  During a baseline period of 152.5 minutes, the patient slept for 121.5 minutes in REM and nonREM, yielding a sleep efficiency of 79.7%%. Sleep onset after lights out was 11.8 minutes with a REM latency of N/A minutes. The patient spent 25.9%% of the night in stage N1 sleep, 74.1%% in stage N2 sleep, 0.0%% in  stage N3 and 0% in REM.  During the titration period of 207.5 minutes, the patient slept for 181.5 minutes in REM and nonREM, yielding a sleep efficiency of 87.5%%. Sleep onset after CPAP initiation was 13.7 minutes with a REM latency of 26.5 minutes. The patient spent 2.5%% of the night in stage N1 sleep, 63.1%% in stage N2 sleep, 0.0%% in stage N3 and 34.4% in REM.  CARDIAC DATA The 2 lead EKG demonstrated sinus rhythm. The mean heart rate was 100.0 beats per minute. Other EKG findings include: None.  LEG MOVEMENT DATA The total Periodic Limb Movements of Sleep (PLMS) were 0. The PLMS index was 0.0 .  IMPRESSIONS - Severe obstructive sleep apnea occurred during the diagnostic portion of the study (AHI = 119.5/hour). An optimal PAP pressure was selected for this patient ( 14 cm of water) - No significant central sleep apnea occurred during the diagnostic portion of the study (CAI = 0.0/hour). - The patient had minimal or no oxygen desaturation during the diagnostic portion of the study (Min O2 = 80.0%). Min sat at CPAP 14 was 93%. - The patient snored with moderate snoring volume during the diagnostic portion of the study. - No cardiac abnormalities were noted during this study. - Clinically significant periodic limb movements did not occur during sleep.  DIAGNOSIS - Obstructive Sleep Apnea (327.23 [G47.33 ICD-10])  RECOMMENDATIONS - Trial of CPAP therapy on 14 cm H2O or autopap 10-20. - Patient used a Large size Fisher&Paykel Full Face Mask Simplus mask and heated humidification. - Be careful with alcohol, sedatives and other CNS depressants that may worsen sleep apnea and disrupt normal sleep architecture. - Sleep hygiene should be reviewed  to assess factors that may improve sleep quality. - Weight management and regular exercise should be initiated or continued.  [Electronically signed] 03/16/2019 10:33 AM  Baird Lyons MD, ABSM Diplomate, American Board of Sleep Medicine    NPI: [id number]                         [place] [place] [facility]  ELECTRONICALLY SIGNED ON:  03/16/2019, 10:31 AM [place] PH: [phone]   FX: [phone] [place]

## 2019-03-17 ENCOUNTER — Other Ambulatory Visit: Payer: Self-pay | Admitting: Family Medicine

## 2019-03-17 DIAGNOSIS — M25572 Pain in left ankle and joints of left foot: Secondary | ICD-10-CM

## 2019-03-19 ENCOUNTER — Telehealth: Payer: Self-pay

## 2019-03-19 ENCOUNTER — Other Ambulatory Visit: Payer: Self-pay | Admitting: Family Medicine

## 2019-03-19 MED ORDER — MISC. DEVICES MISC: 0 refills | Status: DC

## 2019-03-19 NOTE — Telephone Encounter (Signed)
Attempted to contact the patient, # [phone] to explain sleep study results and need for CPAP as well as inquire if he has a preference for DME companies.  Message left with call back requested to this CM

## 2019-03-23 ENCOUNTER — Other Ambulatory Visit: Payer: Self-pay

## 2019-03-23 ENCOUNTER — Ambulatory Visit
Admission: EM | Admit: 2019-03-23 | Discharge: 2019-03-23 | Disposition: A | Discharge status: Home / Self Care | Payer: Medicaid Other

## 2019-03-23 DIAGNOSIS — R197 Diarrhea, unspecified: Secondary | ICD-10-CM

## 2019-03-23 DIAGNOSIS — R112 Nausea with vomiting, unspecified: Secondary | ICD-10-CM | POA: Diagnosis not present

## 2019-03-23 MED ORDER — DICYCLOMINE HCL 20 MG PO TABS: 20.0000 mg | ORAL_TABLET | Freq: Two times a day (BID) | ORAL | 0 refills | Status: DC

## 2019-03-23 MED ORDER — ONDANSETRON 4 MG PO TBDP: 4.0000 mg | ORAL_TABLET | Freq: Three times a day (TID) | ORAL | 0 refills | Status: DC | PRN

## 2019-03-23 MED ORDER — ONDANSETRON 4 MG PO TBDP
4.0000 mg | ORAL_TABLET | Freq: Once | ORAL | Status: AC
  Administered 2019-03-23: 10:00:00 4 mg via ORAL

## 2019-03-23 NOTE — ED Provider Notes (Signed)
[facility]    CSN: [id number] Arrival date & time: 03/23/19  0906      History   Chief Complaint Chief Complaint  Patient presents with  . Nausea  . Diarrhea    HPI Frank Howe is a 43 y.o. male.   43 year old male comes in for 3 day history of nausea, vomiting, diarrhea. States this first started out as dry heaving with 5+ episodes of diarrhea. Now with continuous diarrhea at least 5+ per day. Has had nausea/vomiting for the past 2 days with average of at least 5 episodes of vomiting per day. Vomit has continued to be NBNB. Has not been able to tolerate oral intake. Denies melena, hematochezia. States when symptoms first started, had some abdominal pressure that has since resolved. Denies abdominal pain. Denies fever, chills, body aches. Denies shortness of breath, loss of taste/smell. Denies URI symptoms. Work requires mask to be worn, and is 45ft apart from coworkers. No known sick/COVID contact.   Patient states when symptoms first started, he had taken his BP medicine at a different time of the day, and wonders if that could be the cause of symptoms. He was first prescribed lisinopril-HCTZ 02/13/2019 for BP and pedal edema. However, he did not start medicine until 03/16/2019. States he was taking medicine right after eating without problems. However, day of symptom onset, he took it a few hours after eating. He has still been taking BP medication despite not having any food intake.      Past Medical History:  Diagnosis Date  . Elevated blood pressure reading without diagnosis of hypertension   . Gout   . Impaired fasting blood sugar   . Obesity   . OSA (obstructive sleep apnea)     Patient Active Problem List   Diagnosis Date Noted  . Umbilical hernia with obstruction but no gangrene 02/11/2018  . Partial small bowel obstruction (HCC) 09/13/2017  . Gout, chronic 07/19/2015  . OSA (obstructive sleep apnea) 07/19/2015  . Obesity 06/29/2015  . Elevated  uric acid in blood 06/29/2015  . Impaired fasting blood sugar 06/28/2015  . Knee swelling 06/28/2015  . Right knee pain 06/28/2015  . Elevated blood pressure reading without diagnosis of hypertension 06/28/2015  . Routine general medical examination at a health care facility 08/21/2011    Past Surgical History:  Procedure Laterality Date  . UMBILICAL HERNIA REPAIR N/A 09/14/2017   Procedure: LAPAROSCOPIC UMBILICAL HERNIA;  Surgeon: Romie Levee, MD;  Location: [facility];  Service: General;  Laterality: N/A;  . UMBILICAL HERNIA REPAIR N/A 02/11/2018   Procedure: LAPARASCOPIC REDO REPAIR OF RECURRENT UMBILICAL HERNIA WITH INSERTION OF MESH, LYSIS OF ADHESIONS;  Surgeon: Andria Meuse, MD;  Location: [facility];  Service: General;  Laterality: N/A;       Home Medications    Prior to Admission medications   Medication Sig Start Date End Date Taking? Authorizing Provider  PRESCRIPTION MEDICATION Gout medication, unknown name   Yes [provider]  dicyclomine (BENTYL) 20 MG tablet Take 1 tablet (20 mg total) by mouth 2 (two) times daily. 03/23/19   Cathie Hoops, Keyshaun Exley V, PA-C  lisinopril-hydrochlorothiazide (ZESTORETIC) 10-12.5 MG tablet Take 1 tablet by mouth daily. 02/13/19   Hoy Register, MD  Misc. Devices MISC CPAP therapy on 14 cm H2O or autopap 10-20. - Patient used a Large size Fisher&Paykel Full Face Mask Simplus mask and heated humidification. 03/19/19   Hoy Register, MD  ondansetron (ZOFRAN ODT) 4 MG disintegrating tablet Take 1 tablet (  4 mg total) by mouth every 8 (eight) hours as needed for nausea or vomiting. 03/23/19   Ok Edwards, PA-C    Family History Family History  Problem Relation Age of Onset  . Arthritis Mother   . Hypertension Mother   . Stroke Mother   . Aneurysm Mother        died of brain aneurysm  . Asthma Father   . Cancer Father        ?  Marland Kitchen Arthritis Father   . Heart disease Maternal Uncle   . Diabetes Maternal Uncle   . Alcohol abuse Neg Hx   .  Hyperlipidemia Neg Hx   . Hearing loss Neg Hx   . Kidney disease Neg Hx   . Early death Neg Hx     Social History Social History   Tobacco Use  . Smoking status: Former Smoker    Packs/day: 0.25    Years: 4.00    Pack years: 1.00    Types: Cigarettes    Quit date: 12/29/2014    Years since quitting: 4.2  . Smokeless tobacco: Never Used  . Tobacco comment: once every 2 wk  Substance Use Topics  . Alcohol use: Yes    Alcohol/week: 0.0 standard drinks  . Drug use: No     Allergies   Patient has no known allergies.   Review of Systems Review of Systems  Reason unable to perform ROS: See HPI as above.     Physical Exam Triage Vital Signs ED Triage Vitals  Enc Vitals Group     BP 03/23/19 0925 (!) 150/91     Pulse Rate 03/23/19 0925 98     Resp 03/23/19 0925 18     Temp 03/23/19 0925 98.1 F (36.7 C)     Temp src --      SpO2 03/23/19 0925 92 %     Weight --      Height --      Head Circumference --      Peak Flow --      Pain Score 03/23/19 0924 0     Pain Loc --      Pain Edu? --      Excl. in [place]? --    No data found.  Updated Vital Signs BP (!) 150/91   Pulse 98   Temp 98.1 F (36.7 C)   Resp 18   SpO2 92%   Physical Exam Constitutional:      General: He is not in acute distress.    Appearance: Normal appearance. He is well-developed. He is not ill-appearing, toxic-appearing or diaphoretic.  HENT:     Head: Normocephalic and atraumatic.     Mouth/Throat:     Mouth: Mucous membranes are moist.     Pharynx: Oropharynx is clear.  Cardiovascular:     Rate and Rhythm: Normal rate and regular rhythm.     Heart sounds: Normal heart sounds. No murmur. No friction rub. No gallop.   Pulmonary:     Effort: Pulmonary effort is normal.     Breath sounds: Normal breath sounds. No wheezing or rales.  Abdominal:     General: Bowel sounds are increased.     Palpations: Abdomen is soft.     Tenderness: There is no abdominal tenderness. There is no right  CVA tenderness, left CVA tenderness, guarding or rebound.     Comments: Ventral hernia palpated 5 o'clock from umbilicus.  Hernia is reduced fully without any tenderness to palpation.  Skin:    General: Skin is warm and dry.  Neurological:     Mental Status: He is alert and oriented to person, place, and time.     Comments: Patient able to ambulate on own without difficulty.  Psychiatric:        Behavior: Behavior normal.        Judgment: Judgment normal.      UC Treatments / Results  Labs (all labs ordered are listed, but only abnormal results are displayed) Labs Reviewed - No data to display  EKG   Radiology No results found.  Procedures Procedures (including critical care time)  Medications Ordered in UC Medications  ondansetron (ZOFRAN-ODT) disintegrating tablet 4 mg (4 mg Oral Given 03/23/19 0951)    Initial Impression / Assessment and Plan / UC Course  I have reviewed the triage vital signs and the nursing notes.  Pertinent labs & imaging results that were available during my care of the patient were reviewed by me and considered in my medical decision making (see chart for details).    No alarming signs on exam.  Patient was able to tolerate fluid intake after Zofran in office.  Will have patient continue Zofran, Bentyl as directed.  Bland diet, advance as tolerated.  Push fluids.  Discussed gastroenteritis versus medication reaction.  Patient to discontinue BP medication for the next 2 to 3 days until symptoms improve.  Discussed BP medication with PCP.  Return precautions given.  Patient expresses understanding and agrees to plan.  Final Clinical Impressions(s) / UC Diagnoses   Final diagnoses:  Nausea vomiting and diarrhea   ED Prescriptions    Medication Sig Dispense Auth. Provider   ondansetron (ZOFRAN ODT) 4 MG disintegrating tablet Take 1 tablet (4 mg total) by mouth every 8 (eight) hours as needed for nausea or vomiting. 20 tablet Tamica Covell V, PA-C    dicyclomine (BENTYL) 20 MG tablet Take 1 tablet (20 mg total) by mouth 2 (two) times daily. 20 tablet Belinda FisherYu, Hikari Tripp V, PA-C     PDMP not reviewed this encounter.   Belinda FisherYu, Kagan Mutchler V, PA-C 03/23/19 1045

## 2019-03-23 NOTE — Discharge Instructions (Signed)
Zofran for nausea and vomiting as needed. Bentyl for abdominal cramping. Keep hydrated, you urine should be clear to pale yellow in color. Bland diet, advance as tolerated.   As discussed, for now can hold off on blood pressure for the next 2-3 days until symptoms improve. Please check your BP twice daily, and call PCP to inform of current symptoms and adjustment for plan.   Monitor for any worsening of symptoms, nausea or vomiting not controlled by medication, worsening abdominal pain, fever, go to the emergency department for further evaluation needed.

## 2019-03-23 NOTE — ED Notes (Signed)
Pt tolerating po fluids

## 2019-03-23 NOTE — ED Triage Notes (Signed)
Pt presents with complaints of nausea and diarrhea after starting blood pressure medication, unknown name. Pt reports that symptoms started on Thursday and subsided then came back on Saturday. Denies any OTC treatment. Pt reports history of bowel obstruction, states this feels different, denies any abdominal pain.

## 2019-03-25 NOTE — Telephone Encounter (Signed)
Call placed to patient and explained sleep study results and reviewed sleep hygiene recommendations. He noted that he already has a CPAP machine.  Had it for 3+ years,he is not sure exactly how long and does not know where the machine is from to have the settings reset.  He said it is not from [place].  He also said that he does not use the machine regularly.  He was agreeable to sending the order for a new machine to [facility] and order was faxed as requested.

## 2019-03-26 ENCOUNTER — Other Ambulatory Visit: Payer: Self-pay

## 2019-03-26 ENCOUNTER — Emergency Department (HOSPITAL_COMMUNITY): Payer: Medicaid Other

## 2019-03-26 ENCOUNTER — Emergency Department (HOSPITAL_COMMUNITY)
Admission: EM | Admit: 2019-03-26 | Discharge: 2019-03-27 | Disposition: A | Discharge status: Home / Self Care | Payer: Medicaid Other | Attending: Emergency Medicine | Admitting: Emergency Medicine

## 2019-03-26 ENCOUNTER — Encounter (HOSPITAL_COMMUNITY): Payer: Self-pay | Admitting: Family Medicine

## 2019-03-26 DIAGNOSIS — R112 Nausea with vomiting, unspecified: Secondary | ICD-10-CM | POA: Diagnosis present

## 2019-03-26 DIAGNOSIS — Z87891 Personal history of nicotine dependence: Secondary | ICD-10-CM | POA: Insufficient documentation

## 2019-03-26 DIAGNOSIS — Z79899 Other long term (current) drug therapy: Secondary | ICD-10-CM | POA: Diagnosis not present

## 2019-03-26 DIAGNOSIS — U071 COVID-19: Secondary | ICD-10-CM | POA: Insufficient documentation

## 2019-03-26 DIAGNOSIS — R197 Diarrhea, unspecified: Secondary | ICD-10-CM

## 2019-03-26 LAB — COMPREHENSIVE METABOLIC PANEL
ALT: 36 U/L (ref 0–44)
AST: 31 U/L (ref 15–41)
Albumin: 3.9 g/dL (ref 3.5–5.0)
Alkaline Phosphatase: 92 U/L (ref 38–126)
Anion gap: 11 (ref 5–15)
BUN: 11 mg/dL (ref 6–20)
CO2: 22 mmol/L (ref 22–32)
Calcium: 8.3 mg/dL — ABNORMAL LOW (ref 8.9–10.3)
Chloride: 102 mmol/L (ref 98–111)
Creatinine, Ser: 1.56 mg/dL — ABNORMAL HIGH (ref 0.61–1.24)
GFR calc Af Amer: >60 mL/min (ref >60)
GFR calc non Af Amer: 54 mL/min — ABNORMAL LOW (ref >60)
Glucose, Bld: 164 mg/dL — ABNORMAL HIGH (ref 70–99)
Potassium: 3.6 mmol/L (ref 3.5–5.1)
Sodium: 135 mmol/L (ref 135–145)
Total Bilirubin: 0.9 mg/dL (ref 0.3–1.2)
Total Protein: 7.9 g/dL (ref 6.5–8.1)

## 2019-03-26 LAB — CBC WITH DIFFERENTIAL/PLATELET
Abs Immature Granulocytes: 0.07 10*3/uL (ref 0.00–0.07)
Basophils Absolute: 0 10*3/uL (ref 0.0–0.1)
Basophils Relative: 0 %
Eosinophils Absolute: 0 10*3/uL (ref 0.0–0.5)
Eosinophils Relative: 0 %
HCT: 46.4 % (ref 39.0–52.0)
Hemoglobin: 14.1 g/dL (ref 13.0–17.0)
Immature Granulocytes: 0 %
Lymphocytes Relative: 9 %
Lymphs Abs: 1.5 10*3/uL (ref 0.7–4.0)
MCH: 26.3 pg (ref 26.0–34.0)
MCHC: 30.4 g/dL (ref 30.0–36.0)
MCV: 86.4 fL (ref 80.0–100.0)
Monocytes Absolute: 1.5 10*3/uL — ABNORMAL HIGH (ref 0.1–1.0)
Monocytes Relative: 9 %
Neutro Abs: 14.5 10*3/uL — ABNORMAL HIGH (ref 1.7–7.7)
Neutrophils Relative %: 82 %
Platelets: 271 10*3/uL (ref 150–400)
RBC: 5.37 MIL/uL (ref 4.22–5.81)
RDW: 14.9 % (ref 11.5–15.5)
WBC: 17.7 10*3/uL — ABNORMAL HIGH (ref 4.0–10.5)
nRBC: 0 % (ref 0.0–0.2)

## 2019-03-26 LAB — LACTIC ACID, PLASMA
Lactic Acid, Venous: 1.8 mmol/L (ref 0.5–1.9)
Lactic Acid, Venous: 1.8 mmol/L (ref 0.5–1.9)

## 2019-03-26 MED ORDER — METOCLOPRAMIDE HCL 5 MG/ML IJ SOLN
10.0000 mg | Freq: Once | INTRAMUSCULAR | Status: AC
  Administered 2019-03-27: 01:00:00 10 mg via INTRAVENOUS
  Filled 2019-03-26: qty 2

## 2019-03-26 MED ORDER — SODIUM CHLORIDE 0.9% FLUSH
3.0000 mL | Freq: Once | INTRAVENOUS | Status: AC
  Administered 2019-03-26: 23:00:00 3 mL via INTRAVENOUS

## 2019-03-26 MED ORDER — ACETAMINOPHEN 500 MG PO TABS
1000.0000 mg | ORAL_TABLET | Freq: Once | ORAL | Status: AC
  Administered 2019-03-27: 01:00:00 1000 mg via ORAL
  Filled 2019-03-26: qty 2

## 2019-03-26 MED ORDER — SODIUM CHLORIDE 0.9 % IV BOLUS
1000.0000 mL | Freq: Once | INTRAVENOUS | Status: AC
  Administered 2019-03-27: 01:00:00 1000 mL via INTRAVENOUS

## 2019-03-26 NOTE — ED Provider Notes (Signed)
[facility] DEPT Provider Note   CSN: [id number] Arrival date & time: 03/26/19  2002     History   Chief Complaint Chief Complaint  Patient presents with  . Emesis    HPI Frank Howe is a 43 y.o. male.      Emesis Associated symptoms: fever      43 y.o. M with hx of gout, obesity, OSA, hx of partial SBO, presenting to the ED for N/V/D.  States this has been intermittent over the past 6 days.  States he was seen at urgent care on 11/27, given medications and states symptoms actually resolved but recurred again today.  He states today he has had 2 episodes of vomiting and loose stool today.  Denies melena or hematochezia.  He is intermittently able to eat and take medications.  Hx of hernia repair x2, no other prior abdominal surgeries.  Patient did have a fever in triage today, states he was not aware of this.  He was tested for COVID on 03/06/19 which was negative.  He denies any sick contacts or new exposures.  In regards to history of SBO-- states this feels vastly different.  States now he feels like he has  "bug" and is very "drained".  Past Medical History:  Diagnosis Date  . Elevated blood pressure reading without diagnosis of hypertension   . Gout   . Impaired fasting blood sugar   . Obesity   . OSA (obstructive sleep apnea)     Patient Active Problem List   Diagnosis Date Noted  . Umbilical hernia with obstruction but no gangrene 02/11/2018  . Partial small bowel obstruction ([place]) 09/13/2017  . Gout, chronic 07/19/2015  . OSA (obstructive sleep apnea) 07/19/2015  . Obesity 06/29/2015  . Elevated uric acid in blood 06/29/2015  . Impaired fasting blood sugar 06/28/2015  . Knee swelling 06/28/2015  . Right knee pain 06/28/2015  . Elevated blood pressure reading without diagnosis of hypertension 06/28/2015  . Routine general medical examination at a health care facility 08/21/2011    Past Surgical History:  Procedure Laterality  Date  . UMBILICAL HERNIA REPAIR N/A 09/14/2017   Procedure: LAPAROSCOPIC UMBILICAL HERNIA;  Surgeon: Leighton Ruff, MD;  Location: [facility];  Service: General;  Laterality: N/A;  . UMBILICAL HERNIA REPAIR N/A 02/11/2018   Procedure: LAPARASCOPIC REDO REPAIR OF RECURRENT UMBILICAL HERNIA WITH INSERTION OF MESH, LYSIS OF ADHESIONS;  Surgeon: Ileana Roup, MD;  Location: [facility];  Service: General;  Laterality: N/A;        Home Medications    Prior to Admission medications   Medication Sig Start Date End Date Taking? Authorizing Provider  dicyclomine (BENTYL) 20 MG tablet Take 1 tablet (20 mg total) by mouth 2 (two) times daily. 03/23/19   Tasia Catchings, Amy V, PA-C  lisinopril-hydrochlorothiazide (ZESTORETIC) 10-12.5 MG tablet Take 1 tablet by mouth daily. 02/13/19   Charlott Rakes, MD  Misc. Devices MISC CPAP therapy on 14 cm H2O or autopap 10-20. - Patient used a Large size [employer] Full Face Mask Simplus mask and heated humidification. 03/19/19   Charlott Rakes, MD  ondansetron (ZOFRAN ODT) 4 MG disintegrating tablet Take 1 tablet (4 mg total) by mouth every 8 (eight) hours as needed for nausea or vomiting. 03/23/19   Ok Edwards, PA-C  PRESCRIPTION MEDICATION Gout medication, unknown name    [provider]    Family History Family History  Problem Relation Age of Onset  . Arthritis Mother   . Hypertension  Mother   . Stroke Mother   . Aneurysm Mother        died of brain aneurysm  . Asthma Father   . Cancer Father        ?  Marland Kitchen Arthritis Father   . Heart disease Maternal Uncle   . Diabetes Maternal Uncle   . Alcohol abuse Neg Hx   . Hyperlipidemia Neg Hx   . Hearing loss Neg Hx   . Kidney disease Neg Hx   . Early death Neg Hx     Social History Social History   Tobacco Use  . Smoking status: Former Smoker    Packs/day: 0.25    Years: 4.00    Pack years: 1.00    Types: Cigarettes    Quit date: 12/29/2014    Years since quitting: 4.2  . Smokeless tobacco:  Never Used  . Tobacco comment: once every 2 wk  Substance Use Topics  . Alcohol use: Not Currently  . Drug use: No     Allergies   Patient has no known allergies.   Review of Systems Review of Systems  Constitutional: Positive for fever.  Gastrointestinal: Positive for vomiting.  All other systems reviewed and are negative.    Physical Exam Updated Vital Signs BP (!) 173/93 (BP Location: Right Arm)   Pulse (!) 108   Temp (!) 101.4 F (38.6 C) (Oral)   Resp 20   Ht  (1.727 m)   Wt (!) 138.3 kg   SpO2 94%   BMI 46.38 kg/m   Physical Exam Vitals signs and nursing note reviewed.  Constitutional:      Appearance: He is well-developed.  HENT:     Head: Normocephalic and atraumatic.  Eyes:     Conjunctiva/sclera: Conjunctivae normal.     Pupils: Pupils are equal, round, and reactive to light.  Neck:     Musculoskeletal: Normal range of motion.  Cardiovascular:     Rate and Rhythm: Regular rhythm. Tachycardia present.     Heart sounds: Normal heart sounds.     Comments: Low rate tachycardia, around 105 during exam Pulmonary:     Effort: Pulmonary effort is normal.     Breath sounds: Normal breath sounds.  Abdominal:     General: Bowel sounds are normal.     Palpations: Abdomen is soft.     Tenderness: There is no abdominal tenderness.     Comments: Obese abdomen but soft and non-tender, no peritoneal signs, normal bowel sounds  Musculoskeletal: Normal range of motion.  Skin:    General: Skin is warm and dry.  Neurological:     Mental Status: He is alert and oriented to person, place, and time.      ED Treatments / Results  Labs (all labs ordered are listed, but only abnormal results are displayed) Labs Reviewed  COMPREHENSIVE METABOLIC PANEL - Abnormal; Notable for the following components:      Result Value   Glucose, Bld 164 (*)    Creatinine, Ser 1.56 (*)    Calcium 8.3 (*)    GFR calc non Af Amer 54 (*)    All other components within normal  limits  CBC WITH DIFFERENTIAL/PLATELET - Abnormal; Notable for the following components:   WBC 17.7 (*)    Neutro Abs 14.5 (*)    Monocytes Absolute 1.5 (*)    All other components within normal limits  SARS CORONAVIRUS 2 (TAT 6-24 HRS)  LACTIC ACID, PLASMA  LACTIC ACID, PLASMA  URINALYSIS,  ROUTINE W REFLEX MICROSCOPIC    EKG None  Radiology Dg Chest 2 View  Result Date: 03/26/2019 CLINICAL DATA:  Fever EXAM: CHEST - 2 VIEW COMPARISON:  02/11/2018 FINDINGS: The heart size and mediastinal contours are within normal limits. Both lungs are clear. The visualized skeletal structures are unremarkable. Air distended bowel in the upper abdomen. IMPRESSION: No active cardiopulmonary disease. Electronically Signed   By: Jasmine PangKim  Fujinaga M.D.   On: 03/26/2019 20:52    Procedures Procedures (including critical care time)  Medications Ordered in ED Medications  sodium chloride flush (NS) 0.9 % injection 3 mL (3 mLs Intravenous Given 03/26/19 2307)  acetaminophen (TYLENOL) tablet 1,000 mg (1,000 mg Oral Given 03/27/19 0100)  sodium chloride 0.9 % bolus 1,000 mL (0 mLs Intravenous Stopped 03/27/19 0240)  metoCLOPramide (REGLAN) injection 10 mg (10 mg Intravenous Given 03/27/19 0100)     Initial Impression / Assessment and Plan / ED Course  I have reviewed the triage vital signs and the nursing notes.  Pertinent labs & imaging results that were available during my care of the patient were reviewed by me and considered in my medical decision making (see chart for details).  43 year old male presenting to the ED with nausea, vomiting, and diarrhea.  He was seen by urgent care on 03/21/2019 for same, given Zofran and Bentyl which resolved symptoms for a few days but returned again today.  On arrival to ED he had fever that he was unaware of.  He is not sure if he has been running a fever the past few days.  He is overall nontoxic in appearance, denies abdominal pain but reports "feeling drained".  His  abdomen is soft and benign and he has normal bowel sounds.  He has history of SBO but states this feels much different.  Labs obtained from triage and are overall reassuring.  He does have leukocytosis, this may be reactive from his vomiting.  He also has some mild signs of dehydration with slight bump in his creatinine.  We will plan for IV fluids and medication.  Patient will also be screened for COVID given development of fever.  2:21 AM After IVF and medications, patient sleeping comfortably on his stomach on re-check.  No emesis here in ED but did have loose BM x1.  He has been able to tolerate oral medications without issue.  Tachycardia has resolved.  May have been due to fever/dehydration.  Abdomen remains soft and benign, no complaints of abdominal pain.  Feel he is stable for discharge.  Recommended continued symptomatic management at home, gentle diet and good oral hydration.  Can continue bentyl and zofran PRN.  COVID screen pending, will be notified with positive results.  Instructed on quarantine protocol if positive and given CDC guidelines.  Encouraged tylenol for any continued fever.  Close follow-up with PCP encouraged.  Return here for any new/acute changes.  Frank Howe was evaluated in Emergency Department on 03/27/2019 for the symptoms described in the history of present illness. He was evaluated in the context of the global COVID-19 pandemic, which necessitated consideration that the patient might be at risk for infection with the SARS-CoV-2 virus that causes COVID-19. Institutional protocols and algorithms that pertain to the evaluation of patients at risk for COVID-19 are in a state of rapid change based on information released by regulatory bodies including the CDC and federal and state organizations. These policies and algorithms were followed during the patient's care in the ED.   Final Clinical Impressions(s) /  ED Diagnoses   Final diagnoses:  Nausea vomiting and diarrhea     ED Discharge Orders    None       Garlon Hatchet, PA-C 03/27/19 0330    Palumbo, April, MD 03/27/19 [phone]

## 2019-03-26 NOTE — ED Triage Notes (Signed)
Patient is from home and complains of vomiting and diarrhea since last Thursday. He was seen at [facility] on 11/27 for the same symptoms and relieved with medication.

## 2019-03-27 LAB — SARS CORONAVIRUS 2 (TAT 6-24 HRS): SARS Coronavirus 2: POSITIVE — AB

## 2019-03-27 NOTE — Discharge Instructions (Addendum)
I would continue using the zofran and bentyl from urgent care.   Follow gentle diet and progress back to normal as tolerated.  Continue to drink lots of fluids. Take tylenol if fever continues. Repeat COVID test has been sent, you will be notified if positive.  Will also update into mychart.  Will need to quarantine for up to 2 weeks if positive.  See formal CDC guidelines below. Follow-up with your primary care doctor. Return here for any new/acute changes.     Person Under Monitoring Name: Frank Howe  [place] [address] [place] [id number]   Infection Prevention Recommendations for Individuals Confirmed to have, or Being Evaluated for, 2019 Novel Coronavirus (COVID-19) Infection Who Receive Care at Home  Individuals who are confirmed to have, or are being evaluated for, COVID-19 should follow the prevention steps below until a healthcare provider or local or state health department says they can return to normal activities.  Stay home except to get medical care You should restrict activities outside your home, except for getting medical care. Do not go to work, school, or public areas, and do not use public transportation or taxis.  Call ahead before visiting your doctor Before your medical appointment, call the healthcare provider and tell them that you have, or are being evaluated for, COVID-19 infection. This will help the healthcare providers office take steps to keep other people from getting infected. Ask your healthcare provider to call the local or state health department.  Monitor your symptoms Seek prompt medical attention if your illness is worsening (e.g., difficulty breathing). Before going to your medical appointment, call the healthcare provider and tell them that you have, or are being evaluated for, COVID-19 infection. Ask your healthcare provider to call the local or state health department.  Wear a facemask You should wear a  facemask that covers your nose and mouth when you are in the same room with other people and when you visit a healthcare provider. People who live with or visit you should also wear a facemask while they are in the same room with you.  Separate yourself from other people in your home As much as possible, you should stay in a different room from other people in your home. Also, you should use a separate bathroom, if available.  Avoid sharing household items You should not share dishes, drinking glasses, cups, eating utensils, towels, bedding, or other items with other people in your home. After using these items, you should wash them thoroughly with soap and water.  Cover your coughs and sneezes Cover your mouth and nose with a tissue when you cough or sneeze, or you can cough or sneeze into your sleeve. Throw used tissues in a lined trash can, and immediately wash your hands with soap and water for at least 20 seconds or use an alcohol-based hand rub.  Wash your Union Pacific Corporationhands Wash your hands often and thoroughly with soap and water for at least 20 seconds. You can use an alcohol-based hand sanitizer if soap and water are not available and if your hands are not visibly dirty. Avoid touching your eyes, nose, and mouth with unwashed hands.   Prevention Steps for Caregivers and Household Members of Individuals Confirmed to have, or Being Evaluated for, COVID-19 Infection Being Cared for in the Home  If you live with, or provide care at home for, a person confirmed to have, or being evaluated for, COVID-19 infection please follow these guidelines to prevent infection:  Follow  healthcare providers instructions Make sure that you understand and can help the patient follow any healthcare provider instructions for all care.  Provide for the patients basic needs You should help the patient with basic needs in the home and provide support for getting groceries, prescriptions, and other personal  needs.  Monitor the patients symptoms If they are getting sicker, call his or her medical provider and tell them that the patient has, or is being evaluated for, COVID-19 infection. This will help the healthcare providers office take steps to keep other people from getting infected. Ask the healthcare provider to call the local or state health department.  Limit the number of people who have contact with the patient If possible, have only one caregiver for the patient. Other household members should stay in another home or place of residence. If this is not possible, they should stay in another room, or be separated from the patient as much as possible. Use a separate bathroom, if available. Restrict visitors who do not have an essential need to be in the home.  Keep older adults, very young children, and other sick people away from the patient Keep older adults, very young children, and those who have compromised immune systems or chronic health conditions away from the patient. This includes people with chronic heart, lung, or kidney conditions, diabetes, and cancer.  Ensure good ventilation Make sure that shared spaces in the home have good air flow, such as from an air conditioner or an opened window, weather permitting.  Wash your hands often Wash your hands often and thoroughly with soap and water for at least 20 seconds. You can use an alcohol based hand sanitizer if soap and water are not available and if your hands are not visibly dirty. Avoid touching your eyes, nose, and mouth with unwashed hands. Use disposable paper towels to dry your hands. If not available, use dedicated cloth towels and replace them when they become wet.  Wear a facemask and gloves Wear a disposable facemask at all times in the room and gloves when you touch or have contact with the patients blood, body fluids, and/or secretions or excretions, such as sweat, saliva, sputum, nasal mucus, vomit, urine, or  feces.  Ensure the mask fits over your nose and mouth tightly, and do not touch it during use. Throw out disposable facemasks and gloves after using them. Do not reuse. Wash your hands immediately after removing your facemask and gloves. If your personal clothing becomes contaminated, carefully remove clothing and launder. Wash your hands after handling contaminated clothing. Place all used disposable facemasks, gloves, and other waste in a lined container before disposing them with other household waste. Remove gloves and wash your hands immediately after handling these items.  Do not share dishes, glasses, or other household items with the patient Avoid sharing household items. You should not share dishes, drinking glasses, cups, eating utensils, towels, bedding, or other items with a patient who is confirmed to have, or being evaluated for, COVID-19 infection. After the person uses these items, you should wash them thoroughly with soap and water.  Wash laundry thoroughly Immediately remove and wash clothes or bedding that have blood, body fluids, and/or secretions or excretions, such as sweat, saliva, sputum, nasal mucus, vomit, urine, or feces, on them. Wear gloves when handling laundry from the patient. Read and follow directions on labels of laundry or clothing items and detergent. In general, wash and dry with the warmest temperatures recommended on the label.  Clean  all areas the individual has used often Clean all touchable surfaces, such as counters, tabletops, doorknobs, bathroom fixtures, toilets, phones, keyboards, tablets, and bedside tables, every day. Also, clean any surfaces that may have blood, body fluids, and/or secretions or excretions on them. Wear gloves when cleaning surfaces the patient has come in contact with. Use a diluted bleach solution (e.g., dilute bleach with 1 part bleach and 10 parts water) or a household disinfectant with a label that says EPA-registered for  coronaviruses. To make a bleach solution at home, add 1 tablespoon of bleach to 1 quart (4 cups) of water. For a larger supply, add  cup of bleach to 1 gallon (16 cups) of water. Read labels of cleaning products and follow recommendations provided on product labels. Labels contain instructions for safe and effective use of the cleaning product including precautions you should take when applying the product, such as wearing gloves or eye protection and making sure you have good ventilation during use of the product. Remove gloves and wash hands immediately after cleaning.  Monitor yourself for signs and symptoms of illness Caregivers and household members are considered close contacts, should monitor their health, and will be asked to limit movement outside of the home to the extent possible. Follow the monitoring steps for close contacts listed on the symptom monitoring form.   ? If you have additional questions, contact your local health department or call the epidemiologist on call at [phone] (available 24/7). ? This guidance is subject to change. For the most up-to-date guidance from [facility], please refer to their website: [email or web address]

## 2019-03-28 ENCOUNTER — Other Ambulatory Visit: Payer: Self-pay | Admitting: Unknown Physician Specialty

## 2019-03-28 ENCOUNTER — Telehealth: Payer: Self-pay | Admitting: Unknown Physician Specialty

## 2019-03-28 DIAGNOSIS — U071 COVID-19: Secondary | ICD-10-CM

## 2019-03-28 NOTE — Telephone Encounter (Signed)
.  Discussed with patient about Covid symptoms and the use of bamlanivimab, a monoclonal antibody infusion for those with mild to moderate Covid symptoms and at a high risk of hospitalization.  Pt is qualified for this infusion at the [facility] infusion center due to co-morbid conditions including obesity addressed and is actively being managed by a [facility] provider.    After discussing the infusion's costs, potential benefits and side effects, the patient has decided to accept treatment with monoclonal antibodies.

## 2019-03-28 NOTE — Telephone Encounter (Signed)
Called r/e monoclonal ab infusion.  Qualifies due to BMI.  He plans to talk it over with his wife.

## 2019-03-31 ENCOUNTER — Ambulatory Visit (HOSPITAL_COMMUNITY)
Admission: RE | Admit: 2019-03-31 | Discharge: 2019-03-31 | Disposition: A | Discharge status: Home / Self Care | Payer: Medicaid Other | Source: Ambulatory Visit | Attending: Critical Care Medicine | Admitting: Critical Care Medicine

## 2019-03-31 DIAGNOSIS — U071 COVID-19: Secondary | ICD-10-CM | POA: Insufficient documentation

## 2019-03-31 MED ORDER — FAMOTIDINE IN NACL 20-0.9 MG/50ML-% IV SOLN: 20.0000 mg | Freq: Once | INTRAVENOUS | Status: DC | PRN

## 2019-03-31 MED ORDER — METHYLPREDNISOLONE SODIUM SUCC 125 MG IJ SOLR: 125.0000 mg | Freq: Once | INTRAMUSCULAR | Status: DC | PRN

## 2019-03-31 MED ORDER — SODIUM CHLORIDE 0.9 % IV SOLN: INTRAVENOUS | Status: DC | PRN

## 2019-03-31 MED ORDER — SODIUM CHLORIDE 0.9 % IV SOLN
700.0000 mg | Freq: Once | INTRAVENOUS | Status: AC
  Administered 2019-03-31: 700 mg via INTRAVENOUS
  Filled 2019-03-31: qty 20

## 2019-03-31 MED ORDER — EPINEPHRINE 0.3 MG/0.3ML IJ SOAJ: 0.3000 mg | Freq: Once | INTRAMUSCULAR | Status: DC | PRN

## 2019-03-31 MED ORDER — DIPHENHYDRAMINE HCL 50 MG/ML IJ SOLN: 50.0000 mg | Freq: Once | INTRAMUSCULAR | Status: DC | PRN

## 2019-03-31 MED ORDER — ALBUTEROL SULFATE HFA 108 (90 BASE) MCG/ACT IN AERS: 2.0000 | INHALATION_SPRAY | Freq: Once | RESPIRATORY_TRACT | Status: DC | PRN

## 2019-03-31 NOTE — Progress Notes (Signed)
Patient ID: Frank Howe, male   DOB: 09-12-1975, 43 y.o.   MRN: [id number]  Diagnosis: [id number]  Physician:  Procedure: [facility] Infusion [facility] Med: bamlanivimab infusion - Provided patient with bamlanimivab fact sheet for patients, parents and caregivers prior to infusion.  Complications: No immediate complications noted.  Discharge: Discharged home   Iran Planas 03/31/2019

## 2019-04-07 ENCOUNTER — Telehealth: Payer: Self-pay

## 2019-04-07 NOTE — Telephone Encounter (Signed)
Call placed to [employer] [facility] to check on status of CPAP order. Spoke to [place] who stated that the patient was scheduled to pick up the machine on 04/01/2019 but tested positive for COVID on 03/27/2019 and will need to contact [employer] when he has completed his isolation.

## 2019-04-08 NOTE — Telephone Encounter (Signed)
Noted  

## 2019-04-13 NOTE — Progress Notes (Signed)
Patient ID: Frank Howe, male   DOB: 01-14-1976, 43 y.o.   MRN: [id number]  Virtual Visit via Telephone Note  I connected with Frank Howe on 04/15/19 at 10:50 AM EST by telephone and verified that I am speaking with the correct person using two identifiers.   I discussed the limitations, risks, security and privacy concerns of performing an evaluation and management service by telephone and the availability of in person appointments. I also discussed with the patient that there may be a patient responsible charge related to this service. The patient expressed understanding and agreed to proceed.  Patient location:  home My Location:  [facility] office Persons on the call:  Me and the patient   History of Present Illness: After Ed visit 03/26/2019.  +Covid test.  No fever.  No N/V/D.  Cough mostly resolved.  He went back to work last week.  Doing well overall.  Takes omeprazole for acid reflux but says it isn't working well.   From ED note: 43 y.o. M with hx of gout, obesity, OSA, hx of partial SBO, presenting to the ED for N/V/D.  States this has been intermittent over the past 6 days.  States he was seen at urgent care on 11/27, given medications and states symptoms actually resolved but recurred again today.  He states today he has had 2 episodes of vomiting and loose stool today.  Denies melena or hematochezia.  He is intermittently able to eat and take medications.  Hx of hernia repair x2, no other prior abdominal surgeries.  Patient did have a fever in triage today, states he was not aware of this.  He was tested for COVID on 03/06/19 which was negative.  He denies any sick contacts or new exposures.  In regards to history of SBO-- states this feels vastly different.  States now he feels like he has  "bug" and is very "drained".  Observations/Objective: NAD.  A&Ox3  Assessment and Plan: 1. COVID-19 resolved  2. Essential hypertension stable - lisinopril-hydrochlorothiazide (ZESTORETIC)  10-12.5 MG tablet; Take 1 tablet by mouth daily.  Dispense: 30 tablet; Refill: 3  3. Gastroesophageal reflux disease with esophagitis without hemorrhage - lansoprazole (PREVACID SOLUTAB) 30 MG disintegrating tablet; Take 1 tablet (30 mg total) by mouth daily at 12 noon.  Dispense: 30 tablet; Refill: 2  4. Encounter for examination following treatment at hospital Doing well   Follow Up Instructions: Assign PCP in 6-8 weeks   I discussed the assessment and treatment plan with the patient. The patient was provided an opportunity to ask questions and all were answered. The patient agreed with the plan and demonstrated an understanding of the instructions.   The patient was advised to call back or seek an in-person evaluation if the symptoms worsen or if the condition fails to improve as anticipated.  I provided 14 minutes of non-face-to-face time during this encounter.   Freeman Caldron, PA-C

## 2019-04-15 ENCOUNTER — Ambulatory Visit (INDEPENDENT_AMBULATORY_CARE_PROVIDER_SITE_OTHER): Payer: Medicaid Other | Admitting: Physician Assistant

## 2019-04-15 DIAGNOSIS — K2100 Gastro-esophageal reflux disease with esophagitis, without bleeding: Secondary | ICD-10-CM

## 2019-04-15 DIAGNOSIS — I10 Essential (primary) hypertension: Secondary | ICD-10-CM | POA: Diagnosis not present

## 2019-04-15 DIAGNOSIS — Z09 Encounter for follow-up examination after completed treatment for conditions other than malignant neoplasm: Secondary | ICD-10-CM

## 2019-04-15 DIAGNOSIS — U071 COVID-19: Secondary | ICD-10-CM

## 2019-04-15 MED ORDER — LANSOPRAZOLE 30 MG PO TBDD: 30.0000 mg | DELAYED_RELEASE_TABLET | Freq: Every day | ORAL | 2 refills | Status: DC

## 2019-04-15 MED ORDER — LISINOPRIL-HYDROCHLOROTHIAZIDE 10-12.5 MG PO TABS: 1.0000 | ORAL_TABLET | Freq: Every day | ORAL | 3 refills | Status: DC

## 2019-05-15 ENCOUNTER — Ambulatory Visit: Payer: Medicaid Other

## 2019-06-05 IMAGING — CT CT ABD-PELV W/ CM
2 of 5 series · 16 of 46 positions shown, 18 images · IV contrast (ISOVUE)
Comparison: None.

CLINICAL DATA: Severe abdominal pain with vomiting and diarrhea.

EXAM:
CT ABDOMEN AND PELVIS WITH CONTRAST
TECHNIQUE: Multidetector CT imaging of the abdomen and pelvis was performed
using the standard protocol following bolus administration of
intravenous contrast.
CONTRAST:  100mL O5IENO-1KK IOPAMIDOL (O5IENO-1KK) INJECTION 61%

[Series 2: axial st · axial · 0.98mm/px · z∈[-548,-108]mm · 13 of 104 slices shown, 15 images]
[im 8/104  soft-tissue]
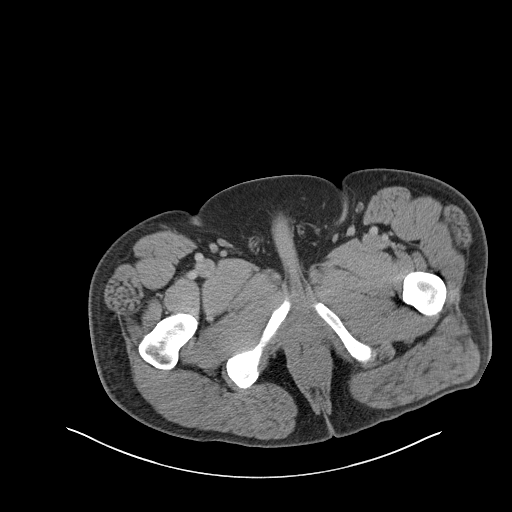
[im 8/104  bone]
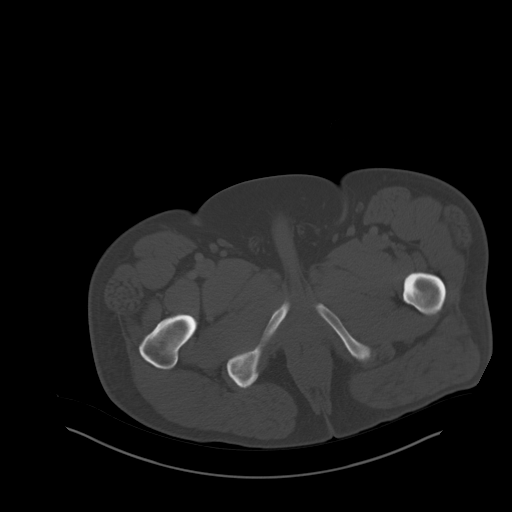
[im 15/104  soft-tissue]
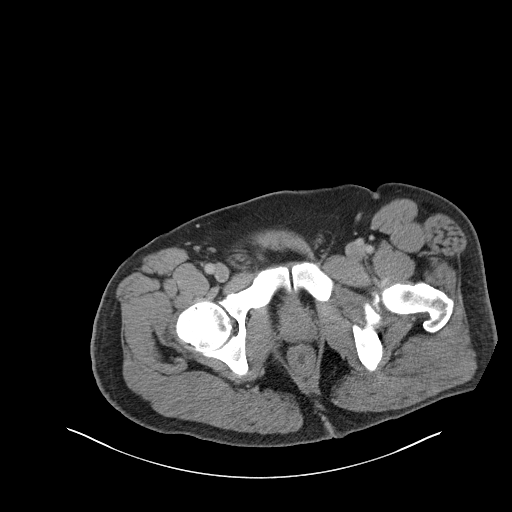
[im 23/104  soft-tissue]
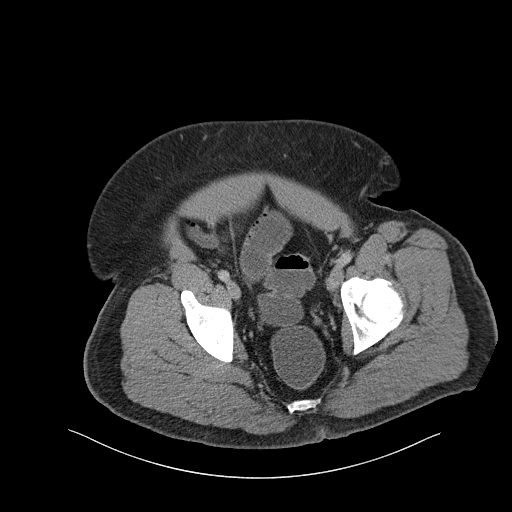
[im 30/104  soft-tissue]
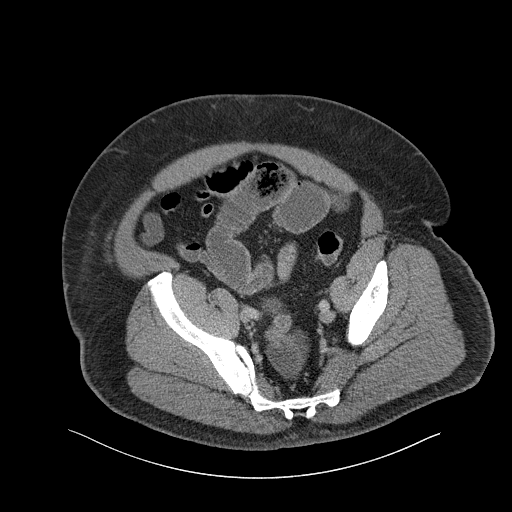
[im 37/104  soft-tissue]
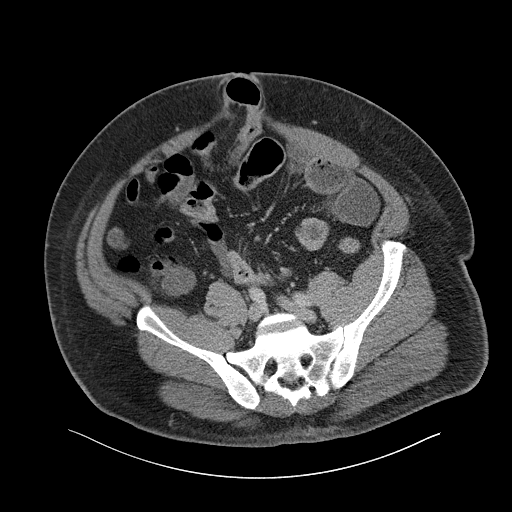
[im 45/104  soft-tissue]
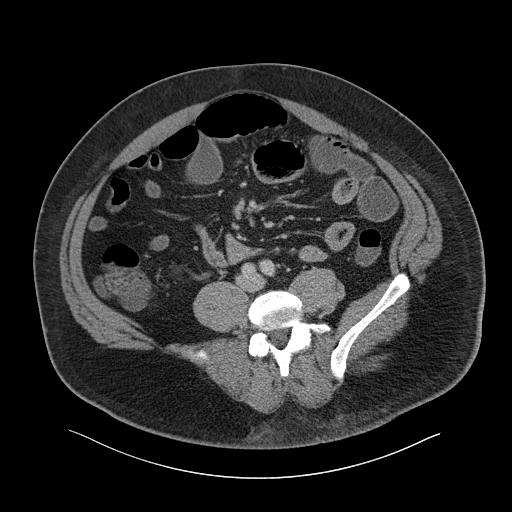
[im 52/104  soft-tissue]
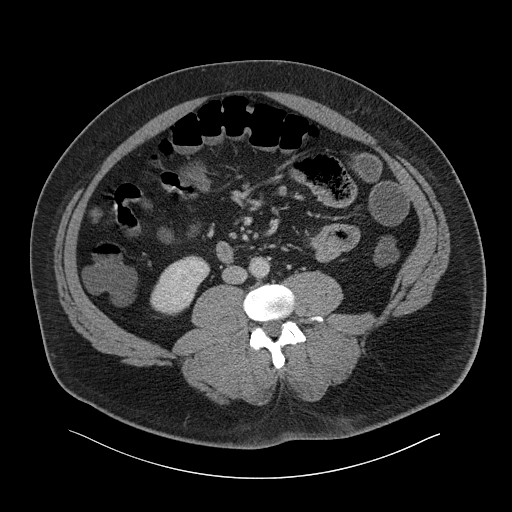
[im 59/104  soft-tissue]
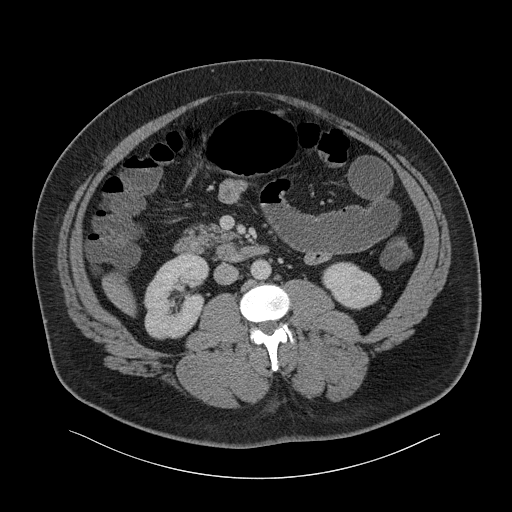
[im 67/104  soft-tissue]
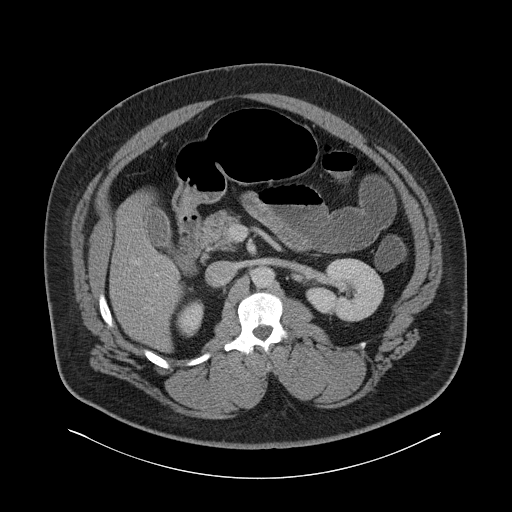
[im 67/104  bone]
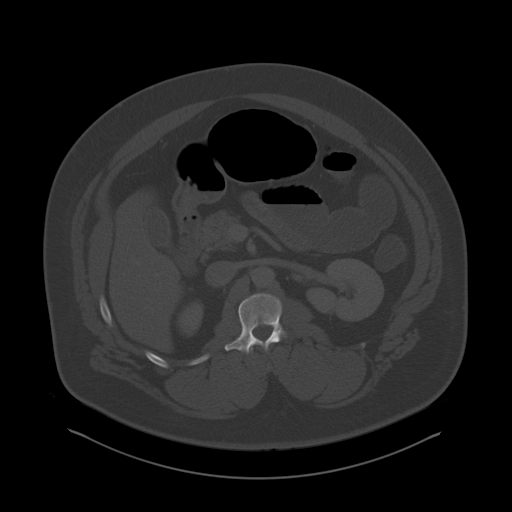
[im 74/104  soft-tissue]
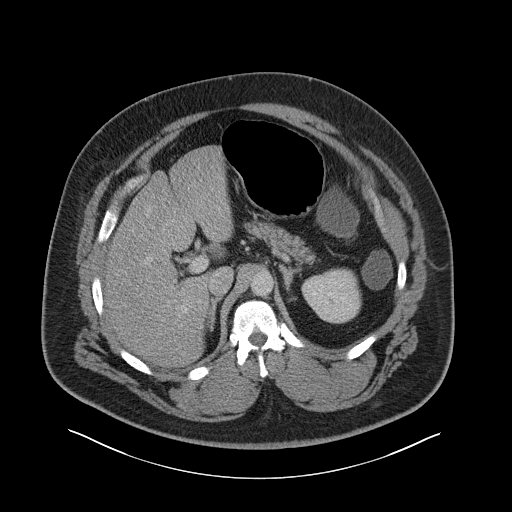
[im 81/104  soft-tissue]
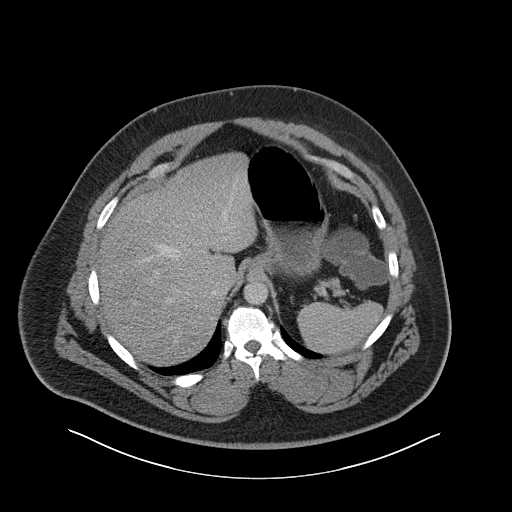
[im 89/104  soft-tissue]
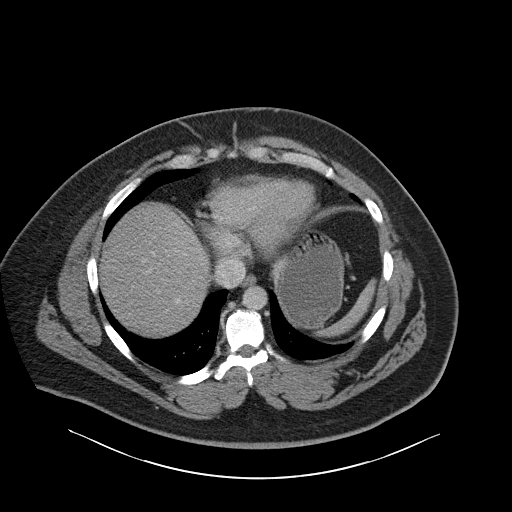
[im 96/104  soft-tissue]
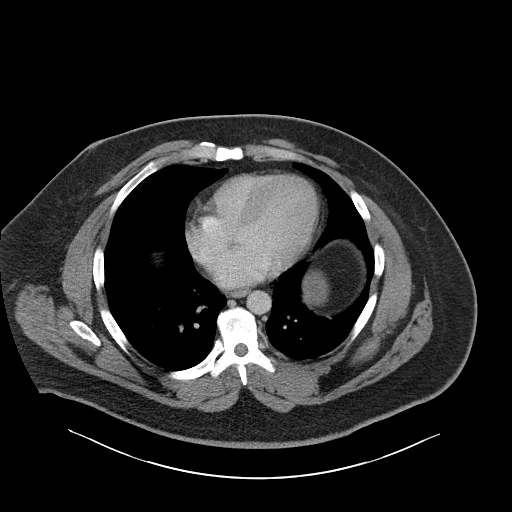

[Series 5: coronal st · coronal · 0.90mm/px · 3 of 135 slices shown]
[im 45/135  soft-tissue]
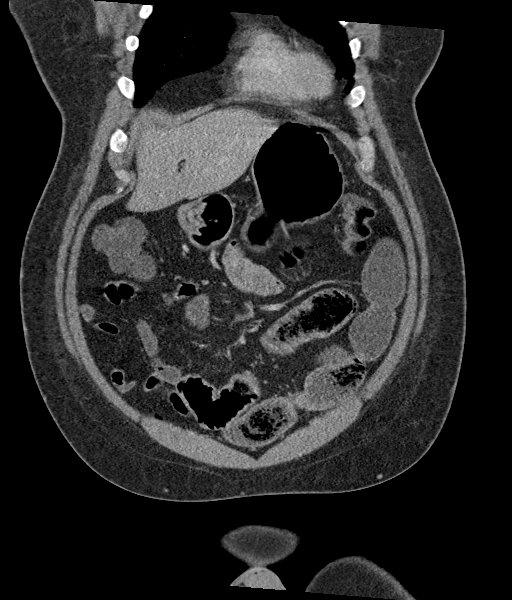
[im 60/135  soft-tissue]
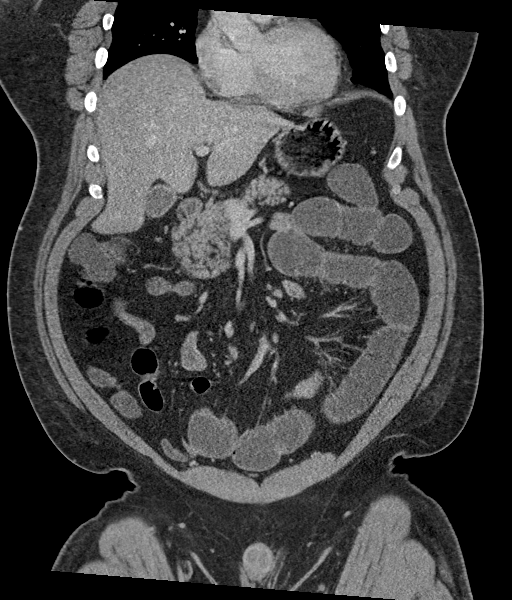
[im 75/135  soft-tissue]
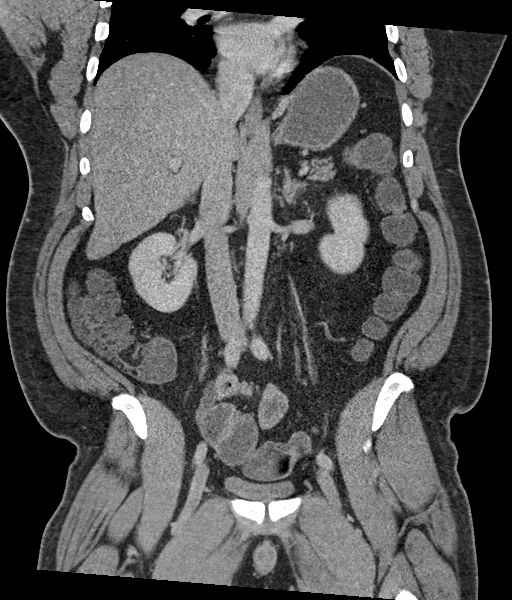

[16 of 46 positions shown; findings below may reference images not displayed]

FINDINGS: Lower chest: No acute abnormality.

Hepatobiliary: No focal liver abnormality is seen. No gallstones,
gallbladder wall thickening, or biliary dilatation.

Pancreas: Unremarkable. No pancreatic ductal dilatation or
surrounding inflammatory changes.

Spleen: Normal in size without focal abnormality.

Adrenals/Urinary Tract: Adrenal glands are unremarkable. Kidneys are
normal, without renal calculi, focal lesion, or hydronephrosis.
Bladder is decompressed.

Stomach/Bowel: The stomach is within normal limits. Multiple dilated
loops of proximal small bowel with transition point seen in an
umbilical hernia. Fluid-filled colon. Normal appendix. No bowel wall
thickening or surrounding inflammatory changes.

Vascular/Lymphatic: No significant vascular findings are present. No
enlarged abdominal or pelvic lymph nodes.

Reproductive: Prostate is unremarkable.

Other: No free fluid or pneumoperitoneum.

Musculoskeletal: No acute or significant osseous findings. Bilateral
hip osteoarthritis.
IMPRESSION: 1. Proximal small bowel obstruction with transition point in an
umbilical hernia.

## 2019-07-06 ENCOUNTER — Other Ambulatory Visit: Payer: Self-pay

## 2019-07-06 ENCOUNTER — Ambulatory Visit
Admission: EM | Admit: 2019-07-06 | Discharge: 2019-07-06 | Disposition: A | Discharge status: Home / Self Care | Payer: Medicaid Other | Attending: Emergency Medicine | Admitting: Emergency Medicine

## 2019-07-06 ENCOUNTER — Encounter: Payer: Self-pay | Admitting: Emergency Medicine

## 2019-07-06 DIAGNOSIS — M25512 Pain in left shoulder: Secondary | ICD-10-CM | POA: Diagnosis not present

## 2019-07-06 MED ORDER — NAPROXEN 500 MG PO TABS: 500.0000 mg | ORAL_TABLET | Freq: Two times a day (BID) | ORAL | 0 refills | Status: DC

## 2019-07-06 NOTE — ED Provider Notes (Signed)
[facility]    CSN: [id number] Arrival date & time: 07/06/19  0803      History   Chief Complaint Chief Complaint  Patient presents with  . Shoulder Pain    HPI Azan Maneri is a 44 y.o. male with history of OSA, morbid obesity, hypertension presenting for 3-day course of nonradiating left shoulder pain with associated finger tingling.  Patient denies injury to shoulder, neck, upper extremities.  Patient states he does perform heavy lifting at work routinely: Denies inciting event, significantly increased workload.  No history of reflux.  States he is tried "popping" without significant relief.  Patient denied lightheadedness, dizziness, chest pain, palpitations, cough, dyspnea, nausea, vomiting.  Patient does know his uncle had an MI in his 39s.  She denies personal history thereof.   Past Medical History:  Diagnosis Date  . Elevated blood pressure reading without diagnosis of hypertension   . Gout   . Impaired fasting blood sugar   . Obesity   . OSA (obstructive sleep apnea)     Patient Active Problem List   Diagnosis Date Noted  . Umbilical hernia with obstruction but no gangrene 02/11/2018  . Partial small bowel obstruction (HCC) 09/13/2017  . Gout, chronic 07/19/2015  . OSA (obstructive sleep apnea) 07/19/2015  . Obesity 06/29/2015  . Elevated uric acid in blood 06/29/2015  . Impaired fasting blood sugar 06/28/2015  . Knee swelling 06/28/2015  . Right knee pain 06/28/2015  . Elevated blood pressure reading without diagnosis of hypertension 06/28/2015  . Routine general medical examination at a health care facility 08/21/2011    Past Surgical History:  Procedure Laterality Date  . UMBILICAL HERNIA REPAIR N/A 09/14/2017   Procedure: LAPAROSCOPIC UMBILICAL HERNIA;  Surgeon: Romie Levee, MD;  Location: [facility];  Service: General;  Laterality: N/A;  . UMBILICAL HERNIA REPAIR N/A 02/11/2018   Procedure: LAPARASCOPIC REDO REPAIR OF RECURRENT UMBILICAL  HERNIA WITH INSERTION OF MESH, LYSIS OF ADHESIONS;  Surgeon: Andria Meuse, MD;  Location: [facility];  Service: General;  Laterality: N/A;       Home Medications    Prior to Admission medications   Medication Sig Start Date End Date Taking? Authorizing Provider  lansoprazole (PREVACID SOLUTAB) 30 MG disintegrating tablet Take 1 tablet (30 mg total) by mouth daily at 12 noon. 04/15/19   Anders Simmonds, PA-C  lisinopril-hydrochlorothiazide (ZESTORETIC) 10-12.5 MG tablet Take 1 tablet by mouth daily. 04/15/19   Anders Simmonds, PA-C  naproxen (NAPROSYN) 500 MG tablet Take 1 tablet (500 mg total) by mouth 2 (two) times daily. 07/06/19   Hall-Potvin, [place], PA-C  ondansetron (ZOFRAN ODT) 4 MG disintegrating tablet Take 1 tablet (4 mg total) by mouth every 8 (eight) hours as needed for nausea or vomiting. 03/23/19   Belinda Fisher, PA-C  PRESCRIPTION MEDICATION Gout medication, unknown name    [provider]    Family History Family History  Problem Relation Age of Onset  . Arthritis Mother   . Hypertension Mother   . Stroke Mother   . Aneurysm Mother        died of brain aneurysm  . Asthma Father   . Cancer Father        ?  Marland Kitchen Arthritis Father   . Heart disease Maternal Uncle   . Diabetes Maternal Uncle   . Alcohol abuse Neg Hx   . Hyperlipidemia Neg Hx   . Hearing loss Neg Hx   . Kidney disease Neg Hx   .  Early death Neg Hx     Social History Social History   Tobacco Use  . Smoking status: Former Smoker    Packs/day: 0.25    Years: 4.00    Pack years: 1.00    Types: Cigarettes    Quit date: 12/29/2014    Years since quitting: 4.5  . Smokeless tobacco: Never Used  . Tobacco comment: once every 2 wk  Substance Use Topics  . Alcohol use: Not Currently  . Drug use: No     Allergies   Patient has no known allergies.   Review of Systems As per HPI   Physical Exam Triage Vital Signs ED Triage Vitals [07/06/19 0808]  Enc Vitals Group     BP (!)  164/91     Pulse Rate 67     Resp 16     Temp (!) 97.5 F (36.4 C)     Temp Source Temporal     SpO2 95 %     Weight      Height      Head Circumference      Peak Flow      Pain Score      Pain Loc      Pain Edu?      Excl. in GC?    No data found.  Updated Vital Signs BP (!) 164/91 (BP Location: Left Arm)   Pulse 67   Temp (!) 97.5 F (36.4 C) (Temporal)   Resp 16   SpO2 95%   Visual Acuity Right Eye Distance:   Left Eye Distance:   Bilateral Distance:    Right Eye Near:   Left Eye Near:    Bilateral Near:     Physical Exam Constitutional:      General: He is not in acute distress.    Appearance: He is obese. He is not ill-appearing.  HENT:     Head: Normocephalic and atraumatic.  Eyes:     General: No scleral icterus.    Pupils: Pupils are equal, round, and reactive to light.  Cardiovascular:     Rate and Rhythm: Normal rate.  Pulmonary:     Effort: Pulmonary effort is normal. No respiratory distress.     Breath sounds: No wheezing.  Musculoskeletal:     Right shoulder: Normal.     Left shoulder: No swelling, deformity, effusion or crepitus. Normal range of motion. Normal strength. Normal pulse.     Cervical back: Normal range of motion. No tenderness.     Comments: Mild AC joint tenderness  Skin:    Coloration: Skin is not jaundiced or pale.  Neurological:     Mental Status: He is alert and oriented to person, place, and time.      UC Treatments / Results  Labs (all labs ordered are listed, but only abnormal results are displayed) Labs Reviewed - No data to display  EKG   Radiology No results found.  Procedures Procedures (including critical care time)  Medications Ordered in UC Medications - No data to display  Initial Impression / Assessment and Plan / UC Course  I have reviewed the triage vital signs and the nursing notes.  Pertinent labs & imaging results that were available during my care of the patient were reviewed by me and  considered in my medical decision making (see chart for details).     Patient afebrile, nontoxic in office today.  H&P concerning for musculoskeletal etiology, though patient is morbidly obese and does not routinely seek healthcare  perform health screenings.  EKG was done in office, reviewed by me and compared to previous from May 2019: NSR with ventricular rate 65 bpm.  No QTC prolongation, ST elevation.  Patient does have new T wave inversion in leads III, V6.  Nonacute/stable EKG.  Reviewed findings with patient verbalized understanding.  Will treat supportively as outlined below.  Return precautions discussed, patient verbalized understanding and is agreeable to plan. Final Clinical Impressions(s) / UC Diagnoses   Final diagnoses:  Acute pain of left shoulder     Discharge Instructions     Recommend RICE: rest, ice, compression, elevation as needed for pain.    Heat therapy (hot compress, warm wash red, hot showers, etc.) can help relax muscles and soothe muscle aches. Cold therapy (ice packs) can be used to help swelling both after injury and after prolonged use of areas of chronic pain/aches.  For pain: Take naproxen as directed.  May add Tylenol if needed. Return for worsening pain, swelling, numbness.    ED Prescriptions    Medication Sig Dispense Auth. Provider   naproxen (NAPROSYN) 500 MG tablet Take 1 tablet (500 mg total) by mouth 2 (two) times daily. 30 tablet Hall-Potvin, [place], PA-C     PDMP not reviewed this encounter.   Neldon Mc [place], [place] 07/06/19 [phone]

## 2019-07-06 NOTE — ED Notes (Signed)
Patient able to ambulate independently  

## 2019-07-06 NOTE — ED Triage Notes (Signed)
Pt presents to [facility] for assessment of left shoulder pain x 3 days with some finger "tingling".  Denies known injury.

## 2019-07-06 NOTE — Discharge Instructions (Addendum)
Recommend RICE: rest, ice, compression, elevation as needed for pain.    Heat therapy (hot compress, warm wash red, hot showers, etc.) can help relax muscles and soothe muscle aches. Cold therapy (ice packs) can be used to help swelling both after injury and after prolonged use of areas of chronic pain/aches.  For pain: Take naproxen as directed.  May add Tylenol if needed. Return for worsening pain, swelling, numbness.

## 2019-07-28 ENCOUNTER — Ambulatory Visit (HOSPITAL_COMMUNITY)
Admission: EM | Admit: 2019-07-28 | Discharge: 2019-07-28 | Disposition: A | Discharge status: Home / Self Care | Payer: Medicaid Other | Attending: Family Medicine | Admitting: Family Medicine

## 2019-07-28 ENCOUNTER — Other Ambulatory Visit: Payer: Self-pay

## 2019-07-28 ENCOUNTER — Encounter (HOSPITAL_COMMUNITY): Payer: Self-pay | Admitting: Emergency Medicine

## 2019-07-28 DIAGNOSIS — M792 Neuralgia and neuritis, unspecified: Secondary | ICD-10-CM | POA: Diagnosis not present

## 2019-07-28 MED ORDER — PREDNISONE 10 MG (21) PO TBPK: ORAL_TABLET | ORAL | 0 refills | Status: DC

## 2019-07-28 NOTE — Discharge Instructions (Addendum)
Take the medication as prescribed Follow-up with [facility] for any continued or worsening problems.

## 2019-07-28 NOTE — ED Provider Notes (Signed)
[place]    CSN: [id number] Arrival date & time: 07/28/19  7124      History   Chief Complaint Chief Complaint  Patient presents with  . Shoulder Pain  . Hand Pain    HPI Frank Howe is a 44 y.o. male.   Patient is a 44 year old male past medical history of gout, obesity, OSA.  He presents today with left shoulder pain,  numbness, tingling to the second and third fingers of the left hand.  He has had mild swelling to the left hand.  The shoulder pain has mostly improved.  He was taking naproxen for this.  Normal range of motion of the shoulder.  No swelling.  Denies any redness or increased warmth to the hand.  He has normal range of motion.  No injuries to the arm.  He does lay on this arm when sleeping.  Concerned due to abnormal sensation of hand that has persisted.  Denies any neck pain.   ROS per HPI      Past Medical History:  Diagnosis Date  . Elevated blood pressure reading without diagnosis of hypertension   . Gout   . Impaired fasting blood sugar   . Obesity   . OSA (obstructive sleep apnea)     Patient Active Problem List   Diagnosis Date Noted  . Umbilical hernia with obstruction but no gangrene 02/11/2018  . Partial small bowel obstruction ([facility]) 09/13/2017  . Gout, chronic 07/19/2015  . OSA (obstructive sleep apnea) 07/19/2015  . Obesity 06/29/2015  . Elevated uric acid in blood 06/29/2015  . Impaired fasting blood sugar 06/28/2015  . Knee swelling 06/28/2015  . Right knee pain 06/28/2015  . Elevated blood pressure reading without diagnosis of hypertension 06/28/2015  . Routine general medical examination at a health care facility 08/21/2011    Past Surgical History:  Procedure Laterality Date  . UMBILICAL HERNIA REPAIR N/A 09/14/2017   Procedure: LAPAROSCOPIC UMBILICAL HERNIA;  Surgeon: Leighton Ruff, MD;  Location: [facility];  Service: General;  Laterality: N/A;  . UMBILICAL HERNIA REPAIR N/A 02/11/2018   Procedure: LAPARASCOPIC REDO  REPAIR OF RECURRENT UMBILICAL HERNIA WITH INSERTION OF MESH, LYSIS OF ADHESIONS;  Surgeon: Ileana Roup, MD;  Location: [facility];  Service: General;  Laterality: N/A;       Home Medications    Prior to Admission medications   Medication Sig Start Date End Date Taking? Authorizing Provider  lansoprazole (PREVACID SOLUTAB) 30 MG disintegrating tablet Take 1 tablet (30 mg total) by mouth daily at 12 noon. 04/15/19   Argentina Donovan, PA-C  lisinopril-hydrochlorothiazide (ZESTORETIC) 10-12.5 MG tablet Take 1 tablet by mouth daily. 04/15/19   Argentina Donovan, PA-C  naproxen (NAPROSYN) 500 MG tablet Take 1 tablet (500 mg total) by mouth 2 (two) times daily. 07/06/19   Hall-Potvin, [place], PA-C  ondansetron (ZOFRAN ODT) 4 MG disintegrating tablet Take 1 tablet (4 mg total) by mouth every 8 (eight) hours as needed for nausea or vomiting. 03/23/19   Tasia Catchings, Amy V, PA-C  predniSONE (STERAPRED UNI-PAK 21 TAB) 10 MG (21) TBPK tablet 6 tabs for 1 day, then 5 tabs for 1 das, then 4 tabs for 1 day, then 3 tabs for 1 day, 2 tabs for 1 day, then 1 tab for 1 day 07/28/19   Orvan July, NP  PRESCRIPTION MEDICATION Gout medication, unknown name    [provider]    Family History Family History  Problem Relation Age of Onset  .  Arthritis Mother   . Hypertension Mother   . Stroke Mother   . Aneurysm Mother        died of brain aneurysm  . Asthma Father   . Cancer Father        ?  Marland Kitchen Arthritis Father   . Heart disease Maternal Uncle   . Diabetes Maternal Uncle   . Alcohol abuse Neg Hx   . Hyperlipidemia Neg Hx   . Hearing loss Neg Hx   . Kidney disease Neg Hx   . Early death Neg Hx     Social History Social History   Tobacco Use  . Smoking status: Former Smoker    Packs/day: 0.25    Years: 4.00    Pack years: 1.00    Types: Cigarettes    Quit date: 12/29/2014    Years since quitting: 4.5  . Smokeless tobacco: Never Used  . Tobacco comment: once every 2 wk  Substance Use  Topics  . Alcohol use: Not Currently  . Drug use: No     Allergies   Patient has no known allergies.   Review of Systems Review of Systems   Physical Exam Triage Vital Signs ED Triage Vitals [07/28/19 1021]  Enc Vitals Group     BP (!) 154/84     Pulse Rate 78     Resp 18     Temp 98.6 F (37 C)     Temp Source Oral     SpO2 95 %     Weight      Height      Head Circumference      Peak Flow      Pain Score 7     Pain Loc      Pain Edu?      Excl. in GC?    No data found.  Updated Vital Signs BP (!) 154/84 (BP Location: Right Arm)   Pulse 78   Temp 98.6 F (37 C) (Oral)   Resp 18   SpO2 95%   Visual Acuity Right Eye Distance:   Left Eye Distance:   Bilateral Distance:    Right Eye Near:   Left Eye Near:    Bilateral Near:     Physical Exam Vitals and nursing note reviewed.  Constitutional:      Appearance: Normal appearance.  HENT:     Head: Normocephalic and atraumatic.     Nose: Nose normal.  Eyes:     Conjunctiva/sclera: Conjunctivae normal.  Pulmonary:     Effort: Pulmonary effort is normal.  Musculoskeletal:        General: No swelling or tenderness. Normal range of motion.     Cervical back: Normal range of motion.     Comments: None tender to palpation of entire left shoulder with normal range of motion. Left hand appears normal without swelling, redness, bruising or deformities.  Decreased sensation to left second and third fingers. 2+ radial pulse with normal cap refill   Skin:    General: Skin is warm and dry.  Neurological:     Mental Status: He is alert.  Psychiatric:        Mood and Affect: Mood normal.      UC Treatments / Results  Labs (all labs ordered are listed, but only abnormal results are displayed) Labs Reviewed - No data to display  EKG   Radiology No results found.  Procedures Procedures (including critical care time)  Medications Ordered in UC Medications - No data  to display  Initial Impression  / Assessment and Plan / UC Course  I have reviewed the triage vital signs and the nursing notes.  Pertinent labs & imaging results that were available during my care of the patient were reviewed by me and considered in my medical decision making (see chart for details).     Nerve pain/inflammation- most likely dx based on symptoms. Nothing concerning on exam.  Treating with prednisone taper.  We will have him follow-up with [facility]. Final Clinical Impressions(s) / UC Diagnoses   Final diagnoses:  Nerve pain     Discharge Instructions     Take the medication as prescribed Follow-up with [facility] for any continued or worsening problems.    ED Prescriptions    Medication Sig Dispense Auth. Provider   predniSONE (STERAPRED UNI-PAK 21 TAB) 10 MG (21) TBPK tablet 6 tabs for 1 day, then 5 tabs for 1 das, then 4 tabs for 1 day, then 3 tabs for 1 day, 2 tabs for 1 day, then 1 tab for 1 day 21 tablet Jahniya Duzan A, NP     PDMP not reviewed this encounter.   Dahlia Byes A, NP 07/29/19 [phone]

## 2019-07-28 NOTE — ED Triage Notes (Signed)
Pt sts left shoulder pain intermittently that has improved some since his last visit for same; pt sts having some pain and numbness in first two fingers on left hand; pt sts worse after waking from sleep

## 2019-09-10 ENCOUNTER — Other Ambulatory Visit: Payer: Self-pay

## 2019-09-10 ENCOUNTER — Encounter: Payer: Self-pay | Admitting: Sports Medicine

## 2019-09-10 ENCOUNTER — Ambulatory Visit: Payer: Medicaid Other | Admitting: Sports Medicine

## 2019-09-10 VITALS — BP 161/89 | Ht 68.0 in | Wt 308.0 lb

## 2019-09-10 DIAGNOSIS — G5602 Carpal tunnel syndrome, left upper limb: Secondary | ICD-10-CM | POA: Diagnosis present
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
## 2019-09-10 NOTE — Patient Instructions (Signed)
The numbness and tingling in your fingers is caused by carpal tunnel syndrome.  This is where the median nerve gets pinched in the wrist. -Wear the wrist brace I have given you at nighttime.  He may also wear this as much as you are able during the day.  This will help keep your wrist in neutral position and will help prevent the nerve from being pinched -As the steroids did not help you in the past I do not think doing another round of oral steroids would be beneficial.  You may take over-the-counter anti-inflammatories as needed for any pain  I would like to see you back in 1 month to reevaluate you.  If you have any questions or concerns in the meantime please not hesitate to contact my office.

## 2019-10-08 ENCOUNTER — Other Ambulatory Visit: Payer: Self-pay

## 2019-10-08 ENCOUNTER — Ambulatory Visit: Payer: Medicaid Other | Admitting: Sports Medicine

## 2019-10-08 ENCOUNTER — Encounter: Payer: Self-pay | Admitting: Sports Medicine

## 2019-10-08 VITALS — BP 149/81 | Ht 68.0 in | Wt 308.0 lb

## 2019-10-08 DIAGNOSIS — G5602 Carpal tunnel syndrome, left upper limb: Secondary | ICD-10-CM

## 2019-10-08 MED ORDER — MELOXICAM 15 MG PO TABS: ORAL_TABLET | ORAL | 2 refills | Status: DC
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
## 2019-10-08 NOTE — Patient Instructions (Signed)
The pain in your wrist is caused by carpal tunnel syndrome.  The next steps in treatment are doing a steroid injection into the carpal tunnel.  We can schedule this for next Friday as this is what works best in your schedule.  If you change your mind and would like it done sooner please do not hesitate to call us. -I have sent in a prescription for meloxicam for you.  Do not take this with other anti-inflammatories such as Aleve or ibuprofen -Continue wearing the wrist brace at night  I will see back in 1 week for the steroid injection

## 2019-10-17 ENCOUNTER — Other Ambulatory Visit: Payer: Self-pay

## 2019-10-17 ENCOUNTER — Ambulatory Visit: Payer: Medicaid Other | Admitting: Sports Medicine

## 2019-10-17 ENCOUNTER — Encounter: Payer: Self-pay | Admitting: Sports Medicine

## 2019-10-17 VITALS — BP 154/80 | Ht 68.0 in | Wt 308.0 lb

## 2019-10-17 DIAGNOSIS — G5602 Carpal tunnel syndrome, left upper limb: Secondary | ICD-10-CM | POA: Diagnosis not present

## 2019-10-17 MED ORDER — METHYLPREDNISOLONE ACETATE 40 MG/ML IJ SUSP
40.0000 mg | Freq: Once | INTRAMUSCULAR | Status: AC
  Administered 2019-10-17: 40 mg via INTRA_ARTICULAR
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
## 2019-10-17 NOTE — Addendum Note (Signed)
Addended by: Ivonne Andrew on: 10/17/2019 09:37 AM   Modules accepted: Orders

## 2019-10-17 NOTE — Patient Instructions (Signed)
For your carpal tunnel syndrome we did a corticosteroid injection of the carpal tunnel today.  For this injection, I did 2 different medicines.  The first medicine is lidocaine which is a numbing medicine.  The second medicine is the cortisone which is the steroid.  The lidocaine will wear off later today and your symptoms may return later this afternoon.  The cortisone take several days before it starts to work.  Do not be alarmed if your symptoms do not start to improve until early next week.  If this completely resolves your symptoms you do not need additional follow-up.  If your symptoms persist, please come back to see [place] in 1 month and we can discuss getting nerve conduction studies to confirm the diagnosis.

## 2019-11-07 ENCOUNTER — Telehealth: Payer: Self-pay | Admitting: General Practice

## 2019-11-07 NOTE — Telephone Encounter (Unsigned)
Copied from CRM [id number]. Topic: General - Other >> Nov 06, 2019  4:06 PM Wyonia Hough E wrote: Reason for CRM: [facility] called and wanted to confirm receipt of a form faxed to office for Cpap supply script / faxed to office on [phone]/ they are trying to replace cpap supplies for Pt /please advise

## 2019-12-31 ENCOUNTER — Other Ambulatory Visit: Payer: Medicaid Other

## 2020-02-04 ENCOUNTER — Emergency Department (HOSPITAL_COMMUNITY)
Admission: EM | Admit: 2020-02-04 | Discharge: 2020-02-04 | Disposition: A | Discharge status: Home / Self Care | Payer: Medicaid Other | Attending: Emergency Medicine | Admitting: Emergency Medicine

## 2020-02-04 ENCOUNTER — Other Ambulatory Visit: Payer: Self-pay

## 2020-02-04 ENCOUNTER — Encounter (HOSPITAL_COMMUNITY): Payer: Self-pay | Admitting: Emergency Medicine

## 2020-02-04 DIAGNOSIS — Z8719 Personal history of other diseases of the digestive system: Secondary | ICD-10-CM | POA: Insufficient documentation

## 2020-02-04 DIAGNOSIS — R112 Nausea with vomiting, unspecified: Secondary | ICD-10-CM | POA: Diagnosis not present

## 2020-02-04 DIAGNOSIS — Z79899 Other long term (current) drug therapy: Secondary | ICD-10-CM | POA: Diagnosis not present

## 2020-02-04 DIAGNOSIS — R197 Diarrhea, unspecified: Secondary | ICD-10-CM | POA: Diagnosis not present

## 2020-02-04 DIAGNOSIS — Z87891 Personal history of nicotine dependence: Secondary | ICD-10-CM | POA: Diagnosis not present

## 2020-02-04 LAB — COMPREHENSIVE METABOLIC PANEL
ALT: 31 U/L (ref 0–44)
AST: 26 U/L (ref 15–41)
Albumin: 3.6 g/dL (ref 3.5–5.0)
Alkaline Phosphatase: 95 U/L (ref 38–126)
Anion gap: 9 (ref 5–15)
BUN: 12 mg/dL (ref 6–20)
CO2: 24 mmol/L (ref 22–32)
Calcium: 8.6 mg/dL — ABNORMAL LOW (ref 8.9–10.3)
Chloride: 105 mmol/L (ref 98–111)
Creatinine, Ser: 1.21 mg/dL (ref 0.61–1.24)
GFR, Estimated: >60 mL/min (ref >60)
Glucose, Bld: 136 mg/dL — ABNORMAL HIGH (ref 70–99)
Potassium: 3.8 mmol/L (ref 3.5–5.1)
Sodium: 138 mmol/L (ref 135–145)
Total Bilirubin: 0.7 mg/dL (ref 0.3–1.2)
Total Protein: 7.7 g/dL (ref 6.5–8.1)

## 2020-02-04 LAB — LIPASE, BLOOD: Lipase: 24 U/L (ref 11–51)

## 2020-02-04 LAB — CBC
HCT: 42 % (ref 39.0–52.0)
Hemoglobin: 13.1 g/dL (ref 13.0–17.0)
MCH: 27.3 pg (ref 26.0–34.0)
MCHC: 31.2 g/dL (ref 30.0–36.0)
MCV: 87.5 fL (ref 80.0–100.0)
Platelets: 334 10*3/uL (ref 150–400)
RBC: 4.8 MIL/uL (ref 4.22–5.81)
RDW: 13.9 % (ref 11.5–15.5)
WBC: 11.5 10*3/uL — ABNORMAL HIGH (ref 4.0–10.5)
nRBC: 0 % (ref 0.0–0.2)

## 2020-02-04 MED ORDER — DICYCLOMINE HCL 20 MG PO TABS: 20.0000 mg | ORAL_TABLET | Freq: Two times a day (BID) | ORAL | 0 refills | Status: DC

## 2020-02-04 MED ORDER — ONDANSETRON 4 MG PO TBDP: 4.0000 mg | ORAL_TABLET | Freq: Three times a day (TID) | ORAL | 0 refills | Status: DC | PRN

## 2020-02-04 MED ORDER — ONDANSETRON 8 MG PO TBDP
8.0000 mg | ORAL_TABLET | Freq: Once | ORAL | Status: AC
  Administered 2020-02-04: 8 mg via ORAL
  Filled 2020-02-04: qty 1

## 2020-02-04 NOTE — Discharge Instructions (Signed)
Please use Zofran as needed for nausea.  Please use Tylenol 1000 mg as needed for pain.  I have also prescribed you Bentyl which she may use for cramping abdominal pain.  You may return to the ER for any new or concerning symptoms.  Drink plenty of water and eat bland foods such as bananas, applesauce, rice, toast.

## 2020-02-04 NOTE — ED Triage Notes (Signed)
Patient stated he had bowel sx about a year ago and they told him if he has emesis to come to the hospital. He had several episodes of gagging and nausea, no emesis today. Endorses x1 episode of diarrhea.

## 2020-02-04 NOTE — ED Provider Notes (Signed)
[facility]-EMERGENCY DEPT Provider Note   CSN: [id number] Arrival date & time: 02/04/20  1526     History No chief complaint on file.   Frank Howe is a 44 y.o. male.  HPI Patient is a 44 year old male with past medical history of, umbilical hernia, partial SBO, he has had 2 surgeries done in the past for his umbilical hernia.  Patient presents today with 3-4 episodes of gagging and small episodes of vomiting. Nonbloody nonbilious.  He states he had 1 episode of some loose stool earlier today.  He is passing flatus and states he has some gurgling sounds in his abdomen.  He denies any abdominal pain.  He does have some discomfort around his hernia which he states is not new.  He is able to reduce his hernia without any difficulty that has not changed today.  No fevers or chills.  No melena or hematochezia.  No evidence dizziness chest pain shortness of breath.  No recent travel or antibiotic use.  No history of C. difficile.     Past Medical History:  Diagnosis Date  . Elevated blood pressure reading without diagnosis of hypertension   . Gout   . Impaired fasting blood sugar   . Obesity   . OSA (obstructive sleep apnea)     Patient Active Problem List   Diagnosis Date Noted  . Umbilical hernia with obstruction but no gangrene 02/11/2018  . Partial small bowel obstruction (HCC) 09/13/2017  . Gout, chronic 07/19/2015  . OSA (obstructive sleep apnea) 07/19/2015  . Obesity 06/29/2015  . Elevated uric acid in blood 06/29/2015  . Impaired fasting blood sugar 06/28/2015  . Knee swelling 06/28/2015  . Right knee pain 06/28/2015  . Elevated blood pressure reading without diagnosis of hypertension 06/28/2015  . Routine general medical examination at a health care facility 08/21/2011    Past Surgical History:  Procedure Laterality Date  . UMBILICAL HERNIA REPAIR N/A 09/14/2017   Procedure: LAPAROSCOPIC UMBILICAL HERNIA;  Surgeon: Romie Levee, MD;   Location: [facility];  Service: General;  Laterality: N/A;  . UMBILICAL HERNIA REPAIR N/A 02/11/2018   Procedure: LAPARASCOPIC REDO REPAIR OF RECURRENT UMBILICAL HERNIA WITH INSERTION OF MESH, LYSIS OF ADHESIONS;  Surgeon: Andria Meuse, MD;  Location: [facility];  Service: General;  Laterality: N/A;       Family History  Problem Relation Age of Onset  . Arthritis Mother   . Hypertension Mother   . Stroke Mother   . Aneurysm Mother        died of brain aneurysm  . Asthma Father   . Cancer Father        ?  Marland Kitchen Arthritis Father   . Heart disease Maternal Uncle   . Diabetes Maternal Uncle   . Alcohol abuse Neg Hx   . Hyperlipidemia Neg Hx   . Hearing loss Neg Hx   . Kidney disease Neg Hx   . Early death Neg Hx     Social History   Tobacco Use  . Smoking status: Former Smoker    Packs/day: 0.25    Years: 4.00    Pack years: 1.00    Types: Cigarettes    Quit date: 12/29/2014    Years since quitting: 5.1  . Smokeless tobacco: Never Used  . Tobacco comment: once every 2 wk  Vaping Use  . Vaping Use: Never used  Substance Use Topics  . Alcohol use: Not Currently  . Drug use: No    Home  Medications Prior to Admission medications   Medication Sig Start Date End Date Taking? Authorizing Provider  dicyclomine (BENTYL) 20 MG tablet Take 1 tablet (20 mg total) by mouth 2 (two) times daily. 02/04/20   Gailen Shelter, PA  lansoprazole (PREVACID SOLUTAB) 30 MG disintegrating tablet Take 1 tablet (30 mg total) by mouth daily at 12 noon. 04/15/19   Anders Simmonds, PA-C  lisinopril-hydrochlorothiazide (ZESTORETIC) 10-12.5 MG tablet Take 1 tablet by mouth daily. 04/15/19   Anders Simmonds, PA-C  meloxicam (MOBIC) 15 MG tablet Take one pill a day with food for 7 days and then prn thereafter 10/08/19   Christena Deem, MD  naproxen (NAPROSYN) 500 MG tablet Take 1 tablet (500 mg total) by mouth 2 (two) times daily. 07/06/19   Hall-Potvin, [place], PA-C  ondansetron (ZOFRAN ODT) 4 MG  disintegrating tablet Take 1 tablet (4 mg total) by mouth every 8 (eight) hours as needed for nausea or vomiting. 02/04/20   Gailen Shelter, PA  predniSONE (STERAPRED UNI-PAK 21 TAB) 10 MG (21) TBPK tablet 6 tabs for 1 day, then 5 tabs for 1 das, then 4 tabs for 1 day, then 3 tabs for 1 day, 2 tabs for 1 day, then 1 tab for 1 day 07/28/19   Janace Aris, NP  PRESCRIPTION MEDICATION Gout medication, unknown name    [provider]    Allergies    Patient has no known allergies.  Review of Systems   Review of Systems  Constitutional: Negative for chills and fever.  HENT: Negative for congestion.   Eyes: Negative for pain.  Respiratory: Negative for cough and shortness of breath.   Cardiovascular: Negative for chest pain and leg swelling.  Gastrointestinal: Positive for diarrhea, nausea and vomiting. Negative for abdominal pain.  Genitourinary: Negative for dysuria.  Musculoskeletal: Negative for myalgias.  Skin: Negative for rash.  Neurological: Negative for dizziness and headaches.    Physical Exam Updated Vital Signs BP (!) 165/101 (BP Location: Left Arm)   Pulse 97   Temp 98 F (36.7 C) (Oral)   Resp 19   Ht 5\' 8"  (1.727 m)   Wt (!) 142.9 kg   SpO2 96%   BMI 47.90 kg/m   Physical Exam Vitals and nursing note reviewed.  Constitutional:      General: He is not in acute distress.    Comments: Pleasant well-appearing 44 year old male in no acute stress and comfortably in bed.  HENT:     Head: Normocephalic and atraumatic.     Nose: Nose normal.     Mouth/Throat:     Mouth: Mucous membranes are moist.  Eyes:     General: No scleral icterus. Cardiovascular:     Rate and Rhythm: Normal rate and regular rhythm.     Pulses: Normal pulses.     Heart sounds: Normal heart sounds.  Pulmonary:     Effort: Pulmonary effort is normal. No respiratory distress.     Breath sounds: No wheezing.  Abdominal:     Palpations: Abdomen is soft.     Tenderness: There is no  abdominal tenderness. There is no guarding or rebound.     Comments: Protuberant soft abdomen with no tenderness to palpation.  No guarding or rebound.  No CVA tenderness.  Musculoskeletal:     Cervical back: Normal range of motion.     Right lower leg: No edema.     Left lower leg: No edema.  Skin:    General: Skin is warm  and dry.     Capillary Refill: Capillary refill takes less than 2 seconds.  Neurological:     Mental Status: He is alert. Mental status is at baseline.  Psychiatric:        Mood and Affect: Mood normal.        Behavior: Behavior normal.     ED Results / Procedures / Treatments   Labs (all labs ordered are listed, but only abnormal results are displayed) Labs Reviewed  COMPREHENSIVE METABOLIC PANEL - Abnormal; Notable for the following components:      Result Value   Glucose, Bld 136 (*)    Calcium 8.6 (*)    All other components within normal limits  CBC - Abnormal; Notable for the following components:   WBC 11.5 (*)    All other components within normal limits  LIPASE, BLOOD  URINALYSIS, ROUTINE W REFLEX MICROSCOPIC    EKG None  Radiology No results found.  Procedures Procedures (including critical care time)  Medications Ordered in ED Medications  ondansetron (ZOFRAN-ODT) disintegrating tablet 8 mg (has no administration in time range)    ED Course  I have reviewed the triage vital signs and the nursing notes.  Pertinent labs & imaging results that were available during my care of the patient were reviewed by me and considered in my medical decision making (see chart for details).    MDM Rules/Calculators/A&P                          Patient is 44 year old male with history of recurrent hernias s/p 2 surgeries.  No other abdominal surgical history.  Does have history of 1 partial SBO in the past.  Presented today for 1 episode of loose stool this morning and several episodes of gagging and vomiting.  He states he longer feels nauseous.   Has no symptoms currently.  Physical exam is notable for absolutely no abdominal tenderness.  No guarding or rebound.  No CVA tenderness.  He denies any symptoms.  CMP is without any electrolyte abnormalities present very mild hypocalcemia.  Lipase within normal limits doubt pancreatitis.  CBC without any significant leukocytosis mild elevation of white blood cells likely secondary to several episodes of vomiting.  Low suspicion for SBO, DKA, LBO.   Given patient has abdominal tenderness will provide patient with 1 dose of Zofran and p.o. challenge.  Patient successfully p.o. challenge.  He is comfortable discharge at this time.  Given strict return precautions.  Very low suspicion for SBO.  Offered additional work-up however patient is comfortable with discharge this time given that he continues to be symptom-free.  He also has a doctor's appointment tomorrow and states that he will keep the appointment and be reevaluated then.  Final Clinical Impression(s) / ED Diagnoses Final diagnoses:  Nausea vomiting and diarrhea    Rx / DC Orders ED Discharge Orders         Ordered    ondansetron (ZOFRAN ODT) 4 MG disintegrating tablet  Every 8 hours PRN        02/04/20 1828    dicyclomine (BENTYL) 20 MG tablet  2 times daily        02/04/20 1829           Solon Augusta [place], [place] 02/04/20 Berna Spare    Bethann Berkshire, MD 02/06/20 1104

## 2020-07-20 ENCOUNTER — Other Ambulatory Visit: Payer: Self-pay

## 2020-07-20 ENCOUNTER — Ambulatory Visit (INDEPENDENT_AMBULATORY_CARE_PROVIDER_SITE_OTHER): Payer: Medicaid Other | Admitting: Pulmonary Disease

## 2020-07-20 ENCOUNTER — Encounter: Payer: Self-pay | Admitting: Pulmonary Disease

## 2020-07-20 VITALS — BP 142/90 | HR 98 | Temp 97.3°F | Ht 68.0 in | Wt 318.4 lb

## 2020-07-20 DIAGNOSIS — G4733 Obstructive sleep apnea (adult) (pediatric): Secondary | ICD-10-CM
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
## 2020-07-20 NOTE — Patient Instructions (Signed)
Schedule in lab split-night study  Continue weight loss efforts  I will see you in about 3 months Call with significant concerns  Sleep Apnea Sleep apnea is a condition in which breathing pauses or becomes shallow during sleep. Episodes of sleep apnea usually last 10 seconds or longer, and they may occur as many as 20 times an hour. Sleep apnea disrupts your sleep and keeps your body from getting the rest that it needs. This condition can increase your risk of certain health problems, including:  Heart attack.  Stroke.  Obesity.  Diabetes.  Heart failure.  Irregular heartbeat. What are the causes? There are three kinds of sleep apnea:  Obstructive sleep apnea. This kind is caused by a blocked or collapsed airway.  Central sleep apnea. This kind happens when the part of the brain that controls breathing does not send the correct signals to the muscles that control breathing.  Mixed sleep apnea. This is a combination of obstructive and central sleep apnea. The most common cause of this condition is a collapsed or blocked airway. An airway can collapse or become blocked if:  Your throat muscles are abnormally relaxed.  Your tongue and tonsils are larger than normal.  You are overweight.  Your airway is smaller than normal.   What increases the risk? You are more likely to develop this condition if you:  Are overweight.  Smoke.  Have a smaller than normal airway.  Are elderly.  Are male.  Drink alcohol.  Take sedatives or tranquilizers.  Have a family history of sleep apnea. What are the signs or symptoms? Symptoms of this condition include:  Trouble staying asleep.  Daytime sleepiness and tiredness.  Irritability.  Loud snoring.  Morning headaches.  Trouble concentrating.  Forgetfulness.  Decreased interest in sex.  Unexplained sleepiness.  Mood swings.  Personality changes.  Feelings of depression.  Waking up often during the night to  urinate.  Dry mouth.  Sore throat. How is this diagnosed? This condition may be diagnosed with:  A medical history.  A physical exam.  A series of tests that are done while you are sleeping (sleep study). These tests are usually done in a sleep lab, but they may also be done at home. How is this treated? Treatment for this condition aims to restore normal breathing and to ease symptoms during sleep. It may involve managing health issues that can affect breathing, such as high blood pressure or obesity. Treatment may include:  Sleeping on your side.  Using a decongestant if you have nasal congestion.  Avoiding the use of depressants, including alcohol, sedatives, and narcotics.  Losing weight if you are overweight.  Making changes to your diet.  Quitting smoking.  Using a device to open your airway while you sleep, such as: ? An oral appliance. This is a custom-made mouthpiece that shifts your lower jaw forward. ? A continuous positive airway pressure (CPAP) device. This device blows air through a mask when you breathe out (exhale). ? A nasal expiratory positive airway pressure (EPAP) device. This device has valves that you put into each nostril. ? A bi-level positive airway pressure (BPAP) device. This device blows air through a mask when you breathe in (inhale) and breathe out (exhale).  Having surgery if other treatments do not work. During surgery, excess tissue is removed to create a wider airway. It is important to get treatment for sleep apnea. Without treatment, this condition can lead to:  High blood pressure.  Coronary artery disease.  In men, an inability to achieve or maintain an erection (impotence).  Reduced thinking abilities.   Follow these instructions at home: Lifestyle  Make any lifestyle changes that your health care provider recommends.  Eat a healthy, well-balanced diet.  Take steps to lose weight if you are overweight.  Avoid using  depressants, including alcohol, sedatives, and narcotics.  Do not use any products that contain nicotine or tobacco, such as cigarettes, e-cigarettes, and chewing tobacco. If you need help quitting, ask your health care provider. General instructions  Take over-the-counter and prescription medicines only as told by your health care provider.  If you were given a device to open your airway while you sleep, use it only as told by your health care provider.  If you are having surgery, make sure to tell your health care provider you have sleep apnea. You may need to bring your device with you.  Keep all follow-up visits as told by your health care provider. This is important. Contact a health care provider if:  The device that you received to open your airway during sleep is uncomfortable or does not seem to be working.  Your symptoms do not improve.  Your symptoms get worse. Get help right away if:  You develop: ? Chest pain. ? Shortness of breath. ? Discomfort in your back, arms, or stomach.  You have: ? Trouble speaking. ? Weakness on one side of your body. ? Drooping in your face. These symptoms may represent a serious problem that is an emergency. Do not wait to see if the symptoms will go away. Get medical help right away. Call your local emergency services (911 in the [place] Do not drive yourself to the hospital. Summary  Sleep apnea is a condition in which breathing pauses or becomes shallow during sleep.  The most common cause is a collapsed or blocked airway.  The goal of treatment is to restore normal breathing and to ease symptoms during sleep. This information is not intended to replace advice given to you by your health care provider. Make sure you discuss any questions you have with your health care provider. Document Revised: 09/25/2018 Document Reviewed: 12/04/2017 Elsevier Patient Education  2021 [employer].

## 2020-09-01 ENCOUNTER — Other Ambulatory Visit: Payer: Self-pay

## 2020-09-01 ENCOUNTER — Ambulatory Visit (HOSPITAL_BASED_OUTPATIENT_CLINIC_OR_DEPARTMENT_OTHER): Payer: Medicaid Other | Attending: Pulmonary Disease | Admitting: Pulmonary Disease

## 2020-09-01 DIAGNOSIS — G4733 Obstructive sleep apnea (adult) (pediatric): Secondary | ICD-10-CM

## 2020-09-08 ENCOUNTER — Telehealth: Payer: Self-pay | Admitting: Pulmonary Disease

## 2020-09-08 DIAGNOSIS — G4733 Obstructive sleep apnea (adult) (pediatric): Secondary | ICD-10-CM

## 2020-09-08 NOTE — Telephone Encounter (Signed)
I have called and spoke with patient regarding HST results and Dr. Beverley Fiedler. Patient verbalized understanding and is agreeable to BIPAP. I sent in the order for the BiPAP and mask and informed the patient the DME will be reaching out next and about the delay due to the recall. I also informed patient to call the office when they get the machine and make an 4-6 OV. Patient verbalized understanding, nothing further needed.

## 2020-09-08 NOTE — Telephone Encounter (Signed)
Call patient  Sleep study result  Date of study: 09/01/2020  Impression: Severe obstructive sleep apnea  Recommendation: DME referral  Trial of BiPAP therapy on 24/20 cm H2O with a Medium size Fisher[employer]Paykel Full Face Mask Simplus mask and heated humidification.  Encourage weight loss measures  Follow-up in the office 4 to 6 weeks following initiation of treatment

## 2020-09-08 NOTE — Procedures (Signed)
POLYSOMNOGRAPHY  Last, First: Arthuro, Canelo MRN: [id number] Gender: Male Age (years): 45 Weight (lbs): 319 DOB: 1975-07-15 BMI: 48 Primary Care: No PCP Epworth Score: 21 Referring: Tomma Lightning MD Technician: Ulyess Mort Interpreting: Tomma Lightning MD Study Type: Split Night BiPAP Ordered Study Type: Split Night CPAP Study date: 09/01/2020 Location: [place] CLINICAL INFORMATION Frank Howe is a 45 year old Male and was referred to the sleep center for evaluation of G47.33 OSA: Adult and Pediatric (327.23). Indications include Diabetes, Excessive Daytime Sleepiness, Fatigue, Hypertension, Obesity, OSA, Snoring.   Most recent polysomnogram dated 03/09/2019 revealed an AHI of 119.5/h and RDI of 126.9/h. Most recent titration study dated 03/09/2019 was optimal at 14cm H2O with an AHI of 3.0/h. MEDICATIONS Patient self administered medications include: N/A. Medications administered during study include No sleep medicine administered.  SLEEP STUDY TECHNIQUE The patient underwent an attended overnight level one polysomnography titration to assess the effects of BIPAP therapy. The following variables were monitored: EEG (C4-A1, C3-A2, O1-A2, O2-A1), EOG, submental and leg EMG, ECG, oxyhemoglobin saturation by pulse oximetry, thoracic and abdominal respiratory effort belts, nasal/oral airflow by pressure sensor, body position sensor and snoring sensor. BIPAP pressure was titrated to eliminate apneas, hypopneas and oxygen desaturation.Hypopneas were scored per AASM definition IB (4% desaturation)  The NPSG portion of the study ended at 1:32:09 AM. The BiPAP titration was initiated at 1:35:03 AM AM with the BIPAP portion of the study ending at 5:30:20 AM.  TECHNICIAN COMMENTS Comments added by Technician: PATIENT WAS ORDERED AS A SPLIT NIGHT STUDY Comments added by Scorer: N/A SLEEP ARCHITECTURE The recording time for the entire night was 373.5 minutes.  The diagnostic  portion was initiated at 11:16:52 PM and terminated at 1:32:09 AM. The time in bed was 135.3 minutes. EEG confirmed total sleep time was 125.6 minutes yielding a sleep efficiency of 92.8%%. Sleep onset after lights out was 1.7 minutes with a REM latency of 73.5 minutes. The patient spent 68.5%% of the night in stage N1 sleep, 30.3% % in stage N2 sleep, 0.0%% in stage N3 and 1.2% in REM. The Arousal Index was 118.5/hour.  The titration portion was initiated at 1:35:03 AM and terminated at 5:30:20 AM. The time in bed was 235.3 minutes. EEG confirmed total sleep time was 224.0 minutes yielding a sleep efficiency of 95.2%%. Sleep onset after BiPAP initiation was 3.0 minutes with a REM latency of 34.5 minutes. The patient spent 12.1%% of the night in stage N1 sleep, 53.1%% in stage N2 sleep, 0.0%% in stage N3 and 34.8% in REM. The Arousal Index was 28.1/hour. RESPIRATORY PARAMETERS During the diagnostic portion, there were a total of 266 respiratory disturbances recorded; 187 apneas ( 187 obstructive, 0 mixed, 0 central), 79 hypopneas and 0 RERAs. The apnea/hypopnea index 127.1 was events/hour and the RDI was 127.1 events/hour. The central sleep apnea index was 0.0 events/hour. The REM AHI was 120.0/h and NREM AHI was 127.2/h. The REM RDI was 120.0/h and NREM RDI was 127.2/h. The supine AHI was 127.1/h, and the non supine AHI was 0/h; supine during 100.0%% of sleep. The supine RDI was 127.1/h, and the non supine RDI was N/A/h. Respiratory disturbances were associated with oxygen desaturation down to a nadir of 69.0 % during sleep. The mean oxygen saturation during the study was 84.7%. The cumulative time under 88% oxygen saturation was 84.4 minutes.  During the titration portion, the apnea/hypopnea index (AHI) was 41.3 events/hour and the RDI was 41.3 events/hour. The central sleep apnea index was events/hour.  The most appropriate setting of BiPAP was IPAP/EPAP 24/20 cm H2O. At this setting, the sleep efficiency  was 99% and the patient was supine for 100%. The AHI was 0 events per hour(with 0 central events). Oxygen nadir was 92.0 . LEG MOVEMENT DATA The periodic limb movement index was 0.0/hour with an associated arousal index of /hour. CARDIAC DATA The underlying cardiac rhythm was most consistent with sinus rhythm. Mean heart rate was 79.2 during diagnostic portion and 76.5 during titration portion of study. Additional rhythm abnormalities include None.  IMPRESSIONS EKG showed no cardiac abnormalities. The patient snored with loud snoring volume. Severe Obstructive Sleep apnea(OSA) Optimal pressure attained. Severe Oxygen Desaturation No significant periodic leg movements(PLMs) during sleep. However, no significant associated arousals. Normal sleep efficiency, short primary sleep latency, short REM sleep latency and no slow wave latency.  DIAGNOSIS Obstructive Sleep Apnea (G47.33)  RECOMMENDATIONS Trial of BiPAP therapy on 24/20 cm H2O with a Medium size Fisher[employer]Paykel Full Face Mask Simplus mask and heated humidification. Avoid alcohol, sedatives and other CNS depressants that may worsen sleep apnea and disrupt normal sleep architecture. Sleep hygiene should be reviewed to assess factors that may improve sleep quality. Weight management and regular exercise should be initiated or continued. Return to Sleep Center for re-evaluation.  [Electronically signed] 09/08/2020 01:28 PM  Virl Diamond MD NPI: [id number]

## 2020-09-11 ENCOUNTER — Encounter (HOSPITAL_BASED_OUTPATIENT_CLINIC_OR_DEPARTMENT_OTHER): Payer: Medicaid Other | Admitting: Pulmonary Disease

## 2020-09-26 ENCOUNTER — Encounter (HOSPITAL_BASED_OUTPATIENT_CLINIC_OR_DEPARTMENT_OTHER): Payer: Medicaid Other | Admitting: Pulmonary Disease

## 2021-01-14 ENCOUNTER — Other Ambulatory Visit: Payer: Self-pay

## 2021-01-14 ENCOUNTER — Emergency Department (HOSPITAL_COMMUNITY): Payer: Medicaid Other

## 2021-01-14 ENCOUNTER — Encounter (HOSPITAL_COMMUNITY): Payer: Self-pay

## 2021-01-14 ENCOUNTER — Inpatient Hospital Stay (HOSPITAL_COMMUNITY)
Admission: EM | Admit: 2021-01-14 | Discharge: 2021-01-17 | DRG: 394 | Disposition: A | Discharge status: Home / Self Care | Payer: Medicaid Other | Attending: Internal Medicine | Admitting: Internal Medicine

## 2021-01-14 DIAGNOSIS — E66813 Obesity, class 3: Secondary | ICD-10-CM

## 2021-01-14 DIAGNOSIS — Z79899 Other long term (current) drug therapy: Secondary | ICD-10-CM

## 2021-01-14 DIAGNOSIS — Z791 Long term (current) use of non-steroidal anti-inflammatories (NSAID): Secondary | ICD-10-CM

## 2021-01-14 DIAGNOSIS — Z8249 Family history of ischemic heart disease and other diseases of the circulatory system: Secondary | ICD-10-CM

## 2021-01-14 DIAGNOSIS — D72829 Elevated white blood cell count, unspecified: Secondary | ICD-10-CM | POA: Diagnosis present

## 2021-01-14 DIAGNOSIS — Z20822 Contact with and (suspected) exposure to covid-19: Secondary | ICD-10-CM | POA: Diagnosis present

## 2021-01-14 DIAGNOSIS — M109 Gout, unspecified: Secondary | ICD-10-CM | POA: Diagnosis present

## 2021-01-14 DIAGNOSIS — E1169 Type 2 diabetes mellitus with other specified complication: Secondary | ICD-10-CM

## 2021-01-14 DIAGNOSIS — K56609 Unspecified intestinal obstruction, unspecified as to partial versus complete obstruction: Secondary | ICD-10-CM | POA: Diagnosis present

## 2021-01-14 DIAGNOSIS — Z6841 Body Mass Index (BMI) 40.0 and over, adult: Secondary | ICD-10-CM

## 2021-01-14 DIAGNOSIS — E669 Obesity, unspecified: Secondary | ICD-10-CM

## 2021-01-14 DIAGNOSIS — K420 Umbilical hernia with obstruction, without gangrene: Principal | ICD-10-CM

## 2021-01-14 DIAGNOSIS — Z87891 Personal history of nicotine dependence: Secondary | ICD-10-CM

## 2021-01-14 DIAGNOSIS — Z833 Family history of diabetes mellitus: Secondary | ICD-10-CM

## 2021-01-14 DIAGNOSIS — G4733 Obstructive sleep apnea (adult) (pediatric): Secondary | ICD-10-CM | POA: Diagnosis present

## 2021-01-14 DIAGNOSIS — I10 Essential (primary) hypertension: Secondary | ICD-10-CM

## 2021-01-14 DIAGNOSIS — E119 Type 2 diabetes mellitus without complications: Secondary | ICD-10-CM | POA: Diagnosis present

## 2021-01-14 DIAGNOSIS — Z7984 Long term (current) use of oral hypoglycemic drugs: Secondary | ICD-10-CM

## 2021-01-14 LAB — CBC
HCT: 41 % (ref 39.0–52.0)
Hemoglobin: 12.7 g/dL — ABNORMAL LOW (ref 13.0–17.0)
MCH: 26.4 pg (ref 26.0–34.0)
MCHC: 31 g/dL (ref 30.0–36.0)
MCV: 85.2 fL (ref 80.0–100.0)
Platelets: 282 10*3/uL (ref 150–400)
RBC: 4.81 MIL/uL (ref 4.22–5.81)
RDW: 14.4 % (ref 11.5–15.5)
WBC: 13.6 10*3/uL — ABNORMAL HIGH (ref 4.0–10.5)
nRBC: 0 % (ref 0.0–0.2)

## 2021-01-14 LAB — URINALYSIS, ROUTINE W REFLEX MICROSCOPIC
Bilirubin Urine: NEGATIVE
Glucose, UA: NEGATIVE mg/dL
Hgb urine dipstick: NEGATIVE
Ketones, ur: NEGATIVE mg/dL
Leukocytes,Ua: NEGATIVE
Nitrite: NEGATIVE
Protein, ur: NEGATIVE mg/dL
Specific Gravity, Urine: 1.015 (ref 1.005–1.030)
pH: 7.5 (ref 5.0–8.0)

## 2021-01-14 LAB — COMPREHENSIVE METABOLIC PANEL
ALT: 25 U/L (ref 0–44)
AST: 19 U/L (ref 15–41)
Albumin: 3.4 g/dL — ABNORMAL LOW (ref 3.5–5.0)
Alkaline Phosphatase: 83 U/L (ref 38–126)
Anion gap: 10 (ref 5–15)
BUN: 10 mg/dL (ref 6–20)
CO2: 28 mmol/L (ref 22–32)
Calcium: 8.6 mg/dL — ABNORMAL LOW (ref 8.9–10.3)
Chloride: 99 mmol/L (ref 98–111)
Creatinine, Ser: 1.11 mg/dL (ref 0.61–1.24)
GFR, Estimated: >60 mL/min (ref >60)
Glucose, Bld: 120 mg/dL — ABNORMAL HIGH (ref 70–99)
Potassium: 3.5 mmol/L (ref 3.5–5.1)
Sodium: 137 mmol/L (ref 135–145)
Total Bilirubin: 0.8 mg/dL (ref 0.3–1.2)
Total Protein: 7.5 g/dL (ref 6.5–8.1)

## 2021-01-14 LAB — LIPASE, BLOOD: Lipase: 26 U/L (ref 11–51)

## 2021-01-14 MED ORDER — HYDROMORPHONE HCL 1 MG/ML IJ SOLN
1.0000 mg | Freq: Once | INTRAMUSCULAR | Status: AC
  Administered 2021-01-14: 1 mg via INTRAVENOUS
  Filled 2021-01-14: qty 1

## 2021-01-14 MED ORDER — IOHEXOL 350 MG/ML SOLN
80.0000 mL | Freq: Once | INTRAVENOUS | Status: AC | PRN
  Administered 2021-01-14: 80 mL via INTRAVENOUS

## 2021-01-14 MED ORDER — ONDANSETRON HCL 4 MG/2ML IJ SOLN
4.0000 mg | Freq: Once | INTRAMUSCULAR | Status: AC
  Administered 2021-01-14: 4 mg via INTRAVENOUS
  Filled 2021-01-14: qty 2

## 2021-01-14 NOTE — ED Provider Notes (Signed)
[facility]-EMERGENCY DEPT Provider Note   CSN: [id number] Arrival date & time: 01/14/21  2157     History Chief Complaint  Patient presents with   Abdominal Pain    Frank Howe is a 45 y.o. male with a history of umbilical hernia repair x2 and partial bowel obstruction secondary to this presents to the emergency department with lower abdominal pain radiating from the hernia onset earlier this afternoon while walking.  Patient reports he has had 1 episode of emesis since that time.  Reports normal bowel movement this morning, no bowel movement since onset of pain.  Patient and wife report hernia is protruding more than usual and is more tender today.  Denies fevers or chills.  Patient does have a history of hypertension and prediabetes.  He has not been taking any of his medications in the last several months because he does not think they are working.  No specific aggravating or alleviating factors today.  No treatments prior to arrival.  The history is provided by the patient, medical records and the spouse. No language interpreter was used.      Past Medical History:  Diagnosis Date   Diabetes mellitus type 2 in obese (HCC) 01/15/2021   Elevated blood pressure reading without diagnosis of hypertension    Gout    Impaired fasting blood sugar    Obesity    OSA (obstructive sleep apnea)     Patient Active Problem List   Diagnosis Date Noted   SBO (small bowel obstruction) (HCC) 01/15/2021   Essential hypertension 01/15/2021   Diabetes mellitus type 2 in obese (HCC) 01/15/2021   Obesity, Class III, BMI 40-49.9 (morbid obesity) (HCC) 01/15/2021   Umbilical hernia with obstruction but no gangrene 02/11/2018   Partial small bowel obstruction (HCC) 09/13/2017   Gout, chronic 07/19/2015   OSA (obstructive sleep apnea) 07/19/2015   Obesity 06/29/2015   Elevated uric acid in blood 06/29/2015   Impaired fasting blood sugar 06/28/2015   Knee swelling 06/28/2015    Right knee pain 06/28/2015   Elevated blood pressure reading without diagnosis of hypertension 06/28/2015   Routine general medical examination at a health care facility 08/21/2011    Past Surgical History:  Procedure Laterality Date   UMBILICAL HERNIA REPAIR N/A 09/14/2017   Procedure: LAPAROSCOPIC UMBILICAL HERNIA;  Surgeon: Romie Levee, MD;  Location: [facility];  Service: General;  Laterality: N/A;   UMBILICAL HERNIA REPAIR N/A 02/11/2018   Procedure: LAPARASCOPIC REDO REPAIR OF RECURRENT UMBILICAL HERNIA WITH INSERTION OF MESH, LYSIS OF ADHESIONS;  Surgeon: Andria Meuse, MD;  Location: [facility];  Service: General;  Laterality: N/A;       Family History  Problem Relation Age of Onset   Arthritis Mother    Hypertension Mother    Stroke Mother    Aneurysm Mother        died of brain aneurysm   Asthma Father    Cancer Father        ?   Arthritis Father    Heart disease Maternal Uncle    Diabetes Maternal Uncle    Alcohol abuse Neg Hx    Hyperlipidemia Neg Hx    Hearing loss Neg Hx    Kidney disease Neg Hx    Early death Neg Hx     Social History   Tobacco Use   Smoking status: Former    Packs/day: 0.25    Years: 4.00    Pack years: 1.00    Types: Cigarettes  Quit date: 12/29/2014    Years since quitting: 6.0   Smokeless tobacco: Never   Tobacco comments:    once every 2 wk  Vaping Use   Vaping Use: Never used  Substance Use Topics   Alcohol use: Not Currently   Drug use: No    Home Medications Prior to Admission medications   Medication Sig Start Date End Date Taking? Authorizing Provider  dicyclomine (BENTYL) 20 MG tablet Take 1 tablet (20 mg total) by mouth 2 (two) times daily. Patient not taking: Reported on 01/15/2021 02/04/20   Gailen Shelter, PA  lansoprazole (PREVACID SOLUTAB) 30 MG disintegrating tablet Take 1 tablet (30 mg total) by mouth daily at 12 noon. Patient not taking: Reported on 01/15/2021 04/15/19   Anders Simmonds, PA-C   lisinopril-hydrochlorothiazide (ZESTORETIC) 10-12.5 MG tablet Take 1 tablet by mouth daily. Patient not taking: Reported on 01/15/2021 04/15/19   Anders Simmonds, PA-C  losartan-hydrochlorothiazide [facility]) 100-12.5 MG tablet Take 1 tablet by mouth daily. Patient not taking: Reported on 01/15/2021 06/23/20   [provider]  meloxicam (MOBIC) 15 MG tablet Take one pill a day with food for 7 days and then prn thereafter Patient not taking: Reported on 01/15/2021 10/08/19   Christena Deem, MD  metFORMIN (GLUCOPHAGE-XR) 750 MG 24 hr tablet 1 tablet with evening meal Patient not taking: Reported on 01/15/2021    [provider]  naproxen (NAPROSYN) 500 MG tablet Take 1 tablet (500 mg total) by mouth 2 (two) times daily. Patient not taking: Reported on 01/15/2021 07/06/19   Hall-Potvin, [place], PA-C  ondansetron (ZOFRAN ODT) 4 MG disintegrating tablet Take 1 tablet (4 mg total) by mouth every 8 (eight) hours as needed for nausea or vomiting. Patient not taking: Reported on 01/15/2021 02/04/20   Gailen Shelter, PA  predniSONE (STERAPRED UNI-PAK 21 TAB) 10 MG (21) TBPK tablet 6 tabs for 1 day, then 5 tabs for 1 das, then 4 tabs for 1 day, then 3 tabs for 1 day, 2 tabs for 1 day, then 1 tab for 1 day Patient not taking: Reported on 01/15/2021 07/28/19   Janace Aris, NP  PRESCRIPTION MEDICATION Gout medication, unknown name Patient not taking: Reported on 01/15/2021    [provider]  Vitamin D, Ergocalciferol, (DRISDOL) 1.25 MG (50000 UNIT) CAPS capsule 1 capsule Patient not taking: Reported on 01/15/2021    [provider]    Allergies    Patient has no known allergies.  Review of Systems   Review of Systems  Constitutional:  Negative for appetite change, diaphoresis, fatigue, fever and unexpected weight change.  HENT:  Negative for mouth sores.   Eyes:  Negative for visual disturbance.  Respiratory:  Negative for cough, chest tightness, shortness of breath and  wheezing.   Cardiovascular:  Negative for chest pain.  Gastrointestinal:  Positive for abdominal pain, nausea and vomiting. Negative for constipation and diarrhea.  Endocrine: Negative for polydipsia, polyphagia and polyuria.  Genitourinary:  Negative for dysuria, frequency, hematuria and urgency.  Musculoskeletal:  Negative for back pain and neck stiffness.  Skin:  Negative for rash.  Allergic/Immunologic: Negative for immunocompromised state.  Neurological:  Negative for syncope, light-headedness and headaches.  Hematological:  Does not bruise/bleed easily.  Psychiatric/Behavioral:  Negative for sleep disturbance. The patient is not nervous/anxious.    Physical Exam Updated Vital Signs BP (!) 163/100   Pulse 87   Temp 98.2 F (36.8 C) (Oral)   Resp 20   Ht 5\' 8"  (1.727  m)   Wt (!) 145.2 kg   SpO2 94%   BMI 48.66 kg/m   Physical Exam Vitals and nursing note reviewed.  Constitutional:      General: He is not in acute distress.    Appearance: He is not diaphoretic.  HENT:     Head: Normocephalic.  Eyes:     General: No scleral icterus.    Conjunctiva/sclera: Conjunctivae normal.  Cardiovascular:     Rate and Rhythm: Normal rate and regular rhythm.     Pulses: Normal pulses.          Radial pulses are 2+ on the right side and 2+ on the left side.  Pulmonary:     Effort: Pulmonary effort is normal. No tachypnea, accessory muscle usage, prolonged expiration, respiratory distress or retractions.     Breath sounds: Normal breath sounds. No stridor.     Comments: Equal chest rise. No increased work of breathing. Abdominal:     General: There is no distension.     Palpations: Abdomen is soft.     Tenderness: There is abdominal tenderness in the periumbilical area. There is no guarding or rebound.     Hernia: A hernia is present. Hernia is present in the umbilical area.    Musculoskeletal:     Cervical back: Normal range of motion.     Comments: Moves all extremities  equally and without difficulty.  Skin:    General: Skin is warm and dry.     Capillary Refill: Capillary refill takes less than 2 seconds.  Neurological:     Mental Status: He is alert.     GCS: GCS eye subscore is 4. GCS verbal subscore is 5. GCS motor subscore is 6.     Comments: Speech is clear and goal oriented.  Psychiatric:        Mood and Affect: Mood normal.    ED Results / Procedures / Treatments   Labs (all labs ordered are listed, but only abnormal results are displayed) Labs Reviewed  COMPREHENSIVE METABOLIC PANEL - Abnormal; Notable for the following components:      Result Value   Glucose, Bld 120 (*)    Calcium 8.6 (*)    Albumin 3.4 (*)    All other components within normal limits  CBC - Abnormal; Notable for the following components:   WBC 13.6 (*)    Hemoglobin 12.7 (*)    All other components within normal limits  CBC - Abnormal; Notable for the following components:   WBC 14.6 (*)    Hemoglobin 12.3 (*)    All other components within normal limits  SARS CORONAVIRUS 2 (TAT 6-24 HRS)  LIPASE, BLOOD  URINALYSIS, ROUTINE W REFLEX MICROSCOPIC  HIV ANTIBODY (ROUTINE TESTING W REFLEX)  BASIC METABOLIC PANEL  HEMOGLOBIN A1C    EKG None  Radiology CT ABDOMEN PELVIS W CONTRAST  Result Date: 01/14/2021 CLINICAL DATA:  Complicated hernia. Patient reports left umbilical hernia, increasing in size. EXAM: CT ABDOMEN AND PELVIS WITH CONTRAST TECHNIQUE: Multidetector CT imaging of the abdomen and pelvis was performed using the standard protocol following bolus administration of intravenous contrast. CONTRAST:  71mL OMNIPAQUE IOHEXOL 350 MG/ML SOLN COMPARISON:  CT 02/11/2018 FINDINGS: Lower chest: Normal heart size. No acute airspace disease or pleural effusion. Hepatobiliary: Diffusely decreased hepatic density consistent with steatosis. The liver is enlarged spanning 21 cm cranial caudal. No focal hepatic lesion is seen. Possible gallstone without abnormal gallbladder  distention or pericholecystic fat stranding biliary dilatation. Pancreas: No ductal  dilatation or inflammation. Spleen: Normal in size without focal abnormality. Adrenals/Urinary Tract: Normal adrenal glands. No hydronephrosis or perinephric edema. No renal calculi or focal renal abnormality. Homogeneous enhancement with symmetric excretion on delayed phase imaging. Urinary bladder is partially distended. Stomach/Bowel: Periumbilical hernia just to the left of midline contains moderate length segment of small bowel. The bowel within the hernia and proximally are dilated and fluid-filled. The exiting small bowel and more distal bowel loops are decompressed. Slight edematous appearance of a dilated small bowel loops in the central abdomen with wall thickening and mesenteric edema. No pneumatosis. Stomach is mildly distended. Normal appendix. Small to moderate colonic stool burden. Vascular/Lymphatic: Mild aortic and branch atherosclerosis. No aortic aneurysm. Patent portal vein. No enlarged lymph nodes in the abdomen or pelvis. Reproductive: Prostate is unremarkable. Other: Periumbilical hernia to the left of midline, moderate in size containing moderate length of small bowel with subsequent small-bowel obstruction. Mild mesenteric edema with trace mesenteric free fluid, and free fluid tracking into the pelvis. No perforation or abscess. Minimal fat in the left greater than right inguinal canal. Musculoskeletal: There are no acute or suspicious osseous abnormalities. Stable bone island in the right iliac bone. There is degenerative change involving the spine and both hips. IMPRESSION: 1. Small-bowel obstruction with transition point within periumbilical hernia extending to the left of midline. Mild mesenteric edema with trace mesenteric free fluid, and free fluid tracking into the pelvis. No perforation or abscess. 2. Hepatomegaly and hepatic steatosis. 3. Possible gallstone without gallbladder inflammation. Aortic  Atherosclerosis (ICD10-I70.0). Electronically Signed   By: Narda Rutherford M.D.   On: 01/14/2021 23:35   DG Abdomen Acute W/Chest  Result Date: 01/14/2021 CLINICAL DATA:  Abdominal pain, hernia, small-bowel obstruction EXAM: DG ABDOMEN ACUTE WITH 1 VIEW CHEST COMPARISON:  02/11/2018 FINDINGS: Lungs are clear. No pneumothorax or pleural effusion. Cardiac size within normal limits. Multiple gas-filled dilated loops of small bowel are seen within the left mid abdomen. Gas is seen within multiple nondilated loops of distal small bowel within the right lower quadrant as well as throughout the colon and, together, the findings suggest changes of a partial or developing complete small bowel obstruction. There is no free intraperitoneal gas. No organomegaly. No abnormal calcifications within the abdomen and pelvis. Degenerative changes are noted within the hips bilaterally IMPRESSION: Partial or developing complete mid small bowel obstruction Electronically Signed   By: Helyn Numbers M.D.   On: 01/14/2021 23:17    Procedures Procedures   Medications Ordered in ED Medications  lactated ringers infusion ( Intravenous New Bag/Given 01/15/21 0328)  acetaminophen (TYLENOL) tablet 650 mg (has no administration in time range)    Or  acetaminophen (TYLENOL) suppository 650 mg (has no administration in time range)  HYDROmorphone (DILAUDID) injection 1 mg (has no administration in time range)  ondansetron (ZOFRAN) tablet 4 mg (has no administration in time range)    Or  ondansetron (ZOFRAN) injection 4 mg (has no administration in time range)  hydrALAZINE (APRESOLINE) injection 10 mg (has no administration in time range)  ondansetron (ZOFRAN) injection 4 mg (4 mg Intravenous Given 01/14/21 2330)  HYDROmorphone (DILAUDID) injection 1 mg (1 mg Intravenous Given 01/14/21 2330)  iohexol (OMNIPAQUE) 350 MG/ML injection 80 mL (80 mLs Intravenous Contrast Given 01/14/21 2309)    ED Course  I have reviewed the triage  vital signs and the nursing notes.  Pertinent labs & imaging results that were available during my care of the patient were reviewed by me and considered in  my medical decision making (see chart for details).  Clinical Course as of 01/15/21 0408  Sat Jan 15, 2021  0222 Discussed with Dr. Carolynne Edouard who will evaluate in the AM [HM]    Clinical Course User Index [HM] Catlin Doria, Boyd Kerbs   MDM Rules/Calculators/A&P                           Patient presents with history of large umbilical hernia now with pain and increased distention.  Concern for possible incarceration.  Unable to fully reduce here in the emergency department.  Will apply ice and Trendelenburg.  CT scan pending to ensure no incarceration, strangulation or bowel obstruction.  12:35 AM CT scan shows bowel obstruction with transition point with an the umbilical hernia.  Will discuss with Carolynne Edouard.   Patient admitted to [facility].  Surgery will consult in the morning.  Patient remained stable at this time.   Final Clinical Impression(s) / ED Diagnoses Final diagnoses:  Umbilical hernia with obstruction, without gangrene  SBO (small bowel obstruction) [facility])    Rx / DC Orders ED Discharge Orders     None        Aleza Pew, Boyd Kerbs 01/15/21 0408    Tilden Fossa, MD 01/15/21 1921

## 2021-01-14 NOTE — ED Triage Notes (Signed)
Pt reports left umbilical hernia that has been problematic for the last few months. Pt admits no longer able to reduce hernia and it has been increasing in size. Last BM today. Took laxatives and vomited x 1. Hx of 2 surgeries on same hernia.

## 2021-01-14 NOTE — ED Notes (Signed)
Patient transported to X-ray 

## 2021-01-15 ENCOUNTER — Encounter (HOSPITAL_COMMUNITY): Payer: Self-pay | Admitting: Family Medicine

## 2021-01-15 DIAGNOSIS — M109 Gout, unspecified: Secondary | ICD-10-CM | POA: Diagnosis present

## 2021-01-15 DIAGNOSIS — Z6841 Body Mass Index (BMI) 40.0 and over, adult: Secondary | ICD-10-CM | POA: Diagnosis not present

## 2021-01-15 DIAGNOSIS — E669 Obesity, unspecified: Secondary | ICD-10-CM

## 2021-01-15 DIAGNOSIS — E1169 Type 2 diabetes mellitus with other specified complication: Secondary | ICD-10-CM | POA: Diagnosis not present

## 2021-01-15 DIAGNOSIS — Z20822 Contact with and (suspected) exposure to covid-19: Secondary | ICD-10-CM | POA: Diagnosis present

## 2021-01-15 DIAGNOSIS — Z79899 Other long term (current) drug therapy: Secondary | ICD-10-CM | POA: Diagnosis not present

## 2021-01-15 DIAGNOSIS — D72829 Elevated white blood cell count, unspecified: Secondary | ICD-10-CM | POA: Diagnosis present

## 2021-01-15 DIAGNOSIS — Z7984 Long term (current) use of oral hypoglycemic drugs: Secondary | ICD-10-CM | POA: Diagnosis not present

## 2021-01-15 DIAGNOSIS — I10 Essential (primary) hypertension: Secondary | ICD-10-CM | POA: Diagnosis present

## 2021-01-15 DIAGNOSIS — Z87891 Personal history of nicotine dependence: Secondary | ICD-10-CM | POA: Diagnosis not present

## 2021-01-15 DIAGNOSIS — E119 Type 2 diabetes mellitus without complications: Secondary | ICD-10-CM | POA: Diagnosis present

## 2021-01-15 DIAGNOSIS — K420 Umbilical hernia with obstruction, without gangrene: Secondary | ICD-10-CM | POA: Diagnosis not present

## 2021-01-15 DIAGNOSIS — K56609 Unspecified intestinal obstruction, unspecified as to partial versus complete obstruction: Secondary | ICD-10-CM

## 2021-01-15 DIAGNOSIS — Z8249 Family history of ischemic heart disease and other diseases of the circulatory system: Secondary | ICD-10-CM | POA: Diagnosis not present

## 2021-01-15 DIAGNOSIS — G4733 Obstructive sleep apnea (adult) (pediatric): Secondary | ICD-10-CM | POA: Diagnosis present

## 2021-01-15 DIAGNOSIS — Z833 Family history of diabetes mellitus: Secondary | ICD-10-CM | POA: Diagnosis not present

## 2021-01-15 DIAGNOSIS — Z791 Long term (current) use of non-steroidal anti-inflammatories (NSAID): Secondary | ICD-10-CM | POA: Diagnosis not present

## 2021-01-15 HISTORY — DX: Type 2 diabetes mellitus with other specified complication: E11.69

## 2021-01-15 HISTORY — DX: Type 2 diabetes mellitus with other specified complication: E66.9

## 2021-01-15 LAB — CBC
HCT: 40 % (ref 39.0–52.0)
Hemoglobin: 12.3 g/dL — ABNORMAL LOW (ref 13.0–17.0)
MCH: 26.5 pg (ref 26.0–34.0)
MCHC: 30.8 g/dL (ref 30.0–36.0)
MCV: 86 fL (ref 80.0–100.0)
Platelets: 296 10*3/uL (ref 150–400)
RBC: 4.65 MIL/uL (ref 4.22–5.81)
RDW: 14.6 % (ref 11.5–15.5)
WBC: 14.6 10*3/uL — ABNORMAL HIGH (ref 4.0–10.5)
nRBC: 0 % (ref 0.0–0.2)

## 2021-01-15 LAB — HEMOGLOBIN A1C
Hgb A1c MFr Bld: 6.8 % — ABNORMAL HIGH (ref 4.8–5.6)
Mean Plasma Glucose: 148.46 mg/dL

## 2021-01-15 LAB — BASIC METABOLIC PANEL
Anion gap: 7 (ref 5–15)
BUN: 10 mg/dL (ref 6–20)
CO2: 31 mmol/L (ref 22–32)
Calcium: 8.8 mg/dL — ABNORMAL LOW (ref 8.9–10.3)
Chloride: 104 mmol/L (ref 98–111)
Creatinine, Ser: 1.11 mg/dL (ref 0.61–1.24)
GFR, Estimated: >60 mL/min (ref >60)
Glucose, Bld: 115 mg/dL — ABNORMAL HIGH (ref 70–99)
Potassium: 4.4 mmol/L (ref 3.5–5.1)
Sodium: 142 mmol/L (ref 135–145)

## 2021-01-15 LAB — SARS CORONAVIRUS 2 (TAT 6-24 HRS): SARS Coronavirus 2: NEGATIVE

## 2021-01-15 LAB — HIV ANTIBODY (ROUTINE TESTING W REFLEX): HIV Screen 4th Generation wRfx: NONREACTIVE

## 2021-01-15 MED ORDER — LACTATED RINGERS IV SOLN: INTRAVENOUS | Status: DC

## 2021-01-15 MED ORDER — ACETAMINOPHEN 325 MG PO TABS: 650.0000 mg | ORAL_TABLET | Freq: Four times a day (QID) | ORAL | Status: DC | PRN

## 2021-01-15 MED ORDER — ONDANSETRON HCL 4 MG PO TABS: 4.0000 mg | ORAL_TABLET | Freq: Four times a day (QID) | ORAL | Status: DC | PRN

## 2021-01-15 MED ORDER — HYDROMORPHONE HCL 1 MG/ML IJ SOLN
1.0000 mg | INTRAMUSCULAR | Status: DC | PRN
  Administered 2021-01-15: 1 mg via INTRAVENOUS
  Filled 2021-01-15 (×2): qty 1

## 2021-01-15 MED ORDER — ACETAMINOPHEN 650 MG RE SUPP: 650.0000 mg | Freq: Four times a day (QID) | RECTAL | Status: DC | PRN

## 2021-01-15 MED ORDER — ONDANSETRON HCL 4 MG/2ML IJ SOLN: 4.0000 mg | Freq: Four times a day (QID) | INTRAMUSCULAR | Status: DC | PRN

## 2021-01-15 MED ORDER — HYDRALAZINE HCL 20 MG/ML IJ SOLN: 10.0000 mg | Freq: Three times a day (TID) | INTRAMUSCULAR | Status: DC | PRN

## 2021-01-15 NOTE — Consult Note (Signed)
Reason for Consult:hernia Referring Physician: Dr. Timmothy Euler Frank Howe is an 45 y.o. male.  HPI: The patient is a 45 year old black male who presented last night with abd pain and a bulge at umbilicus. CT showed a hernia at umbilicus with evidence of obstruction from hernia. He feels better this am. He is passing flatus.  Past Medical History:  Diagnosis Date   Diabetes mellitus type 2 in obese (HCC) 01/15/2021   Elevated blood pressure reading without diagnosis of hypertension    Gout    Impaired fasting blood sugar    Obesity    OSA (obstructive sleep apnea)     Past Surgical History:  Procedure Laterality Date   UMBILICAL HERNIA REPAIR N/A 09/14/2017   Procedure: LAPAROSCOPIC UMBILICAL HERNIA;  Surgeon: Romie Levee, MD;  Location: [facility];  Service: General;  Laterality: N/A;   UMBILICAL HERNIA REPAIR N/A 02/11/2018   Procedure: LAPARASCOPIC REDO REPAIR OF RECURRENT UMBILICAL HERNIA WITH INSERTION OF MESH, LYSIS OF ADHESIONS;  Surgeon: Andria Meuse, MD;  Location: [facility];  Service: General;  Laterality: N/A;    Family History  Problem Relation Age of Onset   Arthritis Mother    Hypertension Mother    Stroke Mother    Aneurysm Mother        died of brain aneurysm   Asthma Father    Cancer Father        ?   Arthritis Father    Heart disease Maternal Uncle    Diabetes Maternal Uncle    Alcohol abuse Neg Hx    Hyperlipidemia Neg Hx    Hearing loss Neg Hx    Kidney disease Neg Hx    Early death Neg Hx     Social History:  reports that he quit smoking about 6 years ago. His smoking use included cigarettes. He has a 1.00 pack-year smoking history. He has never used smokeless tobacco. He reports that he does not currently use alcohol. He reports that he does not use drugs.  Allergies: No Known Allergies  Medications: I have reviewed the patient's current medications.  Results for orders placed or performed during the hospital encounter of 01/14/21 (from the past  48 hour(s))  Lipase, blood     Status: None   Collection Time: 01/14/21 10:25 PM  Result Value Ref Range   Lipase 26 11 - 51 U/L    Comment: Performed at [facility], [address] [place], [place] [address]  Comprehensive metabolic panel     Status: Abnormal   Collection Time: 01/14/21 10:25 PM  Result Value Ref Range   Sodium 137 135 - 145 mmol/L   Potassium 3.5 3.5 - 5.1 mmol/L   Chloride 99 98 - 111 mmol/L   CO2 28 22 - 32 mmol/L   Glucose, Bld 120 (H) 70 - 99 mg/dL    Comment: Glucose reference range applies only to samples taken after fasting for at least 8 hours.   BUN 10 6 - 20 mg/dL   Creatinine, Ser 0.99 0.61 - 1.24 mg/dL   Calcium 8.6 (L) 8.9 - 10.3 mg/dL   Total Protein 7.5 6.5 - 8.1 g/dL   Albumin 3.4 (L) 3.5 - 5.0 g/dL   AST 19 15 - 41 U/L   ALT 25 0 - 44 U/L   Alkaline Phosphatase 83 38 - 126 U/L   Total Bilirubin 0.8 0.3 - 1.2 mg/dL   GFR, Estimated >83 >38 mL/min    Comment: (NOTE) Calculated  using the CKD-EPI Creatinine Equation (2021)    Anion gap 10 5 - 15    Comment: Performed at [facility], [address], [place], [place] [address]  CBC     Status: Abnormal   Collection Time: 01/14/21 10:25 PM  Result Value Ref Range   WBC 13.6 (H) 4.0 - 10.5 K/uL   RBC 4.81 4.22 - 5.81 MIL/uL   Hemoglobin 12.7 (L) 13.0 - 17.0 g/dL   HCT 60.4 54.0 - 98.1 %   MCV 85.2 80.0 - 100.0 fL   MCH 26.4 26.0 - 34.0 pg   MCHC 31.0 30.0 - 36.0 g/dL   RDW 19.1 47.8 - 29.5 %   Platelets 282 150 - 400 K/uL   nRBC 0.0 0.0 - 0.2 %    Comment: Performed at [facility], [address] [place], [place] [address]  Urinalysis, Routine w reflex microscopic Urine, Clean Catch     Status: None   Collection Time: 01/14/21 11:30 PM  Result Value Ref Range   Color, Urine YELLOW YELLOW   APPearance CLEAR CLEAR   Specific Gravity, Urine 1.015 1.005 - 1.030   pH 7.5 5.0 - 8.0   Glucose, UA NEGATIVE NEGATIVE mg/dL   Hgb urine  dipstick NEGATIVE NEGATIVE   Bilirubin Urine NEGATIVE NEGATIVE   Ketones, ur NEGATIVE NEGATIVE mg/dL   Protein, ur NEGATIVE NEGATIVE mg/dL   Nitrite NEGATIVE NEGATIVE   Leukocytes,Ua NEGATIVE NEGATIVE    Comment: Microscopic not done on urines with negative protein, blood, leukocytes, nitrite, or glucose < 500 mg/dL. Performed at [facility], [address] [place], [place] [address]   Basic metabolic panel     Status: Abnormal   Collection Time: 01/15/21  3:30 AM  Result Value Ref Range   Sodium 142 135 - 145 mmol/L   Potassium 4.4 3.5 - 5.1 mmol/L    Comment: SLIGHT HEMOLYSIS DELTA CHECK NOTED    Chloride 104 98 - 111 mmol/L   CO2 31 22 - 32 mmol/L   Glucose, Bld 115 (H) 70 - 99 mg/dL    Comment: Glucose reference range applies only to samples taken after fasting for at least 8 hours.   BUN 10 6 - 20 mg/dL   Creatinine, Ser 4.69 0.61 - 1.24 mg/dL   Calcium 8.8 (L) 8.9 - 10.3 mg/dL   GFR, Estimated >62 >95 mL/min    Comment: (NOTE) Calculated using the CKD-EPI Creatinine Equation (2021)    Anion gap 7 5 - 15    Comment: Performed at [facility], [address], [place], [place] [address]  CBC     Status: Abnormal   Collection Time: 01/15/21  3:30 AM  Result Value Ref Range   WBC 14.6 (H) 4.0 - 10.5 K/uL   RBC 4.65 4.22 - 5.81 MIL/uL   Hemoglobin 12.3 (L) 13.0 - 17.0 g/dL   HCT 24.4 01.0 - 27.2 %   MCV 86.0 80.0 - 100.0 fL   MCH 26.5 26.0 - 34.0 pg   MCHC 30.8 30.0 - 36.0 g/dL   RDW 53.6 64.4 - 03.4 %   Platelets 296 150 - 400 K/uL   nRBC 0.0 0.0 - 0.2 %    Comment: Performed at [facility], [address], [place], [place] [address]    CT ABDOMEN PELVIS W CONTRAST  Result Date: 01/14/2021 CLINICAL DATA:  Complicated hernia. Patient reports left umbilical hernia, increasing in size. EXAM: CT ABDOMEN AND PELVIS WITH  CONTRAST TECHNIQUE: Multidetector CT imaging of the abdomen and pelvis was performed using the  standard protocol following bolus administration of intravenous contrast. CONTRAST:  53mL OMNIPAQUE IOHEXOL 350 MG/ML SOLN COMPARISON:  CT 02/11/2018 FINDINGS: Lower chest: Normal heart size. No acute airspace disease or pleural effusion. Hepatobiliary: Diffusely decreased hepatic density consistent with steatosis. The liver is enlarged spanning 21 cm cranial caudal. No focal hepatic lesion is seen. Possible gallstone without abnormal gallbladder distention or pericholecystic fat stranding biliary dilatation. Pancreas: No ductal dilatation or inflammation. Spleen: Normal in size without focal abnormality. Adrenals/Urinary Tract: Normal adrenal glands. No hydronephrosis or perinephric edema. No renal calculi or focal renal abnormality. Homogeneous enhancement with symmetric excretion on delayed phase imaging. Urinary bladder is partially distended. Stomach/Bowel: Periumbilical hernia just to the left of midline contains moderate length segment of small bowel. The bowel within the hernia and proximally are dilated and fluid-filled. The exiting small bowel and more distal bowel loops are decompressed. Slight edematous appearance of a dilated small bowel loops in the central abdomen with wall thickening and mesenteric edema. No pneumatosis. Stomach is mildly distended. Normal appendix. Small to moderate colonic stool burden. Vascular/Lymphatic: Mild aortic and branch atherosclerosis. No aortic aneurysm. Patent portal vein. No enlarged lymph nodes in the abdomen or pelvis. Reproductive: Prostate is unremarkable. Other: Periumbilical hernia to the left of midline, moderate in size containing moderate length of small bowel with subsequent small-bowel obstruction. Mild mesenteric edema with trace mesenteric free fluid, and free fluid tracking into the pelvis. No perforation or abscess. Minimal fat in the left greater than right inguinal canal. Musculoskeletal: There are no acute or suspicious osseous abnormalities. Stable  bone island in the right iliac bone. There is degenerative change involving the spine and both hips. IMPRESSION: 1. Small-bowel obstruction with transition point within periumbilical hernia extending to the left of midline. Mild mesenteric edema with trace mesenteric free fluid, and free fluid tracking into the pelvis. No perforation or abscess. 2. Hepatomegaly and hepatic steatosis. 3. Possible gallstone without gallbladder inflammation. Aortic Atherosclerosis (ICD10-I70.0). Electronically Signed   By: Narda Rutherford M.D.   On: 01/14/2021 23:35   DG Abdomen Acute W/Chest  Result Date: 01/14/2021 CLINICAL DATA:  Abdominal pain, hernia, small-bowel obstruction EXAM: DG ABDOMEN ACUTE WITH 1 VIEW CHEST COMPARISON:  02/11/2018 FINDINGS: Lungs are clear. No pneumothorax or pleural effusion. Cardiac size within normal limits. Multiple gas-filled dilated loops of small bowel are seen within the left mid abdomen. Gas is seen within multiple nondilated loops of distal small bowel within the right lower quadrant as well as throughout the colon and, together, the findings suggest changes of a partial or developing complete small bowel obstruction. There is no free intraperitoneal gas. No organomegaly. No abnormal calcifications within the abdomen and pelvis. Degenerative changes are noted within the hips bilaterally IMPRESSION: Partial or developing complete mid small bowel obstruction Electronically Signed   By: Helyn Numbers M.D.   On: 01/14/2021 23:17    Review of Systems  Constitutional: Negative.   HENT: Negative.    Eyes: Negative.   Respiratory: Negative.    Cardiovascular: Negative.   Gastrointestinal:  Positive for abdominal distention and abdominal pain.  Endocrine: Negative.   Genitourinary: Negative.   Musculoskeletal: Negative.   Skin: Negative.   Allergic/Immunologic: Negative.   Neurological: Negative.   Hematological: Negative.   Psychiatric/Behavioral: Negative.    Blood pressure (!)  141/79, pulse 90, temperature 98.2 F (36.8 C), temperature source Oral, resp. rate 19, height 5\' 8"  (1.727 m),  weight (!) 145.2 kg, SpO2 94 %. Physical Exam Constitutional:      General: He is not in acute distress.    Appearance: Normal appearance. He is obese.  HENT:     Head: Normocephalic and atraumatic.     Right Ear: External ear normal.     Left Ear: External ear normal.     Nose: Nose normal.     Mouth/Throat:     Mouth: Mucous membranes are moist.     Pharynx: Oropharynx is clear.  Eyes:     Extraocular Movements: Extraocular movements intact.     Conjunctiva/sclera: Conjunctivae normal.     Pupils: Pupils are equal, round, and reactive to light.  Cardiovascular:     Rate and Rhythm: Normal rate and regular rhythm.     Pulses: Normal pulses.     Heart sounds: Normal heart sounds.  Pulmonary:     Effort: Pulmonary effort is normal. No respiratory distress.     Breath sounds: Normal breath sounds.  Abdominal:     Palpations: Abdomen is soft.     Comments: Soft reducible umbilical hernia  Musculoskeletal:        General: No tenderness or deformity. Normal range of motion.     Cervical back: Normal range of motion and neck supple.  Skin:    General: Skin is warm and dry.     Findings: No erythema.  Neurological:     General: No focal deficit present.     Mental Status: He is alert and oriented to person, place, and time.  Psychiatric:        Mood and Affect: Mood normal.        Behavior: Behavior normal.    Assessment/Plan: The patient appears to have a reducible recurrent umbilical hernia. My recommendation would be for him to decompress and then have hernia fixed during this admission. The patient and family would like to have food and if he can tolerate then they want to follow up as an outpt to schedule surgery. Will follow  Frank Howe III 01/15/2021, 9:10 AM

## 2021-01-15 NOTE — ED Notes (Signed)
Ice applied to umbilical hernia and pt placed in trendelenburg

## 2021-01-15 NOTE — Progress Notes (Signed)
Patient ID: Frank Howe, male   DOB: May 07, 1975, 45 y.o.   MRN: [id number] Patient admitted early this morning for small bowel obstruction.  Patient seen and examined at bedside.  Plan of care discussed with him and wife at bedside.  I have reviewed patient's medical records including this morning's H&P, current vitals, labs, medications myself.  Continue n.p.o., IV fluids, pain management.  Follow general surgery recommendations.

## 2021-01-15 NOTE — H&P (Signed)
History and Physical    Dawood Hallmon GEZ:[id number] DOB: 26-Aug-1975 DOA: 01/14/2021  PCP: Dois Davenport, MD   Patient coming from: Home  Chief Complaint: Abdominal pain, nausea and vomiting  HPI: Frank Howe is a 45 y.o. male with medical history significant for HTN, DMT2, morbid obesity. Is currently not taking any medication for HTN or DMT2. States he stopped taking it a few months ago. Unsure of last HgbA1c level.  Reports he has had umbilical hernia repair twice in the past.  Reports he started having lower abdominal pain on the morning of January 14, 2021 that began to radiate across his entire abdomen.  By the afternoon he was having worsening pain and had developed nausea and vomiting.  He does report he had a small bowel movement that was normal when the pain first started but has not had any since then.  He does have an umbilical hernia which will protrude at times but this was more pronounced today and more tender than normal.  He has not had any fever or chills.  He has not had any abdominal injury or trauma. Lives with his wife.  Drinks alcohol socially.  Denies tobacco or illicit drug use.  ED Course: In the emergency room he has had elevated blood pressure in the 1 60-1 70 range systolically.  He had a small umbilical hernia that was partially reduced the emergency room and CT scan after reducing it showed small bowel obstruction with transition point in the periumbilical region.  Abs revealed WBC 13,600 hemoglobin 12.7 hematocrit 41 platelet count 282,000 sodium 137 potassium 3.5 chloride 99 bicarb 28 creatinine 1.11 BUN 10 glucose 120 alkaline phosphatase 83 AST 19 ALT 25 lipase 26.  General surgery was consulted and Dr. Carolynne Edouard will see patient in the morning.  Hospitalist service has been asked admit for further management  Review of Systems:  General: Denies fever, chills, weight loss, night sweats.  Denies dizziness.  HENT: Denies head trauma, headache, denies change in  hearing, tinnitus. Denies nasal congestion. Denies sore throat Eyes: Denies blurry vision, pain in eye, drainage.  Denies discoloration of eyes. Neck: Denies pain.  Denies swelling.  Denies pain with movement. Cardiovascular: Denies chest pain, palpitations.  Denies edema.  Denies orthopnea Respiratory: Denies shortness of breath, cough.  Denies wheezing.  Denies sputum production Gastrointestinal: Reports abdominal pain, swelling. Reports nausea, vomiting. Denies diarrhea.  Denies melena.  Denies hematemesis. Musculoskeletal: Denies limitation of movement.  Denies deformity or swelling.  Denies pain.  Denies arthralgias or myalgias. Genitourinary: Denies pelvic pain.  Denies urinary frequency or hesitancy.  Denies dysuria.  Skin: Denies rash.  Denies petechiae, purpura, ecchymosis. Neurological:Denies syncope. Denies seizure activity. Denies paresthesia. Denies slurred speech, drooping face.  Denies visual change. Psychiatric: Denies depression, anxiety. Denies hallucinations.  Past Medical History:  Diagnosis Date   Diabetes mellitus type 2 in obese (HCC) 01/15/2021   Elevated blood pressure reading without diagnosis of hypertension    Gout    Impaired fasting blood sugar    Obesity    OSA (obstructive sleep apnea)     Past Surgical History:  Procedure Laterality Date   UMBILICAL HERNIA REPAIR N/A 09/14/2017   Procedure: LAPAROSCOPIC UMBILICAL HERNIA;  Surgeon: Romie Levee, MD;  Location: [facility];  Service: General;  Laterality: N/A;   UMBILICAL HERNIA REPAIR N/A 02/11/2018   Procedure: LAPARASCOPIC REDO REPAIR OF RECURRENT UMBILICAL HERNIA WITH INSERTION OF MESH, LYSIS OF ADHESIONS;  Surgeon: Andria Meuse, MD;  Location: [facility];  Service: General;  Laterality: N/A;    Social History  reports that he quit smoking about 6 years ago. His smoking use included cigarettes. He has a 1.00 pack-year smoking history. He has never used smokeless tobacco. He reports that he does not  currently use alcohol. He reports that he does not use drugs.  No Known Allergies  Family History  Problem Relation Age of Onset   Arthritis Mother    Hypertension Mother    Stroke Mother    Aneurysm Mother        died of brain aneurysm   Asthma Father    Cancer Father        ?   Arthritis Father    Heart disease Maternal Uncle    Diabetes Maternal Uncle    Alcohol abuse Neg Hx    Hyperlipidemia Neg Hx    Hearing loss Neg Hx    Kidney disease Neg Hx    Early death Neg Hx      Prior to Admission medications   Medication Sig Start Date End Date Taking? Authorizing Provider  dicyclomine (BENTYL) 20 MG tablet Take 1 tablet (20 mg total) by mouth 2 (two) times daily. Patient not taking: Reported on 01/15/2021 02/04/20   Gailen Shelter, PA  lansoprazole (PREVACID SOLUTAB) 30 MG disintegrating tablet Take 1 tablet (30 mg total) by mouth daily at 12 noon. Patient not taking: Reported on 01/15/2021 04/15/19   Anders Simmonds, PA-C  lisinopril-hydrochlorothiazide (ZESTORETIC) 10-12.5 MG tablet Take 1 tablet by mouth daily. Patient not taking: Reported on 01/15/2021 04/15/19   Anders Simmonds, PA-C  losartan-hydrochlorothiazide [facility]) 100-12.5 MG tablet Take 1 tablet by mouth daily. Patient not taking: Reported on 01/15/2021 06/23/20   [provider]  meloxicam (MOBIC) 15 MG tablet Take one pill a day with food for 7 days and then prn thereafter Patient not taking: Reported on 01/15/2021 10/08/19   Christena Deem, MD  metFORMIN (GLUCOPHAGE-XR) 750 MG 24 hr tablet 1 tablet with evening meal Patient not taking: Reported on 01/15/2021    [provider]  naproxen (NAPROSYN) 500 MG tablet Take 1 tablet (500 mg total) by mouth 2 (two) times daily. Patient not taking: Reported on 01/15/2021 07/06/19   Hall-Potvin, [place], PA-C  ondansetron (ZOFRAN ODT) 4 MG disintegrating tablet Take 1 tablet (4 mg total) by mouth every 8 (eight) hours as needed for nausea or  vomiting. Patient not taking: Reported on 01/15/2021 02/04/20   Gailen Shelter, PA  predniSONE (STERAPRED UNI-PAK 21 TAB) 10 MG (21) TBPK tablet 6 tabs for 1 day, then 5 tabs for 1 das, then 4 tabs for 1 day, then 3 tabs for 1 day, 2 tabs for 1 day, then 1 tab for 1 day Patient not taking: Reported on 01/15/2021 07/28/19   Janace Aris, NP  PRESCRIPTION MEDICATION Gout medication, unknown name Patient not taking: Reported on 01/15/2021    [provider]  Vitamin D, Ergocalciferol, (DRISDOL) 1.25 MG (50000 UNIT) CAPS capsule 1 capsule Patient not taking: Reported on 01/15/2021    [provider]    Physical Exam: Vitals:   01/15/21 0100 01/15/21 0130 01/15/21 0200 01/15/21 0230  BP: (!) 166/87 (!) 164/83 (!) 164/88 (!) 161/81  Pulse: 84 90 99 83  Resp: 18 20 18 17   Temp:      TempSrc:      SpO2: 96% 96% 97% 94%  Weight:      Height:  Constitutional: NAD, calm, comfortable Vitals:   01/15/21 0100 01/15/21 0130 01/15/21 0200 01/15/21 0230  BP: (!) 166/87 (!) 164/83 (!) 164/88 (!) 161/81  Pulse: 84 90 99 83  Resp: 18 20 18 17   Temp:      TempSrc:      SpO2: 96% 96% 97% 94%  Weight:      Height:       General: WDWN, Alert and oriented x3.  Eyes: EOMI, PERRL, conjunctivae normal.  Sclera nonicteric HENT:  Benjamin Perez/AT, external ears normal.  Nares patent without epistasis.  Mucous membranes are dry Neck: Soft, normal range of motion, supple, no masses, no thyromegaly.  Trachea midline Respiratory: clear to auscultation bilaterally, no wheezing, no crackles. Normal respiratory effort. No accessory muscle use.  Cardiovascular: Regular rate and rhythm, no murmurs / rubs / gallops. No extremity edema.  Abdomen: Soft, periumbilical tenderness, nondistended, no rebound or guarding.  Umbilical hernia that is soft and partially reducible. No masses palpated. Bowel sounds hypoactive Musculoskeletal: FROM. no  cyanosis. No joint deformity upper and lower extremities. Normal  muscle tone.  Skin: Warm, dry, intact no rashes, lesions, ulcers. No induration Neurologic: CN 2-12 grossly intact.  Normal speech.  Sensation intact, Strength 5/5 in all extremities.   Psychiatric: Normal judgment and insight.  Normal mood.    Labs on Admission: I have personally reviewed following labs and imaging studies  CBC: Recent Labs  Lab 01/14/21 2225  WBC 13.6*  HGB 12.7*  HCT 41.0  MCV 85.2  PLT 282    Basic Metabolic Panel: Recent Labs  Lab 01/14/21 2225  NA 137  K 3.5  CL 99  CO2 28  GLUCOSE 120*  BUN 10  CREATININE 1.11  CALCIUM 8.6*    GFR: Estimated Creatinine Clearance: 119 mL/min (by C-G formula based on SCr of 1.11 mg/dL).  Liver Function Tests: Recent Labs  Lab 01/14/21 2225  AST 19  ALT 25  ALKPHOS 83  BILITOT 0.8  PROT 7.5  ALBUMIN 3.4*    Urine analysis:    Component Value Date/Time   COLORURINE YELLOW 01/14/2021 2330   APPEARANCEUR CLEAR 01/14/2021 2330   LABSPEC 1.015 01/14/2021 2330   PHURINE 7.5 01/14/2021 2330   GLUCOSEU NEGATIVE 01/14/2021 2330   GLUCOSEU NEGATIVE 08/21/2011 1147   HGBUR NEGATIVE 01/14/2021 2330   BILIRUBINUR NEGATIVE 01/14/2021 2330   BILIRUBINUR neg 01/27/2013 0829   KETONESUR NEGATIVE 01/14/2021 2330   PROTEINUR NEGATIVE 01/14/2021 2330   UROBILINOGEN negative 01/27/2013 0829   UROBILINOGEN 0.2 08/21/2011 1147   NITRITE NEGATIVE 01/14/2021 2330   LEUKOCYTESUR NEGATIVE 01/14/2021 2330    Radiological Exams on Admission: CT ABDOMEN PELVIS W CONTRAST  Result Date: 01/14/2021 CLINICAL DATA:  Complicated hernia. Patient reports left umbilical hernia, increasing in size. EXAM: CT ABDOMEN AND PELVIS WITH CONTRAST TECHNIQUE: Multidetector CT imaging of the abdomen and pelvis was performed using the standard protocol following bolus administration of intravenous contrast. CONTRAST:  27mL OMNIPAQUE IOHEXOL 350 MG/ML SOLN COMPARISON:  CT 02/11/2018 FINDINGS: Lower chest: Normal heart size. No acute airspace  disease or pleural effusion. Hepatobiliary: Diffusely decreased hepatic density consistent with steatosis. The liver is enlarged spanning 21 cm cranial caudal. No focal hepatic lesion is seen. Possible gallstone without abnormal gallbladder distention or pericholecystic fat stranding biliary dilatation. Pancreas: No ductal dilatation or inflammation. Spleen: Normal in size without focal abnormality. Adrenals/Urinary Tract: Normal adrenal glands. No hydronephrosis or perinephric edema. No renal calculi or focal renal abnormality. Homogeneous enhancement with symmetric excretion on delayed phase  imaging. Urinary bladder is partially distended. Stomach/Bowel: Periumbilical hernia just to the left of midline contains moderate length segment of small bowel. The bowel within the hernia and proximally are dilated and fluid-filled. The exiting small bowel and more distal bowel loops are decompressed. Slight edematous appearance of a dilated small bowel loops in the central abdomen with wall thickening and mesenteric edema. No pneumatosis. Stomach is mildly distended. Normal appendix. Small to moderate colonic stool burden. Vascular/Lymphatic: Mild aortic and branch atherosclerosis. No aortic aneurysm. Patent portal vein. No enlarged lymph nodes in the abdomen or pelvis. Reproductive: Prostate is unremarkable. Other: Periumbilical hernia to the left of midline, moderate in size containing moderate length of small bowel with subsequent small-bowel obstruction. Mild mesenteric edema with trace mesenteric free fluid, and free fluid tracking into the pelvis. No perforation or abscess. Minimal fat in the left greater than right inguinal canal. Musculoskeletal: There are no acute or suspicious osseous abnormalities. Stable bone island in the right iliac bone. There is degenerative change involving the spine and both hips. IMPRESSION: 1. Small-bowel obstruction with transition point within periumbilical hernia extending to the left  of midline. Mild mesenteric edema with trace mesenteric free fluid, and free fluid tracking into the pelvis. No perforation or abscess. 2. Hepatomegaly and hepatic steatosis. 3. Possible gallstone without gallbladder inflammation. Aortic Atherosclerosis (ICD10-I70.0). Electronically Signed   By: Narda Rutherford M.D.   On: 01/14/2021 23:35   DG Abdomen Acute W/Chest  Result Date: 01/14/2021 CLINICAL DATA:  Abdominal pain, hernia, small-bowel obstruction EXAM: DG ABDOMEN ACUTE WITH 1 VIEW CHEST COMPARISON:  02/11/2018 FINDINGS: Lungs are clear. No pneumothorax or pleural effusion. Cardiac size within normal limits. Multiple gas-filled dilated loops of small bowel are seen within the left mid abdomen. Gas is seen within multiple nondilated loops of distal small bowel within the right lower quadrant as well as throughout the colon and, together, the findings suggest changes of a partial or developing complete small bowel obstruction. There is no free intraperitoneal gas. No organomegaly. No abnormal calcifications within the abdomen and pelvis. Degenerative changes are noted within the hips bilaterally IMPRESSION: Partial or developing complete mid small bowel obstruction Electronically Signed   By: Helyn Numbers M.D.   On: 01/14/2021 23:17    Assessment/Plan Principal Problem:   SBO (small bowel obstruction) Mr. Parenteau is admitted to Med-Surg floor.  Patient with umbilical hernia that was reduced in the emergency room.  Continues to have obstruction with transition point in the periumbilical hernia region.  General surgery been consulted with Dr. Carolynne Edouard who will see patient in the morning. IV fluid hydration with LR at 125 ml/hr Pain control with Dilaudid provided.  Zofran for nausea.  Patient is currently not had any vomiting in the the emergency room but if he were to resume vomiting will place NG tube for bowel decompression  Active Problems:   Essential hypertension Resume Hyzaar for BP control once  diet is resumed after resolution of SBO.  Will be given IV dose of hydralazine if needed for systolic blood pressure greater than 180.  Importance of compliance with antihypertensive and diabetic medications reviewed with patient and his wife    Diabetes mellitus type 2 in obese Patient blood sugar is controlled at 128 currently.  We will monitor blood sugars with labs in the morning.  Check hemoglobin A1c.  We will make further recommendations on treatment once A1c level 11    Obesity, Class III, BMI 40-49.9 (morbid obesity)  Patient to follow-up with PCP  for dietary, lifestyle, pharmacotherapy interventions for long-term weight loss.  May also consider referral to bariatric surgery for more definitive weight loss     DVT prophylaxis: Padua score low. Early ambulation for DVT prophylaxis.   Code Status:   Full Code  Family Communication:  Diagnosis and plan discussed with patient and his wife who is at the bedside.  Questions answered.  They verbalized understanding agree with plan.  Further recommendations to follow as clinical indicated Disposition Plan:   Patient is from:  Home  Anticipated DC to:  Home  Anticipated DC date:  Anticipate 2 midnight or more stay in the hospital  Consults called:  General Surgery, Dr. Carolynne Edouard will see Mr. Goulette in am.  Admission status:  Inpatient  Claudean Severance Nikolay Demetriou MD [facility]  How to contact the [facility] Attending or Consulting provider 7A - 7P or covering provider during after hours 7P -7A, for this patient?   Check the care team in [facility] and look for a) attending/consulting [facility] provider listed and b) the [facility] team listed Log into [email or web address] and use [place]'s universal password to access. If you do not have the password, please contact the hospital operator. Locate the [facility] provider you are looking for under [facility] and page to a number that you can be directly reached. If you still have difficulty reaching the provider, please  page the [facility] (Director on Call) for the Hospitalists listed on amion for assistance.  01/15/2021, 3:04 AM

## 2021-01-15 NOTE — Progress Notes (Signed)
Verbal order received from Dr. Freida Busman for clear fluids. Will follow up in am.

## 2021-01-16 DIAGNOSIS — I10 Essential (primary) hypertension: Secondary | ICD-10-CM

## 2021-01-16 LAB — GLUCOSE, CAPILLARY
Glucose-Capillary: 126 mg/dL — ABNORMAL HIGH (ref 70–99)
Glucose-Capillary: 109 mg/dL — ABNORMAL HIGH (ref 70–99)
Glucose-Capillary: 158 mg/dL — ABNORMAL HIGH (ref 70–99)

## 2021-01-16 LAB — CBC WITH DIFFERENTIAL/PLATELET
Abs Immature Granulocytes: 0.02 10*3/uL (ref 0.00–0.07)
Basophils Absolute: 0 10*3/uL (ref 0.0–0.1)
Basophils Relative: 0 %
Eosinophils Absolute: 0.3 10*3/uL (ref 0.0–0.5)
Eosinophils Relative: 3 %
HCT: 37 % — ABNORMAL LOW (ref 39.0–52.0)
Hemoglobin: 11.4 g/dL — ABNORMAL LOW (ref 13.0–17.0)
Immature Granulocytes: 0 %
Lymphocytes Relative: 36 %
Lymphs Abs: 3.9 10*3/uL (ref 0.7–4.0)
MCH: 26.3 pg (ref 26.0–34.0)
MCHC: 30.8 g/dL (ref 30.0–36.0)
MCV: 85.3 fL (ref 80.0–100.0)
Monocytes Absolute: 0.7 10*3/uL (ref 0.1–1.0)
Monocytes Relative: 6 %
Neutro Abs: 6 10*3/uL (ref 1.7–7.7)
Neutrophils Relative %: 55 %
Platelets: 267 10*3/uL (ref 150–400)
RBC: 4.34 MIL/uL (ref 4.22–5.81)
RDW: 14.5 % (ref 11.5–15.5)
WBC: 10.9 10*3/uL — ABNORMAL HIGH (ref 4.0–10.5)
nRBC: 0 % (ref 0.0–0.2)

## 2021-01-16 LAB — BASIC METABOLIC PANEL
Anion gap: 8 (ref 5–15)
BUN: 9 mg/dL (ref 6–20)
CO2: 29 mmol/L (ref 22–32)
Calcium: 8.4 mg/dL — ABNORMAL LOW (ref 8.9–10.3)
Chloride: 102 mmol/L (ref 98–111)
Creatinine, Ser: 0.92 mg/dL (ref 0.61–1.24)
GFR, Estimated: >60 mL/min (ref >60)
Glucose, Bld: 105 mg/dL — ABNORMAL HIGH (ref 70–99)
Potassium: 3.6 mmol/L (ref 3.5–5.1)
Sodium: 139 mmol/L (ref 135–145)

## 2021-01-16 LAB — MAGNESIUM: Magnesium: 1.7 mg/dL (ref 1.7–2.4)

## 2021-01-16 MED ORDER — INSULIN ASPART 100 UNIT/ML IJ SOLN: 0.0000 [IU] | Freq: Every day | INTRAMUSCULAR | Status: DC

## 2021-01-16 MED ORDER — INSULIN ASPART 100 UNIT/ML IJ SOLN
0.0000 [IU] | Freq: Three times a day (TID) | INTRAMUSCULAR | Status: DC
  Administered 2021-01-16: 3 [IU] via SUBCUTANEOUS

## 2021-01-16 NOTE — Progress Notes (Signed)
Patient ID: Frank Howe, male   DOB: Sep 06, 1975, 45 y.o.   MRN: [id number]  PROGRESS NOTE    Frank Howe  JKK:[id number] DOB: 10/25/1975 DOA: 01/14/2021 PCP: Dois Davenport, MD   Brief Narrative:  45 year old male with history of hypertension, diabetes mellitus type 2, morbid obesity presented with abdominal pain, nausea and vomiting.  On presentation, he had a small umbilical hernia that was partially reduced in the emergency room; CT of the abdomen showed small bowel obstruction with transition point in the periumbilical region.  WBCs of 13,600.  General surgery recommended admission to hospitalist service.  Assessment & Plan:   Small bowel obstruction secondary to an umbilical hernia -General surgery is following. -Had bowel movement this morning.  Diet advancement as presented surgery.  Patient interested in having surgery for his recurrent hernia while he is in the hospital. -Decrease current IV fluids to 75 cc an hour.  Continue pain management  Essential hypertension -Blood pressure intermittently elevated.  Use IV antihypertensives if needed.  Hyzaar currently on hold.  Leukocytosis -Reactive.  Resolved  Diabetes mellitus type 2 -A1c 6.8.  We will start CBGs with SSI.  Morbid obesity -Outpatient follow-up     DVT prophylaxis: Early ambulation Code Status: Full Family Communication: Wife at bedside Disposition Plan: Status is: Inpatient  Remains inpatient appropriate because:Inpatient level of care appropriate due to severity of illness  Dispo: The patient is from: Home              Anticipated d/c is to: Home              Patient currently is not medically stable to d/c.   Difficult to place patient No  Consultants: General surgery  Procedures: None  Antimicrobials: None   Subjective: Patient seen and examined at bedside.  Had a bowel movement this morning.  Denies worsening abdominal pain.  Wants to have surgery done while he is in the  hospital.  Objective: Vitals:   01/15/21 2119 01/16/21 0100 01/16/21 0540 01/16/21 1059  BP: 134/70 (!) 154/85 (!) 147/82 (!) 173/101  Pulse: 73 73 67 79  Resp: 19 19 17 18   Temp: 98.2 F (36.8 C) 98.7 F (37.1 C) 98.1 F (36.7 C) (!) 97.5 F (36.4 C)  TempSrc: Oral Oral    SpO2: 94% 90% 94% 95%  Weight:      Height:        Intake/Output Summary (Last 24 hours) at 01/16/2021 1103 Last data filed at 01/16/2021 0600 Gross per 24 hour  Intake 3683.61 ml  Output 1225 ml  Net 2458.61 ml   Filed Weights   01/14/21 2203  Weight: (!) 145.2 kg    Examination:  General exam: Appears calm and comfortable.  Currently on room air. Respiratory system: Bilateral decreased breath sounds at bases Cardiovascular system: S1 & S2 heard, Rate controlled Gastrointestinal system: Abdomen is morbidly obese, distended, soft, umbilical hernia present.  Bowel sounds slightly heard. Extremities: No cyanosis, clubbing; trace lower extremity edema   Data Reviewed: I have personally reviewed following labs and imaging studies  CBC: Recent Labs  Lab 01/14/21 2225 01/15/21 0330 01/16/21 0403  WBC 13.6* 14.6* 10.9*  NEUTROABS  --   --  6.0  HGB 12.7* 12.3* 11.4*  HCT 41.0 40.0 37.0*  MCV 85.2 86.0 85.3  PLT 282 296 267   Basic Metabolic Panel: Recent Labs  Lab 01/14/21 2225 01/15/21 0330 01/16/21 0403  NA 137 142 139  K 3.5 4.4 3.6  CL 99  104 102  CO2 28 31 29   GLUCOSE 120* 115* 105*  BUN 10 10 9   CREATININE 1.11 1.11 0.92  CALCIUM 8.6* 8.8* 8.4*  MG  --   --  1.7   GFR: Estimated Creatinine Clearance: 143.6 mL/min (by C-G formula based on SCr of 0.92 mg/dL). Liver Function Tests: Recent Labs  Lab 01/14/21 2225  AST 19  ALT 25  ALKPHOS 83  BILITOT 0.8  PROT 7.5  ALBUMIN 3.4*   Recent Labs  Lab 01/14/21 2225  LIPASE 26   No results for input(s): AMMONIA in the last 168 hours. Coagulation Profile: No results for input(s): INR, PROTIME in the last 168  hours. Cardiac Enzymes: No results for input(s): CKTOTAL, CKMB, CKMBINDEX, TROPONINI in the last 168 hours. BNP (last 3 results) No results for input(s): PROBNP in the last 8760 hours. HbA1C: Recent Labs    01/15/21 0330  HGBA1C 6.8*   CBG: No results for input(s): GLUCAP in the last 168 hours. Lipid Profile: No results for input(s): CHOL, HDL, LDLCALC, TRIG, CHOLHDL, LDLDIRECT in the last 72 hours. Thyroid Function Tests: No results for input(s): TSH, T4TOTAL, FREET4, T3FREE, THYROIDAB in the last 72 hours. Anemia Panel: No results for input(s): VITAMINB12, FOLATE, FERRITIN, TIBC, IRON, RETICCTPCT in the last 72 hours. Sepsis Labs: No results for input(s): PROCALCITON, LATICACIDVEN in the last 168 hours.  Recent Results (from the past 240 hour(s))  SARS CORONAVIRUS 2 (TAT 6-24 HRS) Nasopharyngeal Nasopharyngeal Swab     Status: None   Collection Time: 01/15/21 12:38 AM   Specimen: Nasopharyngeal Swab  Result Value Ref Range Status   SARS Coronavirus 2 NEGATIVE NEGATIVE Final    Comment: (NOTE) SARS-CoV-2 target nucleic acids are NOT DETECTED.  The SARS-CoV-2 RNA is generally detectable in upper and lower respiratory specimens during the acute phase of infection. Negative results do not preclude SARS-CoV-2 infection, do not rule out co-infections with other pathogens, and should not be used as the sole basis for treatment or other patient management decisions. Negative results must be combined with clinical observations, patient history, and epidemiological information. The expected result is Negative.  Fact Sheet for Patients: 01/17/21  Fact Sheet for Healthcare Providers: 01/17/21  This test is not yet approved or cleared by the [employer] FDA and  has been authorized for detection and/or diagnosis of SARS-CoV-2 by FDA under an Emergency Use Authorization (EUA). This EUA will remain  in effect  (meaning this test can be used) for the duration of the COVID-19 declaration under Se ction 564(b)(1) of the Act, 21 U.S.C. section 360bbb-3(b)(1), unless the authorization is terminated or revoked sooner.  Performed at [facility], [address] [place], [address] [place]          Radiology Studies: CT ABDOMEN PELVIS W CONTRAST  Result Date: 01/14/2021 CLINICAL DATA:  Complicated hernia. Patient reports left umbilical hernia, increasing in size. EXAM: CT ABDOMEN AND PELVIS WITH CONTRAST TECHNIQUE: Multidetector CT imaging of the abdomen and pelvis was performed using the standard protocol following bolus administration of intravenous contrast. CONTRAST:  75mL OMNIPAQUE IOHEXOL 350 MG/ML SOLN COMPARISON:  CT 02/11/2018 FINDINGS: Lower chest: Normal heart size. No acute airspace disease or pleural effusion. Hepatobiliary: Diffusely decreased hepatic density consistent with steatosis. The liver is enlarged spanning 21 cm cranial caudal. No focal hepatic lesion is seen. Possible gallstone without abnormal gallbladder distention or pericholecystic fat stranding biliary dilatation. Pancreas: No ductal dilatation or inflammation. Spleen: Normal in size without focal abnormality. Adrenals/Urinary Tract:  Normal adrenal glands. No hydronephrosis or perinephric edema. No renal calculi or focal renal abnormality. Homogeneous enhancement with symmetric excretion on delayed phase imaging. Urinary bladder is partially distended. Stomach/Bowel: Periumbilical hernia just to the left of midline contains moderate length segment of small bowel. The bowel within the hernia and proximally are dilated and fluid-filled. The exiting small bowel and more distal bowel loops are decompressed. Slight edematous appearance of a dilated small bowel loops in the central abdomen with wall thickening and mesenteric edema. No pneumatosis. Stomach is mildly distended. Normal appendix. Small to moderate colonic stool burden.  Vascular/Lymphatic: Mild aortic and branch atherosclerosis. No aortic aneurysm. Patent portal vein. No enlarged lymph nodes in the abdomen or pelvis. Reproductive: Prostate is unremarkable. Other: Periumbilical hernia to the left of midline, moderate in size containing moderate length of small bowel with subsequent small-bowel obstruction. Mild mesenteric edema with trace mesenteric free fluid, and free fluid tracking into the pelvis. No perforation or abscess. Minimal fat in the left greater than right inguinal canal. Musculoskeletal: There are no acute or suspicious osseous abnormalities. Stable bone island in the right iliac bone. There is degenerative change involving the spine and both hips. IMPRESSION: 1. Small-bowel obstruction with transition point within periumbilical hernia extending to the left of midline. Mild mesenteric edema with trace mesenteric free fluid, and free fluid tracking into the pelvis. No perforation or abscess. 2. Hepatomegaly and hepatic steatosis. 3. Possible gallstone without gallbladder inflammation. Aortic Atherosclerosis (ICD10-I70.0). Electronically Signed   By: Narda Rutherford M.D.   On: 01/14/2021 23:35   DG Abdomen Acute W/Chest  Result Date: 01/14/2021 CLINICAL DATA:  Abdominal pain, hernia, small-bowel obstruction EXAM: DG ABDOMEN ACUTE WITH 1 VIEW CHEST COMPARISON:  02/11/2018 FINDINGS: Lungs are clear. No pneumothorax or pleural effusion. Cardiac size within normal limits. Multiple gas-filled dilated loops of small bowel are seen within the left mid abdomen. Gas is seen within multiple nondilated loops of distal small bowel within the right lower quadrant as well as throughout the colon and, together, the findings suggest changes of a partial or developing complete small bowel obstruction. There is no free intraperitoneal gas. No organomegaly. No abnormal calcifications within the abdomen and pelvis. Degenerative changes are noted within the hips bilaterally IMPRESSION:  Partial or developing complete mid small bowel obstruction Electronically Signed   By: Helyn Numbers M.D.   On: 01/14/2021 23:17        Scheduled Meds: Continuous Infusions:  lactated ringers 125 mL/hr at 01/16/21 0834          Glade Lloyd, MD [facility] 01/16/2021, 11:03 AM

## 2021-01-16 NOTE — Progress Notes (Signed)
Subjective: Had a BM this morning. Tolerating clear liquids. Afebrile, vitals stable.    Objective: Vital signs in last 24 hours: Temp:  [97.7 F (36.5 C)-98.8 F (37.1 C)] 98.1 F (36.7 C) (09/25 0540) Pulse Rate:  [67-77] 67 (09/25 0540) Resp:  [16-19] 17 (09/25 0540) BP: (134-161)/(70-93) 147/82 (09/25 0540) SpO2:  [90 %-94 %] 94 % (09/25 0540) Last BM Date: 01/15/21  Intake/Output from previous day: 09/24 0701 - 09/25 0700 In: 3683.6 [P.O.:720; I.V.:2963.6] Out: 1225 [Urine:1225] Intake/Output this shift: No intake/output data recorded.  PE: General: resting comfortably, NAD Neuro: alert and oriented, no focal deficits Resp: normal work of breathing on room air Abdomen: soft, nondistended, nontender to palpation. Umbilical hernia is partially reducible but soft and nontender, no overlying skin changes.   Lab Results:  Recent Labs    01/15/21 0330 01/16/21 0403  WBC 14.6* 10.9*  HGB 12.3* 11.4*  HCT 40.0 37.0*  PLT 296 267   BMET Recent Labs    01/15/21 0330 01/16/21 0403  NA 142 139  K 4.4 3.6  CL 104 102  CO2 31 29  GLUCOSE 115* 105*  BUN 10 9  CREATININE 1.11 0.92  CALCIUM 8.8* 8.4*   PT/INR No results for input(s): LABPROT, INR in the last 72 hours. CMP     Component Value Date/Time   NA 139 01/16/2021 0403   NA 140 02/13/2019 1112   K 3.6 01/16/2021 0403   CL 102 01/16/2021 0403   CO2 29 01/16/2021 0403   GLUCOSE 105 (H) 01/16/2021 0403   BUN 9 01/16/2021 0403   BUN 10 02/13/2019 1112   CREATININE 0.92 01/16/2021 0403   CREATININE 1.11 12/16/2012 1424   CALCIUM 8.4 (L) 01/16/2021 0403   PROT 7.5 01/14/2021 2225   PROT 6.9 02/13/2019 1112   ALBUMIN 3.4 (L) 01/14/2021 2225   ALBUMIN 3.8 (L) 02/13/2019 1112   AST 19 01/14/2021 2225   ALT 25 01/14/2021 2225   ALKPHOS 83 01/14/2021 2225   BILITOT 0.8 01/14/2021 2225   BILITOT 0.5 02/13/2019 1112   GFRNONAA >60 01/16/2021 0403   GFRAA >60 03/26/2019 2111   Lipase      Component Value Date/Time   LIPASE 26 01/14/2021 2225       Studies/Results: CT ABDOMEN PELVIS W CONTRAST  Result Date: 01/14/2021 CLINICAL DATA:  Complicated hernia. Patient reports left umbilical hernia, increasing in size. EXAM: CT ABDOMEN AND PELVIS WITH CONTRAST TECHNIQUE: Multidetector CT imaging of the abdomen and pelvis was performed using the standard protocol following bolus administration of intravenous contrast. CONTRAST:  37mL OMNIPAQUE IOHEXOL 350 MG/ML SOLN COMPARISON:  CT 02/11/2018 FINDINGS: Lower chest: Normal heart size. No acute airspace disease or pleural effusion. Hepatobiliary: Diffusely decreased hepatic density consistent with steatosis. The liver is enlarged spanning 21 cm cranial caudal. No focal hepatic lesion is seen. Possible gallstone without abnormal gallbladder distention or pericholecystic fat stranding biliary dilatation. Pancreas: No ductal dilatation or inflammation. Spleen: Normal in size without focal abnormality. Adrenals/Urinary Tract: Normal adrenal glands. No hydronephrosis or perinephric edema. No renal calculi or focal renal abnormality. Homogeneous enhancement with symmetric excretion on delayed phase imaging. Urinary bladder is partially distended. Stomach/Bowel: Periumbilical hernia just to the left of midline contains moderate length segment of small bowel. The bowel within the hernia and proximally are dilated and fluid-filled. The exiting small bowel and more distal bowel loops are decompressed. Slight edematous appearance of a dilated small bowel loops in the central abdomen with wall  thickening and mesenteric edema. No pneumatosis. Stomach is mildly distended. Normal appendix. Small to moderate colonic stool burden. Vascular/Lymphatic: Mild aortic and branch atherosclerosis. No aortic aneurysm. Patent portal vein. No enlarged lymph nodes in the abdomen or pelvis. Reproductive: Prostate is unremarkable. Other: Periumbilical hernia to the left of  midline, moderate in size containing moderate length of small bowel with subsequent small-bowel obstruction. Mild mesenteric edema with trace mesenteric free fluid, and free fluid tracking into the pelvis. No perforation or abscess. Minimal fat in the left greater than right inguinal canal. Musculoskeletal: There are no acute or suspicious osseous abnormalities. Stable bone island in the right iliac bone. There is degenerative change involving the spine and both hips. IMPRESSION: 1. Small-bowel obstruction with transition point within periumbilical hernia extending to the left of midline. Mild mesenteric edema with trace mesenteric free fluid, and free fluid tracking into the pelvis. No perforation or abscess. 2. Hepatomegaly and hepatic steatosis. 3. Possible gallstone without gallbladder inflammation. Aortic Atherosclerosis (ICD10-I70.0). Electronically Signed   By: Narda Rutherford M.D.   On: 01/14/2021 23:35   DG Abdomen Acute W/Chest  Result Date: 01/14/2021 CLINICAL DATA:  Abdominal pain, hernia, small-bowel obstruction EXAM: DG ABDOMEN ACUTE WITH 1 VIEW CHEST COMPARISON:  02/11/2018 FINDINGS: Lungs are clear. No pneumothorax or pleural effusion. Cardiac size within normal limits. Multiple gas-filled dilated loops of small bowel are seen within the left mid abdomen. Gas is seen within multiple nondilated loops of distal small bowel within the right lower quadrant as well as throughout the colon and, together, the findings suggest changes of a partial or developing complete small bowel obstruction. There is no free intraperitoneal gas. No organomegaly. No abnormal calcifications within the abdomen and pelvis. Degenerative changes are noted within the hips bilaterally IMPRESSION: Partial or developing complete mid small bowel obstruction Electronically Signed   By: Helyn Numbers M.D.   On: 01/14/2021 23:17    Anti-infectives: Anti-infectives (From admission, onward)    None         Assessment/Plan 45 yo male presenting with SBO secondary to an umbilical hernia, now reduced with resolution of obstructive symptoms. Will advance diet today. Patient had initially wanted outpatient follow up, however today he says he would like to have this repaired this admission. His hernia has recurred twice now, and he remains at very high risk for recurrence due to his high BMI and comorbid diabetes. He is aware of these risk factors. However since he presented with a bowel obstruction, he may benefit from repair to prevent further complications such as ischemia. Will allow diet today, make NPO at midnight and consider operative repair this week.     LOS: 1 day    Sophronia Simas, MD [facility] Surgery General, Hepatobiliary and Pancreatic Surgery 01/16/21 10:53 AM

## 2021-01-16 NOTE — Progress Notes (Signed)
Per PT request, placed PT on BiPAP at approx 1540.

## 2021-01-17 DIAGNOSIS — M109 Gout, unspecified: Secondary | ICD-10-CM

## 2021-01-17 DIAGNOSIS — E669 Obesity, unspecified: Secondary | ICD-10-CM

## 2021-01-17 DIAGNOSIS — E1169 Type 2 diabetes mellitus with other specified complication: Secondary | ICD-10-CM

## 2021-01-17 LAB — GLUCOSE, CAPILLARY
Glucose-Capillary: 90 mg/dL (ref 70–99)
Glucose-Capillary: 81 mg/dL (ref 70–99)
Glucose-Capillary: 101 mg/dL — ABNORMAL HIGH (ref 70–99)

## 2021-01-17 MED ORDER — INDOMETHACIN 50 MG PO CAPS
50.0000 mg | ORAL_CAPSULE | Freq: Three times a day (TID) | ORAL | 0 refills | Status: DC
Start: 2021-01-17 — End: 2021-11-14

## 2021-01-17 MED ORDER — COLCHICINE 0.6 MG PO TABS: 0.6000 mg | ORAL_TABLET | Freq: Every day | ORAL | 0 refills | Status: AC | PRN

## 2021-01-17 MED ORDER — ONDANSETRON HCL 4 MG PO TABS: 4.0000 mg | ORAL_TABLET | Freq: Four times a day (QID) | ORAL | 0 refills | Status: DC | PRN

## 2021-01-17 MED ORDER — COLCHICINE 0.6 MG PO TABS
1.2000 mg | ORAL_TABLET | Freq: Once | ORAL | Status: AC
  Administered 2021-01-17: 1.2 mg via ORAL
  Filled 2021-01-17: qty 2

## 2021-01-17 NOTE — Progress Notes (Signed)
Assessment & Plan: HD#4 - recurrent ventral incisional hernia, reduced; small bowel obstruction, resolving  Reviewed records from 2019 - two laparoscopic repairs with mesh, Drs. [employer]  Now with recurrence - reduced by Dr. Carolynne Edouard with resolving SBO   I discussed with Dr. Ivar Drape who has expertise in complex hernia repairs.  Will require robotic repair with removal of mesh.  He is on vacation this week but will see patient in office to evaluate and discuss robotic repair.  I will discuss with family and offer this service.  Will restart diet today with plans for discharge and office consult with Dr. Dossie Der in near future.        Darnell Level, MD       [facility]       Office: [phone]   Chief Complaint: Recurrent ventral incisional hernia  Subjective: Patient in bed, CPAP on, family at bedside, wife on speaker phone.  No complaints.  Objective: Vital signs in last 24 hours: Temp:  [97.5 F (36.4 C)-97.7 F (36.5 C)] 97.7 F (36.5 C) (09/25 1401) Pulse Rate:  [65-81] 65 (09/26 0454) Resp:  [18-19] 18 (09/26 0454) BP: (146-173)/(81-102) 146/81 (09/26 0454) SpO2:  [94 %-96 %] 96 % (09/26 0454) Last BM Date: 01/17/21  Intake/Output from previous day: 09/25 0701 - 09/26 0700 In: 2289.2 [P.O.:240; I.V.:2049.2] Out: 1700 [Urine:1700] Intake/Output this shift: Total I/O In: -  Out: 500 [Urine:500]  Physical Exam: HEENT - sclerae clear, mucous membranes moist Neck - soft Abdomen - obese, visible bulge to left of umbilicus, well healed surgical wounds; bulge is soft, non-tender, partially reducible Neuro - alert & oriented, no focal deficits  Lab Results:  Recent Labs    01/15/21 0330 01/16/21 0403  WBC 14.6* 10.9*  HGB 12.3* 11.4*  HCT 40.0 37.0*  PLT 296 267   BMET Recent Labs    01/15/21 0330 01/16/21 0403  NA 142 139  K 4.4 3.6  CL 104 102  CO2 31 29  GLUCOSE 115* 105*  BUN 10 9  CREATININE 1.11 0.92   CALCIUM 8.8* 8.4*   PT/INR No results for input(s): LABPROT, INR in the last 72 hours. Comprehensive Metabolic Panel:    Component Value Date/Time   NA 139 01/16/2021 0403   NA 142 01/15/2021 0330   NA 140 02/13/2019 1112   NA 142 11/25/2018 1409   K 3.6 01/16/2021 0403   K 4.4 01/15/2021 0330   CL 102 01/16/2021 0403   CL 104 01/15/2021 0330   CO2 29 01/16/2021 0403   CO2 31 01/15/2021 0330   BUN 9 01/16/2021 0403   BUN 10 01/15/2021 0330   BUN 10 02/13/2019 1112   BUN 10 11/25/2018 1409   CREATININE 0.92 01/16/2021 0403   CREATININE 1.11 01/15/2021 0330   CREATININE 1.11 12/16/2012 1424   GLUCOSE 105 (H) 01/16/2021 0403   GLUCOSE 115 (H) 01/15/2021 0330   CALCIUM 8.4 (L) 01/16/2021 0403   CALCIUM 8.8 (L) 01/15/2021 0330   AST 19 01/14/2021 2225   AST 26 02/04/2020 1554   ALT 25 01/14/2021 2225   ALT 31 02/04/2020 1554   ALKPHOS 83 01/14/2021 2225   ALKPHOS 95 02/04/2020 1554   BILITOT 0.8 01/14/2021 2225   BILITOT 0.7 02/04/2020 1554   BILITOT 0.5 02/13/2019 1112   BILITOT 0.7 11/25/2018 1409   PROT 7.5 01/14/2021 2225   PROT 7.7 02/04/2020 1554   PROT 6.9 02/13/2019 1112   PROT 6.9 11/25/2018 1409  ALBUMIN 3.4 (L) 01/14/2021 2225   ALBUMIN 3.6 02/04/2020 1554   ALBUMIN 3.8 (L) 02/13/2019 1112   ALBUMIN 3.7 (L) 11/25/2018 1409    Studies/Results: No results found.    Darnell Level 01/17/2021   Patient ID: Frank Howe, male   DOB: May 25, 1975, 45 y.o.   MRN: [id number]

## 2021-01-17 NOTE — Discharge Summary (Signed)
Physician Discharge Summary  Frank Howe ION:[id number] DOB: [birth year] DOA: 01/14/2021  PCP: Dois Davenport, MD  Admit date: 01/14/2021 Discharge date: 01/17/2021  Admitted From: Home Disposition: Home  Recommendations for Outpatient Follow-up:  Follow up with PCP in 1 week with repeat CBC/BMP Outpatient follow-up with general surgery Follow up in ED if symptoms worsen or new appear   Home Health: No Equipment/Devices: None  Discharge Condition: Stable CODE STATUS: Full Diet recommendation: Heart healthy/carb modified  Brief/Interim Summary: 45 year old male with history of hypertension, diabetes mellitus type 2, morbid obesity presented with abdominal pain, nausea and vomiting.  On presentation, he had a small umbilical hernia that was partially reduced in the emergency room; CT of the abdomen showed small bowel obstruction with transition point in the periumbilical region.  WBCs of 13,600.  General surgery recommended admission to hospitalist service.  He was managed conservatively.  During the hospitalization, his condition has improved.  He is currently having bowel movements and tolerating diet.  General surgery cleared the patient for discharge.  Discharge patient home today with outpatient follow-up with general surgery.  Discharge Diagnoses:   Small bowel obstruction secondary to an umbilical hernia -General surgery following: Patient was managed conservatively. - During the hospitalization, his condition has improved.  He is currently having bowel movements and tolerating diet.  General surgery cleared the patient for discharge.  Discharge patient home today with outpatient follow-up with general surgery.   Essential hypertension -Blood pressure intermittently elevated.  -Outpatient follow-up.  Patient apparently is not on any antihypertensives at home.  Outpatient follow-up with PCP.  Leukocytosis -Reactive.  Resolved   Diabetes mellitus type 2 -A1c 6.8.  Carb  modified diet.  Outpatient follow-up with PCP regarding needs to start meds.  Patient apparently not on any meds at this time.   Possible acute gout flare -Complains of left hand and right foot pain, similar to prior gout flare.  Uses as needed colchicine for gout flares at home.   -Given 1 dose of colchicine today.  Will discharge on as needed colchicine along with short course of indomethacin.  Follow-up with PCP regarding needs to start allopurinol.    Morbid obesity -Outpatient follow-up   Discharge Instructions  Discharge Instructions     Diet - low sodium heart healthy   Complete by: As directed    Diet Carb Modified   Complete by: As directed    Increase activity slowly   Complete by: As directed       Allergies as of 01/17/2021   No Known Allergies      Medication List     STOP taking these medications    dicyclomine 20 MG tablet Commonly known as: BENTYL   lansoprazole 30 MG disintegrating tablet Commonly known as: PREVACID SOLUTAB   lisinopril-hydrochlorothiazide 10-12.5 MG tablet Commonly known as: ZESTORETIC   losartan-hydrochlorothiazide 100-12.5 MG tablet Commonly known as: HYZAAR   meloxicam 15 MG tablet Commonly known as: MOBIC   metFORMIN 750 MG 24 hr tablet Commonly known as: GLUCOPHAGE-XR   naproxen 500 MG tablet Commonly known as: NAPROSYN   ondansetron 4 MG disintegrating tablet Commonly known as: Zofran ODT   predniSONE 10 MG (21) Tbpk tablet Commonly known as: STERAPRED UNI-PAK 21 TAB   Vitamin D (Ergocalciferol) 1.25 MG (50000 UNIT) Caps capsule Commonly known as: DRISDOL       TAKE these medications    colchicine 0.6 MG tablet Take 1 tablet (0.6 mg total) by mouth daily as needed (gout flare).  indomethacin 50 MG capsule Commonly known as: INDOCIN Take 1 capsule (50 mg total) by mouth 3 (three) times daily with meals.   ondansetron 4 MG tablet Commonly known as: ZOFRAN Take 1 tablet (4 mg total) by mouth every 6  (six) hours as needed for nausea.   PRESCRIPTION MEDICATION Gout medication, unknown name        Follow-up Information     Stechschulte, Hyman Hopes, MD. Call.   Specialty: Surgery Why: Please call to confirm your follow up appointment time. Contact information: [address] [place] [address] [phone]         Dois Davenport, MD. Schedule an appointment as soon as possible for a visit in 1 week(s).   Specialty: Family Medicine Why: with repeat cbc/bmp Contact information: [address] [place] [address] [phone]                No Known Allergies  Consultations: General surgery   Procedures/Studies: CT ABDOMEN PELVIS W CONTRAST  Result Date: 01/14/2021 CLINICAL DATA:  Complicated hernia. Patient reports left umbilical hernia, increasing in size. EXAM: CT ABDOMEN AND PELVIS WITH CONTRAST TECHNIQUE: Multidetector CT imaging of the abdomen and pelvis was performed using the standard protocol following bolus administration of intravenous contrast. CONTRAST:  91mL OMNIPAQUE IOHEXOL 350 MG/ML SOLN COMPARISON:  CT 02/11/2018 FINDINGS: Lower chest: Normal heart size. No acute airspace disease or pleural effusion. Hepatobiliary: Diffusely decreased hepatic density consistent with steatosis. The liver is enlarged spanning 21 cm cranial caudal. No focal hepatic lesion is seen. Possible gallstone without abnormal gallbladder distention or pericholecystic fat stranding biliary dilatation. Pancreas: No ductal dilatation or inflammation. Spleen: Normal in size without focal abnormality. Adrenals/Urinary Tract: Normal adrenal glands. No hydronephrosis or perinephric edema. No renal calculi or focal renal abnormality. Homogeneous enhancement with symmetric excretion on delayed phase imaging. Urinary bladder is partially distended. Stomach/Bowel: Periumbilical hernia just to the left of midline contains moderate length segment of small bowel. The  bowel within the hernia and proximally are dilated and fluid-filled. The exiting small bowel and more distal bowel loops are decompressed. Slight edematous appearance of a dilated small bowel loops in the central abdomen with wall thickening and mesenteric edema. No pneumatosis. Stomach is mildly distended. Normal appendix. Small to moderate colonic stool burden. Vascular/Lymphatic: Mild aortic and branch atherosclerosis. No aortic aneurysm. Patent portal vein. No enlarged lymph nodes in the abdomen or pelvis. Reproductive: Prostate is unremarkable. Other: Periumbilical hernia to the left of midline, moderate in size containing moderate length of small bowel with subsequent small-bowel obstruction. Mild mesenteric edema with trace mesenteric free fluid, and free fluid tracking into the pelvis. No perforation or abscess. Minimal fat in the left greater than right inguinal canal. Musculoskeletal: There are no acute or suspicious osseous abnormalities. Stable bone island in the right iliac bone. There is degenerative change involving the spine and both hips. IMPRESSION: 1. Small-bowel obstruction with transition point within periumbilical hernia extending to the left of midline. Mild mesenteric edema with trace mesenteric free fluid, and free fluid tracking into the pelvis. No perforation or abscess. 2. Hepatomegaly and hepatic steatosis. 3. Possible gallstone without gallbladder inflammation. Aortic Atherosclerosis (ICD10-I70.0). Electronically Signed   By: Narda Rutherford M.D.   On: 01/14/2021 23:35   DG Abdomen Acute W/Chest  Result Date: 01/14/2021 CLINICAL DATA:  Abdominal pain, hernia, small-bowel obstruction EXAM: DG ABDOMEN ACUTE WITH 1 VIEW CHEST COMPARISON:  02/11/2018 FINDINGS: Lungs are clear. No pneumothorax or pleural  effusion. Cardiac size within normal limits. Multiple gas-filled dilated loops of small bowel are seen within the left mid abdomen. Gas is seen within multiple nondilated loops of  distal small bowel within the right lower quadrant as well as throughout the colon and, together, the findings suggest changes of a partial or developing complete small bowel obstruction. There is no free intraperitoneal gas. No organomegaly. No abnormal calcifications within the abdomen and pelvis. Degenerative changes are noted within the hips bilaterally IMPRESSION: Partial or developing complete mid small bowel obstruction Electronically Signed   By: Helyn Numbers M.D.   On: 01/14/2021 23:17      Subjective: Patient seen and examined at bedside.  No fever, vomiting or worsening abdominal pain reported.  Complains of left hand and right foot pain and feels that this is gout flare  Discharge Exam: Vitals:   01/17/21 0454 01/17/21 1401  BP: (!) 146/81 (!) 156/89  Pulse: 65 83  Resp: 18 18  Temp:  97.9 F (36.6 C)  SpO2: 96% 95%    General exam: On room air currently.  No acute distress. Respiratory system: Decreased breath sounds at bases bilaterally  Cardiovascular system: Rate controlled, S1-S2 heard gastrointestinal system: Abdomen is morbidly obese, mildly distended, soft, umbilical hernia present.  Bowel sounds are slightly heard Extremities: Mild lower extremity edema; no clubbing Musculoskeletal: Left hand and right foot mild tenderness and swelling present    The results of significant diagnostics from this hospitalization (including imaging, microbiology, ancillary and laboratory) are listed below for reference.     Microbiology: Recent Results (from the past 240 hour(s))  SARS CORONAVIRUS 2 (TAT 6-24 HRS) Nasopharyngeal Nasopharyngeal Swab     Status: None   Collection Time: 01/15/21 12:38 AM   Specimen: Nasopharyngeal Swab  Result Value Ref Range Status   SARS Coronavirus 2 NEGATIVE NEGATIVE Final    Comment: (NOTE) SARS-CoV-2 target nucleic acids are NOT DETECTED.  The SARS-CoV-2 RNA is generally detectable in upper and lower respiratory specimens during the  acute phase of infection. Negative results do not preclude SARS-CoV-2 infection, do not rule out co-infections with other pathogens, and should not be used as the sole basis for treatment or other patient management decisions. Negative results must be combined with clinical observations, patient history, and epidemiological information. The expected result is Negative.  Fact Sheet for Patients: HairSlick.no  Fact Sheet for Healthcare Providers: quierodirigir.com  This test is not yet approved or cleared by the [place] FDA and  has been authorized for detection and/or diagnosis of SARS-CoV-2 by FDA under an Emergency Use Authorization (EUA). This EUA will remain  in effect (meaning this test can be used) for the duration of the COVID-19 declaration under Se ction 564(b)(1) of the Act, 21 U.S.C. section 360bbb-3(b)(1), unless the authorization is terminated or revoked sooner.  Performed at [facility], [address], [place], [place] [address]      Labs: BNP (last 3 results) No results for input(s): BNP in the last 8760 hours. Basic Metabolic Panel: Recent Labs  Lab 01/14/21 2225 01/15/21 0330 01/16/21 0403  NA 137 142 139  K 3.5 4.4 3.6  CL 99 104 102  CO2 28 31 29   GLUCOSE 120* 115* 105*  BUN 10 10 9   CREATININE 1.11 1.11 0.92  CALCIUM 8.6* 8.8* 8.4*  MG  --   --  1.7   Liver Function Tests: Recent Labs  Lab 01/14/21 2225  AST 19  ALT 25  ALKPHOS 83  BILITOT 0.8  PROT 7.5  ALBUMIN 3.4*   Recent Labs  Lab 01/14/21 2225  LIPASE 26   No results for input(s): AMMONIA in the last 168 hours. CBC: Recent Labs  Lab 01/14/21 2225 01/15/21 0330 01/16/21 0403  WBC 13.6* 14.6* 10.9*  NEUTROABS  --   --  6.0  HGB 12.7* 12.3* 11.4*  HCT 41.0 40.0 37.0*  MCV 85.2 86.0 85.3  PLT 282 296 267   Cardiac Enzymes: No results for input(s): CKTOTAL, CKMB, CKMBINDEX, TROPONINI in the last 168  hours. BNP: Invalid input(s): POCBNP CBG: Recent Labs  Lab 01/16/21 1613 01/16/21 2134 01/17/21 0759 01/17/21 1140 01/17/21 1543  GLUCAP 109* 158* 90 81 101*   D-Dimer No results for input(s): DDIMER in the last 72 hours. Hgb A1c Recent Labs    01/15/21 0330  HGBA1C 6.8*   Lipid Profile No results for input(s): CHOL, HDL, LDLCALC, TRIG, CHOLHDL, LDLDIRECT in the last 72 hours. Thyroid function studies No results for input(s): TSH, T4TOTAL, T3FREE, THYROIDAB in the last 72 hours.  Invalid input(s): FREET3 Anemia work up No results for input(s): VITAMINB12, FOLATE, FERRITIN, TIBC, IRON, RETICCTPCT in the last 72 hours. Urinalysis    Component Value Date/Time   COLORURINE YELLOW 01/14/2021 2330   APPEARANCEUR CLEAR 01/14/2021 2330   LABSPEC 1.015 01/14/2021 2330   PHURINE 7.5 01/14/2021 2330   GLUCOSEU NEGATIVE 01/14/2021 2330   GLUCOSEU NEGATIVE 08/21/2011 1147   HGBUR NEGATIVE 01/14/2021 2330   BILIRUBINUR NEGATIVE 01/14/2021 2330   BILIRUBINUR neg 01/27/2013 0829   KETONESUR NEGATIVE 01/14/2021 2330   PROTEINUR NEGATIVE 01/14/2021 2330   UROBILINOGEN negative 01/27/2013 0829   UROBILINOGEN 0.2 08/21/2011 1147   NITRITE NEGATIVE 01/14/2021 2330   LEUKOCYTESUR NEGATIVE 01/14/2021 2330   Sepsis Labs Invalid input(s): PROCALCITONIN,  WBC,  LACTICIDVEN Microbiology Recent Results (from the past 240 hour(s))  SARS CORONAVIRUS 2 (TAT 6-24 HRS) Nasopharyngeal Nasopharyngeal Swab     Status: None   Collection Time: 01/15/21 12:38 AM   Specimen: Nasopharyngeal Swab  Result Value Ref Range Status   SARS Coronavirus 2 NEGATIVE NEGATIVE Final    Comment: (NOTE) SARS-CoV-2 target nucleic acids are NOT DETECTED.  The SARS-CoV-2 RNA is generally detectable in upper and lower respiratory specimens during the acute phase of infection. Negative results do not preclude SARS-CoV-2 infection, do not rule out co-infections with other pathogens, and should not be used as  the sole basis for treatment or other patient management decisions. Negative results must be combined with clinical observations, patient history, and epidemiological information. The expected result is Negative.  Fact Sheet for Patients: HairSlick.no  Fact Sheet for Healthcare Providers: quierodirigir.com  This test is not yet approved or cleared by the [place] FDA and  has been authorized for detection and/or diagnosis of SARS-CoV-2 by FDA under an Emergency Use Authorization (EUA). This EUA will remain  in effect (meaning this test can be used) for the duration of the COVID-19 declaration under Se ction 564(b)(1) of the Act, 21 U.S.C. section 360bbb-3(b)(1), unless the authorization is terminated or revoked sooner.  Performed at [facility], [address], [place], [place] [address]      Time coordinating discharge: 35 minutes  SIGNED:   Glade Lloyd, MD  [facility] 01/17/2021, 4:39 PM

## 2021-01-17 NOTE — Progress Notes (Signed)
Patient ID: Frank Howe, male   DOB: [birth year], 45 y.o.   MRN: [id number]  PROGRESS NOTE    Frank Howe  OZD:[id number] DOB: 29-Apr-1975 DOA: 01/14/2021 PCP: Dois Davenport, MD   Brief Narrative:  45 year old male with history of hypertension, diabetes mellitus type 2, morbid obesity presented with abdominal pain, nausea and vomiting.  On presentation, he had a small umbilical hernia that was partially reduced in the emergency room; CT of the abdomen showed small bowel obstruction with transition point in the periumbilical region.  WBCs of 13,600.  General surgery recommended admission to hospitalist service.  Assessment & Plan:   Small bowel obstruction secondary to an umbilical hernia -General surgery following: Plan for surgical intervention for umbilical hernia today -Continue IV fluids.  Continue pain management  Essential hypertension -Blood pressure intermittently elevated.  Use IV antihypertensives if needed.  Hyzaar currently on hold.  Leukocytosis -Reactive.  Resolved  Diabetes mellitus type 2 -A1c 6.8.  We will start CBGs with SSI.  Possible acute gout flare -Complains of left hand and right foot pain, similar to prior gout flare.  Uses as needed colchicine for gout flares at home.  Will order 1 dose of colchicine 1.2 mg today.  Morbid obesity -Outpatient follow-up     DVT prophylaxis: Early ambulation Code Status: Full Family Communication: Wife at bedside Disposition Plan: Status is: Inpatient  Remains inpatient appropriate because:Inpatient level of care appropriate due to severity of illness  Dispo: The patient is from: Home              Anticipated d/c is to: Home              Patient currently is not medically stable to d/c.   Difficult to place patient No  Consultants: General surgery  Procedures: None  Antimicrobials: None   Subjective: Patient seen and examined at bedside.  No fever, vomiting or worsening abdominal pain reported.   Complains of left hand and right foot pain and feels that this is gout flare Objective: Vitals:   01/16/21 1535 01/16/21 2037 01/17/21 0212 01/17/21 0454  BP:  (!) 163/97 (!) 172/102 (!) 146/81  Pulse: 80 75 73 65  Resp:  18 18 18   Temp:      TempSrc:      SpO2: 95% 96% 95% 96%  Weight:      Height:        Intake/Output Summary (Last 24 hours) at 01/17/2021 0735 Last data filed at 01/17/2021 0600 Gross per 24 hour  Intake 2289.16 ml  Output 1700 ml  Net 589.16 ml    Filed Weights   01/14/21 2203  Weight: (!) 145.2 kg    Examination:  General exam: On room air currently.  No acute distress. Respiratory system: Decreased breath sounds at bases bilaterally  Cardiovascular system: Rate controlled, S1-S2 heard gastrointestinal system: Abdomen is morbidly obese, mildly distended, soft, umbilical hernia present.  Bowel sounds are slightly heard Extremities: Mild lower extremity edema; no clubbing Musculoskeletal: Left hand and right foot mild tenderness and swelling present  Data Reviewed: I have personally reviewed following labs and imaging studies  CBC: Recent Labs  Lab 01/14/21 2225 01/15/21 0330 01/16/21 0403  WBC 13.6* 14.6* 10.9*  NEUTROABS  --   --  6.0  HGB 12.7* 12.3* 11.4*  HCT 41.0 40.0 37.0*  MCV 85.2 86.0 85.3  PLT 282 296 267    Basic Metabolic Panel: Recent Labs  Lab 01/14/21 2225 01/15/21 0330 01/16/21 0403  NA 137  142 139  K 3.5 4.4 3.6  CL 99 104 102  CO2 28 31 29   GLUCOSE 120* 115* 105*  BUN 10 10 9   CREATININE 1.11 1.11 0.92  CALCIUM 8.6* 8.8* 8.4*  MG  --   --  1.7    GFR: Estimated Creatinine Clearance: 143.6 mL/min (by C-G formula based on SCr of 0.92 mg/dL). Liver Function Tests: Recent Labs  Lab 01/14/21 2225  AST 19  ALT 25  ALKPHOS 83  BILITOT 0.8  PROT 7.5  ALBUMIN 3.4*    Recent Labs  Lab 01/14/21 2225  LIPASE 26    No results for input(s): AMMONIA in the last 168 hours. Coagulation Profile: No results for  input(s): INR, PROTIME in the last 168 hours. Cardiac Enzymes: No results for input(s): CKTOTAL, CKMB, CKMBINDEX, TROPONINI in the last 168 hours. BNP (last 3 results) No results for input(s): PROBNP in the last 8760 hours. HbA1C: Recent Labs    01/15/21 0330  HGBA1C 6.8*    CBG: Recent Labs  Lab 01/16/21 1240 01/16/21 1613 01/16/21 2134  GLUCAP 126* 109* 158*   Lipid Profile: No results for input(s): CHOL, HDL, LDLCALC, TRIG, CHOLHDL, LDLDIRECT in the last 72 hours. Thyroid Function Tests: No results for input(s): TSH, T4TOTAL, FREET4, T3FREE, THYROIDAB in the last 72 hours. Anemia Panel: No results for input(s): VITAMINB12, FOLATE, FERRITIN, TIBC, IRON, RETICCTPCT in the last 72 hours. Sepsis Labs: No results for input(s): PROCALCITON, LATICACIDVEN in the last 168 hours.  Recent Results (from the past 240 hour(s))  SARS CORONAVIRUS 2 (TAT 6-24 HRS) Nasopharyngeal Nasopharyngeal Swab     Status: None   Collection Time: 01/15/21 12:38 AM   Specimen: Nasopharyngeal Swab  Result Value Ref Range Status   SARS Coronavirus 2 NEGATIVE NEGATIVE Final    Comment: (NOTE) SARS-CoV-2 target nucleic acids are NOT DETECTED.  The SARS-CoV-2 RNA is generally detectable in upper and lower respiratory specimens during the acute phase of infection. Negative results do not preclude SARS-CoV-2 infection, do not rule out co-infections with other pathogens, and should not be used as the sole basis for treatment or other patient management decisions. Negative results must be combined with clinical observations, patient history, and epidemiological information. The expected result is Negative.  Fact Sheet for Patients: 01/18/21  Fact Sheet for Healthcare Providers: 01/17/21  This test is not yet approved or cleared by the [employer]lick.no FDA and  has been authorized for detection and/or diagnosis of SARS-CoV-2 by FDA under  an Emergency Use Authorization (EUA). This EUA will remain  in effect (meaning this test can be used) for the duration of the COVID-19 declaration under Se ction 564(b)(1) of the Act, 21 U.S.C. section 360bbb-3(b)(1), unless the authorization is terminated or revoked sooner.  Performed at [facility], [address], [place], [address] [place]           Radiology Studies: No results found.      Scheduled Meds:  insulin aspart  0-20 Units Subcutaneous TID WC   insulin aspart  0-5 Units Subcutaneous QHS   Continuous Infusions:  lactated ringers 75 mL/hr at 01/16/21 2004          01/18/21, MD [facility] 01/17/2021, 7:35 AM

## 2021-02-07 ENCOUNTER — Ambulatory Visit: Payer: Self-pay | Admitting: Surgery

## 2021-02-09 NOTE — Telephone Encounter (Signed)
AO please advise if another order can be placed for BIPAP machine since the one that was ordered from [employer] is on backorder and they do not know when this will be in stock.  thanks

## 2021-02-09 NOTE — Telephone Encounter (Signed)
Patient came into the office and states [employer] told him the BiPAP machine that was ordered is out of stock and they don't have an estimated time when it will be available. [employer] told patient to contact our office to see if an order for another machine could be placed in the meantime.   Patient also requested notes to be sent to his PCP from his visit. I have faxed these records to Nadyne Coombes.  Please advise.

## 2021-02-11 NOTE — Telephone Encounter (Signed)
Yes, another order can be sent in

## 2021-02-11 NOTE — Telephone Encounter (Signed)
Left message for patient to call back  

## 2021-02-16 NOTE — Progress Notes (Addendum)
COVID swab appointment:02/25/21  COVID Vaccine Completed: yes x4 Date COVID Vaccine completed: Has received booster: COVID vaccine manufacturer: Pfizer      Date of COVID positive in last 90 days: no  PCP - Nadyne Coombes, MD Cardiologist - n/a  Chest x-ray - 01/14/21 Epic EKG - 04/27/20 Epic Stress Test - n/a ECHO - n/a Cardiac Cath - n/a Pacemaker/ICD device last checked: n/a Spinal Cord Stimulator:n/a  Sleep Study - yes positive CPAP - working on getting it  Fasting Blood Sugar - pre DM, doesn't check Checks Blood Sugar doesn't check at home  Blood Thinner Instructions: n/a Aspirin Instructions: Last Dose:  Activity level: Can go up a flight of stairs and perform activities of daily living without stopping and without symptoms of chest pain or shortness of breath. SOB with activity       Anesthesia review: HTN, DM, BP at PAT 164/100. Patient denies symptoms and reports he did not take his amlodipine this AM. He will take his medication and recheck BP at home and call PCP today to let her know his BP is elevated. Patient was informed of risk of cancellation if BP is elevated DOS.  Patient denies shortness of breath, fever, cough and chest pain at PAT appointment   Patient verbalized understanding of instructions that were given to them at the PAT appointment. Patient was also instructed that they will need to review over the PAT instructions again at home before surgery.

## 2021-02-16 NOTE — Patient Instructions (Addendum)
DUE TO COVID-19 ONLY ONE VISITOR IS ALLOWED TO COME WITH YOU AND STAY IN THE WAITING ROOM ONLY DURING PRE OP AND PROCEDURE.   **NO VISITORS ARE ALLOWED IN THE SHORT STAY AREA OR RECOVERY ROOM!!**  IF YOU WILL BE ADMITTED INTO THE HOSPITAL YOU ARE ALLOWED ONLY TWO SUPPORT PEOPLE DURING VISITATION HOURS ONLY (10AM -8PM)   The support person(s) may change daily. The support person(s) must pass our screening, gel in and out, and wear a mask at all times, including in the patient's room. Patients must also wear a mask when staff or their support person are in the room.  No visitors under the age of 49. Any visitor under the age of 63 must be accompanied by an adult.    COVID SWAB TESTING MUST BE COMPLETED ON:  02/25/21 **MUST PRESENT COMPLETED FORM AT TESTING SITE**    [address] [place] (backside of the building) No appointment needed. Open 8am-3pm. You are not required to quarantine, however you are required to wear a well-fitted mask when you are out and around people not in your household.  Hand Hygiene often Do NOT share personal items Notify your provider if you are in close contact with someone who has COVID or you develop fever 100.4 or greater, new onset of sneezing, cough, sore throat, shortness of breath or body aches.       Your procedure is scheduled on: 03/01/21   Report to [facility] Main Entrance    Report to admitting at 9:15 AM   Call this number if you have problems the morning of surgery [phone]   Do not eat food :After Midnight.   May have liquids until 8:30 AM day of surgery  CLEAR LIQUID DIET  Foods Allowed                                                                     Foods Excluded  Water, Black Coffee and tea (no milk or creamer)            liquids that you cannot  Plain Jell-O in any flavor  (No red)                                     see through such as: Fruit ices (not with fruit pulp)                                              milk, soups, orange juice              Iced Popsicles (No red)                                                All solid food  Apple juices Sports drinks like Gatorade (No red) Lightly seasoned clear broth or consume(fat free) Sugar   Oral Hygiene is also important to reduce your risk of infection.                                    Remember - BRUSH YOUR TEETH THE MORNING OF SURGERY WITH YOUR REGULAR TOOTHPASTE   Take these medicines the morning of surgery with A SIP OF WATER: Amlodipine                              You may not have any metal on your body including jewelry, and body piercing             Do not wear lotions, powders, cologne, or deodorant              Men may shave face and neck.   Do not bring valuables to the hospital. [facility] IS NOT             RESPONSIBLE   FOR VALUABLES.   Bring small overnight bag day of surgery.  Special Instructions: Bring a copy of your healthcare power of attorney and living will documents         the day of surgery if you haven't scanned them before.  Please read over the following fact sheets you were given: IF YOU HAVE QUESTIONS ABOUT YOUR PRE-OP INSTRUCTIONS PLEASE CALL [phone]- [facility] - Preparing for Surgery Before surgery, you can play an important role.  Because skin is not sterile, your skin needs to be as free of germs as possible.  You can reduce the number of germs on your skin by washing with CHG (chlorahexidine gluconate) soap before surgery.  CHG is an antiseptic cleaner which kills germs and bonds with the skin to continue killing germs even after washing. Please DO NOT use if you have an allergy to CHG or antibacterial soaps.  If your skin becomes reddened/irritated stop using the CHG and inform your nurse when you arrive at Short Stay. Do not shave (including legs and underarms) for at least 48 hours prior to the first CHG shower.  You may shave your  face/neck.  Please follow these instructions carefully:  1.  Shower with CHG Soap the night before surgery and the  morning of surgery.  2.  If you choose to wash your hair, wash your hair first as usual with your normal  shampoo.  3.  After you shampoo, rinse your hair and body thoroughly to remove the shampoo.                             4.  Use CHG as you would any other liquid soap.  You can apply chg directly to the skin and wash.  Gently with a scrungie or clean washcloth.  5.  Apply the CHG Soap to your body ONLY FROM THE NECK DOWN.   Do   not use on face/ open                           Wound or open sores. Avoid contact with eyes, ears mouth and   genitals (private parts).  Wash face,  Genitals (private parts) with your normal soap.             6.  Wash thoroughly, paying special attention to the area where your    surgery  will be performed.  7.  Thoroughly rinse your body with warm water from the neck down.  8.  DO NOT shower/wash with your normal soap after using and rinsing off the CHG Soap.                9.  Pat yourself dry with a clean towel.            10.  Wear clean pajamas.            11.  Place clean sheets on your bed the night of your first shower and do not  sleep with pets. Day of Surgery : Do not apply any lotions/deodorants the morning of surgery.  Please wear clean clothes to the hospital/surgery center.  FAILURE TO FOLLOW THESE INSTRUCTIONS MAY RESULT IN THE CANCELLATION OF YOUR SURGERY  PATIENT SIGNATURE_________________________________  NURSE SIGNATURE__________________________________  ________________________________________________________________________

## 2021-02-17 ENCOUNTER — Encounter (HOSPITAL_COMMUNITY)
Admission: RE | Admit: 2021-02-17 | Discharge: 2021-02-17 | Disposition: A | Discharge status: Home / Self Care | Payer: Medicaid Other | Source: Ambulatory Visit | Attending: Surgery | Admitting: Surgery

## 2021-02-17 ENCOUNTER — Encounter (HOSPITAL_COMMUNITY): Payer: Self-pay

## 2021-02-17 ENCOUNTER — Other Ambulatory Visit: Payer: Self-pay

## 2021-02-17 VITALS — HR 85 | Temp 98.5°F | Resp 18 | Ht 68.0 in

## 2021-02-17 DIAGNOSIS — I10 Essential (primary) hypertension: Secondary | ICD-10-CM | POA: Diagnosis not present

## 2021-02-17 DIAGNOSIS — Z01818 Encounter for other preprocedural examination: Secondary | ICD-10-CM | POA: Insufficient documentation

## 2021-02-17 HISTORY — DX: Personal history of urinary calculi: Z87.442

## 2021-02-17 HISTORY — DX: Essential (primary) hypertension: I10

## 2021-02-17 LAB — CBC
HCT: 46 % (ref 39.0–52.0)
Hemoglobin: 14.2 g/dL (ref 13.0–17.0)
MCH: 26.4 pg (ref 26.0–34.0)
MCHC: 30.9 g/dL (ref 30.0–36.0)
MCV: 85.7 fL (ref 80.0–100.0)
Platelets: 281 10*3/uL (ref 150–400)
RBC: 5.37 MIL/uL (ref 4.22–5.81)
RDW: 14.6 % (ref 11.5–15.5)
WBC: 8.7 10*3/uL (ref 4.0–10.5)
nRBC: 0 % (ref 0.0–0.2)

## 2021-02-17 LAB — BASIC METABOLIC PANEL
Anion gap: 8 (ref 5–15)
BUN: 11 mg/dL (ref 6–20)
CO2: 26 mmol/L (ref 22–32)
Calcium: 8.7 mg/dL — ABNORMAL LOW (ref 8.9–10.3)
Chloride: 104 mmol/L (ref 98–111)
Creatinine, Ser: 0.99 mg/dL (ref 0.61–1.24)
GFR, Estimated: >60 mL/min (ref >60)
Glucose, Bld: 101 mg/dL — ABNORMAL HIGH (ref 70–99)
Potassium: 3.9 mmol/L (ref 3.5–5.1)
Sodium: 138 mmol/L (ref 135–145)

## 2021-02-17 LAB — GLUCOSE, CAPILLARY: Glucose-Capillary: 111 mg/dL — ABNORMAL HIGH (ref 70–99)

## 2021-02-22 NOTE — Progress Notes (Signed)
Anesthesia Chart Review   Case: [id number] Date/Time: 03/01/21 1115   Procedure: ROBOTIC POSSIBLY OPEN RECURRENT INCISIONAL HERNIA REPAIR WITH MESH   Anesthesia type: General   Pre-op diagnosis: RECURRENT INCISIONAL HERNIA   Location: [facility] ROOM 02 / [facility]   Surgeons: Stechschulte, Hyman Hopes, MD       DISCUSSION:44 y.o. former smoker with h/o HTN, OSA, DM II, HTN, recurrent incisional hernia scheduled for above procedure 03/01/2021 with Dr. Ivar Drape.   Admission 9/23-9/26/2022 with small bowel obstruction secondary to umbilical hernia, managed conservatively with outpatient follow up with general surgery.   Antihypertensives started during hospitalization.  Pt followed up with PCP 103/2022. He is now on amlodipine. BP elevated at PAT visit, pt reports he did not take his medication today.  Advised to continue to check at home and contact PCP if it remains high.  Risk of cancellation discussed.  VS: Pulse 85   Temp 36.9 C (Oral)   Resp 18   Ht 5\' 8"  (1.727 m)   SpO2 96%   BMI 48.66 kg/m   PROVIDERS: , MD is PCP    LABS: Labs reviewed: Acceptable for surgery. (all labs ordered are listed, but only abnormal results are displayed)  Labs Reviewed  BASIC METABOLIC PANEL - Abnormal; Notable for the following components:      Result Value   Glucose, Bld 101 (*)    Calcium 8.7 (*)    All other components within normal limits  GLUCOSE, CAPILLARY - Abnormal; Notable for the following components:   Glucose-Capillary 111 (*)    All other components within normal limits  CBC     IMAGES:   EKG: 04/27/2020 Rate 65 bpm  NSR Minimal voltage criteria for LVH, may be normal variant  Nonspecific T wave abnormality  CV:  Past Medical History:  Diagnosis Date   Diabetes mellitus type 2 in obese (HCC) 01/15/2021   Elevated blood pressure reading without diagnosis of hypertension    Gout    History of kidney stones    Hypertension    Impaired fasting blood sugar     Obesity    OSA (obstructive sleep apnea)     Past Surgical History:  Procedure Laterality Date   UMBILICAL HERNIA REPAIR N/A 09/14/2017   Procedure: LAPAROSCOPIC UMBILICAL HERNIA;  Surgeon: 09/16/2017, MD;  Location: [facility];  Service: General;  Laterality: N/A;   UMBILICAL HERNIA REPAIR N/A 02/11/2018   Procedure: LAPARASCOPIC REDO REPAIR OF RECURRENT UMBILICAL HERNIA WITH INSERTION OF MESH, LYSIS OF ADHESIONS;  Surgeon: 02/13/2018, MD;  Location: [facility];  Service: General;  Laterality: N/A;    MEDICATIONS:  amLODipine (NORVASC) 5 MG tablet   colchicine 0.6 MG tablet   indomethacin (INDOCIN) 50 MG capsule   ondansetron (ZOFRAN) 4 MG tablet   No current facility-administered medications for this encounter.     Andria Meuse Ward, PA-C [facility]Surgical [facility] [phone]

## 2021-02-25 ENCOUNTER — Other Ambulatory Visit: Payer: Self-pay | Admitting: Surgery

## 2021-02-25 LAB — SARS CORONAVIRUS 2 (TAT 6-24 HRS): SARS Coronavirus 2: NEGATIVE

## 2021-03-01 ENCOUNTER — Ambulatory Visit (HOSPITAL_COMMUNITY): Payer: Medicaid Other | Admitting: Certified Registered Nurse Anesthetist

## 2021-03-01 ENCOUNTER — Encounter (HOSPITAL_COMMUNITY): Payer: Self-pay | Admitting: Surgery

## 2021-03-01 ENCOUNTER — Ambulatory Visit (HOSPITAL_COMMUNITY): Payer: Medicaid Other | Admitting: Physician Assistant

## 2021-03-01 ENCOUNTER — Encounter (HOSPITAL_COMMUNITY)
Admission: RE | Disposition: A | Discharge status: Home / Self Care | Payer: Self-pay | Source: Home / Self Care | Attending: Surgery

## 2021-03-01 ENCOUNTER — Other Ambulatory Visit: Payer: Self-pay

## 2021-03-01 ENCOUNTER — Observation Stay (HOSPITAL_COMMUNITY)
Admission: RE | Admit: 2021-03-01 | Discharge: 2021-03-02 | Disposition: A | Discharge status: Home / Self Care | Payer: Medicaid Other | Attending: Surgery | Admitting: Surgery

## 2021-03-01 DIAGNOSIS — E119 Type 2 diabetes mellitus without complications: Secondary | ICD-10-CM | POA: Diagnosis not present

## 2021-03-01 DIAGNOSIS — Z23 Encounter for immunization: Secondary | ICD-10-CM | POA: Insufficient documentation

## 2021-03-01 DIAGNOSIS — I10 Essential (primary) hypertension: Secondary | ICD-10-CM | POA: Insufficient documentation

## 2021-03-01 DIAGNOSIS — K430 Incisional hernia with obstruction, without gangrene: Principal | ICD-10-CM | POA: Insufficient documentation

## 2021-03-01 DIAGNOSIS — Z87891 Personal history of nicotine dependence: Secondary | ICD-10-CM | POA: Insufficient documentation

## 2021-03-01 DIAGNOSIS — Z79899 Other long term (current) drug therapy: Secondary | ICD-10-CM | POA: Diagnosis not present

## 2021-03-01 DIAGNOSIS — K432 Incisional hernia without obstruction or gangrene: Secondary | ICD-10-CM | POA: Diagnosis present

## 2021-03-01 HISTORY — PX: XI ROBOTIC ASSISTED VENTRAL HERNIA: SHX6789

## 2021-03-01 LAB — CBC
HCT: 42.3 % (ref 39.0–52.0)
Hemoglobin: 13.3 g/dL (ref 13.0–17.0)
MCH: 26.9 pg (ref 26.0–34.0)
MCHC: 31.4 g/dL (ref 30.0–36.0)
MCV: 85.6 fL (ref 80.0–100.0)
Platelets: 308 10*3/uL (ref 150–400)
RBC: 4.94 MIL/uL (ref 4.22–5.81)
RDW: 14.5 % (ref 11.5–15.5)
WBC: 13.8 10*3/uL — ABNORMAL HIGH (ref 4.0–10.5)
nRBC: 0 % (ref 0.0–0.2)

## 2021-03-01 LAB — CREATININE, SERUM
Creatinine, Ser: 1.17 mg/dL (ref 0.61–1.24)
GFR, Estimated: >60 mL/min (ref >60)

## 2021-03-01 LAB — GLUCOSE, CAPILLARY: Glucose-Capillary: 132 mg/dL — ABNORMAL HIGH (ref 70–99)

## 2021-03-01 SURGERY — REPAIR, HERNIA, VENTRAL, ROBOT-ASSISTED
Anesthesia: General | Site: Abdomen

## 2021-03-01 MED ORDER — FENTANYL CITRATE PF 50 MCG/ML IJ SOSY: 25.0000 ug | PREFILLED_SYRINGE | INTRAMUSCULAR | Status: DC | PRN

## 2021-03-01 MED ORDER — AMLODIPINE BESYLATE 5 MG PO TABS
5.0000 mg | ORAL_TABLET | Freq: Every day | ORAL | Status: DC
  Administered 2021-03-02: 5 mg via ORAL
  Filled 2021-03-01: qty 1

## 2021-03-01 MED ORDER — LACTATED RINGERS IV SOLN: INTRAVENOUS | Status: DC

## 2021-03-01 MED ORDER — CHLORHEXIDINE GLUCONATE CLOTH 2 % EX PADS: 6.0000 | MEDICATED_PAD | Freq: Once | CUTANEOUS | Status: DC

## 2021-03-01 MED ORDER — PROPOFOL 10 MG/ML IV BOLUS
INTRAVENOUS | Status: AC
  Filled 2021-03-01: qty 20

## 2021-03-01 MED ORDER — SODIUM CHLORIDE 0.9 % IV SOLN: INTRAVENOUS | Status: DC

## 2021-03-01 MED ORDER — DEXAMETHASONE SODIUM PHOSPHATE 10 MG/ML IJ SOLN
INTRAMUSCULAR | Status: AC
  Filled 2021-03-01: qty 1

## 2021-03-01 MED ORDER — SUGAMMADEX SODIUM 200 MG/2ML IV SOLN
INTRAVENOUS | Status: DC | PRN
Start: 2021-03-01 — End: 2021-03-01
  Administered 2021-03-01: 600 mg via INTRAVENOUS

## 2021-03-01 MED ORDER — PROPOFOL 10 MG/ML IV BOLUS
INTRAVENOUS | Status: DC | PRN
  Administered 2021-03-01: 200 mg via INTRAVENOUS

## 2021-03-01 MED ORDER — OXYCODONE HCL 5 MG PO TABS: 5.0000 mg | ORAL_TABLET | ORAL | Status: DC | PRN

## 2021-03-01 MED ORDER — DEXAMETHASONE SODIUM PHOSPHATE 10 MG/ML IJ SOLN
INTRAMUSCULAR | Status: DC | PRN
  Administered 2021-03-01: 8 mg via INTRAVENOUS

## 2021-03-01 MED ORDER — LACTATED RINGERS IR SOLN
Status: DC | PRN
  Administered 2021-03-01: 1000 mL

## 2021-03-01 MED ORDER — BUPIVACAINE LIPOSOME 1.3 % IJ SUSP
INTRAMUSCULAR | Status: AC
  Filled 2021-03-01: qty 20

## 2021-03-01 MED ORDER — LIDOCAINE HCL (PF) 2 % IJ SOLN
INTRAMUSCULAR | Status: AC
  Filled 2021-03-01: qty 5

## 2021-03-01 MED ORDER — PHENYLEPHRINE 40 MCG/ML (10ML) SYRINGE FOR IV PUSH (FOR BLOOD PRESSURE SUPPORT)
PREFILLED_SYRINGE | INTRAVENOUS | Status: DC | PRN
  Administered 2021-03-01 (×3): 80 ug via INTRAVENOUS
  Administered 2021-03-01 (×2): 120 ug via INTRAVENOUS

## 2021-03-01 MED ORDER — OXYCODONE HCL 5 MG PO TABS: 10.0000 mg | ORAL_TABLET | ORAL | Status: DC | PRN

## 2021-03-01 MED ORDER — INFLUENZA VAC SPLIT QUAD 0.5 ML IM SUSY
0.5000 mL | PREFILLED_SYRINGE | INTRAMUSCULAR | Status: AC
  Administered 2021-03-02: 0.5 mL via INTRAMUSCULAR
  Filled 2021-03-01: qty 0.5

## 2021-03-01 MED ORDER — METHOCARBAMOL 1000 MG/10ML IJ SOLN
500.0000 mg | Freq: Four times a day (QID) | INTRAVENOUS | Status: DC | PRN
  Filled 2021-03-01: qty 5

## 2021-03-01 MED ORDER — KETOROLAC TROMETHAMINE 15 MG/ML IJ SOLN: 15.0000 mg | INTRAMUSCULAR | Status: DC

## 2021-03-01 MED ORDER — KETAMINE HCL 10 MG/ML IJ SOLN
INTRAMUSCULAR | Status: DC | PRN
  Administered 2021-03-01: 100 mg via INTRAVENOUS

## 2021-03-01 MED ORDER — MIDAZOLAM HCL 5 MG/5ML IJ SOLN
INTRAMUSCULAR | Status: DC | PRN
  Administered 2021-03-01: 2 mg via INTRAVENOUS

## 2021-03-01 MED ORDER — ENOXAPARIN SODIUM 40 MG/0.4ML IJ SOSY
40.0000 mg | PREFILLED_SYRINGE | INTRAMUSCULAR | Status: DC
  Administered 2021-03-02: 40 mg via SUBCUTANEOUS
  Filled 2021-03-01: qty 0.4

## 2021-03-01 MED ORDER — GABAPENTIN 300 MG PO CAPS
300.0000 mg | ORAL_CAPSULE | ORAL | Status: AC
  Administered 2021-03-01: 300 mg via ORAL
  Filled 2021-03-01: qty 1

## 2021-03-01 MED ORDER — ONDANSETRON HCL 4 MG/2ML IJ SOLN: 4.0000 mg | Freq: Four times a day (QID) | INTRAMUSCULAR | Status: DC | PRN

## 2021-03-01 MED ORDER — BUPIVACAINE-EPINEPHRINE (PF) 0.25% -1:200000 IJ SOLN
INTRAMUSCULAR | Status: AC
  Filled 2021-03-01: qty 30

## 2021-03-01 MED ORDER — PROCHLORPERAZINE EDISYLATE 10 MG/2ML IJ SOLN: 10.0000 mg | INTRAMUSCULAR | Status: DC | PRN

## 2021-03-01 MED ORDER — KETOROLAC TROMETHAMINE 15 MG/ML IJ SOLN
15.0000 mg | Freq: Three times a day (TID) | INTRAMUSCULAR | Status: DC
  Administered 2021-03-01 – 2021-03-02 (×2): 15 mg via INTRAVENOUS
  Filled 2021-03-01 (×2): qty 1

## 2021-03-01 MED ORDER — ACETAMINOPHEN 500 MG PO TABS: 1000.0000 mg | ORAL_TABLET | Freq: Once | ORAL | Status: DC

## 2021-03-01 MED ORDER — METOCLOPRAMIDE HCL 5 MG/ML IJ SOLN
10.0000 mg | Freq: Four times a day (QID) | INTRAMUSCULAR | Status: DC
  Administered 2021-03-01 – 2021-03-02 (×3): 10 mg via INTRAVENOUS
  Filled 2021-03-01 (×4): qty 2

## 2021-03-01 MED ORDER — FENTANYL CITRATE (PF) 100 MCG/2ML IJ SOLN
INTRAMUSCULAR | Status: AC
  Filled 2021-03-01: qty 2

## 2021-03-01 MED ORDER — CHLORHEXIDINE GLUCONATE 0.12 % MT SOLN
15.0000 mL | Freq: Once | OROMUCOSAL | Status: AC
  Administered 2021-03-01: 15 mL via OROMUCOSAL

## 2021-03-01 MED ORDER — ONDANSETRON HCL 4 MG/2ML IJ SOLN
INTRAMUSCULAR | Status: DC | PRN
  Administered 2021-03-01: 4 mg via INTRAVENOUS

## 2021-03-01 MED ORDER — COLCHICINE 0.6 MG PO TABS: 0.6000 mg | ORAL_TABLET | Freq: Every day | ORAL | Status: DC | PRN

## 2021-03-01 MED ORDER — GABAPENTIN 600 MG PO TABS
300.0000 mg | ORAL_TABLET | Freq: Three times a day (TID) | ORAL | Status: DC
  Filled 2021-03-01: qty 0.5

## 2021-03-01 MED ORDER — PHENYLEPHRINE 40 MCG/ML (10ML) SYRINGE FOR IV PUSH (FOR BLOOD PRESSURE SUPPORT)
PREFILLED_SYRINGE | INTRAVENOUS | Status: AC
  Filled 2021-03-01: qty 10

## 2021-03-01 MED ORDER — MIDAZOLAM HCL 2 MG/2ML IJ SOLN
INTRAMUSCULAR | Status: AC
  Filled 2021-03-01: qty 2

## 2021-03-01 MED ORDER — ORAL CARE MOUTH RINSE: 15.0000 mL | Freq: Once | OROMUCOSAL | Status: AC

## 2021-03-01 MED ORDER — PHENYLEPHRINE 40 MCG/ML (10ML) SYRINGE FOR IV PUSH (FOR BLOOD PRESSURE SUPPORT)
PREFILLED_SYRINGE | INTRAVENOUS | Status: AC
  Filled 2021-03-01: qty 30

## 2021-03-01 MED ORDER — ONDANSETRON HCL 4 MG/2ML IJ SOLN
INTRAMUSCULAR | Status: AC
  Filled 2021-03-01: qty 2

## 2021-03-01 MED ORDER — BUPIVACAINE LIPOSOME 1.3 % IJ SUSP
INTRAMUSCULAR | Status: DC | PRN
  Administered 2021-03-01: 20 mL

## 2021-03-01 MED ORDER — 0.9 % SODIUM CHLORIDE (POUR BTL) OPTIME
TOPICAL | Status: DC | PRN
  Administered 2021-03-01: 1000 mL

## 2021-03-01 MED ORDER — LIDOCAINE HCL (PF) 2 % IJ SOLN
INTRAMUSCULAR | Status: DC | PRN
  Administered 2021-03-01: 100 mg via INTRADERMAL

## 2021-03-01 MED ORDER — GABAPENTIN 300 MG PO CAPS
300.0000 mg | ORAL_CAPSULE | Freq: Three times a day (TID) | ORAL | Status: DC
  Administered 2021-03-01 – 2021-03-02 (×2): 300 mg via ORAL
  Filled 2021-03-01 (×2): qty 1

## 2021-03-01 MED ORDER — BUPIVACAINE LIPOSOME 1.3 % IJ SUSP: 20.0000 mL | Freq: Once | INTRAMUSCULAR | Status: DC

## 2021-03-01 MED ORDER — ACETAMINOPHEN 325 MG PO TABS
650.0000 mg | ORAL_TABLET | Freq: Four times a day (QID) | ORAL | Status: DC
  Administered 2021-03-01 – 2021-03-02 (×2): 650 mg via ORAL
  Filled 2021-03-01 (×3): qty 2

## 2021-03-01 MED ORDER — DOCUSATE SODIUM 100 MG PO CAPS
100.0000 mg | ORAL_CAPSULE | Freq: Two times a day (BID) | ORAL | Status: DC
  Administered 2021-03-01 – 2021-03-02 (×2): 100 mg via ORAL
  Filled 2021-03-01 (×2): qty 1

## 2021-03-01 MED ORDER — ROCURONIUM BROMIDE 10 MG/ML (PF) SYRINGE
PREFILLED_SYRINGE | INTRAVENOUS | Status: DC | PRN
  Administered 2021-03-01: 30 mg via INTRAVENOUS
  Administered 2021-03-01: 40 mg via INTRAVENOUS
  Administered 2021-03-01 (×3): 20 mg via INTRAVENOUS
  Administered 2021-03-01: 80 mg via INTRAVENOUS
  Administered 2021-03-01: 10 mg via INTRAVENOUS

## 2021-03-01 MED ORDER — ROCURONIUM BROMIDE 10 MG/ML (PF) SYRINGE
PREFILLED_SYRINGE | INTRAVENOUS | Status: AC
  Filled 2021-03-01: qty 10

## 2021-03-01 MED ORDER — CEFAZOLIN IN SODIUM CHLORIDE 3-0.9 GM/100ML-% IV SOLN
INTRAVENOUS | Status: AC
  Filled 2021-03-01: qty 100

## 2021-03-01 MED ORDER — ACETAMINOPHEN 500 MG PO TABS
1000.0000 mg | ORAL_TABLET | ORAL | Status: AC
  Administered 2021-03-01: 1000 mg via ORAL
  Filled 2021-03-01: qty 2

## 2021-03-01 MED ORDER — SIMETHICONE 80 MG PO CHEW: 80.0000 mg | CHEWABLE_TABLET | Freq: Four times a day (QID) | ORAL | Status: DC | PRN

## 2021-03-01 MED ORDER — SUCCINYLCHOLINE CHLORIDE 200 MG/10ML IV SOSY
PREFILLED_SYRINGE | INTRAVENOUS | Status: DC | PRN
  Administered 2021-03-01: 200 mg via INTRAVENOUS

## 2021-03-01 MED ORDER — HYDROMORPHONE HCL 1 MG/ML IJ SOLN
1.0000 mg | INTRAMUSCULAR | Status: DC | PRN
  Administered 2021-03-01 – 2021-03-02 (×2): 1 mg via INTRAVENOUS
  Filled 2021-03-01 (×2): qty 1

## 2021-03-01 MED ORDER — FENTANYL CITRATE (PF) 100 MCG/2ML IJ SOLN
INTRAMUSCULAR | Status: DC | PRN
  Administered 2021-03-01 (×6): 50 ug via INTRAVENOUS

## 2021-03-01 MED ORDER — CEFAZOLIN IN SODIUM CHLORIDE 3-0.9 GM/100ML-% IV SOLN
3.0000 g | INTRAVENOUS | Status: AC
  Administered 2021-03-01 (×2): 3 g via INTRAVENOUS
  Filled 2021-03-01: qty 100

## 2021-03-01 MED ORDER — BUPIVACAINE-EPINEPHRINE 0.25% -1:200000 IJ SOLN
INTRAMUSCULAR | Status: DC | PRN
  Administered 2021-03-01: 30 mL

## 2021-03-01 MED ORDER — INDOMETHACIN 25 MG PO CAPS: 50.0000 mg | ORAL_CAPSULE | Freq: Three times a day (TID) | ORAL | Status: DC

## 2021-03-01 SURGICAL SUPPLY — 63 items
BAG COUNTER SPONGE SURGICOUNT (BAG) ×2 IMPLANT
BAG SURGICOUNT SPONGE COUNTING (BAG) ×1
BINDER ABDOMINAL 12 ML 46-62 (SOFTGOODS) ×3 IMPLANT
BLADE SURG SZ11 CARB STEEL (BLADE) ×3 IMPLANT
CHLORAPREP W/TINT 26 (MISCELLANEOUS) ×3 IMPLANT
COVER TIP SHEARS 8 DVNC (MISCELLANEOUS) ×1 IMPLANT
COVER TIP SHEARS 8MM DA VINCI (MISCELLANEOUS) ×3
DECANTER SPIKE VIAL GLASS SM (MISCELLANEOUS) ×3 IMPLANT
DEFOGGER SCOPE WARMER CLEARIFY (MISCELLANEOUS) ×3 IMPLANT
DERMABOND ADVANCED (GAUZE/BANDAGES/DRESSINGS) ×2
DERMABOND ADVANCED .7 DNX12 (GAUZE/BANDAGES/DRESSINGS) ×1 IMPLANT
DEVICE TROCAR PUNCTURE CLOSURE (ENDOMECHANICALS) IMPLANT
DRAPE ARM DVNC X/XI (DISPOSABLE) ×4 IMPLANT
DRAPE COLUMN DVNC XI (DISPOSABLE) ×1 IMPLANT
DRAPE DA VINCI XI ARM (DISPOSABLE) ×12
DRAPE DA VINCI XI COLUMN (DISPOSABLE) ×3
ELECT REM PT RETURN 15FT ADLT (MISCELLANEOUS) ×3 IMPLANT
GLOVE SRG 8 PF TXTR STRL LF DI (GLOVE) ×2 IMPLANT
GLOVE SURG ENC MOIS LTX SZ7.5 (GLOVE) ×6 IMPLANT
GLOVE SURG UNDER POLY LF SZ8 (GLOVE) ×6
GOWN STRL REUS W/TWL XL LVL3 (GOWN DISPOSABLE) ×9 IMPLANT
GRASPER SUT TROCAR 14GX15 (MISCELLANEOUS) IMPLANT
IRRIG SUCT STRYKERFLOW 2 WTIP (MISCELLANEOUS)
IRRIGATION SUCT STRKRFLW 2 WTP (MISCELLANEOUS) IMPLANT
KIT BASIN OR (CUSTOM PROCEDURE TRAY) ×3 IMPLANT
KIT TURNOVER KIT A (KITS) IMPLANT
MANIFOLD NEPTUNE II (INSTRUMENTS) ×3 IMPLANT
MAT PREVALON FULL STRYKER (MISCELLANEOUS) ×3 IMPLANT
MESH SOFT 12X12IN BARD (Mesh General) ×3 IMPLANT
MESH VICRYL KNITTED 12X12 (Mesh General) ×3 IMPLANT
NEEDLE SPNL 18GX3.5 QUINCKE PK (NEEDLE) ×3 IMPLANT
PACK CARDIOVASCULAR III (CUSTOM PROCEDURE TRAY) ×3 IMPLANT
SEAL CANN UNIV 5-8 DVNC XI (MISCELLANEOUS) ×3 IMPLANT
SEAL XI 5MM-8MM UNIVERSAL (MISCELLANEOUS) ×9
SEALER VESSEL DA VINCI XI (MISCELLANEOUS)
SEALER VESSEL EXT DVNC XI (MISCELLANEOUS) IMPLANT
SOL ANTI FOG 6CC (MISCELLANEOUS) ×1 IMPLANT
SOLUTION ANTI FOG 6CC (MISCELLANEOUS) ×2
SOLUTION ELECTROLUBE (MISCELLANEOUS) ×3 IMPLANT
SPONGE T-LAP 18X18 ~~LOC~~+RFID (SPONGE) ×3 IMPLANT
SUT MNCRL AB 4-0 PS2 18 (SUTURE) ×3 IMPLANT
SUT STRAFIX PDS 18 CTX (SUTURE) ×9 IMPLANT
SUT STRAFIX SPIRAL 2-0 3 (SUTURE) IMPLANT
SUT STRAFIX SPIRAL 2-0 5 (SUTURE) IMPLANT
SUT STRAFIX SPIRAL 2-0 9 (SUTURE) IMPLANT
SUT STRAFIX SYMMETRIC 0-0 12 (SUTURE)
SUT STRAFIX SYMMETRIC 0-0 18 (SUTURE)
SUT STRAFIX SYMMETRIC 0-0 24 (SUTURE)
SUT STRAFIX SYMMETRIC 1-0 12 (SUTURE)
SUT STRAFIX SYMMETRIC 1-0 24 (SUTURE)
SUT V-LOC 180 0-0 GS22 (SUTURE) ×6 IMPLANT
SUTURE STRAFIX SYMMETRC 0-0 12 (SUTURE) IMPLANT
SUTURE STRAFIX SYMMETRC 0-0 18 (SUTURE) IMPLANT
SUTURE STRAFIX SYMMETRC 0-0 24 (SUTURE) IMPLANT
SUTURE STRAFIX SYMMETRC 1-0 12 (SUTURE) IMPLANT
SUTURE STRAFIX SYMMETRC 1-0 24 (SUTURE) IMPLANT
SYR 20ML LL LF (SYRINGE) ×3 IMPLANT
TOWEL OR 17X26 10 PK STRL BLUE (TOWEL DISPOSABLE) ×3 IMPLANT
TOWEL OR NON WOVEN STRL DISP B (DISPOSABLE) IMPLANT
TROCAR BLADELESS OPT 12M 100M (ENDOMECHANICALS) ×3 IMPLANT
TROCAR BLADELESS OPT 5 100 (ENDOMECHANICALS) IMPLANT
TROCAR Z-THREAD FIOS 5X100MM (TROCAR) ×3 IMPLANT
TUBING INSUFFLATION 10FT LAP (TUBING) ×3 IMPLANT

## 2021-03-01 NOTE — Discharge Instructions (Signed)
 VENTRAL HERNIA REPAIR POST OPERATIVE INSTRUCTIONS  Thinking Clearly  The anesthesia may cause you to feel different for 1 or 2 days. Do not drive, drink alcohol, or make any big decisions for at least 2 days.  Nutrition When you wake up, you will be able to drink small amounts of liquid. If you do not feel sick, you can slowly advance your diet to regular foods. Continue to drink lots of fluids, usually about 8 to 10 glasses per day. Eat a high-fiber diet so you don't strain during bowel movements. High-Fiber Foods Foods high in fiber include beans, bran cereals and whole-grain breads, peas, dried fruit (figs, apricots, and dates), raspberries, blackberries, strawberries, sweet corn, broccoli, baked potatoes with skin, plums, pears, apples, greens, and nuts. Activity Slowly increase your activity. Be sure to get up and walk every hour or so to prevent blood clots. No heavy lifting or strenuous activity for 4 weeks following surgery to prevent hernias at your incision sites or recurrence of your hernia. It is normal to feel tired. You may need more sleep than usual.  Get your rest but make sure to get up and move around frequently to prevent blood clots and pneumonia.  Work and Return to School You can go back to work when you feel well enough. Discuss the timing with your surgeon. You can usually go back to school or work 1 week or less after an laparoscopic or an open repair. If your work requires heavy lifting or strenuous activity you need to be placed on light duty for 4 weeks following surgery. You can return to gym class, sports or other physical activities 4 weeks after surgery.  Wound Care You may experience significant bruising throughout the abdominal wall that may track down into the groin including into the scrotum in males.  Rest, elevating the groin and scrotum above the level of the heart, ice and compression with tight fitting underwear or an abdominal binder can help.   Always wash your hands before and after touching near your incision site. Do not soak in a bathtub until cleared at your follow up appointment. You may take a shower 24 hours after surgery. A small amount of drainage from the incision is normal. If the drainage is thick and yellow or the site is red, you may have an infection, so call your surgeon. If you have a drain in one of your incisions, it will be taken out in office when the drainage stops. Steri-Strips will fall off in 7 to 10 days or they will be removed during your first office visit. If you have dermabond glue covering over the incision, allow the glue to flake off on its own. Protect the new skin, especially from the sun. The sun can burn and cause darker scarring. Your scar will heal in about 4 to 6 weeks and will become softer and continue to fade over the next year.  The cosmetic appearance of the incisions will improve over the course of the first year after surgery. Sensation around your incision will return in a few weeks or months.  Bowel Movements After intestinal surgery, you may have loose watery stools for several days. If watery diarrhea lasts longer than 3 days, contact your surgeon. Pain medication (narcotics) can cause constipation. Increase the fiber in your diet with high-fiber foods if you are constipated. You can take an over the counter stool softener like Colace to avoid constipation.  Additional over the counter medications can also be used   if Colace isn't sufficient (for example, Milk of Magnesia or Miralax).  Pain The amount of pain is different for each person. Some people need only 1 to 3 doses of pain control medication, while others need more. Take alternating doses of tylenol and ibuprofen around the clock for the first five days following surgery.  This will provide a baseline of pain control and help with inflammation.  Take the narcotic pain medication in addition if needed for severe pain.  Contact  Your Surgeon at [phone], if you have: Pain that will not go away Pain that gets worse A fever of more than 101F (38.3C) Repeated vomiting Swelling, redness, bleeding, or bad-smelling drainage from your wound site Strong abdominal pain No bowel movement or unable to pass gas for 3 days Watery diarrhea lasting longer than 3 days  Pain Control The goal of pain control is to minimize pain, keep you moving and help you heal. Your surgical team will work with you on your pain plan. Most often a combination of therapies and medications are used to control your pain. You may also be given medication (local anesthetic) at the surgical site. This may help control your pain for several days. Extreme pain puts extra stress on your body at a time when your body needs to focus on healing. Do not wait until your pain has reached a level "10" or is unbearable before telling your doctor or nurse. It is much easier to control pain before it becomes severe. Following a laparoscopic procedure, pain is sometimes felt in the shoulder. This is due to the gas inserted into your abdomen during the procedure. Moving and walking helps to decrease the gas and the right shoulder pain.  Use the guide below for ways to manage your post-operative pain. Learn more by going to facs.org/safepaincontrol.  How Intense Is My Pain Common Therapies to Feel Better       I hardly notice my pain, and it does not interfere with my activities.  I notice my pain and it distracts me, but I can still do activities (sitting up, walking, standing).  Non-Medication Therapies  Ice (in a bag, applied over clothing at the surgical site), elevation, rest, meditation, massage, distraction (music, TV, play) walking and mild exercise Splinting the abdomen with pillows +  Non-Opioid Medications Acetaminophen (Tylenol) Non-steroidal anti-inflammatory drugs (NSAIDS) Aspirin, Ibuprofen (Motrin, Advil) Naproxen (Aleve) Take these as  needed, when you feel pain. Both acetaminophen and NSAIDs help to decrease pain and swelling (inflammation).      My pain is hard to ignore and is more noticeable even when I rest.  My pain interferes with my usual activities.  Non-Medication Therapies  +  Non-Opioid medications  Take on a regular schedule (around-the-clock) instead of as needed. (For example, Tylenol every 6 hours at 9:00 am, 3:00 pm, 9:00 pm, 3:00 am and Motrin every 6 hours at 12:00 am, 6:00 am, 12:00 pm, 6:00 pm)         I am focused on my pain, and I am not doing my daily activities.  I am groaning in pain, and I cannot sleep. I am unable to do anything.  My pain is as bad as it could be, and nothing else matters.  Non-Medication Therapies  +  Around-the-Clock Non-Opioid Medications  +  Short-acting opioids  Opioids should be used with other medications to manage severe pain. Opioids block pain and give a feeling of euphoria (feel high). Addiction, a serious side effect of opioids, is   rare with short-term (a few days) use.  Examples of short-acting opioids include: Tramadol (Ultram), Hydrocodone (Norco, Vicodin), Hydromorphone (Dilaudid), Oxycodone (Oxycontin)     The above directions have been adapted from the [employer] Surgical Patient Education Program.  Please refer to the ACS website if needed: https://[email or web address].org/-/media/files/education/[email or web address]-ed/[email or web address].[email or web address]   Frank Jolley, MD [facility] [address], [address], [place], [place]  [address] ?  [address] [address], , [place]   [address] [phone] ? [phone] ? FAX [phone] Web site: [email or web address]  

## 2021-03-01 NOTE — Transfer of Care (Signed)
Immediate Anesthesia Transfer of Care Note  Patient: Frank Howe  Procedure(s) Performed: ROBOTIC RECURRENT INCISIONAL HERNIA 9CMx9CM REPAIR WITH MESH AND REMOVAL OF INTRAPERITONEAL MESH (Abdomen)  Patient Location: PACU  Anesthesia Type:General  Level of Consciousness: drowsy  Airway & Oxygen Therapy: Patient Spontanous Breathing and Patient connected to face mask  Post-op Assessment: Report given to RN and Post -op Vital signs reviewed and stable  Post vital signs: Reviewed and stable  Last Vitals:  Vitals Value Taken Time  BP 152/101 03/01/21 1615  Temp    Pulse 110 03/01/21 1616  Resp 40 03/01/21 1616  SpO2 92 % 03/01/21 1616  Vitals shown include unvalidated device data.  Last Pain:  Vitals:   03/01/21 0948  TempSrc: Oral  PainSc:          Complications: No notable events documented.

## 2021-03-01 NOTE — Op Note (Addendum)
Patient: Frank Howe ([birth year], [id number])  Date of Surgery: 03/01/2021   Preoperative Diagnosis: RECURRENT INCISIONAL HERNIA WITH INCARCERATED BOWEL  Postoperative Diagnosis: RECURRENT 9cm x 9cm VENTRAL INCISIONAL HERNIA WITH INCARCERATED BOWEL  Surgical Procedure:  ROBOTIC RECURRENT INCISIONAL HERNIA 9CMx9CM REPAIR WITH MESH REMOVAL OF INTRAPERITONEAL MESH BILATERAL POSTERIOR RECTUS SHEATH MYOFASCIAL RELEASES 2 HOUR LYSIS OF ADHESIONS BETWEEN THE BOWEL, OMENTUM AND PREVIOUS INTRAPERITONEAL ONLAY MESHES  Operative Team Members:  Surgeon(s) and Role:    * Aarish Rockers, Hyman Hopes, MD - Primary   Anesthesiologist: Elmer Picker, MD CRNA: Bishop Limbo, CRNA; Nelle Don, CRNA; Sudie Grumbling, CRNA   Anesthesia: General   Fluids:  Total I/O In: 2100 [I.V.:2100] Out: 600 [Urine:550; Blood:50]  Complications: None  Drains:  None  Specimen: None  Disposition:  PACU - hemodynamically stable.  Plan of Care: Admit to inpatient   Indications for Procedure: Frank Howe is a 45 y.o. male who presented with a recurrent ventral hernia with incarcerated bowel and a bowel obstruction.  I recommended robotic ventral hernia repair with mesh.  The procedure itself as well as the risks, benefits and alternatives were described.  The risks discussed included but were not limited to the risk of infection, bleeding, damage to nearby structures, recurrent hernia, chronic pain, and mesh complication requiring removal.  After a full discussion and all questions answered, the patient granted consent to proceed.  Findings:  Hernia Location: Ventral hernia location: Umbilical (M3) Hernia Size:  9 cm x 9 cm  Mesh Size &Type:   16.5 cm wide x 30 cm tall Bard Soft Mesh retromuscular position 15cm x 15 cm vicryl mesh patch to recreate the visceral sac and patch the hernia defect at the level of the peritoneum  Mesh Position: Sublay - Retromuscular Myofascial Releases:  Bilateral Myofascial Release: Posterior rectus myofascial release   Description of Procedure: The patient was positioned supine, moderately flexed at the umbilical level, padded and secured on the operating table.  A timeout procedure was performed.    What is described is a robotic, totally extraperitoneal retromuscular incisional hernia repair with bilateral rectus myofascial release and retromuscular mesh placement.  Laparoscopic Portion: The retrorectus space was entered in the LEFT hypochondrium, at approximately the midclavicular line utilizing a 5 mm optical-viewing trocar.  Upon safe entry into this space, it was insufflated while performing a blunt dissection with the camera still in the optical trocar. A rectus myofascial release was performed on the LEFT side. Dissection was carried out laterally in the retromuscular plane to the edge of the rectus sheath progressively disconnecting the rectus muscle from the underlying posterior rectus sheath. Both the segmental innervation as well as the intercostal artery and vein brances to the rectus muscle were individually preserved.    During the left sided retrorectus dissection, a 12 mm trocar was placed into the lateral most edge of the retrorectus space.  With these initial trocars in position, the medial most aspect of the retrorectus plane was identified, and the posterior sheath was visualized as it inserted on the linea alba. The posterior sheath was incised with cautery entering the preperitoneal plane. A crossover was performed dissecting under the linea alba in the preperitoneal plane until the right rectus sheath was identified.  After identification of the right rectus sheath, it was incised vertically to enter the retrorectus space on the right. A rectus myofascial release was performed on the RIGHT side.  Blunt dissection was carried out laterally in the retromuscular plane to  the edge of the rectus sheath progressively disconnecting  the rectus muscle from the underlying posterior rectus sheath. Both the segmental innervation as well as the intercostal artery and vein brances to the rectus muscle were individually preserved.   At this juncture, both retrorectus planes were initially connected to each other and there was space for additional trocar placement. An 8 mm robotic trocar was placed in the midclavicular line in right retrorectus space.  A 73mm robotic trocar was placed within the right rectus musculature in the upper abdomen, and not through the linea alba.  The initial 5 mm access trocar in the midclavicular line within the left retrorectus space was switched out for an 8 mm robotic trocar.   Robotic Portion: The Intuitive daVinci Xi surgical robot was docked in the standard fashion and the procedure begun from the robotic console. A Prograsp instrument and monopolar shears were used for the dissection.  Dissection was carried down inferiorly preserving the peritoneum and the preperitoneal fat in the midline as it was gently dissected off of the overlying linea alba.  On the right side, the posterior rectus sheath was progressively disconnected from its insertion on the linea alba. This allowed for progression of the right side rectus myofascial release.  The rectus myofascial release accomplished medialization of the posterior rectus sheath towards the midline and disinsertion of the rectus muscle from its surrounding fascia, and thus its encasement in the rectus sheath, allowing for widening of the rectus muscle and transfer of the rectus flap towards the midline.  This will allow for future inset of the medial aspect of the flap for abdominal wall reconstruction.  Similarly, on the left side, the posterior rectus sheath was also progressively disconnected from its insertion on the linea alba.  This allowed for progression of the left side rectus myofascial release.  The rectus myofascial release accomplished medialization of  the posterior rectus sheath towards the midline and disinsertion of the rectus muscle from its surrounding fascia, and thus its encasement in the rectus sheath, allowing for widening of the rectus muscle and transfer of the rectus flap towards the midline.  This will allow for future inset of the medial aspect of the flap for abdominal wall reconstruction.  During the dissection of the midline the hernia defect was identified and the hernia sac was not reducible, therefore the hernia peritoneum was incised circumferentially around the edge of the hernia defect which left a defect within the peritoneum in the midline.    Working through the peritoneal defect, the omentum and bowel contained in the hernia sac were carefully dissected out of the hernia and reduced back into the abdomen.  This was complicated by the previous intra-peritoneal meshes.  The hernia had recurred inferiorly and to the left of two intraperitoneal onlay meshes.  These meshes were densely adherent to a loop of intestine and the omentum. Approximately 2 hours were spent lysing these adhesions being careful not to injure the intestines.  Once the bowel and omentum were freed from the anterior abdominal wall, the meshes were removed from the abdominal wall to prevent recurrent adhesive disease.  These were removed through the 62mm assistant port with photograph included below.  The defect in the peritoneum, and the weakness in the posterior layer left behind by removing the mesh was patched using a 15cm by 15 cm vicryl mesh sewn in place with 0 V-loc suture.  At this point, the posterior layer was closed, recreating the visceral sac, and we continued with  the remainder of the repair.    Both the left and the right rectus myofascial releases were performed towards the lower abdomen, past the arcuate line bilaterally.  During this dissection, the peritoneum and preperitoneal fat in the midline were further preserved below the hernia as they  were dissected off of the overlying linea alba.   The hernia defect area was now visualized fully.  The hernia defects were located in the umbilical region.  Utilizing a metric ruler, the defect are was measured intracorporeally to be 9 cm horizontal by 9 cm vertical.  The hernia defect was closed utilizing a continuous, #1 Ethicon Stratafix Symmetric PDS Plus suture.  The hernia defect, and subsequently the rectus musculature, came together well for a complete abdominal wall reconstruction.  The dissected out retrorectus space was measured with a metric ruler so as to determine the size of the proposed mesh.    The robot was undocked and the laparoscope was inserted, inspecting for hemostasis.  The mesh deployment was performed laparoscopically.  Laparoscopic Portion:  A transversus abdominis plane (TAP) block was performed bilaterally with a mixture of marcaine and Exparel.  The anesthetic was first injected into the plane between the transversus abdominis and internal abdominal oblique muscles on the left. The TAP was repeated on the contralateral side.   A piece of Bard Soft was opened and trimmed to 16.5cm wide x 30 cm tall. The mesh was advanced into the retrorectus space and the mesh positioned flat against the intact posterior rectus sheaths. The mesh was not fixated as it occupied the entire retromuscular plane, and also covered all of the trocars.  The trocars were removed and the skin closed with 4-0 Monocryl subcuticular sutures and skin glue.   Ivar Drape, MD General, Bariatric, & Minimally Invasive Surgery [facility], [place]

## 2021-03-01 NOTE — Anesthesia Preprocedure Evaluation (Addendum)
Anesthesia Evaluation  Patient identified by MRN, date of birth, ID band Patient awake    Reviewed: Allergy & Precautions, NPO status , Patient's Chart, lab work & pertinent test results  Airway Mallampati: II  TM Distance: >3 FB Neck ROM: Full    Dental  (+) Chipped, Dental Advisory Given,    Pulmonary sleep apnea and Continuous Positive Airway Pressure Ventilation , former smoker,    Pulmonary exam normal breath sounds clear to auscultation       Cardiovascular hypertension, Pt. on medications Normal cardiovascular exam Rhythm:Regular Rate:Normal     Neuro/Psych negative neurological ROS  negative psych ROS   GI/Hepatic negative GI ROS, Neg liver ROS,   Endo/Other  diabetesMorbid obesity (BMI 49)  Renal/GU negative Renal ROS  negative genitourinary   Musculoskeletal negative musculoskeletal ROS (+)   Abdominal   Peds  Hematology negative hematology ROS (+)   Anesthesia Other Findings   Reproductive/Obstetrics                           Anesthesia Physical Anesthesia Plan  ASA: 3  Anesthesia Plan: General   Post-op Pain Management:    Induction: Intravenous  PONV Risk Score and Plan: 2 and Midazolam, Dexamethasone and Ondansetron  Airway Management Planned: Oral ETT  Additional Equipment:   Intra-op Plan:   Post-operative Plan: Extubation in OR  Informed Consent: I have reviewed the patients History and Physical, chart, labs and discussed the procedure including the risks, benefits and alternatives for the proposed anesthesia with the patient or authorized representative who has indicated his/her understanding and acceptance.     Dental advisory given  Plan Discussed with: CRNA  Anesthesia Plan Comments:         Anesthesia Quick Evaluation

## 2021-03-01 NOTE — Anesthesia Postprocedure Evaluation (Signed)
Anesthesia Post Note  Patient: Frank Howe  Procedure(s) Performed: ROBOTIC RECURRENT INCISIONAL HERNIA 9CMx9CM REPAIR WITH MESH AND REMOVAL OF INTRAPERITONEAL MESH (Abdomen)     Patient location during evaluation: PACU Anesthesia Type: General Level of consciousness: awake and alert Pain management: pain level controlled Vital Signs Assessment: post-procedure vital signs reviewed and stable Respiratory status: spontaneous breathing, nonlabored ventilation, respiratory function stable and patient connected to nasal cannula oxygen Cardiovascular status: blood pressure returned to baseline and stable Postop Assessment: no apparent nausea or vomiting Anesthetic complications: no   No notable events documented.  Last Vitals:  Vitals:   03/01/21 1744 03/01/21 1843  BP: (!) 159/101 (!) 162/102  Pulse: (!) 103 (!) 102  Resp: 18 18  Temp: (!) 36.3 C   SpO2: 96% 96%    Last Pain:  Vitals:   03/01/21 1842  TempSrc:   PainSc: 10-Worst pain ever                 Larnell Granlund L Gedalia Mcmillon

## 2021-03-01 NOTE — Anesthesia Procedure Notes (Signed)
Procedure Name: Intubation Date/Time: 03/01/2021 10:29 AM Performed by: Rosaland Lao, CRNA Pre-anesthesia Checklist: Patient identified, Emergency Drugs available, Suction available and Patient being monitored Patient Re-evaluated:Patient Re-evaluated prior to induction Oxygen Delivery Method: Circle system utilized Preoxygenation: Pre-oxygenation with 100% oxygen Induction Type: IV induction and Rapid sequence Ventilation: Two handed mask ventilation required Laryngoscope Size: Mac and 4 Grade View: Grade II Tube type: Oral Tube size: 7.5 mm Number of attempts: 1 Airway Equipment and Method: Stylet Placement Confirmation: ETT inserted through vocal cords under direct vision, positive ETCO2 and breath sounds checked- equal and bilateral Secured at: 22 cm Tube secured with: Tape Dental Injury: Teeth and Oropharynx as per pre-operative assessment

## 2021-03-01 NOTE — H&P (Signed)
Admitting Physician: Nickola Major Shadi Sessler  Service: General Surgery  CC: Hernia  Subjective   HPI: Frank Howe is an 45 y.o. male who is here for elective robotic, possibly open, recurrent ventral hernia repair with mesh.  Past Medical History:  Diagnosis Date   Diabetes mellitus type 2 in obese ([place]) 01/15/2021   Elevated blood pressure reading without diagnosis of hypertension    Gout    History of kidney stones    Hypertension    Impaired fasting blood sugar    Obesity    OSA (obstructive sleep apnea)     Past Surgical History:  Procedure Laterality Date   UMBILICAL HERNIA REPAIR N/A 09/14/2017   Procedure: LAPAROSCOPIC UMBILICAL HERNIA;  Surgeon: Leighton Ruff, MD;  Location: [facility];  Service: General;  Laterality: N/A;   UMBILICAL HERNIA REPAIR N/A 02/11/2018   Procedure: LAPARASCOPIC REDO REPAIR OF RECURRENT UMBILICAL HERNIA WITH INSERTION OF MESH, LYSIS OF ADHESIONS;  Surgeon: Ileana Roup, MD;  Location: [facility];  Service: General;  Laterality: N/A;    Family History  Problem Relation Age of Onset   Arthritis Mother    Hypertension Mother    Stroke Mother    Aneurysm Mother        died of brain aneurysm   Asthma Father    Cancer Father        ?   Arthritis Father    Heart disease Maternal Uncle    Diabetes Maternal Uncle    Alcohol abuse Neg Hx    Hyperlipidemia Neg Hx    Hearing loss Neg Hx    Kidney disease Neg Hx    Early death Neg Hx     Social:  reports that he quit smoking about 6 years ago. His smoking use included cigarettes. He has a 1.00 pack-year smoking history. He has never used smokeless tobacco. He reports current alcohol use. He reports that he does not use drugs.  Allergies: No Known Allergies  Medications: Current Outpatient Medications  Medication Instructions   amLODipine (NORVASC) 5 mg, Oral, Daily   colchicine 0.6 mg, Oral, Daily PRN   indomethacin (INDOCIN) 50 mg, Oral, 3 times daily with meals   ondansetron  (ZOFRAN) 4 mg, Oral, Every 6 hours PRN    ROS - all of the below systems have been reviewed with the patient and positives are indicated with bold text General: chills, fever or night sweats Eyes: blurry vision or double vision ENT: epistaxis or sore throat Allergy/Immunology: itchy/watery eyes or nasal congestion Hematologic/Lymphatic: bleeding problems, blood clots or swollen lymph nodes Endocrine: temperature intolerance or unexpected weight changes Breast: new or changing breast lumps or nipple discharge Resp: cough, shortness of breath, or wheezing CV: chest pain or dyspnea on exertion GI: as per HPI GU: dysuria, trouble voiding, or hematuria MSK: joint pain or joint stiffness Neuro: TIA or stroke symptoms Derm: pruritus and skin lesion changes Psych: anxiety and depression  Objective   PE There were no vitals taken for this visit. Constitutional: NAD; conversant; no deformities Eyes: Moist conjunctiva; no lid lag; anicteric; PERRL Neck: Trachea midline; no thyromegaly Lungs: Normal respiratory effort; no tactile fremitus CV: RRR; no palpable thrills; no pitting edema GI: Abd Soft, non-tender; no palpable hepatosplenomegaly MSK: Normal range of motion of extremities; no clubbing/cyanosis Psychiatric: Appropriate affect; alert and oriented x3 Lymphatic: No palpable cervical or axillary lymphadenopathy  No results found for this or any previous visit (from the past 24 hour(s)).  Imaging Orders  CT 01/14/21  1. Small-bowel obstruction with transition point within periumbilical hernia extending to the left of midline. Mild mesenteric edema with trace mesenteric free fluid, and free fluid tracking into the pelvis. No perforation or abscess. 2. Hepatomegaly and hepatic steatosis. 3. Possible gallstone without gallbladder inflammation.   Assessment and Plan   Frank Howe is an 45 y.o. male with a recurrent ventral incisional hernia.  His surgical history includes  the following: 02/11/2018 - Laparoscopic repair of recurrent 5cm x 5cm umbilical hernia with mesh by Dr. Cliffton Asters using 11.4x11.4 cm ventralight ST IPOM for an incarcerated recurrent umbilical hernia 09/14/2017 - Laparoscopic umbilical hernia repair with mesh 11.5 cm x 11.5 cm IPOM repair for umbilical hernia   Recurrent ventral incisional hernia  Morbid obesity with body mass index (BMI) of 45.0 to 49.9 in adult (CMS-HCC)   I recommended robotic, possibly open, recurrent ventral hernia repair with mesh. The procedure itself as well as its risks, benefits and alternatives were discussed with the patient. The risks discussed included but were not limited to the risk of infection, bleeding, damage to nearby structures, recurrent hernia, and mesh complication. After a full discussion and all questions answered the patient granted consent to proceed. We also discussed obesity as a risk factor for hernia recurrence. I discussed the spectrum of obesity treatments available. He would like a referral to work with the healthy weight and wellness team and be evaluated for medical weight loss options.      No diagnosis found.   Quentin Ore, MD  [facility] Use [email or web address] to contact on call provider

## 2021-03-02 ENCOUNTER — Encounter (HOSPITAL_COMMUNITY): Payer: Self-pay | Admitting: Surgery

## 2021-03-02 DIAGNOSIS — K430 Incisional hernia with obstruction, without gangrene: Secondary | ICD-10-CM | POA: Diagnosis not present

## 2021-03-02 LAB — BASIC METABOLIC PANEL
Anion gap: 9 (ref 5–15)
BUN: 15 mg/dL (ref 6–20)
CO2: 25 mmol/L (ref 22–32)
Calcium: 8 mg/dL — ABNORMAL LOW (ref 8.9–10.3)
Chloride: 100 mmol/L (ref 98–111)
Creatinine, Ser: 1.17 mg/dL (ref 0.61–1.24)
GFR, Estimated: >60 mL/min (ref >60)
Glucose, Bld: 141 mg/dL — ABNORMAL HIGH (ref 70–99)
Potassium: 4.2 mmol/L (ref 3.5–5.1)
Sodium: 134 mmol/L — ABNORMAL LOW (ref 135–145)

## 2021-03-02 LAB — CBC
HCT: 40.3 % (ref 39.0–52.0)
Hemoglobin: 12.6 g/dL — ABNORMAL LOW (ref 13.0–17.0)
MCH: 26.8 pg (ref 26.0–34.0)
MCHC: 31.3 g/dL (ref 30.0–36.0)
MCV: 85.6 fL (ref 80.0–100.0)
Platelets: 278 10*3/uL (ref 150–400)
RBC: 4.71 MIL/uL (ref 4.22–5.81)
RDW: 14.6 % (ref 11.5–15.5)
WBC: 14.1 10*3/uL — ABNORMAL HIGH (ref 4.0–10.5)
nRBC: 0 % (ref 0.0–0.2)

## 2021-03-02 MED ORDER — OXYCODONE-ACETAMINOPHEN 5-325 MG PO TABS: 1.0000 | ORAL_TABLET | ORAL | 0 refills | Status: DC | PRN

## 2021-03-02 NOTE — Progress Notes (Signed)
   03/02/21 1300  Mobility  Activity Ambulated in hall  Level of Assistance Independent  Assistive Device None  Distance Ambulated (ft) 400 ft  Mobility Ambulated independently in hallway  Mobility Response Tolerated well  Mobility performed by Mobility specialist  $Mobility charge 1 Mobility   Pt agreeable to mobilizing. Ambulated about 418ft with no device in the hall, tolerated well. Left pt in chair with call bell at side. RN notified of session.   Timoteo Expose Mobility Specialist Acute Rehab Services Office: [phone]

## 2021-03-02 NOTE — Progress Notes (Signed)
PT Cancellation/Screen Note  Patient Details Name: Frank Howe MRN: [id number] DOB: [birth year]   Cancelled Treatment:    Reason Eval/Treat Not Completed: PT screened, no needs identified, will sign off. Per OT note, pt is Independent with mobility-no acute PT needs. Will sign off.    Faye Ramsay, PT Acute Rehabilitation  Office: [phone] Pager: [phone]

## 2021-03-02 NOTE — Evaluation (Signed)
Occupational Therapy Evaluation Patient Details Name: Frank Howe MRN: [id number] DOB: 30-Oct-1975 Today's Date: 03/02/2021   History of Present Illness 45 y.o. male presented to ED 11/8 with recurrent incisional hernia with incarcerated bowel s/p robotic ventral hernia repair with mesh and removal of intraperitoneal mesh. PMHx significant for DMII, elevated BP, obesity and OSA.   Clinical Impression   PTA patient was living with his spouse and 2 young children in a private residence and was grossly I with ADLs/IADLs without AD. Patient currently functioning near baseline demonstrating observed ADLs including UB dressing and grooming standing at sink level with I and functional mobility up to 241ft in hallway without AD and I. Patient does not require continued acute occupational therapy services with OT to sign off. Recommend continued ambulation in hallway with nursing staff several times per day for length of admission.      Recommendations for follow up therapy are one component of a multi-disciplinary discharge planning process, led by the attending physician.  Recommendations may be updated based on patient status, additional functional criteria and insurance authorization.   Follow Up Recommendations  No OT follow up    Assistance Recommended at Discharge PRN  Functional Status Assessment  Patient has not had a recent decline in their functional status  Equipment Recommendations  None recommended by OT    Recommendations for Other Services       Precautions / Restrictions Precautions Precautions: None Restrictions Weight Bearing Restrictions: No      Mobility Bed Mobility Overal bed mobility: Independent                  Transfers Overall transfer level: Independent                        Balance Overall balance assessment: Mild deficits observed, not formally tested                                         ADL either performed  or assessed with clinical judgement   ADL Overall ADL's : At baseline                                             Vision Baseline Vision/History: 1 Wears glasses Ability to See in Adequate Light: 0 Adequate Patient Visual Report: No change from baseline Vision Assessment?: No apparent visual deficits     Perception     Praxis      Pertinent Vitals/Pain Pain Assessment: 0-10 Pain Score: 7  Pain Location: Abdomen Pain Descriptors / Indicators: Sore Pain Intervention(s): Monitored during session;Repositioned;Limited activity within patient's tolerance     Hand Dominance Left   Extremity/Trunk Assessment Upper Extremity Assessment Upper Extremity Assessment: Overall WFL for tasks assessed   Lower Extremity Assessment Lower Extremity Assessment: Overall WFL for tasks assessed   Cervical / Trunk Assessment Cervical / Trunk Assessment: Other exceptions Cervical / Trunk Exceptions: s/p abdomnal surgery; large body habitus   Communication Communication Communication: No difficulties   Cognition Arousal/Alertness: Awake/alert Behavior During Therapy: WFL for tasks assessed/performed Overall Cognitive Status: Within Functional Limits for tasks assessed  General Comments  SpO2 02% on RA. HR 90's with light activity.    Exercises     Shoulder Instructions      Home Living Family/patient expects to be discharged to:: Private residence Living Arrangements: Spouse/significant other;Children Available Help at Discharge: Family Type of Home: House Home Access: Level entry     Home Layout: One level     Bathroom Shower/Tub: Chief Strategy Officer: Standard     Home Equipment: None          Prior Functioning/Environment Prior Level of Function : Independent/Modified Independent;Working/employed             Mobility Comments: Has not worked since 11/2020. I with  household/community mobility ADLs Comments: I with ADLs/IADLs including cooking/cleaning        OT Problem List: Pain      OT Treatment/Interventions:      OT Goals(Current goals can be found in the care plan section) Acute Rehab OT Goals Patient Stated Goal: To return home. OT Goal Formulation: With patient  OT Frequency:     Barriers to D/C:            Co-evaluation              AM-PAC OT "6 Clicks" Daily Activity     Outcome Measure Help from another person eating meals?: None Help from another person taking care of personal grooming?: None Help from another person toileting, which includes using toliet, bedpan, or urinal?: None Help from another person bathing (including washing, rinsing, drying)?: None Help from another person to put on and taking off regular upper body clothing?: None Help from another person to put on and taking off regular lower body clothing?: A Little 6 Click Score: 23   End of Session Equipment Utilized During Treatment: Gait belt Nurse Communication: Mobility status;Other (comment) (Response to treatment.)  Activity Tolerance: Patient tolerated treatment well Patient left: in chair;with call bell/phone within reach  OT Visit Diagnosis: Pain Pain - part of body:  (abdomen)                Time: 2671-2458 OT Time Calculation (min): 24 min Charges:  OT General Charges $OT Visit: 1 Visit OT Evaluation $OT Eval Low Complexity: 1 Low OT Treatments $Self Care/Home Management : 8-22 mins  Reeve Mallo H. OTR/L Supplemental OT, Department of rehab services [phone]  Vaani Morren R H. 03/02/2021, 7:48 AM

## 2021-03-02 NOTE — Progress Notes (Signed)
Reviewed written d/c instructions w pt and his sign other and all questions answered. They both verbalized understanding. D/C per w/c w all belongings in stable condition.

## 2021-03-02 NOTE — Discharge Summary (Signed)
  Patient ID: Frank Howe [id number] 44 y.o. [birth year]  03/01/2021  Discharge date and time: 03/02/2021  Admitting Physician: Frank Howe  Discharge Physician: Frank Howe  Admission Diagnoses: Recurrent incisional hernia [K43.2] Patient Active Problem List   Diagnosis Date Noted   Recurrent incisional hernia 03/01/2021   SBO (small bowel obstruction) (HCC) 01/15/2021   Essential hypertension 01/15/2021   Diabetes mellitus type 2 in obese (HCC) 01/15/2021   Obesity, Class III, BMI 40-49.9 (morbid obesity) (HCC) 01/15/2021   Umbilical hernia with obstruction but no gangrene 02/11/2018   Partial small bowel obstruction (HCC) 09/13/2017   Gout, chronic 07/19/2015   OSA (obstructive sleep apnea) 07/19/2015   Obesity 06/29/2015   Elevated uric acid in blood 06/29/2015   Impaired fasting blood sugar 06/28/2015   Knee swelling 06/28/2015   Right knee pain 06/28/2015   Elevated blood pressure reading without diagnosis of hypertension 06/28/2015   Routine general medical examination at a health care facility 08/21/2011     Discharge Diagnoses: Recurrent ventral incisional hernia 9cm x 9cm Patient Active Problem List   Diagnosis Date Noted   Recurrent incisional hernia 03/01/2021   SBO (small bowel obstruction) (HCC) 01/15/2021   Essential hypertension 01/15/2021   Diabetes mellitus type 2 in obese (HCC) 01/15/2021   Obesity, Class III, BMI 40-49.9 (morbid obesity) (HCC) 01/15/2021   Umbilical hernia with obstruction but no gangrene 02/11/2018   Partial small bowel obstruction (HCC) 09/13/2017   Gout, chronic 07/19/2015   OSA (obstructive sleep apnea) 07/19/2015   Obesity 06/29/2015   Elevated uric acid in blood 06/29/2015   Impaired fasting blood sugar 06/28/2015   Knee swelling 06/28/2015   Right knee pain 06/28/2015   Elevated blood pressure reading without diagnosis of hypertension 06/28/2015   Routine general medical examination at a health care  facility 08/21/2011    Operations: Procedure(s): ROBOTIC RECURRENT INCISIONAL HERNIA 9CMx9CM REPAIR WITH MESH AND REMOVAL OF INTRAPERITONEAL MESH  Admission Condition: good  Discharged Condition: good  Indication for Admission: Recurrent ventral incisional hernia  Hospital Course: Frank Howe presented for elective repair of his hernia and was discharged the following day.  Consults: None  Significant Diagnostic Studies: none  Treatments: surgery: as above  Disposition: Home  Patient Instructions:  Allergies as of 03/02/2021   No Known Allergies      Medication List     TAKE these medications    amLODipine 5 MG tablet Commonly known as: NORVASC Take 5 mg by mouth daily.   colchicine 0.6 MG tablet Take 1 tablet (0.6 mg total) by mouth daily as needed (gout flare). What changed: when to take this   indomethacin 50 MG capsule Commonly known as: INDOCIN Take 1 capsule (50 mg total) by mouth 3 (three) times daily with meals.   ondansetron 4 MG tablet Commonly known as: ZOFRAN Take 1 tablet (4 mg total) by mouth every 6 (six) hours as needed for nausea.   oxyCODONE-acetaminophen 5-325 MG tablet Commonly known as: Percocet Take 1 tablet by mouth every 4 (four) hours as needed for severe pain.        Activity: activity as tolerated and no heavy lifting for 4 weeks Diet: regular diet Wound Care: keep wound clean and dry  Follow-up:  With Dr. Dossie Der in 4 weeks.  Signed: Hyman Hopes Tariyah Howe General, Bariatric, [facility] Minimally Invasive Surgery [facility], [place]   03/02/2021, 8:43 AM

## 2021-07-13 ENCOUNTER — Ambulatory Visit
Admission: RE | Admit: 2021-07-13 | Discharge: 2021-07-13 | Disposition: A | Discharge status: Home / Self Care | Payer: Medicaid Other | Source: Ambulatory Visit | Attending: Family Medicine | Admitting: Family Medicine

## 2021-07-13 ENCOUNTER — Other Ambulatory Visit: Payer: Self-pay | Admitting: Family Medicine

## 2021-07-13 DIAGNOSIS — M25559 Pain in unspecified hip: Secondary | ICD-10-CM

## 2021-11-14 ENCOUNTER — Ambulatory Visit
Admission: EM | Admit: 2021-11-14 | Discharge: 2021-11-14 | Disposition: A | Discharge status: Home / Self Care | Payer: Medicaid Other

## 2021-11-14 DIAGNOSIS — R197 Diarrhea, unspecified: Secondary | ICD-10-CM

## 2021-11-14 DIAGNOSIS — Z8719 Personal history of other diseases of the digestive system: Secondary | ICD-10-CM | POA: Diagnosis not present

## 2021-11-14 DIAGNOSIS — Z9889 Other specified postprocedural states: Secondary | ICD-10-CM

## 2021-11-14 DIAGNOSIS — R112 Nausea with vomiting, unspecified: Secondary | ICD-10-CM | POA: Diagnosis not present

## 2021-11-14 MED ORDER — ONDANSETRON HCL 4 MG/2ML IJ SOLN
8.0000 mg | Freq: Once | INTRAMUSCULAR | Status: AC
  Administered 2021-11-14: 8 mg via INTRAMUSCULAR

## 2021-11-14 MED ORDER — LOPERAMIDE HCL 2 MG PO TABS: 4.0000 mg | ORAL_TABLET | Freq: Four times a day (QID) | ORAL | 0 refills | Status: AC | PRN

## 2021-11-14 MED ORDER — ONDANSETRON 8 MG PO TBDP: 8.0000 mg | ORAL_TABLET | Freq: Three times a day (TID) | ORAL | 0 refills | Status: AC | PRN

## 2021-11-14 MED ORDER — ONDANSETRON HCL 4 MG/2ML IJ SOLN: 4.0000 mg | Freq: Once | INTRAMUSCULAR | Status: DC

## 2021-11-14 NOTE — Discharge Instructions (Signed)
You received an injection of Zofran during your visit today to address your nausea quickly.  I also sent a prescription for Zofran to your pharmacy, your next dose will be due at 6 PM and you can continue taking it every 8 hours as needed for nausea symptoms.  I have sent a prescription for Imodium to your pharmacy as well, please take 2 tablets every time you have a loose bowel movement until they have resolved.  Please do not take more than 4 doses in a 24-hour period.  If you have worsening pain in your abdomen and find that your symptoms do not resolve with these 2 medications, please go to the emergency room for further evaluation.  The CT scan of your abdomen may be required to rule out bowel obstruction.  Thank you for visiting urgent care today.  We hope you feel better soon.

## 2021-11-14 NOTE — ED Provider Notes (Signed)
[facility]    CSN: [id number] Arrival date & time: 11/14/21  9326    HISTORY   Chief Complaint  Patient presents with   Vomiting   Diarrhea   HPI Frank Howe is a pleasant, 46 y.o. male who presents to urgent care today. Patient presents to urgent care complaining of nausea, greater than 5 episodes of vomiting and diarrhea (each) that began last night, states he is not tolerating any p.o. intake at this time.  Patient reports history of hernia repair and states he intermittently has episodes of bowel obstruction, states he is here today to make sure that does not was going on right now.  Patient is with his wife today who states that previous episodes of bowel obstruction were not accompanied by diarrhea.  Patient has essentially normal vital signs on arrival today and appears to be in no acute distress.  Patient is requesting a note for work.  The history is provided by the patient.   Past Medical History:  Diagnosis Date   Diabetes mellitus type 2 in obese (HCC) 01/15/2021   Elevated blood pressure reading without diagnosis of hypertension    Gout    History of kidney stones    Hypertension    Impaired fasting blood sugar    Obesity    OSA (obstructive sleep apnea)    Patient Active Problem List   Diagnosis Date Noted   Recurrent incisional hernia 03/01/2021   SBO (small bowel obstruction) (HCC) 01/15/2021   Essential hypertension 01/15/2021   Diabetes mellitus type 2 in obese (HCC) 01/15/2021   Obesity, Class III, BMI 40-49.9 (morbid obesity) (HCC) 01/15/2021   Umbilical hernia with obstruction but no gangrene 02/11/2018   Partial small bowel obstruction (HCC) 09/13/2017   Gout, chronic 07/19/2015   OSA (obstructive sleep apnea) 07/19/2015   Obesity 06/29/2015   Elevated uric acid in blood 06/29/2015   Impaired fasting blood sugar 06/28/2015   Knee swelling 06/28/2015   Right knee pain 06/28/2015   Elevated blood pressure reading without diagnosis of  hypertension 06/28/2015   Routine general medical examination at a health care facility 08/21/2011   Past Surgical History:  Procedure Laterality Date   UMBILICAL HERNIA REPAIR N/A 09/14/2017   Procedure: LAPAROSCOPIC UMBILICAL HERNIA;  Surgeon: Romie Levee, MD;  Location: [facility];  Service: General;  Laterality: N/A;   UMBILICAL HERNIA REPAIR N/A 02/11/2018   Procedure: LAPARASCOPIC REDO REPAIR OF RECURRENT UMBILICAL HERNIA WITH INSERTION OF MESH, LYSIS OF ADHESIONS;  Surgeon: Andria Meuse, MD;  Location: [facility];  Service: General;  Laterality: N/A;   XI ROBOTIC ASSISTED VENTRAL HERNIA N/A 03/01/2021   Procedure: ROBOTIC RECURRENT INCISIONAL HERNIA 9CMx9CM REPAIR WITH MESH AND REMOVAL OF INTRAPERITONEAL MESH;  Surgeon: Quentin Ore, MD;  Location: [facility];  Service: General;  Laterality: N/A;    Home Medications    Prior to Admission medications   Medication Sig Start Date End Date Taking? Authorizing Provider  loperamide (IMODIUM A-D) 2 MG tablet Take 2 tablets (4 mg total) by mouth 4 (four) times daily as needed for diarrhea or loose stools. 11/14/21  Yes Theadora Rama Scales, PA-C  ondansetron (ZOFRAN-ODT) 8 MG disintegrating tablet Take 1 tablet (8 mg total) by mouth every 8 (eight) hours as needed for nausea or vomiting. 11/14/21  Yes Theadora Rama Scales, PA-C  amLODipine (NORVASC) 5 MG tablet Take 5 mg by mouth daily. 02/09/21   [provider]  colchicine 0.6 MG tablet Take 1 tablet (0.6 mg total)  by mouth daily as needed (gout flare). Patient taking differently: Take 0.6 mg by mouth daily. 01/17/21 03/01/21  Glade Lloyd, MD  losartan-hydrochlorothiazide (HYZAAR) 50-12.5 MG tablet Take 1 tablet by mouth daily. 10/01/21   [provider]  metFORMIN (GLUCOPHAGE-XR) 500 MG 24 hr tablet Take 500 mg by mouth daily. 07/18/21   [provider]    Family History Family History  Problem Relation Age of Onset   Arthritis Mother    Hypertension  Mother    Stroke Mother    Aneurysm Mother        died of brain aneurysm   Asthma Father    Cancer Father        ?   Arthritis Father    Heart disease Maternal Uncle    Diabetes Maternal Uncle    Alcohol abuse Neg Hx    Hyperlipidemia Neg Hx    Hearing loss Neg Hx    Kidney disease Neg Hx    Early death Neg Hx    Social History Social History   Tobacco Use   Smoking status: Former    Packs/day: 0.25    Years: 4.00    Total pack years: 1.00    Types: Cigarettes    Quit date: 12/29/2014    Years since quitting: 6.8   Smokeless tobacco: Never   Tobacco comments:    once every 2 wk  Vaping Use   Vaping Use: Never used  Substance Use Topics   Alcohol use: Yes    Comment: occ   Drug use: No   Allergies   Patient has no known allergies.  Review of Systems Review of Systems Pertinent findings revealed after performing a 14 point review of systems has been noted in the history of present illness.  Physical Exam Triage Vital Signs ED Triage Vitals  Enc Vitals Group     BP 02/18/21 0827 (!) 147/82     Pulse Rate 02/18/21 0827 72     Resp 02/18/21 0827 18     Temp 02/18/21 0827 98.3 F (36.8 C)     Temp Source 02/18/21 0827 Oral     SpO2 02/18/21 0827 98 %     Weight --      Height --      Head Circumference --      Peak Flow --      Pain Score 02/18/21 0826 5     Pain Loc --      Pain Edu? --      Excl. in GC? --   No data found.  Updated Vital Signs BP 137/74 (BP Location: Right Arm)   Pulse 73   Temp 99.3 F (37.4 C) (Oral)   Resp 20   SpO2 94%   Physical Exam Vitals and nursing note reviewed.  Constitutional:      General: He is awake. He is not in acute distress.    Appearance: Normal appearance. He is well-developed. He is morbidly obese. He is not ill-appearing.  HENT:     Head: Normocephalic and atraumatic.  Eyes:     General: Lids are normal.        Right eye: No discharge.        Left eye: No discharge.     Extraocular Movements:  Extraocular movements intact.     Conjunctiva/sclera: Conjunctivae normal.     Right eye: Right conjunctiva is not injected.     Left eye: Left conjunctiva is not injected.  Neck:  Trachea: Trachea and phonation normal.  Cardiovascular:     Rate and Rhythm: Normal rate and regular rhythm.     Pulses: Normal pulses.     Heart sounds: Normal heart sounds. No murmur heard.    No friction rub. No gallop.  Pulmonary:     Effort: Pulmonary effort is normal. No accessory muscle usage, prolonged expiration or respiratory distress.     Breath sounds: Normal breath sounds. No stridor, decreased air movement or transmitted upper airway sounds. No decreased breath sounds, wheezing, rhonchi or rales.  Chest:     Chest wall: No tenderness.  Abdominal:     General: Abdomen is flat. Bowel sounds are normal. There is no distension.     Palpations: Abdomen is soft.     Tenderness: There is generalized abdominal tenderness. There is no right CVA tenderness or left CVA tenderness.     Hernia: No hernia is present.  Musculoskeletal:        General: Normal range of motion.     Cervical back: Normal range of motion and neck supple. Normal range of motion.  Lymphadenopathy:     Cervical: No cervical adenopathy.  Skin:    General: Skin is warm and dry.     Findings: No erythema or rash.  Neurological:     General: No focal deficit present.     Mental Status: He is alert and oriented to person, place, and time.  Psychiatric:        Mood and Affect: Mood normal.        Behavior: Behavior normal. Behavior is cooperative.     Visual Acuity Right Eye Distance:   Left Eye Distance:   Bilateral Distance:    Right Eye Near:   Left Eye Near:    Bilateral Near:     UC Couse / Diagnostics / Procedures:     Radiology No results found.  Procedures Procedures (including critical care time) EKG  Pending results:  Labs Reviewed - No data to display  Medications Ordered in UC: Medications   ondansetron (ZOFRAN) injection 8 mg (8 mg Intramuscular Given 11/14/21 0959)    UC Diagnoses / Final Clinical Impressions(s)   I have reviewed the triage vital signs and the nursing notes.  Pertinent labs & imaging results that were available during my care of the patient were reviewed by me and considered in my medical decision making (see chart for details).    Final diagnoses:  Nausea vomiting and diarrhea  History of hernia repair   Physical exam today is unremarkable.  Patient advised that if he is truly concerned about having a bowel obstruction that he will need to go to the emergency room for further evaluation and possible CT scan if indicated.  Patient provided with a note to return to work, patient requesting to be out until July 27.  ED Prescriptions     Medication Sig Dispense Auth. Provider   loperamide (IMODIUM A-D) 2 MG tablet Take 2 tablets (4 mg total) by mouth 4 (four) times daily as needed for diarrhea or loose stools. 30 tablet Theadora Rama Scales, PA-C   ondansetron (ZOFRAN-ODT) 8 MG disintegrating tablet Take 1 tablet (8 mg total) by mouth every 8 (eight) hours as needed for nausea or vomiting. 20 tablet Theadora Rama Scales, PA-C      PDMP not reviewed this encounter.  Pending results:  Labs Reviewed - No data to display  Discharge Instructions:   Discharge Instructions      You received  an injection of Zofran during your visit today to address your nausea quickly.  I also sent a prescription for Zofran to your pharmacy, your next dose will be due at 6 PM and you can continue taking it every 8 hours as needed for nausea symptoms.  I have sent a prescription for Imodium to your pharmacy as well, please take 2 tablets every time you have a loose bowel movement until they have resolved.  Please do not take more than 4 doses in a 24-hour period.  If you have worsening pain in your abdomen and find that your symptoms do not resolve with these 2  medications, please go to the emergency room for further evaluation.  The CT scan of your abdomen may be required to rule out bowel obstruction.  Thank you for visiting urgent care today.  We hope you feel better soon.      Disposition Upon Discharge:  Condition: stable for discharge home  Patient presented with an acute illness with associated systemic symptoms and significant discomfort requiring urgent management. In my opinion, this is a condition that a prudent lay person (someone who possesses an average knowledge of health and medicine) may potentially expect to result in complications if not addressed urgently such as respiratory distress, impairment of bodily function or dysfunction of bodily organs.   Routine symptom specific, illness specific and/or disease specific instructions were discussed with the patient and/or caregiver at length.   As such, the patient has been evaluated and assessed, work-up was performed and treatment was provided in alignment with urgent care protocols and evidence based medicine.  Patient/parent/caregiver has been advised that the patient may require follow up for further testing and treatment if the symptoms continue in spite of treatment, as clinically indicated and appropriate.  Patient/parent/caregiver has been advised to return to the [facility] or PCP if no better; to PCP or the Emergency Department if new signs and symptoms develop, or if the current signs or symptoms continue to change or worsen for further workup, evaluation and treatment as clinically indicated and appropriate  The patient will follow up with their current PCP if and as advised. If the patient does not currently have a PCP we will assist them in obtaining one.   The patient may need specialty follow up if the symptoms continue, in spite of conservative treatment and management, for further workup, evaluation, consultation and treatment as clinically indicated and  appropriate.   Patient/parent/caregiver verbalized understanding and agreement of plan as discussed.  All questions were addressed during visit.  Please see discharge instructions below for further details of plan.  This office note has been dictated using Teaching laboratory technician.  Unfortunately, this method of dictation can sometimes lead to typographical or grammatical errors.  I apologize for your inconvenience in advance if this occurs.  Please do not hesitate to reach out to me if clarification is needed.      Theadora Rama Scales, PA-C 11/15/21 1432

## 2021-11-14 NOTE — ED Triage Notes (Signed)
Patient c/o nausea, vomiting and diarrhea that began last night.   Home interventions: none

## 2021-11-15 ENCOUNTER — Telehealth: Payer: Self-pay

## 2021-11-15 NOTE — Telephone Encounter (Signed)
The HR Manager at [facility] called to verify a work note that the patient was given on last visit to urgent care. They faxed over the letter for verification. The letter was seen by the provider and provider stated (on the cover sheet provided by the HR Manager of the facility) that the letter was authentic. No patient identifiers were stated on the fax and [place] cover letter was included.

## 2022-03-20 ENCOUNTER — Encounter: Payer: Self-pay | Admitting: Family Medicine

## 2022-03-20 ENCOUNTER — Other Ambulatory Visit: Payer: Self-pay | Admitting: Family Medicine

## 2022-03-20 DIAGNOSIS — R112 Nausea with vomiting, unspecified: Secondary | ICD-10-CM

## 2022-03-20 DIAGNOSIS — R197 Diarrhea, unspecified: Secondary | ICD-10-CM

## 2022-03-31 ENCOUNTER — Ambulatory Visit
Admission: RE | Admit: 2022-03-31 | Discharge: 2022-03-31 | Disposition: A | Discharge status: Home / Self Care | Payer: Medicaid Other | Source: Ambulatory Visit | Attending: Family Medicine | Admitting: Family Medicine

## 2022-03-31 DIAGNOSIS — R112 Nausea with vomiting, unspecified: Secondary | ICD-10-CM

## 2022-03-31 DIAGNOSIS — R197 Diarrhea, unspecified: Secondary | ICD-10-CM

## 2022-09-20 ENCOUNTER — Other Ambulatory Visit: Payer: Self-pay

## 2022-09-20 ENCOUNTER — Encounter (HOSPITAL_BASED_OUTPATIENT_CLINIC_OR_DEPARTMENT_OTHER): Payer: Self-pay

## 2022-09-20 ENCOUNTER — Emergency Department (HOSPITAL_BASED_OUTPATIENT_CLINIC_OR_DEPARTMENT_OTHER)
Admission: EM | Admit: 2022-09-20 | Discharge: 2022-09-20 | Disposition: A | Discharge status: Home / Self Care | Payer: Medicaid Other | Attending: Emergency Medicine | Admitting: Emergency Medicine

## 2022-09-20 DIAGNOSIS — R55 Syncope and collapse: Secondary | ICD-10-CM | POA: Diagnosis present

## 2022-09-20 DIAGNOSIS — Z7984 Long term (current) use of oral hypoglycemic drugs: Secondary | ICD-10-CM | POA: Diagnosis not present

## 2022-09-20 DIAGNOSIS — E119 Type 2 diabetes mellitus without complications: Secondary | ICD-10-CM | POA: Diagnosis not present

## 2022-09-20 DIAGNOSIS — Z79899 Other long term (current) drug therapy: Secondary | ICD-10-CM | POA: Diagnosis not present

## 2022-09-20 DIAGNOSIS — I10 Essential (primary) hypertension: Secondary | ICD-10-CM | POA: Insufficient documentation

## 2022-09-20 DIAGNOSIS — E860 Dehydration: Secondary | ICD-10-CM

## 2022-09-20 LAB — BASIC METABOLIC PANEL
Anion gap: 10 (ref 5–15)
BUN: 13 mg/dL (ref 6–20)
CO2: 24 mmol/L (ref 22–32)
Calcium: 8.4 mg/dL — ABNORMAL LOW (ref 8.9–10.3)
Chloride: 103 mmol/L (ref 98–111)
Creatinine, Ser: 1.31 mg/dL — ABNORMAL HIGH (ref 0.61–1.24)
GFR, Estimated: >60 mL/min (ref >60)
Glucose, Bld: 131 mg/dL — ABNORMAL HIGH (ref 70–99)
Potassium: 3.5 mmol/L (ref 3.5–5.1)
Sodium: 137 mmol/L (ref 135–145)

## 2022-09-20 LAB — CBC WITH DIFFERENTIAL/PLATELET
Abs Immature Granulocytes: 0.03 10*3/uL (ref 0.00–0.07)
Basophils Absolute: 0 10*3/uL (ref 0.0–0.1)
Basophils Relative: 0 %
Eosinophils Absolute: 0.2 10*3/uL (ref 0.0–0.5)
Eosinophils Relative: 2 %
HCT: 40.5 % (ref 39.0–52.0)
Hemoglobin: 12.8 g/dL — ABNORMAL LOW (ref 13.0–17.0)
Immature Granulocytes: 0 %
Lymphocytes Relative: 39 %
Lymphs Abs: 4.2 10*3/uL — ABNORMAL HIGH (ref 0.7–4.0)
MCH: 26.4 pg (ref 26.0–34.0)
MCHC: 31.6 g/dL (ref 30.0–36.0)
MCV: 83.5 fL (ref 80.0–100.0)
Monocytes Absolute: 0.9 10*3/uL (ref 0.1–1.0)
Monocytes Relative: 8 %
Neutro Abs: 5.4 10*3/uL (ref 1.7–7.7)
Neutrophils Relative %: 51 %
Platelets: 283 10*3/uL (ref 150–400)
RBC: 4.85 MIL/uL (ref 4.22–5.81)
RDW: 14 % (ref 11.5–15.5)
WBC: 10.7 10*3/uL — ABNORMAL HIGH (ref 4.0–10.5)
nRBC: 0 % (ref 0.0–0.2)

## 2022-09-20 LAB — CBG MONITORING, ED: Glucose-Capillary: 144 mg/dL — ABNORMAL HIGH (ref 70–99)

## 2022-09-20 MED ORDER — SODIUM CHLORIDE 0.9 % IV BOLUS
1000.0000 mL | Freq: Once | INTRAVENOUS | Status: AC
  Administered 2022-09-20: 1000 mL via INTRAVENOUS

## 2022-09-20 NOTE — Discharge Instructions (Signed)
As we discussed, your workup in the ER today was reassuring for acute findings.  Laboratory evaluation did not reveal any emergent concerns.  Given that you feel better after fluids, suspect that your symptoms are due to dehydration.  It is very important that you maintain adequate hydration especially if working in a hot environment.  It is also important to drink fluids with electrolytes such as sports drinks and not just water.  Please call your primary care doctor in the next few days to schedule an appointment for follow-up.  Return if development of any new or worsening symptoms.

## 2022-09-20 NOTE — ED Notes (Signed)
Patient laid down flat for preparation for orthostatic vital signs.

## 2022-09-20 NOTE — ED Triage Notes (Signed)
Pt states that he stood up too quickly at work and had a syncopal episode.

## 2022-09-20 NOTE — ED Provider Notes (Signed)
EMERGENCY DEPARTMENT AT [facility] Provider Note   CSN: [id number] Arrival date & time: 09/20/22  1927     History  Chief Complaint  Patient presents with   Loss of Consciousness    Frank Howe is a 47 y.o. male.  Patient with history of diabetes, hypertension, OSA, morbid obesity presents today with complaints of syncope.  He states that same occurred earlier today when he was at work.  He states he works in a [employer] and has not had any water to drink today.  He was sitting down to eat lunch and got up and suddenly felt nauseous and lightheaded and subsequently lost consciousness.  He did not hit his head.  Denies any injuries from falling.  He is not anticoagulated.  He currently denies any symptoms.  Specifically, denies headache, vision changes, fevers, chills, chest pain, shortness of breath, nausea, vomiting, diarrhea, or abdominal pain.  Denies any history of similar symptoms previously.  The history is provided by the patient. No language interpreter was used.  Loss of Consciousness      Home Medications Prior to Admission medications   Medication Sig Start Date End Date Taking? Authorizing Provider  amLODipine (NORVASC) 5 MG tablet Take 5 mg by mouth daily. 02/09/21   [provider]  colchicine 0.6 MG tablet Take 1 tablet (0.6 mg total) by mouth daily as needed (gout flare). Patient taking differently: Take 0.6 mg by mouth daily. 01/17/21 03/01/21  Glade Lloyd, MD  loperamide (IMODIUM A-D) 2 MG tablet Take 2 tablets (4 mg total) by mouth 4 (four) times daily as needed for diarrhea or loose stools. 11/14/21   Theadora Rama Scales, PA-C  losartan-hydrochlorothiazide (HYZAAR) 50-12.5 MG tablet Take 1 tablet by mouth daily. 10/01/21   [provider]  metFORMIN (GLUCOPHAGE-XR) 500 MG 24 hr tablet Take 500 mg by mouth daily. 07/18/21   [provider]  ondansetron (ZOFRAN-ODT) 8 MG disintegrating tablet Take 1 tablet (8  mg total) by mouth every 8 (eight) hours as needed for nausea or vomiting. 11/14/21   Theadora Rama Scales, PA-C      Allergies    Patient has no known allergies.    Review of Systems   Review of Systems  Cardiovascular:  Positive for syncope.  Neurological:  Positive for syncope.  All other systems reviewed and are negative.   Physical Exam Updated Vital Signs BP 116/62   Pulse 91   Temp 98 F (36.7 C) (Oral)   Resp (!) 27   Ht 5\' 8"  (1.727 m)   Wt 129.7 kg   SpO2 97%   BMI 43.49 kg/m  Physical Exam Vitals and nursing note reviewed.  Constitutional:      General: He is not in acute distress.    Appearance: Normal appearance. He is normal weight. He is not ill-appearing, toxic-appearing or diaphoretic.  HENT:     Head: Normocephalic and atraumatic.     Comments: No battles sign or racoon eyes Eyes:     Extraocular Movements: Extraocular movements intact.     Pupils: Pupils are equal, round, and reactive to light.  Cardiovascular:     Rate and Rhythm: Normal rate and regular rhythm.     Heart sounds: Normal heart sounds.  Pulmonary:     Effort: Pulmonary effort is normal. No respiratory distress.     Breath sounds: Normal breath sounds.  Abdominal:     General: Abdomen is flat.     Palpations: Abdomen is soft.  Tenderness: There is no abdominal tenderness.  Musculoskeletal:        General: Normal range of motion.     Cervical back: Normal range of motion and neck supple.     Comments: No tenderness to palpation of cervical, thoracic, or lumbar spine.  No leg swelling or tenderness to palpation  Skin:    General: Skin is warm and dry.  Neurological:     General: No focal deficit present.     Mental Status: He is alert and oriented to person, place, and time.  Psychiatric:        Mood and Affect: Mood normal.        Behavior: Behavior normal.     ED Results / Procedures / Treatments   Labs (all labs ordered are listed, but only abnormal results are  displayed) Labs Reviewed  BASIC METABOLIC PANEL - Abnormal; Notable for the following components:      Result Value   Glucose, Bld 131 (*)    Creatinine, Ser 1.31 (*)    Calcium 8.4 (*)    All other components within normal limits  CBC WITH DIFFERENTIAL/PLATELET - Abnormal; Notable for the following components:   WBC 10.7 (*)    Hemoglobin 12.8 (*)    Lymphs Abs 4.2 (*)    All other components within normal limits  CBG MONITORING, ED - Abnormal; Notable for the following components:   Glucose-Capillary 144 (*)    All other components within normal limits    EKG EKG Interpretation  Date/Time:  Wednesday Sep 20 2022 19:48:56 EDT Ventricular Rate:  96 PR Interval:  147 QRS Duration: 86 QT Interval:  336 QTC Calculation: 425 R Axis:   31 Text Interpretation: Sinus rhythm Abnormal R-wave progression, early transition Nonspecific T abnormalities, lateral leads Minimal ST elevation, anterior leads when compared to prior, similar appearance. NO STEMI Confirmed by Theda Belfast ([id number]) on 09/20/2022 9:16:05 PM  Radiology No results found.  Procedures Procedures    Medications Ordered in ED Medications  sodium chloride 0.9 % bolus 1,000 mL (0 mLs Intravenous Stopped 09/20/22 2142)    ED Course/ Medical Decision Making/ A&P                             Medical Decision Making Amount and/or Complexity of Data Reviewed Labs: ordered.   This patient is a 47 y.o. male who presents to the ED for concern of syncope, this involves an extensive number of treatment options, and is a complaint that carries with it a high risk of complications and morbidity. The emergent differential diagnosis prior to evaluation includes, but is not limited to,  CVA, ACS, arrhythmia, vasovagal syncope, orthostatic hypotension, sepsis, hypoglycemia, electrolyte disturbance, respiratory failure, symptomatic anemia, dehydration, heat injury, polypharmacy, malignancy, anxiety/panic attack.   This is not an  exhaustive differential.   Past Medical History / Co-morbidities / Social History: history of diabetes, hypertension, OSA, morbid obesity  Additional history: Chart reviewed. Pertinent results include: patient and wife report patient works in a [employer] all day and does not normally drink any fluids  Physical Exam: Physical exam performed. The pertinent findings include: per above, no pertinent physical exam findings  Lab Tests: I ordered, and personally interpreted labs.  The pertinent results include:  WBC 10.7, hgb 12.8 unchanged from previous. Creatinine 1.31 up from 1.17 1 year ago. Likely due to dehydration   Cardiac Monitoring:  The patient was maintained on a cardiac monitor.  My attending physician Dr. Rush Landmark viewed and interpreted the cardiac monitored which showed an underlying rhythm of: No STEMI. I agree with this interpretation.   Medications: I ordered medication including fluids  for dehydration. Reevaluation of the patient after these medicines showed that the patient improved. I have reviewed the patients home medicines and have made adjustments as needed.   Disposition: After consideration of the diagnostic results and the patients response to treatment, I feel that emergency department workup does not suggest an emergent condition requiring admission or immediate intervention beyond what has been performed at this time. The plan is: discharge with close outpatient follow-up.  After fluid bolus, patient states he is feeling improved.  His orthostatic vital signs are normal.  He is able to walk around with any subsequent episodes of syncope or near syncope. Suspect patients symptoms are due to dehydration.  Discussed with patient and family who are understanding and in agreement.  Evaluation and diagnostic testing in the emergency department does not suggest an emergent condition requiring admission or immediate intervention beyond what has been performed at this time.   Plan for discharge with close PCP follow-up.  Patient is understanding and amenable with plan, educated on red flag symptoms that would prompt immediate return.  Patient discharged in stable condition.    Final Clinical Impression(s) / ED Diagnoses Final diagnoses:  Dehydration  Vasovagal syncope    Rx / DC Orders ED Discharge Orders     None     An After Visit Summary was printed and given to the patient.     Vear Clock 09/20/22 2201    Tegeler, Canary Brim, MD 09/20/22 [phone]

## 2023-01-20 ENCOUNTER — Emergency Department (HOSPITAL_BASED_OUTPATIENT_CLINIC_OR_DEPARTMENT_OTHER): Payer: Medicaid Other

## 2023-01-20 ENCOUNTER — Emergency Department (HOSPITAL_BASED_OUTPATIENT_CLINIC_OR_DEPARTMENT_OTHER)
Admission: EM | Admit: 2023-01-20 | Discharge: 2023-01-21 | Disposition: A | Discharge status: Home / Self Care | Payer: Medicaid Other | Attending: Emergency Medicine | Admitting: Emergency Medicine

## 2023-01-20 ENCOUNTER — Other Ambulatory Visit: Payer: Self-pay

## 2023-01-20 DIAGNOSIS — Z87891 Personal history of nicotine dependence: Secondary | ICD-10-CM | POA: Diagnosis not present

## 2023-01-20 DIAGNOSIS — I10 Essential (primary) hypertension: Secondary | ICD-10-CM | POA: Diagnosis not present

## 2023-01-20 DIAGNOSIS — E119 Type 2 diabetes mellitus without complications: Secondary | ICD-10-CM | POA: Insufficient documentation

## 2023-01-20 DIAGNOSIS — Z7984 Long term (current) use of oral hypoglycemic drugs: Secondary | ICD-10-CM | POA: Diagnosis not present

## 2023-01-20 DIAGNOSIS — Z79899 Other long term (current) drug therapy: Secondary | ICD-10-CM | POA: Insufficient documentation

## 2023-01-20 DIAGNOSIS — R4182 Altered mental status, unspecified: Secondary | ICD-10-CM | POA: Insufficient documentation

## 2023-01-20 LAB — I-STAT VENOUS BLOOD GAS, ED
Acid-Base Excess: 1 mmol/L (ref 0.0–2.0)
Bicarbonate: 26.6 mmol/L (ref 20.0–28.0)
Calcium, Ion: 1.13 mmol/L — ABNORMAL LOW (ref 1.15–1.40)
HCT: 33 % — ABNORMAL LOW (ref 39.0–52.0)
Hemoglobin: 11.2 g/dL — ABNORMAL LOW (ref 13.0–17.0)
O2 Saturation: 94 %
Patient temperature: 97.9
Potassium: 3.5 mmol/L (ref 3.5–5.1)
Sodium: 141 mmol/L (ref 135–145)
TCO2: 28 mmol/L (ref 22–32)
pCO2, Ven: 44.5 mm[Hg] (ref 44–60)
pH, Ven: 7.382 (ref 7.25–7.43)
pO2, Ven: 71 mm[Hg] — ABNORMAL HIGH (ref 32–45)

## 2023-01-20 LAB — BASIC METABOLIC PANEL
Anion gap: 8 (ref 5–15)
BUN: 18 mg/dL (ref 6–20)
CO2: 28 mmol/L (ref 22–32)
Calcium: 8.6 mg/dL — ABNORMAL LOW (ref 8.9–10.3)
Chloride: 104 mmol/L (ref 98–111)
Creatinine, Ser: 1.03 mg/dL (ref 0.61–1.24)
GFR, Estimated: >60 mL/min (ref >60)
Glucose, Bld: 98 mg/dL (ref 70–99)
Potassium: 3.7 mmol/L (ref 3.5–5.1)
Sodium: 140 mmol/L (ref 135–145)

## 2023-01-20 LAB — CBC
HCT: 35.8 % — ABNORMAL LOW (ref 39.0–52.0)
Hemoglobin: 11.4 g/dL — ABNORMAL LOW (ref 13.0–17.0)
MCH: 27.2 pg (ref 26.0–34.0)
MCHC: 31.8 g/dL (ref 30.0–36.0)
MCV: 85.4 fL (ref 80.0–100.0)
Platelets: 278 10*3/uL (ref 150–400)
RBC: 4.19 MIL/uL — ABNORMAL LOW (ref 4.22–5.81)
RDW: 14 % (ref 11.5–15.5)
WBC: 10.7 10*3/uL — ABNORMAL HIGH (ref 4.0–10.5)
nRBC: 0 % (ref 0.0–0.2)

## 2023-01-20 LAB — COMPREHENSIVE METABOLIC PANEL
ALT: 18 U/L (ref 0–44)
AST: 13 U/L — ABNORMAL LOW (ref 15–41)
Albumin: 3.4 g/dL — ABNORMAL LOW (ref 3.5–5.0)
Alkaline Phosphatase: 80 U/L (ref 38–126)
Anion gap: 8 (ref 5–15)
BUN: 18 mg/dL (ref 6–20)
CO2: 28 mmol/L (ref 22–32)
Calcium: 8.3 mg/dL — ABNORMAL LOW (ref 8.9–10.3)
Chloride: 105 mmol/L (ref 98–111)
Creatinine, Ser: 1.03 mg/dL (ref 0.61–1.24)
GFR, Estimated: >60 mL/min (ref >60)
Glucose, Bld: 98 mg/dL (ref 70–99)
Potassium: 3.6 mmol/L (ref 3.5–5.1)
Sodium: 141 mmol/L (ref 135–145)
Total Bilirubin: 0.6 mg/dL (ref 0.3–1.2)
Total Protein: 6.3 g/dL — ABNORMAL LOW (ref 6.5–8.1)

## 2023-01-20 LAB — TSH: TSH: 0.373 u[IU]/mL (ref 0.350–4.500)

## 2023-01-20 LAB — DIFFERENTIAL
Abs Immature Granulocytes: 0.03 10*3/uL (ref 0.00–0.07)
Basophils Absolute: 0 10*3/uL (ref 0.0–0.1)
Basophils Relative: 0 %
Eosinophils Absolute: 0.2 10*3/uL (ref 0.0–0.5)
Eosinophils Relative: 1 %
Immature Granulocytes: 0 %
Lymphocytes Relative: 33 %
Lymphs Abs: 3.5 10*3/uL (ref 0.7–4.0)
Monocytes Absolute: 0.9 10*3/uL (ref 0.1–1.0)
Monocytes Relative: 8 %
Neutro Abs: 6.1 10*3/uL (ref 1.7–7.7)
Neutrophils Relative %: 58 %

## 2023-01-20 LAB — URINALYSIS, ROUTINE W REFLEX MICROSCOPIC
Bilirubin Urine: NEGATIVE
Glucose, UA: NEGATIVE mg/dL
Hgb urine dipstick: NEGATIVE
Ketones, ur: NEGATIVE mg/dL
Leukocytes,Ua: NEGATIVE
Nitrite: NEGATIVE
Specific Gravity, Urine: 1.028 (ref 1.005–1.030)
pH: 5.5 (ref 5.0–8.0)

## 2023-01-20 LAB — RAPID URINE DRUG SCREEN, HOSP PERFORMED
Amphetamines: NOT DETECTED
Barbiturates: NOT DETECTED
Benzodiazepines: NOT DETECTED
Cocaine: NOT DETECTED
Opiates: NOT DETECTED
Tetrahydrocannabinol: NOT DETECTED

## 2023-01-20 LAB — CBG MONITORING, ED: Glucose-Capillary: 97 mg/dL (ref 70–99)

## 2023-01-20 LAB — AMMONIA: Ammonia: 38 umol/L — ABNORMAL HIGH (ref 9–35)

## 2023-01-20 LAB — APTT: aPTT: 30 s (ref 24–36)

## 2023-01-20 LAB — ETHANOL: Alcohol, Ethyl (B): <10 mg/dL (ref <10)

## 2023-01-20 LAB — PROTIME-INR
INR: 1 (ref 0.8–1.2)
Prothrombin Time: 13.2 s (ref 11.4–15.2)

## 2023-01-20 MED ORDER — SODIUM CHLORIDE 0.9 % IV BOLUS
1000.0000 mL | Freq: Once | INTRAVENOUS | Status: AC
  Administered 2023-01-20: 1000 mL via INTRAVENOUS

## 2023-01-20 MED ORDER — SODIUM CHLORIDE 0.9 % IV SOLN: INTRAVENOUS | Status: DC

## 2023-01-20 MED ORDER — NALOXONE HCL 0.4 MG/ML IJ SOLN
0.4000 mg | Freq: Once | INTRAMUSCULAR | Status: AC
  Administered 2023-01-20: 0.4 mg via INTRAVENOUS
  Filled 2023-01-20: qty 1

## 2023-01-20 NOTE — ED Triage Notes (Signed)
Pt with slow gait, reported by wife that pt c/o hypertension and slurring words this morning at ~ 1015. States hypotension tonight. Bilaterally equal, speech wnl per wife at this time. Denies HA, c/o bilateral hip pain

## 2023-01-20 NOTE — ED Provider Notes (Addendum)
[place] EMERGENCY DEPARTMENT AT [facility] Provider Note   CSN: [id number] Arrival date & time: 01/20/23  1944     History  Chief Complaint  Patient presents with   Lethargic    Frank Howe is a 47 y.o. male.  Patient brought in by wife.  Last seen normal was on Thursday he works 2 jobs.  So they often do not see each other.  Patient supervisor at work today contacted her and said he did not seem to be acting normal.  Seem to be very sleepy.  Patient's wife thought maybe he was having trouble some getting some words out maybe it was slurred but that is resolved.  Patient came in by POV.  Patient denies headache chest pain shortness of breath abdominal pain fevers nausea vomiting or diarrhea.  Patient's wife states that 1 time this happened before he was dehydrated.  There is no history of seizures.  Past medical history significant for gout obesity hypertension sleep apnea diabetes type 2 hypertension.  Past surgical history significant for umbilical hernia repair in 2019.  Patient is a former smoker quit in 2016.       Home Medications Prior to Admission medications   Medication Sig Start Date End Date Taking? Authorizing Provider  amLODipine (NORVASC) 5 MG tablet Take 5 mg by mouth daily. 02/09/21   [provider]  colchicine 0.6 MG tablet Take 1 tablet (0.6 mg total) by mouth daily as needed (gout flare). Patient taking differently: Take 0.6 mg by mouth daily. 01/17/21 03/01/21  Glade Lloyd, MD  loperamide (IMODIUM A-D) 2 MG tablet Take 2 tablets (4 mg total) by mouth 4 (four) times daily as needed for diarrhea or loose stools. 11/14/21   Theadora Rama Scales, PA-C  losartan-hydrochlorothiazide (HYZAAR) 50-12.5 MG tablet Take 1 tablet by mouth daily. 10/01/21   [provider]  metFORMIN (GLUCOPHAGE-XR) 500 MG 24 hr tablet Take 500 mg by mouth daily. 07/18/21   [provider]  ondansetron (ZOFRAN-ODT) 8 MG disintegrating tablet Take 1  tablet (8 mg total) by mouth every 8 (eight) hours as needed for nausea or vomiting. 11/14/21   Theadora Rama Scales, PA-C      Allergies    Patient has no known allergies.    Review of Systems   Review of Systems  Unable to perform ROS: Mental status change  Constitutional:  Negative for fever.  HENT:  Negative for congestion.   Respiratory:  Negative for shortness of breath.   Cardiovascular:  Negative for chest pain.  Gastrointestinal:  Negative for abdominal pain, diarrhea, nausea and vomiting.  Neurological:  Negative for headaches.    Physical Exam Updated Vital Signs BP 105/61   Pulse 63   Temp 97.9 F (36.6 C) (Oral)   Resp (!) 22   Ht 1.727 m (5\' 8" )   Wt 125.2 kg   SpO2 93%   BMI 41.97 kg/m  Physical Exam Vitals and nursing note reviewed.  Constitutional:      General: He is not in acute distress.    Appearance: Normal appearance. He is well-developed.  HENT:     Head: Normocephalic and atraumatic.     Mouth/Throat:     Mouth: Mucous membranes are dry.  Eyes:     Extraocular Movements: Extraocular movements intact.     Conjunctiva/sclera: Conjunctivae normal.     Pupils: Pupils are equal, round, and reactive to light.  Cardiovascular:     Rate and Rhythm: Normal rate and regular rhythm.  Heart sounds: No murmur heard. Pulmonary:     Effort: Pulmonary effort is normal. No respiratory distress.     Breath sounds: Normal breath sounds.  Abdominal:     Palpations: Abdomen is soft.     Tenderness: There is no abdominal tenderness.  Musculoskeletal:        General: No swelling.     Cervical back: Neck supple. No rigidity.  Skin:    General: Skin is warm and dry.     Capillary Refill: Capillary refill takes less than 2 seconds.  Neurological:     General: No focal deficit present.     Cranial Nerves: No cranial nerve deficit.     Sensory: No sensory deficit.     Motor: No weakness.     Comments: Patient lethargic.  But will follow commands and will  answer some questions.  Denies headache neck stiffness abdominal pain chest pain trouble breathing nausea vomiting or diarrhea  Psychiatric:        Mood and Affect: Mood normal.     ED Results / Procedures / Treatments   Labs (all labs ordered are listed, but only abnormal results are displayed) Labs Reviewed  BASIC METABOLIC PANEL - Abnormal; Notable for the following components:      Result Value   Calcium 8.6 (*)    All other components within normal limits  CBC - Abnormal; Notable for the following components:   WBC 10.7 (*)    RBC 4.19 (*)    Hemoglobin 11.4 (*)    HCT 35.8 (*)    All other components within normal limits  COMPREHENSIVE METABOLIC PANEL - Abnormal; Notable for the following components:   Calcium 8.3 (*)    Total Protein 6.3 (*)    Albumin 3.4 (*)    AST 13 (*)    All other components within normal limits  AMMONIA - Abnormal; Notable for the following components:   Ammonia 38 (*)    All other components within normal limits  I-STAT VENOUS BLOOD GAS, ED - Abnormal; Notable for the following components:   pO2, Ven 71 (*)    Calcium, Ion 1.13 (*)    HCT 33.0 (*)    Hemoglobin 11.2 (*)    All other components within normal limits  ETHANOL  PROTIME-INR  APTT  DIFFERENTIAL  URINALYSIS, ROUTINE W REFLEX MICROSCOPIC  RAPID URINE DRUG SCREEN, HOSP PERFORMED  CBG MONITORING, ED    EKG EKG Interpretation Date/Time:  Saturday January 20 2023 21:31:39 EDT Ventricular Rate:  60 PR Interval:  165 QRS Duration:  94 QT Interval:  435 QTC Calculation: 435 R Axis:   14  Text Interpretation: Sinus rhythm Abnormal R-wave progression, early transition Left ventricular hypertrophy No significant change since last tracing Confirmed by Vanetta Mulders [phone]) on 01/20/2023 9:34:13 PM  Radiology DG Chest Port 1 View  Result Date: 01/20/2023 CLINICAL DATA:  Altered mental status, slurred speech EXAM: PORTABLE CHEST 1 VIEW COMPARISON:  01/14/2021 FINDINGS: Heart  and mediastinal contours are within normal limits. No focal opacities or effusions. No acute bony abnormality. IMPRESSION: No active disease. Electronically Signed   By: Charlett Nose M.D.   On: 01/20/2023 21:05   CT HEAD WO CONTRAST  Result Date: 01/20/2023 CLINICAL DATA:  Neuro deficit, acute, stroke suspected Mental status change, unknown cause EXAM: CT HEAD WITHOUT CONTRAST TECHNIQUE: Contiguous axial images were obtained from the base of the skull through the vertex without intravenous contrast. RADIATION DOSE REDUCTION: This exam was performed according to the  departmental dose-optimization program which includes automated exposure control, adjustment of the mA and/or kV according to patient size and/or use of iterative reconstruction technique. COMPARISON:  None Available. FINDINGS: Brain: No acute intracranial abnormality. Specifically, no hemorrhage, hydrocephalus, mass lesion, acute infarction, or significant intracranial injury. Vascular: No hyperdense vessel or unexpected calcification. Skull: No acute calvarial abnormality. Sinuses/Orbits: No acute findings Other: None IMPRESSION: Normal study. Electronically Signed   By: Charlett Nose M.D.   On: 01/20/2023 21:00    Procedures Procedures    Medications Ordered in ED Medications  0.9 %  sodium chloride infusion ( Intravenous New Bag/Given 01/20/23 2108)  sodium chloride 0.9 % bolus 1,000 mL (1,000 mLs Intravenous New Bag/Given 01/20/23 2107)  naloxone [facility]) injection 0.4 mg (0.4 mg Intravenous Given 01/20/23 2108)    ED Course/ Medical Decision Making/ A&P                                 Medical Decision Making Amount and/or Complexity of Data Reviewed Labs: ordered. Radiology: ordered.  Risk Prescription drug management.    Patient lethargic here.  But will wake up and follow commands.  No obvious focal neurodeficit.  Mucous membranes appear dry.  Ammonia level pending.  Venous blood gas actually reassuring with pH 7.38  pCO2 44 so not retaining carbon dioxide.  Alcohol level less than 10 basic metabolic panel GFR greater than 60 electrolytes normal.  Does not seem to be consistent with significant dehydration.  Patient is receiving IV fluids.  CBC white count up a little bit at 10.7 hemoglobin 11.4 platelets 278.  Patient's CBC differential is normal.  Complete metabolic panel no liver function test abnormalities.  Albumin down a little bit at 3.4.  Anion gap 8.  Initial fingerstick was 97.  Portable chest x-ray no active disease on the chest x-ray.  And CT head was a normal study.  EKG without any acute abnormalities.  Will continue IV fluid hydration.  We are still waiting on urine drug screen and urine.  Patient's temp here was 97.9 pulse 74 blood pressure 118/72 respirations 19 oxygen saturation 96%.  Patient's wife did states she did give him his home medicines today.  Urinalysis and urine drug screen still pending.  Ammonia level slightly elevated 38 but liver function test were completely normal.  Patient still somewhat lethargic.  No change really in mental status.  Will go ahead and give another liter of fluid.  Is starting to show signs of improvement.  He has been able to urinate.  So urinalysis urine drug screen should be coming back soon.  Patient wife states that when this happened before they just kept hydrating him and eventually he got better and was discharged home.  Patient does have a history of some chronic pain in the hips.  Patient had no significant change with Narcan however.  Final Clinical Impression(s) / ED Diagnoses Final diagnoses:  Altered mental status, unspecified altered mental status type    Rx / DC Orders ED Discharge Orders     None         Vanetta Mulders, MD 01/20/23 2139    Vanetta Mulders, MD 01/20/23 1610    Vanetta Mulders, MD 01/20/23 2306

## 2023-01-20 NOTE — ED Notes (Signed)
Pt aware of need for urine sample, states he is unable to provide one at this time.

## 2023-01-20 NOTE — ED Notes (Signed)
ED Provider at bedside. 

## 2023-01-21 NOTE — ED Provider Notes (Signed)
  Physical Exam  BP 112/81   Pulse (!) 50   Temp 97.6 F (36.4 C) (Oral)   Resp 14   Ht 5\' 8"  (1.727 m)   Wt 125.2 kg   SpO2 96%   BMI 41.97 kg/m   Physical Exam Vitals and nursing note reviewed.  Constitutional:      General: He is not in acute distress.    Appearance: He is well-developed. He is not diaphoretic.  HENT:     Head: Normocephalic and atraumatic.  Cardiovascular:     Rate and Rhythm: Normal rate and regular rhythm.     Heart sounds: No murmur heard.    No friction rub.  Pulmonary:     Effort: Pulmonary effort is normal. No respiratory distress.     Breath sounds: Normal breath sounds. No wheezing or rales.  Abdominal:     General: Bowel sounds are normal. There is no distension.     Palpations: Abdomen is soft.     Tenderness: There is no abdominal tenderness.  Musculoskeletal:        General: Normal range of motion.     Cervical back: Normal range of motion and neck supple.  Skin:    General: Skin is warm and dry.  Neurological:     Mental Status: He is alert and oriented to person, place, and time.     Coordination: Coordination normal.     Procedures  Procedures  ED Course / MDM    Medical Decision Making Amount and/or Complexity of Data Reviewed Labs: ordered. Radiology: ordered.  Risk Prescription drug management.   Care assumed from Dr. Deretha Emory at shift change.  Patient presenting here for evaluation of altered mental status as described in the HPI.  Patient has apparently not taken his medicines earlier today, then this evening at work began feeling very lethargic, sluggish, and fatigued.  He came home from work and rested, but symptoms persisted.  This has happened in the past and he has been dehydrated.  Care was signed out to me awaiting IV hydration and reassessment.  Patient has received IV fluids and is now feeling better.  He is requesting to go home.  I have reviewed his laboratory studies and imaging studies and see nothing that  appears emergent.  I feel as though patient can safely be discharged.  I will give a work excuse for 2 days and have patient return as needed.       Geoffery Lyons, MD 01/21/23 0040

## 2023-01-21 NOTE — Discharge Instructions (Signed)
Continue medications as previously prescribed.  Return to the ER if your symptoms significantly worsen or change.

## 2023-05-03 NOTE — Progress Notes (Signed)
 Surgery orders requested via Epic inbox.

## 2023-05-03 NOTE — Patient Instructions (Addendum)
 SURGICAL WAITING ROOM VISITATION Patients having surgery or a procedure may have no more than 2 support people in the waiting area - these visitors may rotate.    Children under the age of 88 must have an adult with them who is not the patient.  Due to an increase in RSV and influenza rates and associated hospitalizations, children ages 71 and under may not visit patients in [facility] hospitals.  If the patient needs to stay at the hospital during part of their recovery, the visitor guidelines for inpatient rooms apply. Pre-op nurse will coordinate an appropriate time for 1 support person to accompany patient in pre-op.  This support person may not rotate.    Please refer to the [facility] website for the visitor guidelines for Inpatients (after your surgery is over and you are in a regular room).       Your procedure is scheduled on: 05-21-23   Report to [facility] Main Entrance    Report to admitting at 9:45 AM   Call this number if you have problems the morning of surgery [phone]   Do not eat food :After Midnight.   After Midnight you may have the following liquids until  9:15 AM DAY OF SURGERY  Water  Non-Citrus Juices (without pulp, NO RED-Apple, White grape, White cranberry) Black Coffee (NO MILK/CREAM OR CREAMERS, sugar ok)  Clear Tea (NO MILK/CREAM OR CREAMERS, sugar ok) regular and decaf                             Plain Jell-O (NO RED)                                           Fruit ices (not with fruit pulp, NO RED)                                     Popsicles (NO RED)                                                               Sports drinks like Gatorade (NO RED)                      If you have questions, please contact your surgeon's office.   FOLLOW  ANY ADDITIONAL PRE OP INSTRUCTIONS YOU RECEIVED FROM YOUR SURGEON'S OFFICE!!!     Oral Hygiene is also important to reduce your risk of infection.                                    Remember -  BRUSH YOUR TEETH THE MORNING OF SURGERY WITH YOUR REGULAR TOOTHPASTE   Do NOT smoke after Midnight   Take these medicines the morning of surgery with A SIP OF WATER :   Amlodipine   Carvedilol   Tramadol  if needed  Colchicine  if needed  Stop all vitamins and herbal supplements 7 days before surgery    WHAT DO I DO ABOUT MY DIABETES MEDICATION?  Do not take oral diabetes medicines (pills) the morning of surgery.  Hold Ozempic 7 days before surgery (do not take after 05-13-23)  DO NOT TAKE THE FOLLOWING 7 DAYS PRIOR TO SURGERY: Ozempic, Wegovy, Rybelsus (Semaglutide), Byetta (exenatide), Bydureon (exenatide ER), Victoza, Saxenda (liraglutide), or Trulicity (dulaglutide) Mounjaro (Tirzepatide) Adlyxin (Lixisenatide), Polyethylene Glycol Loxenatide.   Reviewed and Endorsed by [facility] Patient Education Committee, August 2015  Bring CPAP mask and tubing day of surgery.                              You may not have any metal on your body including jewelry, and body piercing             Do not wear lotions, powders, cologne, or deodorant              Men may shave face and neck.   Do not bring valuables to the hospital. [facility] IS NOT RESPONSIBLE   FOR VALUABLES.   Contacts, dentures or bridgework may not be worn into surgery.  DO NOT BRING YOUR HOME MEDICATIONS TO THE HOSPITAL. PHARMACY WILL DISPENSE MEDICATIONS LISTED ON YOUR MEDICATION LIST TO YOU DURING YOUR ADMISSION IN THE HOSPITAL!    Patients discharged on the day of surgery will not be allowed to drive home.  Someone NEEDS to stay with you for the first 24 hours after anesthesia.               Please read over the following fact sheets you were given: IF YOU HAVE QUESTIONS ABOUT YOUR PRE-OP INSTRUCTIONS PLEASE CALL [phone] Gwen  If you received a COVID test during your pre-op visit  it is requested that you wear a mask when out in public, stay away from anyone that may not be feeling well and notify your surgeon  if you develop symptoms. If you test positive for Covid or have been in contact with anyone that has tested positive in the last 10 days please notify you surgeon.    Pre-operative 5 CHG Bath Instructions   You can play a key role in reducing the risk of infection after surgery. Your skin needs to be as free of germs as possible. You can reduce the number of germs on your skin by washing with CHG (chlorhexidine  gluconate) soap before surgery. CHG is an antiseptic soap that kills germs and continues to kill germs even after washing.   DO NOT use if you have an allergy to chlorhexidine /CHG or antibacterial soaps. If your skin becomes reddened or irritated, stop using the CHG and notify one of our RNs at [phone].   Please shower with the CHG soap starting 4 days before surgery using the following schedule:     Please keep in mind the following:  DO NOT shave, including legs and underarms, starting the day of your first shower.   You may shave your face at any point before/day of surgery.  Place clean sheets on your bed the day you start using CHG soap. Use a clean washcloth (not used since being washed) for each shower. DO NOT sleep with pets once you start using the CHG.   CHG Shower Instructions:  If you choose to wash your hair and private area, wash first with your normal shampoo/soap.  After you use shampoo/soap, rinse your hair and body thoroughly to remove shampoo/soap residue.  Turn the water  OFF and apply about 3 tablespoons (45 ml) of CHG  soap to a CLEAN washcloth.  Apply CHG soap ONLY FROM YOUR NECK DOWN TO YOUR TOES (washing for 3-5 minutes)  DO NOT use CHG soap on face, private areas, open wounds, or sores.  Pay special attention to the area where your surgery is being performed.  If you are having back surgery, having someone wash your back for you may be helpful. Wait 2 minutes after CHG soap is applied, then you may rinse off the CHG soap.  Pat dry with a clean towel   Put on clean clothes/pajamas   If you choose to wear lotion, please use ONLY the CHG-compatible lotions on the back of this paper.     Additional instructions for the day of surgery: DO NOT APPLY any lotions, deodorants, cologne, or perfumes.   Put on clean/comfortable clothes.  Brush your teeth.  Ask your nurse before applying any prescription medications to the skin.      CHG Compatible Lotions   Aveeno Moisturizing lotion  Cetaphil Moisturizing Cream  Cetaphil Moisturizing Lotion  Clairol Herbal Essence Moisturizing Lotion, Dry Skin  Clairol Herbal Essence Moisturizing Lotion, Extra Dry Skin  Clairol Herbal Essence Moisturizing Lotion, Normal Skin  Curel Age Defying Therapeutic Moisturizing Lotion with Alpha Hydroxy  Curel Extreme Care Body Lotion  Curel Soothing Hands Moisturizing Hand Lotion  Curel Therapeutic Moisturizing Cream, Fragrance-Free  Curel Therapeutic Moisturizing Lotion, Fragrance-Free  Curel Therapeutic Moisturizing Lotion, Original Formula  Eucerin Daily Replenishing Lotion  Eucerin Dry Skin Therapy Plus Alpha Hydroxy Crme  Eucerin Dry Skin Therapy Plus Alpha Hydroxy Lotion  Eucerin Original Crme  Eucerin Original Lotion  Eucerin Plus Crme Eucerin Plus Lotion  Eucerin TriLipid Replenishing Lotion  Keri Anti-Bacterial Hand Lotion  Keri Deep Conditioning Original Lotion Dry Skin Formula Softly Scented  Keri Deep Conditioning Original Lotion, Fragrance Free Sensitive Skin Formula  Keri Lotion Fast Absorbing Fragrance Free Sensitive Skin Formula  Keri Lotion Fast Absorbing Softly Scented Dry Skin Formula  Keri Original Lotion  Keri Skin Renewal Lotion Keri Silky Smooth Lotion  Keri Silky Smooth Sensitive Skin Lotion  Nivea Body Creamy Conditioning Oil  Nivea Body Extra Enriched Lotion  Nivea Body Original Lotion  Nivea Body Sheer Moisturizing Lotion Nivea Crme  Nivea Skin Firming Lotion  NutraDerm 30 Skin Lotion  NutraDerm Skin Lotion  NutraDerm  Therapeutic Skin Cream  NutraDerm Therapeutic Skin Lotion  ProShield Protective Hand Cream  Provon moisturizing lotion   PATIENT SIGNATURE_________________________________  NURSE SIGNATURE__________________________________  ________________________________________________________________________    Frank Howe  An incentive spirometer is a tool that can help keep your lungs clear and active. This tool measures how well you are filling your lungs with each breath. Taking long deep breaths may help reverse or decrease the chance of developing breathing (pulmonary) problems (especially infection) following: A long period of time when you are unable to move or be active. BEFORE THE PROCEDURE  If the spirometer includes an indicator to show your best effort, your nurse or respiratory therapist will set it to a desired goal. If possible, sit up straight or lean slightly forward. Try not to slouch. Hold the incentive spirometer in an upright position. INSTRUCTIONS FOR USE  Sit on the edge of your bed if possible, or sit up as far as you can in bed or on a chair. Hold the incentive spirometer in an upright position. Breathe out normally. Place the mouthpiece in your mouth and seal your lips tightly around it. Breathe in slowly and as deeply as possible, raising the piston or  the ball toward the top of the column. Hold your breath for 3-5 seconds or for as long as possible. Allow the piston or ball to fall to the bottom of the column. Remove the mouthpiece from your mouth and breathe out normally. Rest for a few seconds and repeat Steps 1 through 7 at least 10 times every 1-2 hours when you are awake. Take your time and take a few normal breaths between deep breaths. The spirometer may include an indicator to show your best effort. Use the indicator as a goal to work toward during each repetition. After each set of 10 deep breaths, practice coughing to be sure your lungs are clear. If  you have an incision (the cut made at the time of surgery), support your incision when coughing by placing a pillow or rolled up towels firmly against it. Once you are able to get out of bed, walk around indoors and cough well. You may stop using the incentive spirometer when instructed by your caregiver.  RISKS AND COMPLICATIONS Take your time so you do not get dizzy or light-headed. If you are in pain, you may need to take or ask for pain medication before doing incentive spirometry. It is harder to take a deep breath if you are having pain. AFTER USE Rest and breathe slowly and easily. It can be helpful to keep track of a log of your progress. Your caregiver can provide you with a simple table to help with this. If you are using the spirometer at home, follow these instructions: SEEK MEDICAL CARE IF:  You are having difficultly using the spirometer. You have trouble using the spirometer as often as instructed. Your pain medication is not giving enough relief while using the spirometer. You develop fever of 100.5 F (38.1 C) or higher. SEEK IMMEDIATE MEDICAL CARE IF:  You cough up bloody sputum that had not been present before. You develop fever of 102 F (38.9 C) or greater. You develop worsening pain at or near the incision site. MAKE SURE YOU:  Understand these instructions. Will watch your condition. Will get help right away if you are not doing well or get worse. Document Released: 08/21/2006 Document Revised: 07/03/2011 Document Reviewed: 10/22/2006 Exit[employer] Patient Information 2014 [employer], [place].   ________________________________________________________________________ WHAT IS A BLOOD TRANSFUSION? Blood Transfusion Information  A transfusion is the replacement of blood or some of its parts. Blood is made up of multiple cells which provide different functions. Red blood cells carry oxygen and are used for blood loss replacement. White blood cells fight against  infection. Platelets control bleeding. Plasma helps clot blood. Other blood products are available for specialized needs, such as hemophilia or other clotting disorders. BEFORE THE TRANSFUSION  Who gives blood for transfusions?  Healthy volunteers who are fully evaluated to make sure their blood is safe. This is blood bank blood. Transfusion therapy is the safest it has ever been in the practice of medicine. Before blood is taken from a donor, a complete history is taken to make sure that person has no history of diseases nor engages in risky social behavior (examples are intravenous drug use or sexual activity with multiple partners). The donor's travel history is screened to minimize risk of transmitting infections, such as malaria. The donated blood is tested for signs of infectious diseases, such as HIV and hepatitis. The blood is then tested to be sure it is compatible with you in order to minimize the chance of a transfusion reaction. If you or a  relative donates blood, this is often done in anticipation of surgery and is not appropriate for emergency situations. It takes many days to process the donated blood. RISKS AND COMPLICATIONS Although transfusion therapy is very safe and saves many lives, the main dangers of transfusion include:  Getting an infectious disease. Developing a transfusion reaction. This is an allergic reaction to something in the blood you were given. Every precaution is taken to prevent this. The decision to have a blood transfusion has been considered carefully by your caregiver before blood is given. Blood is not given unless the benefits outweigh the risks. AFTER THE TRANSFUSION Right after receiving a blood transfusion, you will usually feel much better and more energetic. This is especially true if your red blood cells have gotten low (anemic). The transfusion raises the level of the red blood cells which carry oxygen, and this usually causes an energy increase. The  nurse administering the transfusion will monitor you carefully for complications. HOME CARE INSTRUCTIONS  No special instructions are needed after a transfusion. You may find your energy is better. Speak with your caregiver about any limitations on activity for underlying diseases you may have. SEEK MEDICAL CARE IF:  Your condition is not improving after your transfusion. You develop redness or irritation at the intravenous (IV) site. SEEK IMMEDIATE MEDICAL CARE IF:  Any of the following symptoms occur over the next 12 hours: Shaking chills. You have a temperature by mouth above 102 F (38.9 C), not controlled by medicine. Chest, back, or muscle pain. People around you feel you are not acting correctly or are confused. Shortness of breath or difficulty breathing. Dizziness and fainting. You get a rash or develop hives. You have a decrease in urine output. Your urine turns a dark color or changes to pink, red, or brown. Any of the following symptoms occur over the next 10 days: You have a temperature by mouth above 102 F (38.9 C), not controlled by medicine. Shortness of breath. Weakness after normal activity. The white part of the eye turns yellow (jaundice). You have a decrease in the amount of urine or are urinating less often. Your urine turns a dark color or changes to pink, red, or brown. Document Released: 04/07/2000 Document Revised: 07/03/2011 Document Reviewed: 11/25/2007 [facility] Patient Information 2014 [place], [place].  _______________________________________________________________________

## 2023-05-03 NOTE — Progress Notes (Addendum)
 COVID Vaccine Completed:  Date of COVID positive in last 90 days:  No  PCP - Darice Henle, MD Cardiologist - N/A  Chest x-ray - 01-20-23 Epic EKG - 01-20-23 Epic Stress Test - N/A ECHO - N/A Cardiac Cath - N/A Pacemaker/ICD device last checked: Spinal Cord Stimulator:N/A  Bowel Prep - N/A  Sleep Study - Yes, +sleep apnea CPAP - Yes  Fasting Blood Sugar -  Checks Blood Sugar  - does not check   Ozempic Last dose of GLP1 agonist-  05-10-23 GLP1 instructions: Patient aware to hold until after surgery    Last dose of SGLT-2 inhibitors-  N/A SGLT-2 instructions:  Hold 3 days before surgery   Blood Thinner Instructions: N/A Aspirin  Instructions: Last Dose:  Activity level:  Can go up a flight of stairs and perform activities of daily living without stopping and without symptoms of chest pain or shortness of breath.  Anesthesia review:  Poor R wave progression on EKG from 01-20-23  Patient denies shortness of breath, fever, cough and chest pain at PAT appointment  Patient verbalized understanding of instructions that were given to them at the PAT appointment. Patient was also instructed that they will need to review over the PAT instructions again at home before surgery.

## 2023-05-08 ENCOUNTER — Encounter (HOSPITAL_COMMUNITY): Payer: Self-pay

## 2023-05-08 ENCOUNTER — Other Ambulatory Visit: Payer: Self-pay

## 2023-05-08 ENCOUNTER — Other Ambulatory Visit: Payer: Self-pay | Admitting: Orthopedic Surgery

## 2023-05-08 ENCOUNTER — Encounter (HOSPITAL_COMMUNITY)
Admission: RE | Admit: 2023-05-08 | Discharge: 2023-05-08 | Disposition: A | Discharge status: Home / Self Care | Payer: Medicaid Other | Source: Ambulatory Visit | Attending: Orthopedic Surgery | Admitting: Orthopedic Surgery

## 2023-05-08 VITALS — BP 143/78 | HR 66 | Temp 98.5°F | Resp 20 | Ht 68.0 in | Wt 261.0 lb

## 2023-05-08 DIAGNOSIS — Z01812 Encounter for preprocedural laboratory examination: Secondary | ICD-10-CM | POA: Insufficient documentation

## 2023-05-08 DIAGNOSIS — E119 Type 2 diabetes mellitus without complications: Secondary | ICD-10-CM | POA: Insufficient documentation

## 2023-05-08 DIAGNOSIS — I10 Essential (primary) hypertension: Secondary | ICD-10-CM

## 2023-05-08 DIAGNOSIS — Z01818 Encounter for other preprocedural examination: Secondary | ICD-10-CM

## 2023-05-08 HISTORY — DX: Anxiety disorder, unspecified: F41.9

## 2023-05-08 HISTORY — DX: Unspecified osteoarthritis, unspecified site: M19.90

## 2023-05-08 HISTORY — DX: Depression, unspecified: F32.A

## 2023-05-08 HISTORY — DX: Other complications of anesthesia, initial encounter: T88.59XA

## 2023-05-08 LAB — BASIC METABOLIC PANEL
Anion gap: 4 — ABNORMAL LOW (ref 5–15)
BUN: 17 mg/dL (ref 6–20)
CO2: 25 mmol/L (ref 22–32)
Calcium: 8.9 mg/dL (ref 8.9–10.3)
Chloride: 106 mmol/L (ref 98–111)
Creatinine, Ser: 0.83 mg/dL (ref 0.61–1.24)
GFR, Estimated: >60 mL/min (ref >60)
Glucose, Bld: 93 mg/dL (ref 70–99)
Potassium: 3.9 mmol/L (ref 3.5–5.1)
Sodium: 135 mmol/L (ref 135–145)

## 2023-05-08 LAB — SURGICAL PCR SCREEN
MRSA, PCR: NEGATIVE
Staphylococcus aureus: NEGATIVE

## 2023-05-08 LAB — HEMOGLOBIN A1C
Hgb A1c MFr Bld: 5.6 % (ref 4.8–5.6)
Mean Plasma Glucose: 114.02 mg/dL

## 2023-05-08 LAB — CBC
HCT: 41.2 % (ref 39.0–52.0)
Hemoglobin: 12.7 g/dL — ABNORMAL LOW (ref 13.0–17.0)
MCH: 27.1 pg (ref 26.0–34.0)
MCHC: 30.8 g/dL (ref 30.0–36.0)
MCV: 87.8 fL (ref 80.0–100.0)
Platelets: 279 10*3/uL (ref 150–400)
RBC: 4.69 MIL/uL (ref 4.22–5.81)
RDW: 13.9 % (ref 11.5–15.5)
WBC: 10.2 10*3/uL (ref 4.0–10.5)
nRBC: 0 % (ref 0.0–0.2)

## 2023-05-08 LAB — GLUCOSE, CAPILLARY: Glucose-Capillary: 91 mg/dL (ref 70–99)

## 2023-05-08 LAB — TYPE AND SCREEN
ABO/RH(D): O POS
Antibody Screen: NEGATIVE

## 2023-05-17 DIAGNOSIS — M1612 Unilateral primary osteoarthritis, left hip: Secondary | ICD-10-CM | POA: Diagnosis present

## 2023-05-17 NOTE — H&P (Signed)
TOTAL HIP ADMISSION H&P  Patient is admitted for left total hip arthroplasty.  Subjective:  Chief Complaint: left hip pain  HPI: Frank Howe, 48 y.o. male, has a history of pain and functional disability in the left hip(s) due to arthritis and patient has failed non-surgical conservative treatments for greater than 12 weeks to include NSAID's and/or analgesics, corticosteriod injections, flexibility and strengthening excercises, use of assistive devices, weight reduction as appropriate, and activity modification.  Onset of symptoms was gradual starting  several  years ago with gradually worsening course since that time.The patient noted no past surgery on the left hip(s).  Patient currently rates pain in the left hip at 10 out of 10 with activity. Patient has night pain, worsening of pain with activity and weight bearing, pain that interfers with activities of daily living, and pain with passive range of motion. Patient has evidence of periarticular osteophytes and joint space narrowing by imaging studies. This condition presents safety issues increasing the risk of falls.   There is no current active infection.  Patient Active Problem List   Diagnosis Date Noted   Arthritis of left hip 05/17/2023   Recurrent incisional hernia 03/01/2021   SBO (small bowel obstruction) (HCC) 01/15/2021   Essential hypertension 01/15/2021   Type 2 diabetes mellitus with obesity (HCC) 01/15/2021   Obesity, Class III, BMI 40-49.9 (morbid obesity) (HCC) 01/15/2021   Umbilical hernia with obstruction but no gangrene 02/11/2018   Partial small bowel obstruction (HCC) 09/13/2017   Gout, chronic 07/19/2015   OSA (obstructive sleep apnea) 07/19/2015   Obesity 06/29/2015   Elevated uric acid in blood 06/29/2015   Impaired fasting blood sugar 06/28/2015   Knee swelling 06/28/2015   Right knee pain 06/28/2015   Elevated blood pressure reading without diagnosis of hypertension 06/28/2015   Routine general medical  examination at a health care facility 08/21/2011   Past Medical History:  Diagnosis Date   Anxiety    Arthritis    Complication of anesthesia    Fingers numb after surgery   Depression    Diabetes mellitus type 2 in obese 01/15/2021   Elevated blood pressure reading without diagnosis of hypertension    Gout    History of kidney stones    Hypertension    Impaired fasting blood sugar    Obesity    OSA (obstructive sleep apnea)     Past Surgical History:  Procedure Laterality Date   UMBILICAL HERNIA REPAIR N/A 09/14/2017   Procedure: LAPAROSCOPIC UMBILICAL HERNIA;  Surgeon: Romie Levee, MD;  Location: [facility];  Service: General;  Laterality: N/A;   UMBILICAL HERNIA REPAIR N/A 02/11/2018   Procedure: LAPARASCOPIC REDO REPAIR OF RECURRENT UMBILICAL HERNIA WITH INSERTION OF MESH, LYSIS OF ADHESIONS;  Surgeon: Andria Meuse, MD;  Location: [facility];  Service: General;  Laterality: N/A;   XI ROBOTIC ASSISTED VENTRAL HERNIA N/A 03/01/2021   Procedure: ROBOTIC RECURRENT INCISIONAL HERNIA 9CMx9CM REPAIR WITH MESH AND REMOVAL OF INTRAPERITONEAL MESH;  Surgeon: Quentin Ore, MD;  Location: [facility];  Service: General;  Laterality: N/A;    No current facility-administered medications for this encounter.   Current Outpatient Medications  Medication Sig Dispense Refill Last Dose/Taking   amLODipine (NORVASC) 10 MG tablet Take 10 mg by mouth daily.   Taking   carvedilol (COREG) 3.125 MG tablet Take 3.125 mg by mouth 2 (two) times daily.   Taking   colchicine 0.6 MG tablet Take 1 tablet (0.6 mg total) by mouth daily as needed (gout flare).  30 tablet 0 Taking As Needed   losartan-hydrochlorothiazide (HYZAAR) 100-12.5 MG tablet Take 1 tablet by mouth daily.   Taking   meloxicam (MOBIC) 7.5 MG tablet Take 7.5 mg by mouth 2 (two) times daily.   Taking   Menthol, Topical Analgesic, (BENGAY EX) Apply 1 Application topically daily as needed (pain).   Taking As Needed   metFORMIN  (GLUCOPHAGE-XR) 500 MG 24 hr tablet Take 500 mg by mouth daily.   Taking   NON FORMULARY Pt uses a c-pap nightly   Taking   OZEMPIC, 2 MG/DOSE, 8 MG/3ML SOPN Inject 2 mg into the skin every Thursday.   Taking   rosuvastatin (CRESTOR) 10 MG tablet Take 10 mg by mouth at bedtime.   Taking   traMADol (ULTRAM) 50 MG tablet Take 50 mg by mouth 2 (two) times daily.   Taking   Vitamin D, Ergocalciferol, (DRISDOL) 1.25 MG (50000 UNIT) CAPS capsule Take 50,000 Units by mouth 2 (two) times a week.   Taking   loperamide (IMODIUM A-D) 2 MG tablet Take 2 tablets (4 mg total) by mouth 4 (four) times daily as needed for diarrhea or loose stools. (Patient not taking: Reported on 05/04/2023) 30 tablet 0 Not Taking   ondansetron (ZOFRAN-ODT) 8 MG disintegrating tablet Take 1 tablet (8 mg total) by mouth every 8 (eight) hours as needed for nausea or vomiting. (Patient not taking: Reported on 05/04/2023) 20 tablet 0 Not Taking   No Known Allergies  Social History   Tobacco Use   Smoking status: Former    Current packs/day: 0.00    Average packs/day: 0.3 packs/day for 4.0 years (1.0 ttl pk-yrs)    Types: Cigarettes    Start date: 12/29/2010    Quit date: 12/29/2014    Years since quitting: 8.3   Smokeless tobacco: Never   Tobacco comments:    once every 2 wk  Substance Use Topics   Alcohol use: Yes    Comment: occ    Family History  Problem Relation Age of Onset   Arthritis Mother    Hypertension Mother    Stroke Mother    Aneurysm Mother        died of brain aneurysm   Asthma Father    Cancer Father        ?   Arthritis Father    Heart disease Maternal Uncle    Diabetes Maternal Uncle    Alcohol abuse Neg Hx    Hyperlipidemia Neg Hx    Hearing loss Neg Hx    Kidney disease Neg Hx    Early death Neg Hx      Review of Systems  Constitutional: Negative.   HENT: Negative.    Eyes: Negative.        Poor vision  Respiratory:  Positive for shortness of breath.   Cardiovascular:  Positive for  palpitations and leg swelling.       HTN  Endocrine: Negative.   Genitourinary:  Positive for frequency.  Musculoskeletal:  Positive for arthralgias and myalgias.  Skin: Negative.   Allergic/Immunologic: Negative.   Neurological: Negative.   Hematological: Negative.   Psychiatric/Behavioral:  The patient is nervous/anxious.     Objective:  Physical Exam Constitutional:      Appearance: Normal appearance. He is normal weight.  HENT:     Head: Normocephalic and atraumatic.     Nose: Nose normal.  Eyes:     Pupils: Pupils are equal, round, and reactive to light.  Cardiovascular:  Pulses: Normal pulses.  Pulmonary:     Effort: Pulmonary effort is normal.  Musculoskeletal:     Cervical back: Normal range of motion and neck supple.     Comments: the patient has significant pain of any attempts of internal rotation with bilateral hips.  Calves are soft and nontender.    Skin:    General: Skin is warm and dry.  Neurological:     General: No focal deficit present.     Mental Status: He is alert and oriented to person, place, and time. Mental status is at baseline.  Psychiatric:        Mood and Affect: Mood normal.        Behavior: Behavior normal.        Thought Content: Thought content normal.        Judgment: Judgment normal.     Vital signs in last 24 hours:    Labs:   Estimated body mass index is 39.68 kg/m as calculated from the following:   Height as of 05/08/23: 5\' 8"  (1.727 m).   Weight as of 05/08/23: 118.4 kg.   Imaging Review Plain radiographs demonstrate AP of the pelvis and crosstable lateral of the right and left hips are taken and reviewed in office today.  This shows end-stage bone-on-bone arthritis bilateral hips with subchondral cyst formation and subchondral sclerosis as well as flattening of the femoral heads bilaterally.   Assessment/Plan:  End stage arthritis, left hip(s)  The patient history, physical examination, clinical judgement of the  provider and imaging studies are consistent with end stage degenerative joint disease of the left hip(s) and total hip arthroplasty is deemed medically necessary. The treatment options including medical management, injection therapy, arthroscopy and arthroplasty were discussed at length. The risks and benefits of total hip arthroplasty were presented and reviewed. The risks due to aseptic loosening, infection, stiffness, dislocation/subluxation,  thromboembolic complications and other imponderables were discussed.  The patient acknowledged the explanation, agreed to proceed with the plan and consent was signed. Patient is being admitted for inpatient treatment for surgery, pain control, PT, OT, prophylactic antibiotics, VTE prophylaxis, progressive ambulation and ADL's and discharge planning.The patient is planning to be discharged home with home health services    Patient's anticipated LOS is less than 2 midnights, meeting these requirements: - Younger than 91 - Lives within 1 hour of care - Has a competent adult at home to recover with post-op recover - NO history of  - Chronic pain requiring opiods  - Diabetes  - Coronary Artery Disease  - Heart failure  - Heart attack  - Stroke  - DVT/VTE  - Cardiac arrhythmia  - Respiratory Failure/COPD  - Renal failure  - Anemia  - Advanced Liver disease

## 2023-05-18 NOTE — Progress Notes (Signed)
Left VM for patient to return call about time change

## 2023-05-19 NOTE — Anesthesia Preprocedure Evaluation (Signed)
Anesthesia Evaluation  Patient identified by MRN, date of birth, ID band Patient awake    Reviewed: Allergy & Precautions, NPO status , Patient's Chart, lab work & pertinent test results  History of Anesthesia Complications (+) history of anesthetic complications (fingers numb)  Airway Mallampati: III  TM Distance: >3 FB Neck ROM: Full   Comment: Previous grade II view with MAC 4, two-handed mask Dental  (+) Dental Advisory Given,    Pulmonary neg shortness of breath, sleep apnea and Continuous Positive Airway Pressure Ventilation , neg COPD, neg recent URI, former smoker   Pulmonary exam normal breath sounds clear to auscultation       Cardiovascular hypertension (amlodipine, carvedilol, losartan-HCTZ), Pt. on medications and Pt. on home beta blockers (-) angina (-) Past MI, (-) Cardiac Stents and (-) CABG (-) dysrhythmias  Rhythm:Regular Rate:Normal  HLD   Neuro/Psych  PSYCHIATRIC DISORDERS Anxiety Depression    negative neurological ROS     GI/Hepatic negative GI ROS, Neg liver ROS,,,  Endo/Other  diabetes, Type 2, Oral Hypoglycemic Agents    Renal/GU negative Renal ROS     Musculoskeletal  (+) Arthritis , Osteoarthritis,    Abdominal  (+) + obese  Peds  Hematology negative hematology ROS (+) Lab Results      Component                Value               Date                      WBC                      10.2                05/08/2023                HGB                      12.7 (L)            05/08/2023                HCT                      41.2                05/08/2023                MCV                      87.8                05/08/2023                PLT                      279                 05/08/2023              Anesthesia Other Findings Last Ozempic: 2 weeks ago  Reproductive/Obstetrics                             Anesthesia Physical Anesthesia Plan  ASA:  3  Anesthesia Plan: MAC and Spinal   Post-op Pain Management: Tylenol PO (pre-op)*  Induction: Intravenous  PONV Risk Score and Plan: 1 and Ondansetron, Dexamethasone, Treatment may vary due to age or medical condition and Propofol infusion  Airway Management Planned: Natural Airway and Simple Face Mask  Additional Equipment:   Intra-op Plan:   Post-operative Plan:   Informed Consent: I have reviewed the patients History and Physical, chart, labs and discussed the procedure including the risks, benefits and alternatives for the proposed anesthesia with the patient or authorized representative who has indicated his/her understanding and acceptance.     Dental advisory given  Plan Discussed with: CRNA and Anesthesiologist  Anesthesia Plan Comments: (I have discussed risks of neuraxial anesthesia including but not limited to infection, bleeding, nerve injury, back pain, headache, seizures, and failure of block. Patient denies bleeding disorders and is not currently anticoagulated. Labs have been reviewed. Risks and benefits discussed. All patient's questions answered.   Discussed with patient risks of MAC including, but not limited to, minor pain or discomfort, hearing people in the room, and possible need for backup general anesthesia. Risks for general anesthesia also discussed including, but not limited to, sore throat, hoarse voice, chipped/damaged teeth, injury to vocal cords, nausea and vomiting, allergic reactions, lung infection, heart attack, stroke, and death. All questions answered. )        Anesthesia Quick Evaluation

## 2023-05-21 ENCOUNTER — Ambulatory Visit (HOSPITAL_COMMUNITY): Payer: Medicaid Other

## 2023-05-21 ENCOUNTER — Ambulatory Visit (HOSPITAL_COMMUNITY)
Admission: RE | Admit: 2023-05-21 | Discharge: 2023-05-21 | Disposition: A | Discharge status: Home / Self Care | Payer: Medicaid Other | Source: Ambulatory Visit | Attending: Orthopedic Surgery | Admitting: Orthopedic Surgery

## 2023-05-21 ENCOUNTER — Other Ambulatory Visit: Payer: Self-pay

## 2023-05-21 ENCOUNTER — Encounter (HOSPITAL_COMMUNITY): Payer: Self-pay | Admitting: Orthopedic Surgery

## 2023-05-21 ENCOUNTER — Encounter (HOSPITAL_COMMUNITY)
Admission: RE | Disposition: A | Discharge status: Home / Self Care | Payer: Self-pay | Source: Ambulatory Visit | Attending: Orthopedic Surgery

## 2023-05-21 ENCOUNTER — Ambulatory Visit (HOSPITAL_BASED_OUTPATIENT_CLINIC_OR_DEPARTMENT_OTHER): Payer: Self-pay | Admitting: Registered Nurse

## 2023-05-21 ENCOUNTER — Ambulatory Visit (HOSPITAL_COMMUNITY): Payer: Medicaid Other | Admitting: Physician Assistant

## 2023-05-21 DIAGNOSIS — E119 Type 2 diabetes mellitus without complications: Secondary | ICD-10-CM | POA: Diagnosis not present

## 2023-05-21 DIAGNOSIS — F418 Other specified anxiety disorders: Secondary | ICD-10-CM | POA: Diagnosis not present

## 2023-05-21 DIAGNOSIS — Z7984 Long term (current) use of oral hypoglycemic drugs: Secondary | ICD-10-CM | POA: Insufficient documentation

## 2023-05-21 DIAGNOSIS — I10 Essential (primary) hypertension: Secondary | ICD-10-CM | POA: Insufficient documentation

## 2023-05-21 DIAGNOSIS — M25752 Osteophyte, left hip: Secondary | ICD-10-CM | POA: Diagnosis not present

## 2023-05-21 DIAGNOSIS — Z79899 Other long term (current) drug therapy: Secondary | ICD-10-CM | POA: Insufficient documentation

## 2023-05-21 DIAGNOSIS — G4733 Obstructive sleep apnea (adult) (pediatric): Secondary | ICD-10-CM | POA: Diagnosis not present

## 2023-05-21 DIAGNOSIS — Z87891 Personal history of nicotine dependence: Secondary | ICD-10-CM | POA: Diagnosis not present

## 2023-05-21 DIAGNOSIS — M1612 Unilateral primary osteoarthritis, left hip: Secondary | ICD-10-CM | POA: Diagnosis present

## 2023-05-21 DIAGNOSIS — Z7985 Long-term (current) use of injectable non-insulin antidiabetic drugs: Secondary | ICD-10-CM | POA: Insufficient documentation

## 2023-05-21 HISTORY — PX: TOTAL HIP ARTHROPLASTY: SHX124

## 2023-05-21 LAB — GLUCOSE, CAPILLARY
Glucose-Capillary: 85 mg/dL (ref 70–99)
Glucose-Capillary: 97 mg/dL (ref 70–99)

## 2023-05-21 LAB — ABO/RH: ABO/RH(D): O POS

## 2023-05-21 SURGERY — ARTHROPLASTY, HIP, TOTAL, ANTERIOR APPROACH
Anesthesia: Monitor Anesthesia Care | Site: Hip | Laterality: Left

## 2023-05-21 MED ORDER — SODIUM CHLORIDE (PF) 0.9 % IJ SOLN
INTRAMUSCULAR | Status: AC
  Filled 2023-05-21: qty 50

## 2023-05-21 MED ORDER — FENTANYL CITRATE (PF) 100 MCG/2ML IJ SOLN
INTRAMUSCULAR | Status: DC | PRN
  Administered 2023-05-21: 25 ug via INTRAVENOUS
  Administered 2023-05-21: 50 ug via INTRAVENOUS
  Administered 2023-05-21: 25 ug via INTRAVENOUS

## 2023-05-21 MED ORDER — CHLORHEXIDINE GLUCONATE 0.12 % MT SOLN
15.0000 mL | Freq: Once | OROMUCOSAL | Status: AC
  Administered 2023-05-21: 15 mL via OROMUCOSAL

## 2023-05-21 MED ORDER — PROPOFOL 1000 MG/100ML IV EMUL
INTRAVENOUS | Status: AC
  Filled 2023-05-21: qty 100

## 2023-05-21 MED ORDER — METHOCARBAMOL 500 MG PO TABS
ORAL_TABLET | ORAL | Status: AC
  Filled 2023-05-21: qty 1

## 2023-05-21 MED ORDER — OXYCODONE HCL 5 MG PO TABS
5.0000 mg | ORAL_TABLET | ORAL | Status: DC | PRN
  Administered 2023-05-21: 5 mg via ORAL

## 2023-05-21 MED ORDER — TRAMADOL HCL 50 MG PO TABS
50.0000 mg | ORAL_TABLET | Freq: Four times a day (QID) | ORAL | Status: DC
Start: 2023-05-21 — End: 2023-05-22

## 2023-05-21 MED ORDER — TRANEXAMIC ACID-NACL 1000-0.7 MG/100ML-% IV SOLN
INTRAVENOUS | Status: AC
  Administered 2023-05-21: 1000 mg via INTRAVENOUS
  Filled 2023-05-21: qty 100

## 2023-05-21 MED ORDER — TRANEXAMIC ACID 1000 MG/10ML IV SOLN
2000.0000 mg | INTRAVENOUS | Status: DC
  Filled 2023-05-21 (×2): qty 20

## 2023-05-21 MED ORDER — ONDANSETRON HCL 4 MG/2ML IJ SOLN
INTRAMUSCULAR | Status: AC
  Filled 2023-05-21: qty 2

## 2023-05-21 MED ORDER — OXYCODONE HCL 5 MG PO TABS: 10.0000 mg | ORAL_TABLET | ORAL | Status: DC | PRN

## 2023-05-21 MED ORDER — MIDAZOLAM HCL 2 MG/2ML IJ SOLN
INTRAMUSCULAR | Status: AC
Start: 2023-05-21 — End: ?
  Filled 2023-05-21: qty 2

## 2023-05-21 MED ORDER — CEFAZOLIN SODIUM-DEXTROSE 2-4 GM/100ML-% IV SOLN
2.0000 g | INTRAVENOUS | Status: AC
  Administered 2023-05-21: 2 g via INTRAVENOUS
  Filled 2023-05-21: qty 100

## 2023-05-21 MED ORDER — OXYCODONE HCL 5 MG/5ML PO SOLN: 5.0000 mg | Freq: Once | ORAL | Status: DC | PRN

## 2023-05-21 MED ORDER — ORAL CARE MOUTH RINSE: 15.0000 mL | Freq: Once | OROMUCOSAL | Status: AC

## 2023-05-21 MED ORDER — HYDROMORPHONE HCL 1 MG/ML IJ SOLN
0.2500 mg | INTRAMUSCULAR | Status: DC | PRN
  Administered 2023-05-21 (×2): 0.5 mg via INTRAVENOUS

## 2023-05-21 MED ORDER — BUPIVACAINE-EPINEPHRINE 0.25% -1:200000 IJ SOLN
INTRAMUSCULAR | Status: AC
  Filled 2023-05-21: qty 1

## 2023-05-21 MED ORDER — BUPIVACAINE IN DEXTROSE 0.75-8.25 % IT SOLN
INTRATHECAL | Status: DC | PRN
  Administered 2023-05-21: 1.8 mL via INTRATHECAL

## 2023-05-21 MED ORDER — TIZANIDINE HCL 2 MG PO TABS: 2.0000 mg | ORAL_TABLET | Freq: Four times a day (QID) | ORAL | 0 refills | Status: DC | PRN

## 2023-05-21 MED ORDER — TRANEXAMIC ACID-NACL 1000-0.7 MG/100ML-% IV SOLN: 1000.0000 mg | Freq: Once | INTRAVENOUS | Status: AC

## 2023-05-21 MED ORDER — OXYCODONE HCL 5 MG PO TABS
ORAL_TABLET | ORAL | Status: AC
  Filled 2023-05-21: qty 1

## 2023-05-21 MED ORDER — OXYCODONE-ACETAMINOPHEN 5-325 MG PO TABS: 1.0000 | ORAL_TABLET | ORAL | 0 refills | Status: DC | PRN

## 2023-05-21 MED ORDER — INSULIN ASPART 100 UNIT/ML IJ SOLN: 0.0000 [IU] | INTRAMUSCULAR | Status: DC | PRN

## 2023-05-21 MED ORDER — METHOCARBAMOL 500 MG PO TABS
500.0000 mg | ORAL_TABLET | Freq: Four times a day (QID) | ORAL | Status: DC | PRN
  Administered 2023-05-21: 500 mg via ORAL

## 2023-05-21 MED ORDER — BUPIVACAINE-EPINEPHRINE 0.25% -1:200000 IJ SOLN
INTRAMUSCULAR | Status: DC | PRN
  Administered 2023-05-21: 30 mL

## 2023-05-21 MED ORDER — BUPIVACAINE LIPOSOME 1.3 % IJ SUSP
INTRAMUSCULAR | Status: DC | PRN
  Administered 2023-05-21: 10 mL

## 2023-05-21 MED ORDER — TRANEXAMIC ACID 1000 MG/10ML IV SOLN
INTRAVENOUS | Status: DC | PRN
  Administered 2023-05-21: 2000 mg via TOPICAL

## 2023-05-21 MED ORDER — PROPOFOL 500 MG/50ML IV EMUL
INTRAVENOUS | Status: DC | PRN
  Administered 2023-05-21: 85 ug/kg/min via INTRAVENOUS

## 2023-05-21 MED ORDER — SODIUM CHLORIDE 0.9% FLUSH
INTRAVENOUS | Status: DC | PRN
  Administered 2023-05-21: 50 mL

## 2023-05-21 MED ORDER — HYDROMORPHONE HCL 1 MG/ML IJ SOLN
INTRAMUSCULAR | Status: AC
  Filled 2023-05-21: qty 1

## 2023-05-21 MED ORDER — MIDAZOLAM HCL 5 MG/5ML IJ SOLN
INTRAMUSCULAR | Status: DC | PRN
  Administered 2023-05-21: 2 mg via INTRAVENOUS

## 2023-05-21 MED ORDER — DEXAMETHASONE SODIUM PHOSPHATE 10 MG/ML IJ SOLN
INTRAMUSCULAR | Status: AC
  Filled 2023-05-21: qty 1

## 2023-05-21 MED ORDER — DEXAMETHASONE SODIUM PHOSPHATE 10 MG/ML IJ SOLN
INTRAMUSCULAR | Status: DC | PRN
  Administered 2023-05-21: 5 mg via INTRAVENOUS

## 2023-05-21 MED ORDER — METHOCARBAMOL 1000 MG/10ML IJ SOLN: 500.0000 mg | Freq: Four times a day (QID) | INTRAMUSCULAR | Status: DC | PRN

## 2023-05-21 MED ORDER — ONDANSETRON HCL 4 MG/2ML IJ SOLN
INTRAMUSCULAR | Status: DC | PRN
  Administered 2023-05-21: 4 mg via INTRAVENOUS

## 2023-05-21 MED ORDER — 0.9 % SODIUM CHLORIDE (POUR BTL) OPTIME
TOPICAL | Status: DC | PRN
  Administered 2023-05-21: 1000 mL

## 2023-05-21 MED ORDER — ACETAMINOPHEN 500 MG PO TABS
1000.0000 mg | ORAL_TABLET | Freq: Once | ORAL | Status: AC
  Administered 2023-05-21: 1000 mg via ORAL
  Filled 2023-05-21: qty 2

## 2023-05-21 MED ORDER — BUPIVACAINE LIPOSOME 1.3 % IJ SUSP: 10.0000 mL | Freq: Once | INTRAMUSCULAR | Status: DC

## 2023-05-21 MED ORDER — AMISULPRIDE (ANTIEMETIC) 5 MG/2ML IV SOLN
INTRAVENOUS | Status: AC
  Filled 2023-05-21: qty 4

## 2023-05-21 MED ORDER — LACTATED RINGERS IV SOLN: INTRAVENOUS | Status: DC

## 2023-05-21 MED ORDER — OXYCODONE HCL 5 MG PO TABS: 5.0000 mg | ORAL_TABLET | Freq: Once | ORAL | Status: DC | PRN

## 2023-05-21 MED ORDER — ASPIRIN 81 MG PO TBEC: 81.0000 mg | DELAYED_RELEASE_TABLET | Freq: Two times a day (BID) | ORAL | 0 refills | Status: DC

## 2023-05-21 MED ORDER — POVIDONE-IODINE 10 % EX SWAB: 2.0000 | Freq: Once | CUTANEOUS | Status: DC

## 2023-05-21 MED ORDER — FENTANYL CITRATE (PF) 100 MCG/2ML IJ SOLN
INTRAMUSCULAR | Status: AC
  Filled 2023-05-21: qty 2

## 2023-05-21 MED ORDER — STERILE WATER FOR IRRIGATION IR SOLN
Status: DC | PRN
  Administered 2023-05-21: 1000 mL

## 2023-05-21 MED ORDER — BUPIVACAINE LIPOSOME 1.3 % IJ SUSP
INTRAMUSCULAR | Status: AC
  Filled 2023-05-21: qty 10

## 2023-05-21 MED ORDER — AMISULPRIDE (ANTIEMETIC) 5 MG/2ML IV SOLN
10.0000 mg | Freq: Once | INTRAVENOUS | Status: AC | PRN
Start: 2023-05-21 — End: 2023-05-21
  Administered 2023-05-21: 10 mg via INTRAVENOUS

## 2023-05-21 MED ORDER — TRANEXAMIC ACID-NACL 1000-0.7 MG/100ML-% IV SOLN
1000.0000 mg | INTRAVENOUS | Status: AC
  Administered 2023-05-21: 1000 mg via INTRAVENOUS
  Filled 2023-05-21: qty 100

## 2023-05-21 SURGICAL SUPPLY — 37 items
BAG COUNTER SPONGE SURGICOUNT (BAG) ×1 IMPLANT
BAG DECANTER FOR FLEXI CONT (MISCELLANEOUS) ×2 IMPLANT
BLADE SAW SGTL 18X1.27X75 (BLADE) ×1 IMPLANT
COVER PERINEAL POST (MISCELLANEOUS) ×1 IMPLANT
COVER SURGICAL LIGHT HANDLE (MISCELLANEOUS) ×1 IMPLANT
CUP ACETBLR 52 OD 100 SERIES (Hips) IMPLANT
DRAPE STERI IOBAN 125X83 (DRAPES) ×1 IMPLANT
DRAPE U-SHAPE 47X51 STRL (DRAPES) ×2 IMPLANT
DRSG AQUACEL AG ADV 3.5X10 (GAUZE/BANDAGES/DRESSINGS) ×1 IMPLANT
DURAPREP 26ML APPLICATOR (WOUND CARE) ×1 IMPLANT
ELECT BLADE TIP CTD 4 INCH (ELECTRODE) ×1 IMPLANT
ELECT REM PT RETURN 15FT ADLT (MISCELLANEOUS) ×1 IMPLANT
ELIMINATOR HOLE APEX DEPUY (Hips) IMPLANT
FEM STEM 12/14 TAPER SZ 4 HIP (Orthopedic Implant) ×1 IMPLANT
FEMORAL STEM 12/14 TPR SZ4 HIP (Orthopedic Implant) IMPLANT
GLOVE BIO SURGEON STRL SZ7.5 (GLOVE) ×1 IMPLANT
GLOVE BIO SURGEON STRL SZ8.5 (GLOVE) ×1 IMPLANT
GLOVE BIOGEL PI IND STRL 8 (GLOVE) ×1 IMPLANT
GLOVE BIOGEL PI IND STRL 9 (GLOVE) ×1 IMPLANT
GOWN STRL REUS W/ TWL XL LVL3 (GOWN DISPOSABLE) ×2 IMPLANT
HEAD CERAMIC DELTA 36 PLUS 1.5 (Hips) IMPLANT
HOLDER FOLEY CATH W/STRAP (MISCELLANEOUS) ×1 IMPLANT
KIT TURNOVER KIT A (KITS) IMPLANT
LINER NEUTRAL 52X36MM PLUS 4 (Liner) IMPLANT
MANIFOLD NEPTUNE II (INSTRUMENTS) ×1 IMPLANT
NDL HYPO 21X1.5 SAFETY (NEEDLE) ×2 IMPLANT
NEEDLE HYPO 21X1.5 SAFETY (NEEDLE) ×2 IMPLANT
NS IRRIG 1000ML POUR BTL (IV SOLUTION) ×1 IMPLANT
PACK ANTERIOR HIP CUSTOM (KITS) ×1 IMPLANT
SPIKE FLUID TRANSFER (MISCELLANEOUS) ×1 IMPLANT
SUT VIC AB 0 CT1 27XBRD ANBCTR (SUTURE) ×1 IMPLANT
SUT VIC AB 1 CTX36XBRD ANBCTR (SUTURE) ×1 IMPLANT
SUT VIC AB 2-0 CT1 TAPERPNT 27 (SUTURE) ×1 IMPLANT
SUT VICRYL+ 3-0 36IN CT-1 (SUTURE) ×1 IMPLANT
SYR CONTROL 10ML LL (SYRINGE) ×3 IMPLANT
TRAY FOLEY MTR SLVR 16FR STAT (SET/KITS/TRAYS/PACK) IMPLANT
TUBE SUCTION HIGH CAP CLEAR NV (SUCTIONS) ×1 IMPLANT

## 2023-05-21 NOTE — Op Note (Signed)
PATIENT ID:      Frank Howe  MRN:     [id number] DOB/AGE:    [birth year] / 48 y.o.  OPERATIVE REPORT   DATE OF PROCEDURE:  05/21/2023      PREOPERATIVE DIAGNOSIS:  LEFT HIP OSTEOARTHRITIS                                                         POSTOPERATIVE DIAGNOSIS:  Same                                                         PROCEDURE: Anterior L total hip arthroplasty using a 52 mm DePuy Pinnacle  Cup, [employer], 0-degree polyethylene liner, a +1.5 mm x 36mm ceramic head, a 4hi Depuy Actis stem  SURGEON: Nestor Lewandowsky  ASSISTANT:   Tomi Likens. Gaylene Brooks  (present throughout entire procedure and necessary for timely completion of the procedure)   ANESTHESIA: Spinal, Exparel 133mg  injection BLOOD LOSS: 500 cc FLUID REPLACEMENT: 2000 cc crystalloid TRANEXAMIC ACID: 1gm IV, 2gm Topical COMPLICATIONS: none    INDICATIONS FOR PROCEDURE: A 48 y.o. year-old With  LEFT HIP OSTEOARTHRITIS   for 3 years, x-rays show bone-on-bone arthritic changes, and osteophytes. Despite conservative measures with observation, anti-inflammatory medicine, narcotics, use of a cane, has severe unremitting pain and can ambulate only a few blocks before resting. Patient desires elective L total hip arthroplasty to decrease pain and increase function. The risks, benefits, and alternatives were discussed at length including but not limited to the risks of infection, bleeding, nerve injury, stiffness, blood clots, the need for revision surgery, cardiopulmonary complications, among others, and they were willing to proceed. Questions answered      PROCEDURE IN DETAIL: The patient was identified by armband, received preoperative IV antibiotics, in the holding area at [facility], taken to the operating room , appropriate anesthetic monitors were attached and anesthesia was induced with the patient on the gurney. HANA boots were applied to the feet, and the patient  was transferred to the HANA table  with a peroneal post and support underneath the non-operative leg. The operative lower extremity was then prepped and draped in the usual sterile fashion from just above the iliac crest to the knee. And a timeout procedure was performed. Tomi Likens. Vear Clock [facility] was present and scrubbed throughout the case, critical for assistance with, positioning, exposure, retraction, instrumentation, and closure.Skin along incision area was injected with 10 cc of Exparel solution. We then made a 16 cm incision along the interval at the leading edge of the tensor fascia lata of starting at 2 cm lateral to the ASIS. Small bleeders in the skin and subcutaneous tissue identified and cauterized we dissected down to the fascia and made an incision in the fascia allowing [place] to elevate the fascia of the tensor muscle and exploited the interval between the rectus and the tensor fascia lata. A Cobra retractor was then placed along the superior neck of the femur. A cerebellar retractor was used to expose the interval between the tensor fascia lata and the rectus femoris.  We identified and cauterized the ascending branch  of the anterior circumflex artery. A second Cobra retractor along the inferior neck of the femur. A small Hohmann retractor was placed underneath the origin of the rectus femoris, giving [place] good medial exposure. Using Ronguers fatty tissue was removed from in front of the anterior capsule. The capsule was then incised, starting out at the superior anterior rim of the acetabulum going laterally along the anterior neck. The capsule was then teed along the neck superiorly and inferiorly. Electrocautery was used to release capsule from the anterior and medial neck of the femur to allow external rotation. The cobra retractors were then placed along the inferior and superior neck. The hip was externally rotated to 40 degrees, traction applied and locked. We perform a standard neck cut with the oscillating saw and removed the femoral  head with a power corkscrew. We then placed a medium curved medium homan retractor in the cotyloid notch and standard cobra retractor posteriorly along the acetabular rim. Exposed labral tissue and osteophytes were then removed. We then sequentially reamed up to a 51 mm basket reamer obtaining good coverage in all quadrants, verified by C-arm imaging. Under C-arm control we then hammered into place a 52 mm Pinnacle cup in 45 of abduction and 15 of anteversion. The cup seated nicely and required no supplemental screws. We then placed a central hole Eliminator and a +4 mm, 0 polyethylene liner. The foot was then externally rotated to 130-140. The limb was extended and adducted to the floor, delivering the proximal femur up into the wound. A medium curved Hohmann retractor was placed over the greater trochanter and a long Homan retractor along the posterior femoral neck completing the exposure and lateralizing the femur. We then performed releases superiorly and and inferiorly of the capsule going back to the pirformis fossa superiorly and to the lesser trochanter inferiorly. We then entered the proximal femur with the box cutting offset chisel followed by, a canal sounder, the chili pepper and broaching up to a 4 broach. This seated nicely and we reamed the calcar. A trial reduction was performed with a 1.5 mm X 36 mm head.The limb lengths were checked by c-arm, and the hip was stable in 90 of external rotation. At this point the trial components removed and we hammered into place a hi  Offset # 4Actis stem with coating. A + 1 mm x 36 head was then hammered into place. The hip was reduced and final C-arm images obtained. The wound was thoroughly irrigated with normal saline solution. We repaired the ant capsule and the tensor fascia lot a with running 0 vicryl suture. the subcutaneous and subcuticular layers were closed with running 3-0 Vicryl suture followed by an Aquacil dressing. At this point the patient was  awaken and transferred to hospital gurney without difficulty.   Nestor Lewandowsky 05/21/2023, 9:17 AM

## 2023-05-21 NOTE — Anesthesia Procedure Notes (Addendum)
Spinal  Patient location during procedure: OR Start time: 05/21/2023 10:25 AM End time: 05/21/2023 10:35 AM Reason for block: surgical anesthesia Staffing Performed: anesthesiologist  Anesthesiologist: Linton Rump, MD Performed by: Linton Rump, MD Authorized by: Linton Rump, MD   Preanesthetic Checklist Completed: patient identified, IV checked, site marked, risks and benefits discussed, surgical consent, monitors and equipment checked, pre-op evaluation and timeout performed Spinal Block Patient position: sitting Prep: DuraPrep Patient monitoring: blood pressure and continuous pulse ox Approach: midline Location: L3-4 Injection technique: single-shot Needle Needle type: Whitacre  Needle gauge: 25 G Needle length: 9 cm Additional Notes Risks and benefits of neuraxial anesthesia including, but not limited to, infection, bleeding, local anesthetic toxicity, headache, hypotension, back pain, block failure, etc. were discussed with the patient. The patient expressed understanding and consented to the procedure. I confirmed that the patient has no bleeding disorders and is not taking blood thinners. I confirmed the patient's last platelet count with the nurse. Monitors were applied. A time-out was performed immediately prior to the procedure. Sterile technique was used throughout the whole procedure.   1 attempt by CRNA, 3 attempts by MDA. Successful with 5 inch Whitacre.

## 2023-05-21 NOTE — Anesthesia Procedure Notes (Signed)
Procedure Name: MAC Date/Time: 05/21/2023 10:15 AM  Performed by: Elisabeth Cara, CRNAPre-anesthesia Checklist: Patient identified, Emergency Drugs available, Suction available, Patient being monitored and Timeout performed Patient Re-evaluated:Patient Re-evaluated prior to induction Oxygen Delivery Method: Simple face mask Placement Confirmation: positive ETCO2 Dental Injury: Teeth and Oropharynx as per pre-operative assessment

## 2023-05-21 NOTE — Transfer of Care (Signed)
Immediate Anesthesia Transfer of Care Note  Patient: Frank Howe  Procedure(s) Performed: LEFT TOTAL HIP ARTHROPLASTY ANTERIOR APPROACH (Left: Hip)  Patient Location: PACU  Anesthesia Type:MAC and Spinal  Level of Consciousness: awake, alert , oriented, and patient cooperative  Airway & Oxygen Therapy: Patient Spontanous Breathing and Patient connected to face mask oxygen  Post-op Assessment: Report given to RN and Post -op Vital signs reviewed and stable  Post vital signs: Reviewed and stable  Last Vitals:  Vitals Value Taken Time  BP 134/77 05/21/23 1248  Temp    Pulse 83 05/21/23 1249  Resp 22 05/21/23 1249  SpO2 96 % 05/21/23 1249  Vitals shown include unfiled device data.  Last Pain:  Vitals:   05/21/23 0759  TempSrc: Oral  PainSc:          Complications: No notable events documented.

## 2023-05-21 NOTE — Discharge Instructions (Addendum)

## 2023-05-21 NOTE — Interval H&P Note (Signed)
History and Physical Interval Note:  05/21/2023 9:16 AM  Frank Howe  has presented today for surgery, with the diagnosis of LEFT HIP OSTEOARTHRITIS.  The various methods of treatment have been discussed with the patient and family. After consideration of risks, benefits and other options for treatment, the patient has consented to  Procedure(s): LEFT TOTAL HIP ARTHROPLASTY ANTERIOR APPROACH (Left) as a surgical intervention.  The patient's history has been reviewed, patient examined, no change in status, stable for surgery.  I have reviewed the patient's chart and labs.  Questions were answered to the patient's satisfaction.     Nestor Lewandowsky

## 2023-05-21 NOTE — Evaluation (Signed)
Physical Therapy Evaluation Patient Details Name: Mung Rinker MRN: [id number] DOB: 05/16/1975 Today's Date: 05/21/2023  History of Present Illness  48 yo male presents to therapy s/p L THA, anterior approach on 05/21/2023 due to failure of conservative measures. Pt PMH includes but is not limited to: hernia, SBO, HTN, DM II, gout, OSA, anxiety, and arthritis.  Clinical Impression   Navin Dogan is a 48 y.o. male POD 0 s/p L THA. Patient reports IND with mobility at baseline. Patient is now limited by functional impairments (see PT problem list below) and requires CGA and cues for transfers and gait with RW. Patient was able to ambulate 40 feet with RW, increased time and CGA and cues for safe walker management. Patient educated on safe sequencing for stair mobility with use of RW, fall risk prevention, pain management and goal, use of CP/ice pt verbalized understanding.  Patient instructed in exercises to facilitate ROM and circulation reviewed and HO provided. Patient will benefit from continued skilled PT interventions to address impairments and progress towards PLOF. Patient has met mobility goals at adequate level for discharge home with family support and [facility] services; will continue to follow if pt continues acute stay to progress towards Mod I goals.       If plan is discharge home, recommend the following: A little help with walking and/or transfers;A little help with bathing/dressing/bathroom;Assistance with cooking/housework;Assist for transportation;Help with stairs or ramp for entrance   Can travel by private vehicle        Equipment Recommendations Rolling walker (2 wheels)  Recommendations for Other Services       Functional Status Assessment Patient has had a recent decline in their functional status and demonstrates the ability to make significant improvements in function in a reasonable and predictable amount of time.     Precautions / Restrictions  Precautions Precautions: Fall Restrictions Weight Bearing Restrictions Per Provider Order: No      Mobility  Bed Mobility Overal bed mobility: Needs Assistance Bed Mobility: Supine to Sit     Supine to sit: Contact guard, HOB elevated, Used rails     General bed mobility comments: increased time, cues for slidiging  L LE to EOB    Transfers Overall transfer level: Needs assistance Equipment used: Rolling walker (2 wheels) Transfers: Sit to/from Stand Sit to Stand: Contact guard assist           General transfer comment: min cues    Ambulation/Gait Ambulation/Gait assistance: Contact guard assist Gait Distance (Feet): 40 Feet Assistive device: Rolling walker (2 wheels) Gait Pattern/deviations: Step-to pattern, Antalgic, Trunk flexed Gait velocity: decreased     General Gait Details: min cues for proper RW management and posture with B UE support at RW to offload L LE in stance phase, slow cadence with pt requring 1:26 to amb 10 feet.  Stairs Stairs: Yes Stairs assistance: Contact guard assist Stair Management: Two rails Number of Stairs: 3 General stair comments: step navigation instruction with B handrials, pt reports small threshold to enter home and ed provided on proper placement of RW and sequencing for step to pattern  Wheelchair Mobility     Tilt Bed    Modified Rankin (Stroke Patients Only)       Balance Overall balance assessment: Needs assistance Sitting-balance support: Feet supported Sitting balance-Leahy Scale: Good     Standing balance support: Bilateral upper extremity supported, During functional activity, Reliant on assistive device for balance Standing balance-Leahy Scale: Poor  Pertinent Vitals/Pain Pain Assessment Pain Assessment: 0-10 Pain Score: 5  Pain Location: L hip and LE Pain Descriptors / Indicators: Aching, Constant, Discomfort, Dull, Operative site guarding Pain  Intervention(s): Limited activity within patient's tolerance, Monitored during session, Premedicated before session, Patient requesting pain meds-RN notified, RN gave pain meds during session, Repositioned, Ice applied    Home Living Family/patient expects to be discharged to:: Private residence Living Arrangements: Spouse/significant other;Children Available Help at Discharge: Family Type of Home: House Home Access: Stairs to enter Entrance Stairs-Rails: None Entrance Stairs-Number of Steps: 1   Home Layout: Two level;Able to live on main level with bedroom/bathroom Home Equipment: None      Prior Function Prior Level of Function : Independent/Modified Independent;Driving             Mobility Comments: IND for mobiltiy no AD, mod I with all ADLs with the exception of donning socks, IND for ADLs       Extremity/Trunk Assessment        Lower Extremity Assessment Lower Extremity Assessment: LLE deficits/detail LLE Deficits / Details: ankle DF/PF 5/5 LLE Sensation: decreased light touch (buttock and groin region)    Cervical / Trunk Assessment Cervical / Trunk Assessment: Normal  Communication   Communication Communication: No apparent difficulties  Cognition Arousal: Alert Behavior During Therapy: WFL for tasks assessed/performed Overall Cognitive Status: Within Functional Limits for tasks assessed                                          General Comments      Exercises Total Joint Exercises Ankle Circles/Pumps: AROM, Both, 10 reps Quad Sets: AROM, Left, 5 reps Heel Slides: AROM, Left, 5 reps Hip ABduction/ADduction: AROM, Left, 5 reps, Standing Long Arc Quad: AROM, Left, 10 reps Knee Flexion: AROM, Left, 5 reps, Standing Standing Hip Extension: AROM, Left, 5 reps, Standing   Assessment/Plan    PT Assessment Patient needs continued PT services  PT Problem List Decreased strength;Decreased range of motion;Decreased balance;Decreased  activity tolerance;Decreased mobility;Decreased coordination;Pain       PT Treatment Interventions DME instruction;Gait training;Stair training;Functional mobility training;Therapeutic activities;Therapeutic exercise;Balance training;Neuromuscular re-education;Modalities;Patient/family education    PT Goals (Current goals can be found in the Care Plan section)  Acute Rehab PT Goals Patient Stated Goal: get the R THA surgery PT Goal Formulation: With patient Time For Goal Achievement: 06/04/23 Potential to Achieve Goals: Good    Frequency 7X/week     Co-evaluation               AM-PAC PT "6 Clicks" Mobility  Outcome Measure Help needed turning from your back to your side while in a flat bed without using bedrails?: A Little Help needed moving from lying on your back to sitting on the side of a flat bed without using bedrails?: A Little Help needed moving to and from a bed to a chair (including a wheelchair)?: A Little Help needed standing up from a chair using your arms (e.g., wheelchair or bedside chair)?: A Little Help needed to walk in hospital room?: A Little Help needed climbing 3-5 steps with a railing? : A Little 6 Click Score: 18    End of Session Equipment Utilized During Treatment: Gait belt Activity Tolerance: Patient tolerated treatment well Patient left: in chair;with call bell/phone within reach;with nursing/sitter in room Nurse Communication: Mobility status;Other (comment) (pt readiness for d/c from PT standpoint) PT  Visit Diagnosis: Unsteadiness on feet (R26.81);Other abnormalities of gait and mobility (R26.89);Muscle weakness (generalized) (M62.81);Difficulty in walking, not elsewhere classified (R26.2);Pain Pain - Right/Left: Left Pain - part of body: Hip;Leg    Time: 1478-2956 PT Time Calculation (min) (ACUTE ONLY): 54 min   Charges:   PT Evaluation $PT Eval Low Complexity: 1 Low PT Treatments $Gait Training: 8-22 mins $Therapeutic Exercise:  8-22 mins $Therapeutic Activity: 8-22 mins PT General Charges $$ ACUTE PT VISIT: 1 Visit         Johnny Bridge, PT Acute Rehab   Jacqualyn Posey 05/21/2023, 5:00 PM

## 2023-05-22 ENCOUNTER — Encounter (HOSPITAL_COMMUNITY): Payer: Self-pay | Admitting: Orthopedic Surgery

## 2023-05-22 NOTE — Anesthesia Postprocedure Evaluation (Signed)
Anesthesia Post Note  Patient: Frank Howe  Procedure(s) Performed: LEFT TOTAL HIP ARTHROPLASTY ANTERIOR APPROACH (Left: Hip)     Patient location during evaluation: PACU Anesthesia Type: MAC and Spinal Level of consciousness: awake Pain management: pain level controlled Vital Signs Assessment: post-procedure vital signs reviewed and stable Respiratory status: spontaneous breathing, respiratory function stable and nonlabored ventilation Cardiovascular status: blood pressure returned to baseline and stable Postop Assessment: no headache, no backache and no apparent nausea or vomiting Anesthetic complications: no   No notable events documented.  Last Vitals:  Vitals:   05/21/23 1700 05/21/23 1758  BP: (!) 159/88 137/82  Pulse: 74 74  Resp: 16 16  Temp: 36.8 C   SpO2: 96% 98%    Last Pain:  Vitals:   05/21/23 1758  TempSrc:   PainSc: Asleep                 Linton Rump

## 2023-06-03 NOTE — Therapy (Signed)
 OUTPATIENT PHYSICAL THERAPY LOWER EXTREMITY EVALUATION   Patient Name: Frank Howe MRN: [id number] DOB:11/03/1975, 49 y.o., male Today's Date: 06/04/2023  END OF SESSION:  PT End of Session - 06/04/23 0915     Visit Number 1    Number of Visits 12    Date for PT Re-Evaluation 08/02/23    Authorization Type MCD    PT Start Time 0915    PT Stop Time 1000    PT Time Calculation (min) 45 min    Activity Tolerance Patient tolerated treatment well    Behavior During Therapy [facility] for tasks assessed/performed             Past Medical History:  Diagnosis Date   Anxiety    Arthritis    Complication of anesthesia    Fingers numb after surgery   Depression    Diabetes mellitus type 2 in obese 01/15/2021   Elevated blood pressure reading without diagnosis of hypertension    Gout    History of kidney stones    Hypertension    Impaired fasting blood sugar    Obesity    OSA (obstructive sleep apnea)    Past Surgical History:  Procedure Laterality Date   TOTAL HIP ARTHROPLASTY Left 05/21/2023   Procedure: LEFT TOTAL HIP ARTHROPLASTY ANTERIOR APPROACH;  Surgeon: Wendolyn Hamburger, MD;  Location: [facility];  Service: Orthopedics;  Laterality: Left;   UMBILICAL HERNIA REPAIR N/A 09/14/2017   Procedure: LAPAROSCOPIC UMBILICAL HERNIA;  Surgeon: Joyce Nixon, MD;  Location: [facility];  Service: General;  Laterality: N/A;   UMBILICAL HERNIA REPAIR N/A 02/11/2018   Procedure: LAPARASCOPIC REDO REPAIR OF RECURRENT UMBILICAL HERNIA WITH INSERTION OF MESH, LYSIS OF ADHESIONS;  Surgeon: Melvenia Stabs, MD;  Location: [facility];  Service: General;  Laterality: N/A;   XI ROBOTIC ASSISTED VENTRAL HERNIA N/A 03/01/2021   Procedure: ROBOTIC RECURRENT INCISIONAL HERNIA 9CMx9CM REPAIR WITH MESH AND REMOVAL OF INTRAPERITONEAL MESH;  Surgeon: Junie Olds, MD;  Location: [facility];  Service: General;  Laterality: N/A;   Patient Active Problem List   Diagnosis Date Noted   Arthritis of left hip  05/17/2023   Recurrent incisional hernia 03/01/2021   SBO (small bowel obstruction) (HCC) 01/15/2021   Essential hypertension 01/15/2021   Type 2 diabetes mellitus with obesity (HCC) 01/15/2021   Obesity, Class III, BMI 40-49.9 (morbid obesity) (HCC) 01/15/2021   Umbilical hernia with obstruction but no gangrene 02/11/2018   Partial small bowel obstruction (HCC) 09/13/2017   Gout, chronic 07/19/2015   OSA (obstructive sleep apnea) 07/19/2015   Obesity 06/29/2015   Elevated uric acid in blood 06/29/2015   Impaired fasting blood sugar 06/28/2015   Knee swelling 06/28/2015   Right knee pain 06/28/2015   Elevated blood pressure reading without diagnosis of hypertension 06/28/2015   Routine general medical examination at a health care facility 08/21/2011    PCP: Allene Ivan, MD   REFERRING PROVIDER: Wendolyn Hamburger, MD  REFERRING DIAG: S.P LEFT HIP REPLACEMENT . PLEASE SCHEDULE PT APPOINTMERNT WITHIN 48 -72 HOURS OF SURGERY SURGERY DATE IS 1.27..2025  THERAPY DIAG:  Pain in left hip  Muscle weakness (generalized)  Other abnormalities of gait and mobility  Rationale for Evaluation and Treatment: Rehabilitation  ONSET DATE: DOS 05/21/23  SUBJECTIVE:   SUBJECTIVE STATEMENT: Describes mainly stiffness and soreness in L hip following surgery.   Ambulating with a SPC.  Concerned with some discomfort due to "hematoma" at L thigh due to lack of drainage tube(?)  PERTINENT HISTORY: 47  yo male presents to therapy s/p L THA, anterior approach on 05/21/2023 due to failure of conservative measures. Pt PMH includes but is not limited to: hernia, SBO, HTN, DM II, gout, OSA, anxiety, and arthritis. PAIN:  Are you having pain? Yes: NPRS scale: 3-8/10 Pain location: L hip  Pain description: sore/stiff/ache Aggravating factors: L hip flexion to get in a bathtub/car  Relieving factors: rest  PRECAUTIONS: Anterior hip  RED FLAGS: None   WEIGHT BEARING RESTRICTIONS: No  FALLS:  Has  patient fallen in last 6 months? No  LIVING ENVIRONMENT: Lives with: lives with their spouse Lives in: House/apartment Stairs:  yes  Has following equipment at home: Single point cane  OCCUPATION: not working  PLOF: Independent  PATIENT GOALS: To regain my hip function  NEXT MD VISIT: TBD  OBJECTIVE:  Note: Objective measures were completed at Evaluation unless otherwise noted.  DIAGNOSTIC FINDINGS: none  PATIENT SURVEYS:  LEFS 17/80 21% functional  MUSCLE LENGTH: deferred  POSTURE:  flexed hip posture  LOWER EXTREMITY ROM:  Passive ROM Right eval Left eval  Hip flexion  85d  Hip extension    Hip abduction    Hip adduction    Hip internal rotation    Hip external rotation    Knee flexion  80d  Knee extension    Ankle dorsiflexion    Ankle plantarflexion    Ankle inversion    Ankle eversion     (Blank rows = not tested)  LOWER EXTREMITY MMT:  MMT Right eval Left eval  Hip flexion  4-  Hip extension  4-  Hip abduction  3  Hip adduction    Hip internal rotation    Hip external rotation    Knee flexion  4-  Knee extension  4-  Ankle dorsiflexion    Ankle plantarflexion  4-  Ankle inversion    Ankle eversion     (Blank rows = not tested)  LOWER EXTREMITY SPECIAL TESTS:  Deferred post-op  FUNCTIONAL TESTS:  30 seconds chair stand test 7 reps with UE Support  GAIT: Distance walked: 32ft x2 Assistive device utilized: Single point cane Level of assistance: Complete Independence Comments: slow cadence, antalgic gait, decreased step length                                                                                                                                TREATMENT DATE:  [facility] Adult PT Treatment:                                                DATE: 06/04/23 Evaluation   PATIENT EDUCATION:  Education details: Discussed eval findings, rehab rationale and POC and patient is in agreement  Person educated: Patient Education method:  Explanation Education comprehension: verbalized understanding and needs further education  HOME  EXERCISE PROGRAM: TBD  ASSESSMENT:  CLINICAL IMPRESSION: Patient is a 48 y.o. male who was seen today for physical therapy evaluation and treatment for L hip weakness, decreased ROM and discomfort following L THA anterior approach.  Patient presents with decreased ROM in L hip and knee due to soft tissue trauma and swelling.  Bed mobility and transfers functional.  Strength deficits noted with 30s chair stand test.  OBJECTIVE IMPAIRMENTS: Abnormal gait, decreased activity tolerance, decreased balance, decreased coordination, decreased mobility, difficulty walking, decreased ROM, decreased strength, impaired perceived functional ability, improper body mechanics, postural dysfunction, and pain.   ACTIVITY LIMITATIONS: carrying, lifting, bending, sitting, standing, squatting, stairs, and bed mobility  REHAB POTENTIAL: Good  CLINICAL DECISION MAKING: Stable/uncomplicated  EVALUATION COMPLEXITY: Low   GOALS: Goals reviewed with patient? No  SHORT TERM GOALS: Target date: 06/25/2023   Patient to demonstrate independence in HEP  Baseline: TBD Goal status: INITIAL  2.  Assess 2 MWT and establish baseline Baseline: TBD Goal status: INITIAL   LONG TERM GOALS: Target date: 07/16/2023    Patient will increase 30s chair stand reps from 7 to 10 with/without arms to demonstrate and improved functional ability with less pain/difficulty as well as reduce fall risk.  Baseline: 7 reps with UE support Goal status: INITIAL  2.  Patient will score at least 40% on LEFS to signify clinically meaningful improvement in functional abilities.   Baseline: 21% Goal status: INITIAL  3.  Patient will acknowledge 4/10 pain at least once during episode of care   Baseline: 8/10 Goal status: INITIAL  4.  Re-assess 2 MWT Baseline: TBD Goal status: INITIAL  5.  Increase AROM L hip/knee to 90d Baseline:   Passive ROM Right eval Left eval  Hip flexion  85d  Hip extension    Hip abduction    Hip adduction    Hip internal rotation    Hip external rotation    Knee flexion  80d   Goal status: INITIAL  6.  Increase L LE strength to 4/5 Baseline:  MMT Right eval Left eval  Hip flexion  4-  Hip extension  4-  Hip abduction  3  Hip adduction    Hip internal rotation    Hip external rotation    Knee flexion  4-  Knee extension  4-  Ankle dorsiflexion    Ankle plantarflexion  4-   Goal status: INITIAL  PLAN:  PT FREQUENCY: 1-2x/week  PT DURATION: 6 weeks  PLANNED INTERVENTIONS: 97164- PT Re-evaluation, 97110-Therapeutic exercises, 97530- Therapeutic activity, 97112- Neuromuscular re-education, 97535- Self Care, 82956- Manual therapy, [phone]- Gait training, Balance training, and Stair training  PLAN FOR NEXT SESSION: HEP review and update, manual techniques as appropriate, aerobic tasks, ROM and flexibility activities, strengthening and PREs, TPDN, gait and balance training as needed    For all possible CPT codes, reference the Planned Interventions line above.     Check all conditions that are expected to impact treatment: {Conditions expected to impact treatment:Morbid obesity, Respiratory disorders, and Complications related to surgery   If treatment provided at initial evaluation, no treatment charged due to lack of authorization.       Sarinity Dicicco M Laurelin Elson, PT 06/04/2023, 9:39 AM

## 2023-06-04 ENCOUNTER — Other Ambulatory Visit: Payer: Self-pay

## 2023-06-04 ENCOUNTER — Ambulatory Visit: Payer: Medicaid Other | Attending: Orthopedic Surgery

## 2023-06-04 DIAGNOSIS — M6281 Muscle weakness (generalized): Secondary | ICD-10-CM | POA: Insufficient documentation

## 2023-06-04 DIAGNOSIS — M25552 Pain in left hip: Secondary | ICD-10-CM | POA: Insufficient documentation

## 2023-06-04 DIAGNOSIS — R2689 Other abnormalities of gait and mobility: Secondary | ICD-10-CM | POA: Diagnosis present

## 2023-06-05 NOTE — Therapy (Unsigned)
OUTPATIENT PHYSICAL THERAPY LOWER EXTREMITY EVALUATION   Patient Name: Frank Howe MRN: [id number] DOB:02/05/1976, 48 y.o., male Today's Date: 06/06/2023  END OF SESSION:  PT End of Session - 06/06/23 1129     Visit Number 2    Number of Visits 12    Date for PT Re-Evaluation 08/02/23    Authorization Type MCD    Authorization Time Period 2/11-5/12    Authorization - Visit Number 1    Authorization - Number of Visits 14    PT Start Time 1130    PT Stop Time 1225    PT Time Calculation (min) 55 min    Activity Tolerance Patient tolerated treatment well;Patient limited by pain    Behavior During Therapy [facility] for tasks assessed/performed              Past Medical History:  Diagnosis Date   Anxiety    Arthritis    Complication of anesthesia    Fingers numb after surgery   Depression    Diabetes mellitus type 2 in obese 01/15/2021   Elevated blood pressure reading without diagnosis of hypertension    Gout    History of kidney stones    Hypertension    Impaired fasting blood sugar    Obesity    OSA (obstructive sleep apnea)    Past Surgical History:  Procedure Laterality Date   TOTAL HIP ARTHROPLASTY Left 05/21/2023   Procedure: LEFT TOTAL HIP ARTHROPLASTY ANTERIOR APPROACH;  Surgeon: Gean Birchwood, MD;  Location: [facility];  Service: Orthopedics;  Laterality: Left;   UMBILICAL HERNIA REPAIR N/A 09/14/2017   Procedure: LAPAROSCOPIC UMBILICAL HERNIA;  Surgeon: Romie Levee, MD;  Location: [facility];  Service: General;  Laterality: N/A;   UMBILICAL HERNIA REPAIR N/A 02/11/2018   Procedure: LAPARASCOPIC REDO REPAIR OF RECURRENT UMBILICAL HERNIA WITH INSERTION OF MESH, LYSIS OF ADHESIONS;  Surgeon: Andria Meuse, MD;  Location: [facility];  Service: General;  Laterality: N/A;   XI ROBOTIC ASSISTED VENTRAL HERNIA N/A 03/01/2021   Procedure: ROBOTIC RECURRENT INCISIONAL HERNIA 9CMx9CM REPAIR WITH MESH AND REMOVAL OF INTRAPERITONEAL MESH;  Surgeon: Quentin Ore, MD;   Location: [facility];  Service: General;  Laterality: N/A;   Patient Active Problem List   Diagnosis Date Noted   Arthritis of left hip 05/17/2023   Recurrent incisional hernia 03/01/2021   SBO (small bowel obstruction) (HCC) 01/15/2021   Essential hypertension 01/15/2021   Type 2 diabetes mellitus with obesity (HCC) 01/15/2021   Obesity, Class III, BMI 40-49.9 (morbid obesity) (HCC) 01/15/2021   Umbilical hernia with obstruction but no gangrene 02/11/2018   Partial small bowel obstruction (HCC) 09/13/2017   Gout, chronic 07/19/2015   OSA (obstructive sleep apnea) 07/19/2015   Obesity 06/29/2015   Elevated uric acid in blood 06/29/2015   Impaired fasting blood sugar 06/28/2015   Knee swelling 06/28/2015   Right knee pain 06/28/2015   Elevated blood pressure reading without diagnosis of hypertension 06/28/2015   Routine general medical examination at a health care facility 08/21/2011    PCP: Dois Davenport, MD   REFERRING PROVIDER: Gean Birchwood, MD  REFERRING DIAG: S.P LEFT HIP REPLACEMENT . PLEASE SCHEDULE PT APPOINTMERNT WITHIN 48 -72 HOURS OF SURGERY SURGERY DATE IS 1.27..2025  THERAPY DIAG:  Pain in left hip  Muscle weakness (generalized)  Other abnormalities of gait and mobility  Rationale for Evaluation and Treatment: Rehabilitation  ONSET DATE: DOS 05/21/23  SUBJECTIVE:   SUBJECTIVE STATEMENT: Describes minimal to no L hip pain.  Will  undergo R THA in April and R hip main contributor to overall pain.  PERTINENT HISTORY: 48 yo male presents to therapy s/p L THA, anterior approach on 05/21/2023 due to failure of conservative measures. Pt PMH includes but is not limited to: hernia, SBO, HTN, DM II, gout, OSA, anxiety, and arthritis. PAIN:  Are you having pain? Yes: NPRS scale: 3-8/10 Pain location: L hip  Pain description: sore/stiff/ache Aggravating factors: L hip flexion to get in a bathtub/car  Relieving factors: rest  PRECAUTIONS: Anterior hip  RED  FLAGS: None   WEIGHT BEARING RESTRICTIONS: No  FALLS:  Has patient fallen in last 6 months? No  LIVING ENVIRONMENT: Lives with: lives with their spouse Lives in: House/apartment Stairs:  yes  Has following equipment at home: Single point cane  OCCUPATION: not working  PLOF: Independent  PATIENT GOALS: To regain my hip function  NEXT MD VISIT: TBD  OBJECTIVE:  Note: Objective measures were completed at Evaluation unless otherwise noted.  DIAGNOSTIC FINDINGS: none  PATIENT SURVEYS:  LEFS 17/80 21% functional  MUSCLE LENGTH: deferred  POSTURE:  flexed hip posture  LOWER EXTREMITY ROM:  Passive ROM Right eval Left eval  Hip flexion  85d  Hip extension    Hip abduction    Hip adduction    Hip internal rotation    Hip external rotation    Knee flexion  80d  Knee extension    Ankle dorsiflexion    Ankle plantarflexion    Ankle inversion    Ankle eversion     (Blank rows = not tested)  LOWER EXTREMITY MMT:  MMT Right eval Left eval  Hip flexion  4-  Hip extension  4-  Hip abduction  3  Hip adduction    Hip internal rotation    Hip external rotation    Knee flexion  4-  Knee extension  4-  Ankle dorsiflexion    Ankle plantarflexion  4-  Ankle inversion    Ankle eversion     (Blank rows = not tested)  LOWER EXTREMITY SPECIAL TESTS:  Deferred post-op  FUNCTIONAL TESTS:  30 seconds chair stand test 7 reps with UE Support  GAIT: Distance walked: 8ft x2 Assistive device utilized: Single point cane Level of assistance: Complete Independence Comments: slow cadence, antalgic gait, decreased step length                                                                                                                                TREATMENT DATE:  [facility] Adult PT Treatment:                                                DATE: 06/06/23 Therapeutic Exercise: Seated hamstring stretch L 30s x2 Heel slides with strap 15x2 L hip flexor stretch 30s x2  Nustep arms 8 seat 12 8 min L2   Neuromuscular re-ed: Supine hip fallouts RTB 15x B, 15x L only due to R hip pain Bridge against RTB 15x FAQs with adduction STS 1x with UE support limited by R hip pain. Therapeutic Activity: Supine L hip IR/ER 15x2 for edema management SAQ 15x Hell raises with UE support 15x  [facility] Adult PT Treatment:                                                DATE: 06/04/23 Evaluation   PATIENT EDUCATION:  Education details: Discussed eval findings, rehab rationale and POC and patient is in agreement  Person educated: Patient Education method: Explanation Education comprehension: verbalized understanding and needs further education  HOME EXERCISE PROGRAM: Access Code: [email or web address] URL: [email or web address] Date: 06/06/2023 Prepared by: Gustavus Bryant  Exercises - Standing Heel Raise with Support  - 2-3 x daily - 5 x weekly - 1 sets - 15 reps - Seated Table Hamstring Stretch  - 2-3 x daily - 5 x weekly - 1 sets - 2 reps - 30s hold - Supine Heel Slide with Strap  - 2-3 x daily - 5 x weekly - 1 sets - 15 reps - Modified Thomas Stretch  - 2-3 x daily - 5 x weekly - 1 sets - 2 reps - 30s hold  ASSESSMENT:  CLINICAL IMPRESSION: First f/u session.  Focus of session was restoring hip and LLE mobility and attempting to mobilize fluid buildup in anterior aspect of hip.  Advanced to gentle strength and stabilization tasks as tolerated.  Patient limited by R hip symptoms more than L hip.  (Eval)Patient is a 48 y.o. male who was seen today for physical therapy evaluation and treatment for L hip weakness, decreased ROM and discomfort following L THA anterior approach.  Patient presents with decreased ROM in L hip and knee due to soft tissue trauma and swelling.  Bed mobility and transfers functional.  Strength deficits noted with 30s chair stand test.  OBJECTIVE IMPAIRMENTS: Abnormal gait, decreased activity tolerance, decreased balance, decreased  coordination, decreased mobility, difficulty walking, decreased ROM, decreased strength, impaired perceived functional ability, improper body mechanics, postural dysfunction, and pain.   ACTIVITY LIMITATIONS: carrying, lifting, bending, sitting, standing, squatting, stairs, and bed mobility  REHAB POTENTIAL: Good  CLINICAL DECISION MAKING: Stable/uncomplicated  EVALUATION COMPLEXITY: Low   GOALS: Goals reviewed with patient? No  SHORT TERM GOALS: Target date: 06/25/2023   Patient to demonstrate independence in HEP  Baseline: TBD Goal status: INITIAL  2.  Assess 2 MWT and establish baseline Baseline: TBD Goal status: INITIAL   LONG TERM GOALS: Target date: 07/16/2023    Patient will increase 30s chair stand reps from 7 to 10 with/without arms to demonstrate and improved functional ability with less pain/difficulty as well as reduce fall risk.  Baseline: 7 reps with UE support Goal status: INITIAL  2.  Patient will score at least 40% on LEFS to signify clinically meaningful improvement in functional abilities.   Baseline: 21% Goal status: INITIAL  3.  Patient will acknowledge 4/10 pain at least once during episode of care   Baseline: 8/10 Goal status: INITIAL  4.  Re-assess 2 MWT Baseline: TBD Goal status: INITIAL  5.  Increase AROM L hip/knee to 90d Baseline:  Passive ROM Right eval Left eval  Hip flexion  85d  Hip extension    Hip abduction    Hip adduction    Hip internal rotation    Hip external rotation    Knee flexion  80d   Goal status: INITIAL  6.  Increase L LE strength to 4/5 Baseline:  MMT Right eval Left eval  Hip flexion  4-  Hip extension  4-  Hip abduction  3  Hip adduction    Hip internal rotation    Hip external rotation    Knee flexion  4-  Knee extension  4-  Ankle dorsiflexion    Ankle plantarflexion  4-   Goal status: INITIAL  PLAN:  PT FREQUENCY: 1-2x/week  PT DURATION: 6 weeks  PLANNED INTERVENTIONS: 97164- PT  Re-evaluation, 97110-Therapeutic exercises, 97530- Therapeutic activity, 97112- Neuromuscular re-education, 97535- Self Care, 16109- Manual therapy, [phone]- Gait training, Balance training, and Stair training  PLAN FOR NEXT SESSION: HEP review and update, manual techniques as appropriate, aerobic tasks, ROM and flexibility activities, strengthening and PREs, TPDN, gait and balance training as needed    For all possible CPT codes, reference the Planned Interventions line above.     Check all conditions that are expected to impact treatment: {Conditions expected to impact treatment:Morbid obesity, Respiratory disorders, and Complications related to surgery   If treatment provided at initial evaluation, no treatment charged due to lack of authorization.       Hildred Laser, PT 06/06/2023, 12:37 PM

## 2023-06-06 ENCOUNTER — Ambulatory Visit: Payer: Medicaid Other

## 2023-06-06 DIAGNOSIS — R2689 Other abnormalities of gait and mobility: Secondary | ICD-10-CM

## 2023-06-06 DIAGNOSIS — M25552 Pain in left hip: Secondary | ICD-10-CM

## 2023-06-06 DIAGNOSIS — M6281 Muscle weakness (generalized): Secondary | ICD-10-CM

## 2023-06-10 NOTE — Therapy (Unsigned)
 OUTPATIENT PHYSICAL THERAPY TREATMENT NOTE   Patient Name: Frank Howe MRN: [id number] DOB:Dec 16, 1975, 48 y.o., male Today's Date: 06/11/2023  END OF SESSION:  PT End of Session - 06/11/23 0927     Visit Number 3    Number of Visits 12    Date for PT Re-Evaluation 08/02/23    Authorization Type MCD    Authorization Time Period 2/11-5/12    PT Start Time 0926   late for appointment   PT Stop Time 1000    PT Time Calculation (min) 34 min    Activity Tolerance Patient tolerated treatment well;Patient limited by pain    Behavior During Therapy [facility] for tasks assessed/performed               Past Medical History:  Diagnosis Date   Anxiety    Arthritis    Complication of anesthesia    Fingers numb after surgery   Depression    Diabetes mellitus type 2 in obese 01/15/2021   Elevated blood pressure reading without diagnosis of hypertension    Gout    History of kidney stones    Hypertension    Impaired fasting blood sugar    Obesity    OSA (obstructive sleep apnea)    Past Surgical History:  Procedure Laterality Date   TOTAL HIP ARTHROPLASTY Left 05/21/2023   Procedure: LEFT TOTAL HIP ARTHROPLASTY ANTERIOR APPROACH;  Surgeon: Gean Birchwood, MD;  Location: [facility];  Service: Orthopedics;  Laterality: Left;   UMBILICAL HERNIA REPAIR N/A 09/14/2017   Procedure: LAPAROSCOPIC UMBILICAL HERNIA;  Surgeon: Romie Levee, MD;  Location: [facility];  Service: General;  Laterality: N/A;   UMBILICAL HERNIA REPAIR N/A 02/11/2018   Procedure: LAPARASCOPIC REDO REPAIR OF RECURRENT UMBILICAL HERNIA WITH INSERTION OF MESH, LYSIS OF ADHESIONS;  Surgeon: Andria Meuse, MD;  Location: [facility];  Service: General;  Laterality: N/A;   XI ROBOTIC ASSISTED VENTRAL HERNIA N/A 03/01/2021   Procedure: ROBOTIC RECURRENT INCISIONAL HERNIA 9CMx9CM REPAIR WITH MESH AND REMOVAL OF INTRAPERITONEAL MESH;  Surgeon: Quentin Ore, MD;  Location: [facility];  Service: General;  Laterality: N/A;    Patient Active Problem List   Diagnosis Date Noted   Arthritis of left hip 05/17/2023   Recurrent incisional hernia 03/01/2021   SBO (small bowel obstruction) (HCC) 01/15/2021   Essential hypertension 01/15/2021   Type 2 diabetes mellitus with obesity (HCC) 01/15/2021   Obesity, Class III, BMI 40-49.9 (morbid obesity) (HCC) 01/15/2021   Umbilical hernia with obstruction but no gangrene 02/11/2018   Partial small bowel obstruction (HCC) 09/13/2017   Gout, chronic 07/19/2015   OSA (obstructive sleep apnea) 07/19/2015   Obesity 06/29/2015   Elevated uric acid in blood 06/29/2015   Impaired fasting blood sugar 06/28/2015   Knee swelling 06/28/2015   Right knee pain 06/28/2015   Elevated blood pressure reading without diagnosis of hypertension 06/28/2015   Routine general medical examination at a health care facility 08/21/2011    PCP: Dois Davenport, MD   REFERRING PROVIDER: Gean Birchwood, MD  REFERRING DIAG: S.P LEFT HIP REPLACEMENT . PLEASE SCHEDULE PT APPOINTMERNT WITHIN 48 -72 HOURS OF SURGERY SURGERY DATE IS 1.27..2025  THERAPY DIAG:  Pain in left hip  Muscle weakness (generalized)  Other abnormalities of gait and mobility  Rationale for Evaluation and Treatment: Rehabilitation  ONSET DATE: DOS 05/21/23  SUBJECTIVE:   SUBJECTIVE STATEMENT: L hip beginning to loosen up.  Still reports irritation at incision site from skin folds  PERTINENT HISTORY: 48 yo male  presents to therapy s/p L THA, anterior approach on 05/21/2023 due to failure of conservative measures. Pt PMH includes but is not limited to: hernia, SBO, HTN, DM II, gout, OSA, anxiety, and arthritis. PAIN:  Are you having pain? Yes: NPRS scale: 3-8/10 Pain location: L hip  Pain description: sore/stiff/ache Aggravating factors: L hip flexion to get in a bathtub/car  Relieving factors: rest  PRECAUTIONS: Anterior hip  RED FLAGS: None   WEIGHT BEARING RESTRICTIONS: No  FALLS:  Has patient fallen in  last 6 months? No  LIVING ENVIRONMENT: Lives with: lives with their spouse Lives in: House/apartment Stairs:  yes  Has following equipment at home: Single point cane  OCCUPATION: not working  PLOF: Independent  PATIENT GOALS: To regain my hip function  NEXT MD VISIT: TBD  OBJECTIVE:  Note: Objective measures were completed at Evaluation unless otherwise noted.  DIAGNOSTIC FINDINGS: none  PATIENT SURVEYS:  LEFS 17/80 21% functional  MUSCLE LENGTH: deferred  POSTURE:  flexed hip posture  LOWER EXTREMITY ROM:  Passive ROM Right eval Left eval  Hip flexion  85d  Hip extension    Hip abduction    Hip adduction    Hip internal rotation    Hip external rotation    Knee flexion  80d  Knee extension    Ankle dorsiflexion    Ankle plantarflexion    Ankle inversion    Ankle eversion     (Blank rows = not tested)  LOWER EXTREMITY MMT:  MMT Right eval Left eval  Hip flexion  4-  Hip extension  4-  Hip abduction  3  Hip adduction    Hip internal rotation    Hip external rotation    Knee flexion  4-  Knee extension  4-  Ankle dorsiflexion    Ankle plantarflexion  4-  Ankle inversion    Ankle eversion     (Blank rows = not tested)  LOWER EXTREMITY SPECIAL TESTS:  Deferred post-op  FUNCTIONAL TESTS:  30 seconds chair stand test 7 reps with UE Support  GAIT: Distance walked: 39ft x2 Assistive device utilized: Single point cane Level of assistance: Complete Independence Comments: slow cadence, antalgic gait, decreased step length                                                                                                                                TREATMENT DATE:  [facility] Adult PT Treatment:                                                DATE: 06/11/23 Therapeutic Exercise: Heel slides with strap 15x L hip flexor stretch 30s x2  Nustep (arms 8 seat 12) 6 min L4  Therapeutic Activity: Supine hip fallouts GTB 10x B, 10x L only due to R hip  pain  Bridge against GTB 10x Bridge with ball squeeze 10x L clams in R S/L 10x Supine march L only 10x  [facility] Adult PT Treatment:                                                DATE: 06/06/23 Therapeutic Exercise: Seated hamstring stretch L 30s x2 Heel slides with strap 15x2 L hip flexor stretch 30s x2  Nustep arms 8 seat 12 8 min L2   Neuromuscular re-ed: Supine hip fallouts RTB 15x B, 15x L only due to R hip pain Bridge against RTB 15x FAQs with adduction STS 1x with UE support limited by R hip pain. Therapeutic Activity: Supine L hip IR/ER 15x2 for edema management SAQ 15x Hell raises with UE support 15x  [facility] Adult PT Treatment:                                                DATE: 06/04/23 Evaluation   PATIENT EDUCATION:  Education details: Discussed eval findings, rehab rationale and POC and patient is in agreement  Person educated: Patient Education method: Explanation Education comprehension: verbalized understanding and needs further education  HOME EXERCISE PROGRAM: Access Code: [email or web address] URL: [email or web address] Date: 06/06/2023 Prepared by: Gustavus Bryant  Exercises - Standing Heel Raise with Support  - 2-3 x daily - 5 x weekly - 1 sets - 15 reps - Seated Table Hamstring Stretch  - 2-3 x daily - 5 x weekly - 1 sets - 2 reps - 30s hold - Supine Heel Slide with Strap  - 2-3 x daily - 5 x weekly - 1 sets - 15 reps - Modified Thomas Stretch  - 2-3 x daily - 5 x weekly - 1 sets - 2 reps - 30s hold  ASSESSMENT:  CLINICAL IMPRESSION: L hip feels like it is "loosening up" and motion is more fluid today.  Treatment time amended due to late arrival.  Increased resistance on hip exercises to tolerance.  Able to use RLE during todays session w/o discomfort  (Eval)Patient is a 48 y.o. male who was seen today for physical therapy evaluation and treatment for L hip weakness, decreased ROM and discomfort following L THA anterior approach.  Patient presents with  decreased ROM in L hip and knee due to soft tissue trauma and swelling.  Bed mobility and transfers functional.  Strength deficits noted with 30s chair stand test.  OBJECTIVE IMPAIRMENTS: Abnormal gait, decreased activity tolerance, decreased balance, decreased coordination, decreased mobility, difficulty walking, decreased ROM, decreased strength, impaired perceived functional ability, improper body mechanics, postural dysfunction, and pain.   ACTIVITY LIMITATIONS: carrying, lifting, bending, sitting, standing, squatting, stairs, and bed mobility  REHAB POTENTIAL: Good  CLINICAL DECISION MAKING: Stable/uncomplicated  EVALUATION COMPLEXITY: Low   GOALS: Goals reviewed with patient? No  SHORT TERM GOALS: Target date: 06/25/2023   Patient to demonstrate independence in HEP  Baseline: TBD Goal status: INITIAL  2.  Assess 2 MWT and establish baseline Baseline: TBD Goal status: INITIAL   LONG TERM GOALS: Target date: 07/16/2023    Patient will increase 30s chair stand reps from 7 to 10 with/without arms to demonstrate and improved functional ability with less pain/difficulty as well as reduce fall risk.  Baseline: 7  reps with UE support Goal status: INITIAL  2.  Patient will score at least 40% on LEFS to signify clinically meaningful improvement in functional abilities.   Baseline: 21% Goal status: INITIAL  3.  Patient will acknowledge 4/10 pain at least once during episode of care   Baseline: 8/10 Goal status: INITIAL  4.  Re-assess 2 MWT Baseline: TBD Goal status: INITIAL  5.  Increase AROM L hip/knee to 90d Baseline:  Passive ROM Right eval Left eval  Hip flexion  85d  Hip extension    Hip abduction    Hip adduction    Hip internal rotation    Hip external rotation    Knee flexion  80d   Goal status: INITIAL  6.  Increase L LE strength to 4/5 Baseline:  MMT Right eval Left eval  Hip flexion  4-  Hip extension  4-  Hip abduction  3  Hip adduction    Hip  internal rotation    Hip external rotation    Knee flexion  4-  Knee extension  4-  Ankle dorsiflexion    Ankle plantarflexion  4-   Goal status: INITIAL  PLAN:  PT FREQUENCY: 1-2x/week  PT DURATION: 6 weeks  PLANNED INTERVENTIONS: 97164- PT Re-evaluation, 97110-Therapeutic exercises, 97530- Therapeutic activity, 97112- Neuromuscular re-education, 97535- Self Care, 16109- Manual therapy, [phone]- Gait training, Balance training, and Stair training  PLAN FOR NEXT SESSION: HEP review and update, manual techniques as appropriate, aerobic tasks, ROM and flexibility activities, strengthening and PREs, TPDN, gait and balance training as needed    For all possible CPT codes, reference the Planned Interventions line above.     Check all conditions that are expected to impact treatment: {Conditions expected to impact treatment:Morbid obesity, Respiratory disorders, and Complications related to surgery   If treatment provided at initial evaluation, no treatment charged due to lack of authorization.       Hildred Laser, PT 06/11/2023, 9:58 AM

## 2023-06-11 ENCOUNTER — Ambulatory Visit: Payer: Medicaid Other

## 2023-06-11 DIAGNOSIS — M6281 Muscle weakness (generalized): Secondary | ICD-10-CM

## 2023-06-11 DIAGNOSIS — M25552 Pain in left hip: Secondary | ICD-10-CM | POA: Diagnosis not present

## 2023-06-11 DIAGNOSIS — R2689 Other abnormalities of gait and mobility: Secondary | ICD-10-CM

## 2023-06-13 ENCOUNTER — Ambulatory Visit: Payer: Medicaid Other

## 2023-06-13 DIAGNOSIS — M25552 Pain in left hip: Secondary | ICD-10-CM | POA: Diagnosis not present

## 2023-06-13 DIAGNOSIS — R2689 Other abnormalities of gait and mobility: Secondary | ICD-10-CM

## 2023-06-13 DIAGNOSIS — M6281 Muscle weakness (generalized): Secondary | ICD-10-CM

## 2023-06-13 NOTE — Therapy (Signed)
 OUTPATIENT PHYSICAL THERAPY TREATMENT NOTE   Patient Name: Frank Howe MRN: [id number] DOB:[birth year], 48 y.o., male Today's Date: 06/13/2023  END OF SESSION:  PT End of Session - 06/13/23 1030     Visit Number 4    Number of Visits 12    Date for PT Re-Evaluation 08/02/23    Authorization Type MCD    Authorization Time Period 14 visits approved 06/05/23-09/03/23    Authorization - Visit Number 3    Authorization - Number of Visits 14    PT Start Time 1045    PT Stop Time 1123    PT Time Calculation (min) 38 min    Activity Tolerance Patient tolerated treatment well;Patient limited by pain    Behavior During Therapy [facility] for tasks assessed/performed             Past Medical History:  Diagnosis Date   Anxiety    Arthritis    Complication of anesthesia    Fingers numb after surgery   Depression    Diabetes mellitus type 2 in obese 01/15/2021   Elevated blood pressure reading without diagnosis of hypertension    Gout    History of kidney stones    Hypertension    Impaired fasting blood sugar    Obesity    OSA (obstructive sleep apnea)    Past Surgical History:  Procedure Laterality Date   TOTAL HIP ARTHROPLASTY Left 05/21/2023   Procedure: LEFT TOTAL HIP ARTHROPLASTY ANTERIOR APPROACH;  Surgeon: Gean Birchwood, MD;  Location: [facility];  Service: Orthopedics;  Laterality: Left;   UMBILICAL HERNIA REPAIR N/A 09/14/2017   Procedure: LAPAROSCOPIC UMBILICAL HERNIA;  Surgeon: Romie Levee, MD;  Location: [facility];  Service: General;  Laterality: N/A;   UMBILICAL HERNIA REPAIR N/A 02/11/2018   Procedure: LAPARASCOPIC REDO REPAIR OF RECURRENT UMBILICAL HERNIA WITH INSERTION OF MESH, LYSIS OF ADHESIONS;  Surgeon: Andria Meuse, MD;  Location: [facility];  Service: General;  Laterality: N/A;   XI ROBOTIC ASSISTED VENTRAL HERNIA N/A 03/01/2021   Procedure: ROBOTIC RECURRENT INCISIONAL HERNIA 9CMx9CM REPAIR WITH MESH AND REMOVAL OF INTRAPERITONEAL MESH;  Surgeon: Quentin Ore, MD;  Location: [facility];  Service: General;  Laterality: N/A;   Patient Active Problem List   Diagnosis Date Noted   Arthritis of left hip 05/17/2023   Recurrent incisional hernia 03/01/2021   SBO (small bowel obstruction) (HCC) 01/15/2021   Essential hypertension 01/15/2021   Type 2 diabetes mellitus with obesity (HCC) 01/15/2021   Obesity, Class III, BMI 40-49.9 (morbid obesity) (HCC) 01/15/2021   Umbilical hernia with obstruction but no gangrene 02/11/2018   Partial small bowel obstruction (HCC) 09/13/2017   Gout, chronic 07/19/2015   OSA (obstructive sleep apnea) 07/19/2015   Obesity 06/29/2015   Elevated uric acid in blood 06/29/2015   Impaired fasting blood sugar 06/28/2015   Knee swelling 06/28/2015   Right knee pain 06/28/2015   Elevated blood pressure reading without diagnosis of hypertension 06/28/2015   Routine general medical examination at a health care facility 08/21/2011    PCP: Dois Davenport, MD   REFERRING PROVIDER: Gean Birchwood, MD  REFERRING DIAG: S.P LEFT HIP REPLACEMENT . PLEASE SCHEDULE PT APPOINTMERNT WITHIN 48 -72 HOURS OF SURGERY SURGERY DATE IS 1.27..2025  THERAPY DIAG:  Pain in left hip  Other abnormalities of gait and mobility  Muscle weakness (generalized)  Rationale for Evaluation and Treatment: Rehabilitation  ONSET DATE: DOS 05/21/23  SUBJECTIVE:   SUBJECTIVE STATEMENT: Patient reports that his Lt hip feels more  stiff today, has been compliant with HEP. He also notes that he can lay on the Lt side with more ease now.   PERTINENT HISTORY: 48 yo male presents to therapy s/p L THA, anterior approach on 05/21/2023 due to failure of conservative measures. Pt PMH includes but is not limited to: hernia, SBO, HTN, DM II, gout, OSA, anxiety, and arthritis. PAIN:  Are you having pain? Yes: NPRS scale: 3-8/10 Pain location: L hip  Pain description: sore/stiff/ache Aggravating factors: L hip flexion to get in a bathtub/car  Relieving factors:  rest  PRECAUTIONS: Anterior hip  RED FLAGS: None   WEIGHT BEARING RESTRICTIONS: No  FALLS:  Has patient fallen in last 6 months? No  LIVING ENVIRONMENT: Lives with: lives with their spouse Lives in: House/apartment Stairs:  yes  Has following equipment at home: Single point cane  OCCUPATION: not working  PLOF: Independent  PATIENT GOALS: To regain my hip function  NEXT MD VISIT: TBD  OBJECTIVE:  Note: Objective measures were completed at Evaluation unless otherwise noted.  DIAGNOSTIC FINDINGS: none  PATIENT SURVEYS:  LEFS 17/80 21% functional  MUSCLE LENGTH: deferred  POSTURE:  flexed hip posture  LOWER EXTREMITY ROM:  Passive ROM Right eval Left eval  Hip flexion  85d  Hip extension    Hip abduction    Hip adduction    Hip internal rotation    Hip external rotation    Knee flexion  80d  Knee extension    Ankle dorsiflexion    Ankle plantarflexion    Ankle inversion    Ankle eversion     (Blank rows = not tested)  LOWER EXTREMITY MMT:  MMT Right eval Left eval  Hip flexion  4-  Hip extension  4-  Hip abduction  3  Hip adduction    Hip internal rotation    Hip external rotation    Knee flexion  4-  Knee extension  4-  Ankle dorsiflexion    Ankle plantarflexion  4-  Ankle inversion    Ankle eversion     (Blank rows = not tested)  LOWER EXTREMITY SPECIAL TESTS:  Deferred post-op  FUNCTIONAL TESTS:  30 seconds chair stand test 7 reps with UE Support  GAIT: Distance walked: 42ft x2 Assistive device utilized: Single point cane Level of assistance: Complete Independence Comments: slow cadence, antalgic gait, decreased step length                                                                                                                                TREATMENT DATE:  [facility] Adult PT Treatment:                                                DATE: 06/13/23 Therapeutic Exercise: Heel slides with strap 15x L hip flexor stretch  30s  x2  Nustep (arms 8 seat 12) 6 min L4  Therapeutic Activity: Supine hip fallouts BlueTB 10x B, 10x L/R Bridge against BlueTB 2x10 Bridge with ball squeeze 10x L clams in R S/L 15x Supine march L only 10x STS arms crossed x10 from raised table   [facility] Adult PT Treatment:                                                DATE: 06/11/23 Therapeutic Exercise: Heel slides with strap 15x L hip flexor stretch 30s x2  Nustep (arms 8 seat 12) 6 min L4  Therapeutic Activity: Supine hip fallouts GTB 10x B, 10x L only due to R hip pain Bridge against GTB 10x Bridge with ball squeeze 10x L clams in R S/L 10x Supine march L only 10x  [facility] Adult PT Treatment:                                                DATE: 06/06/23 Therapeutic Exercise: Seated hamstring stretch L 30s x2 Heel slides with strap 15x2 L hip flexor stretch 30s x2  Nustep arms 8 seat 12 8 min L2   Neuromuscular re-ed: Supine hip fallouts RTB 15x B, 15x L only due to R hip pain Bridge against RTB 15x FAQs with adduction STS 1x with UE support limited by R hip pain. Therapeutic Activity: Supine L hip IR/ER 15x2 for edema management SAQ 15x Hell raises with UE support 15x    PATIENT EDUCATION:  Education details: Discussed eval findings, rehab rationale and POC and patient is in agreement  Person educated: Patient Education method: Explanation Education comprehension: verbalized understanding and needs further education  HOME EXERCISE PROGRAM: Access Code: [email or web address] URL: [email or web address] Date: 06/06/2023 Prepared by: Gustavus Bryant  Exercises - Standing Heel Raise with Support  - 2-3 x daily - 5 x weekly - 1 sets - 15 reps - Seated Table Hamstring Stretch  - 2-3 x daily - 5 x weekly - 1 sets - 2 reps - 30s hold - Supine Heel Slide with Strap  - 2-3 x daily - 5 x weekly - 1 sets - 15 reps - Modified Thomas Stretch  - 2-3 x daily - 5 x weekly - 1 sets - 2 reps - 30s hold  ASSESSMENT:  CLINICAL  IMPRESSION:  Patient reports that his hip feels stiff today and he notices it more when he sits for an extended period of time. Session today continued to focus on hip strengthening, stretching, and mobility. Increased resistance and repetitions today to good effect, no increased pain throughout session. Patient was able to tolerate all prescribed exercises with no adverse effects. Patient continues to benefit from skilled PT services and should be progressed as able to improve functional independence.    (Eval) Patient is a 48 y.o. male who was seen today for physical therapy evaluation and treatment for L hip weakness, decreased ROM and discomfort following L THA anterior approach.  Patient presents with decreased ROM in L hip and knee due to soft tissue trauma and swelling.  Bed mobility and transfers functional.  Strength deficits noted with 30s chair stand test.  OBJECTIVE IMPAIRMENTS: Abnormal gait, decreased activity tolerance, decreased balance, decreased coordination, decreased  mobility, difficulty walking, decreased ROM, decreased strength, impaired perceived functional ability, improper body mechanics, postural dysfunction, and pain.   ACTIVITY LIMITATIONS: carrying, lifting, bending, sitting, standing, squatting, stairs, and bed mobility  REHAB POTENTIAL: Good  CLINICAL DECISION MAKING: Stable/uncomplicated  EVALUATION COMPLEXITY: Low   GOALS: Goals reviewed with patient? No  SHORT TERM GOALS: Target date: 06/25/2023   Patient to demonstrate independence in HEP  Baseline: TBD Goal status: INITIAL  2.  Assess 2 MWT and establish baseline Baseline: TBD Goal status: INITIAL   LONG TERM GOALS: Target date: 07/16/2023    Patient will increase 30s chair stand reps from 7 to 10 with/without arms to demonstrate and improved functional ability with less pain/difficulty as well as reduce fall risk.  Baseline: 7 reps with UE support Goal status: INITIAL  2.  Patient will score at  least 40% on LEFS to signify clinically meaningful improvement in functional abilities.   Baseline: 21% Goal status: INITIAL  3.  Patient will acknowledge 4/10 pain at least once during episode of care   Baseline: 8/10 Goal status: INITIAL  4.  Re-assess 2 MWT Baseline: TBD Goal status: INITIAL  5.  Increase AROM L hip/knee to 90d Baseline:  Passive ROM Right eval Left eval  Hip flexion  85d  Hip extension    Hip abduction    Hip adduction    Hip internal rotation    Hip external rotation    Knee flexion  80d   Goal status: INITIAL  6.  Increase L LE strength to 4/5 Baseline:  MMT Right eval Left eval  Hip flexion  4-  Hip extension  4-  Hip abduction  3  Hip adduction    Hip internal rotation    Hip external rotation    Knee flexion  4-  Knee extension  4-  Ankle dorsiflexion    Ankle plantarflexion  4-   Goal status: INITIAL  PLAN:  PT FREQUENCY: 1-2x/week  PT DURATION: 6 weeks  PLANNED INTERVENTIONS: 97164- PT Re-evaluation, 97110-Therapeutic exercises, 97530- Therapeutic activity, 97112- Neuromuscular re-education, 97535- Self Care, 09811- Manual therapy, [phone]- Gait training, Balance training, and Stair training  PLAN FOR NEXT SESSION: HEP review and update, manual techniques as appropriate, aerobic tasks, ROM and flexibility activities, strengthening and PREs, TPDN, gait and balance training as needed    For all possible CPT codes, reference the Planned Interventions line above.     Check all conditions that are expected to impact treatment: {Conditions expected to impact treatment:Morbid obesity, Respiratory disorders, and Complications related to surgery   If treatment provided at initial evaluation, no treatment charged due to lack of authorization.       Berta Minor, PTA 06/13/2023, 11:25 AM

## 2023-06-20 ENCOUNTER — Ambulatory Visit: Payer: Medicaid Other

## 2023-06-20 DIAGNOSIS — M25552 Pain in left hip: Secondary | ICD-10-CM | POA: Diagnosis not present

## 2023-06-20 DIAGNOSIS — M6281 Muscle weakness (generalized): Secondary | ICD-10-CM

## 2023-06-20 NOTE — Therapy (Signed)
 OUTPATIENT PHYSICAL THERAPY TREATMENT NOTE   Patient Name: Frank Howe MRN: [id number] DOB:January 07, 1976, 48 y.o., male Today's Date: 06/20/2023  END OF SESSION:  PT End of Session - 06/20/23 1047     Visit Number 5    Number of Visits 12    Date for PT Re-Evaluation 08/02/23    Authorization Type MCD    Authorization Time Period 14 visits approved 06/05/23-09/03/23    Authorization - Visit Number 4    Authorization - Number of Visits 14    PT Start Time 1045    PT Stop Time 1125    PT Time Calculation (min) 40 min    Activity Tolerance Patient tolerated treatment well;Patient limited by pain    Behavior During Therapy [facility] for tasks assessed/performed            Past Medical History:  Diagnosis Date   Anxiety    Arthritis    Complication of anesthesia    Fingers numb after surgery   Depression    Diabetes mellitus type 2 in obese 01/15/2021   Elevated blood pressure reading without diagnosis of hypertension    Gout    History of kidney stones    Hypertension    Impaired fasting blood sugar    Obesity    OSA (obstructive sleep apnea)    Past Surgical History:  Procedure Laterality Date   TOTAL HIP ARTHROPLASTY Left 05/21/2023   Procedure: LEFT TOTAL HIP ARTHROPLASTY ANTERIOR APPROACH;  Surgeon: Gean Birchwood, MD;  Location: [facility];  Service: Orthopedics;  Laterality: Left;   UMBILICAL HERNIA REPAIR N/A 09/14/2017   Procedure: LAPAROSCOPIC UMBILICAL HERNIA;  Surgeon: Romie Levee, MD;  Location: [facility];  Service: General;  Laterality: N/A;   UMBILICAL HERNIA REPAIR N/A 02/11/2018   Procedure: LAPARASCOPIC REDO REPAIR OF RECURRENT UMBILICAL HERNIA WITH INSERTION OF MESH, LYSIS OF ADHESIONS;  Surgeon: Andria Meuse, MD;  Location: [facility];  Service: General;  Laterality: N/A;   XI ROBOTIC ASSISTED VENTRAL HERNIA N/A 03/01/2021   Procedure: ROBOTIC RECURRENT INCISIONAL HERNIA 9CMx9CM REPAIR WITH MESH AND REMOVAL OF INTRAPERITONEAL MESH;  Surgeon: Quentin Ore, MD;  Location: [facility];  Service: General;  Laterality: N/A;   Patient Active Problem List   Diagnosis Date Noted   Arthritis of left hip 05/17/2023   Recurrent incisional hernia 03/01/2021   SBO (small bowel obstruction) (HCC) 01/15/2021   Essential hypertension 01/15/2021   Type 2 diabetes mellitus with obesity (HCC) 01/15/2021   Obesity, Class III, BMI 40-49.9 (morbid obesity) (HCC) 01/15/2021   Umbilical hernia with obstruction but no gangrene 02/11/2018   Partial small bowel obstruction (HCC) 09/13/2017   Gout, chronic 07/19/2015   OSA (obstructive sleep apnea) 07/19/2015   Obesity 06/29/2015   Elevated uric acid in blood 06/29/2015   Impaired fasting blood sugar 06/28/2015   Knee swelling 06/28/2015   Right knee pain 06/28/2015   Elevated blood pressure reading without diagnosis of hypertension 06/28/2015   Routine general medical examination at a health care facility 08/21/2011    PCP: Dois Davenport, MD   REFERRING PROVIDER: Gean Birchwood, MD  REFERRING DIAG: S.P LEFT HIP REPLACEMENT . PLEASE SCHEDULE PT APPOINTMERNT WITHIN 48 -72 HOURS OF SURGERY SURGERY DATE IS 1.27..2025  THERAPY DIAG:  Pain in left hip  Muscle weakness (generalized)  Rationale for Evaluation and Treatment: Rehabilitation  ONSET DATE: DOS 05/21/23  SUBJECTIVE:   SUBJECTIVE STATEMENT: Patient reports that his left hip feels fine today but that his Rt hip is more  painful, rates at a 6/10 today.  PERTINENT HISTORY: 48 yo male presents to therapy s/p L THA, anterior approach on 05/21/2023 due to failure of conservative measures. Pt PMH includes but is not limited to: hernia, SBO, HTN, DM II, gout, OSA, anxiety, and arthritis. PAIN:  Are you having pain? Yes: NPRS scale: 3-8/10 Pain location: L hip  Pain description: sore/stiff/ache Aggravating factors: L hip flexion to get in a bathtub/car  Relieving factors: rest  PRECAUTIONS: Anterior hip  RED FLAGS: None   WEIGHT BEARING  RESTRICTIONS: No  FALLS:  Has patient fallen in last 6 months? No  LIVING ENVIRONMENT: Lives with: lives with their spouse Lives in: House/apartment Stairs:  yes  Has following equipment at home: Single point cane  OCCUPATION: not working  PLOF: Independent  PATIENT GOALS: To regain my hip function  NEXT MD VISIT: TBD  OBJECTIVE:  Note: Objective measures were completed at Evaluation unless otherwise noted.  DIAGNOSTIC FINDINGS: none  PATIENT SURVEYS:  LEFS 17/80 21% functional  MUSCLE LENGTH: deferred  POSTURE:  flexed hip posture  LOWER EXTREMITY ROM:  Passive ROM Right eval Left eval  Hip flexion  85d  Hip extension    Hip abduction    Hip adduction    Hip internal rotation    Hip external rotation    Knee flexion  80d  Knee extension    Ankle dorsiflexion    Ankle plantarflexion    Ankle inversion    Ankle eversion     (Blank rows = not tested)  LOWER EXTREMITY MMT:  MMT Right eval Left eval  Hip flexion  4-  Hip extension  4-  Hip abduction  3  Hip adduction    Hip internal rotation    Hip external rotation    Knee flexion  4-  Knee extension  4-  Ankle dorsiflexion    Ankle plantarflexion  4-  Ankle inversion    Ankle eversion     (Blank rows = not tested)  LOWER EXTREMITY SPECIAL TESTS:  Deferred post-op  FUNCTIONAL TESTS:  30 seconds chair stand test 7 reps with UE Support  GAIT: Distance walked: 40ft x2 Assistive device utilized: Single point cane Level of assistance: Complete Independence Comments: slow cadence, antalgic gait, decreased step length                                                                                                                                TREATMENT DATE:  [facility] Adult PT Treatment:                                                DATE: 06/20/23 Therapeutic Exercise: Heel slides with strap 15x L hip flexor stretch 45s x2  Nustep (arms 8 seat 12) 8 min L4  Therapeutic Activity: Supine hip  fallouts BlueTB 15x B, 15x Lt only today Bridge against BlueTB 2x10 L clams in R S/L 15x Supine march L only 10x BlueTB STS lower table, UE use x10   [facility] Adult PT Treatment:                                                DATE: 06/13/23 Therapeutic Exercise: Heel slides with strap 15x L hip flexor stretch 30s x2  Nustep (arms 8 seat 12) 6 min L4  Therapeutic Activity: Supine hip fallouts BlueTB 10x B, 10x L/R Bridge against BlueTB 2x10 Bridge with ball squeeze 10x L clams in R S/L 15x Supine march L only 10x STS arms crossed x10 from raised table   [facility] Adult PT Treatment:                                                DATE: 06/11/23 Therapeutic Exercise: Heel slides with strap 15x L hip flexor stretch 30s x2  Nustep (arms 8 seat 12) 6 min L4  Therapeutic Activity: Supine hip fallouts GTB 10x B, 10x L only due to R hip pain Bridge against GTB 10x Bridge with ball squeeze 10x L clams in R S/L 10x Supine march L only 10x    PATIENT EDUCATION:  Education details: Discussed eval findings, rehab rationale and POC and patient is in agreement  Person educated: Patient Education method: Explanation Education comprehension: verbalized understanding and needs further education  HOME EXERCISE PROGRAM: Access Code: [email or web address] URL: [email or web address] Date: 06/06/2023 Prepared by: Gustavus Bryant  Exercises - Standing Heel Raise with Support  - 2-3 x daily - 5 x weekly - 1 sets - 15 reps - Seated Table Hamstring Stretch  - 2-3 x daily - 5 x weekly - 1 sets - 2 reps - 30s hold - Supine Heel Slide with Strap  - 2-3 x daily - 5 x weekly - 1 sets - 15 reps - Modified Thomas Stretch  - 2-3 x daily - 5 x weekly - 1 sets - 2 reps - 30s hold  ASSESSMENT:  CLINICAL IMPRESSION:  Patient presents to PT reporting no pain in the Lt side and increased pain in his Rt hip. He is looking forward to the THA on the Rt since it is hurting more now. Session today focused on Lt hip  mobility and strengthening, while not further aggravating the Rt hip. Patient continues to benefit from skilled PT services and should be progressed as able to improve functional independence.   (Eval) Patient is a 48 y.o. male who was seen today for physical therapy evaluation and treatment for L hip weakness, decreased ROM and discomfort following L THA anterior approach.  Patient presents with decreased ROM in L hip and knee due to soft tissue trauma and swelling.  Bed mobility and transfers functional.  Strength deficits noted with 30s chair stand test.  OBJECTIVE IMPAIRMENTS: Abnormal gait, decreased activity tolerance, decreased balance, decreased coordination, decreased mobility, difficulty walking, decreased ROM, decreased strength, impaired perceived functional ability, improper body mechanics, postural dysfunction, and pain.   ACTIVITY LIMITATIONS: carrying, lifting, bending, sitting, standing, squatting, stairs, and bed mobility  REHAB POTENTIAL: Good  CLINICAL DECISION MAKING: Stable/uncomplicated  EVALUATION COMPLEXITY: Low   GOALS: Goals  reviewed with patient? No  SHORT TERM GOALS: Target date: 06/25/2023   Patient to demonstrate independence in HEP  Baseline: TBD Goal status: INITIAL  2.  Assess 2 MWT and establish baseline Baseline: TBD Goal status: INITIAL   LONG TERM GOALS: Target date: 07/16/2023    Patient will increase 30s chair stand reps from 7 to 10 with/without arms to demonstrate and improved functional ability with less pain/difficulty as well as reduce fall risk.  Baseline: 7 reps with UE support Goal status: INITIAL  2.  Patient will score at least 40% on LEFS to signify clinically meaningful improvement in functional abilities.   Baseline: 21% Goal status: INITIAL  3.  Patient will acknowledge 4/10 pain at least once during episode of care   Baseline: 8/10 Goal status: INITIAL  4.  Re-assess 2 MWT Baseline: TBD Goal status: INITIAL  5.   Increase AROM L hip/knee to 90d Baseline:  Passive ROM Right eval Left eval  Hip flexion  85d  Hip extension    Hip abduction    Hip adduction    Hip internal rotation    Hip external rotation    Knee flexion  80d   Goal status: INITIAL  6.  Increase L LE strength to 4/5 Baseline:  MMT Right eval Left eval  Hip flexion  4-  Hip extension  4-  Hip abduction  3  Hip adduction    Hip internal rotation    Hip external rotation    Knee flexion  4-  Knee extension  4-  Ankle dorsiflexion    Ankle plantarflexion  4-   Goal status: INITIAL  PLAN:  PT FREQUENCY: 1-2x/week  PT DURATION: 6 weeks  PLANNED INTERVENTIONS: 97164- PT Re-evaluation, 97110-Therapeutic exercises, 97530- Therapeutic activity, 97112- Neuromuscular re-education, 97535- Self Care, 16109- Manual therapy, [phone]- Gait training, Balance training, and Stair training  PLAN FOR NEXT SESSION: HEP review and update, manual techniques as appropriate, aerobic tasks, ROM and flexibility activities, strengthening and PREs, TPDN, gait and balance training as needed    For all possible CPT codes, reference the Planned Interventions line above.     Check all conditions that are expected to impact treatment: {Conditions expected to impact treatment:Morbid obesity, Respiratory disorders, and Complications related to surgery   If treatment provided at initial evaluation, no treatment charged due to lack of authorization.       Berta Minor, PTA 06/20/2023, 11:29 AM

## 2023-06-20 NOTE — Therapy (Unsigned)
 OUTPATIENT PHYSICAL THERAPY TREATMENT NOTE   Patient Name: Frank Howe MRN: [id number] DOB:1976-01-27, 48 y.o., male Today's Date: 06/22/2023  END OF SESSION:  PT End of Session - 06/22/23 1001     Visit Number 6    Number of Visits 12    Date for PT Re-Evaluation 08/02/23    Authorization Type MCD    Authorization Time Period 14 visits approved 06/05/23-09/03/23    Authorization - Number of Visits 14    PT Start Time 1000    PT Stop Time 1040    PT Time Calculation (min) 40 min    Activity Tolerance Patient tolerated treatment well;Patient limited by pain    Behavior During Therapy [facility] for tasks assessed/performed             Past Medical History:  Diagnosis Date   Anxiety    Arthritis    Complication of anesthesia    Fingers numb after surgery   Depression    Diabetes mellitus type 2 in obese 01/15/2021   Elevated blood pressure reading without diagnosis of hypertension    Gout    History of kidney stones    Hypertension    Impaired fasting blood sugar    Obesity    OSA (obstructive sleep apnea)    Past Surgical History:  Procedure Laterality Date   TOTAL HIP ARTHROPLASTY Left 05/21/2023   Procedure: LEFT TOTAL HIP ARTHROPLASTY ANTERIOR APPROACH;  Surgeon: Gean Birchwood, MD;  Location: [facility];  Service: Orthopedics;  Laterality: Left;   UMBILICAL HERNIA REPAIR N/A 09/14/2017   Procedure: LAPAROSCOPIC UMBILICAL HERNIA;  Surgeon: Romie Levee, MD;  Location: [facility];  Service: General;  Laterality: N/A;   UMBILICAL HERNIA REPAIR N/A 02/11/2018   Procedure: LAPARASCOPIC REDO REPAIR OF RECURRENT UMBILICAL HERNIA WITH INSERTION OF MESH, LYSIS OF ADHESIONS;  Surgeon: Andria Meuse, MD;  Location: [facility];  Service: General;  Laterality: N/A;   XI ROBOTIC ASSISTED VENTRAL HERNIA N/A 03/01/2021   Procedure: ROBOTIC RECURRENT INCISIONAL HERNIA 9CMx9CM REPAIR WITH MESH AND REMOVAL OF INTRAPERITONEAL MESH;  Surgeon: Quentin Ore, MD;  Location: [facility];   Service: General;  Laterality: N/A;   Patient Active Problem List   Diagnosis Date Noted   Arthritis of left hip 05/17/2023   Recurrent incisional hernia 03/01/2021   SBO (small bowel obstruction) (HCC) 01/15/2021   Essential hypertension 01/15/2021   Type 2 diabetes mellitus with obesity (HCC) 01/15/2021   Obesity, Class III, BMI 40-49.9 (morbid obesity) (HCC) 01/15/2021   Umbilical hernia with obstruction but no gangrene 02/11/2018   Partial small bowel obstruction (HCC) 09/13/2017   Gout, chronic 07/19/2015   OSA (obstructive sleep apnea) 07/19/2015   Obesity 06/29/2015   Elevated uric acid in blood 06/29/2015   Impaired fasting blood sugar 06/28/2015   Knee swelling 06/28/2015   Right knee pain 06/28/2015   Elevated blood pressure reading without diagnosis of hypertension 06/28/2015   Routine general medical examination at a health care facility 08/21/2011    PCP: Dois Davenport, MD   REFERRING PROVIDER: Gean Birchwood, MD  REFERRING DIAG: S.P LEFT HIP REPLACEMENT . PLEASE SCHEDULE PT APPOINTMERNT WITHIN 48 -72 HOURS OF SURGERY SURGERY DATE IS 1.27..2025  THERAPY DIAG:  Pain in left hip  Muscle weakness (generalized)  Other abnormalities of gait and mobility  Rationale for Evaluation and Treatment: Rehabilitation  ONSET DATE: DOS 05/21/23  SUBJECTIVE:   SUBJECTIVE STATEMENT: L hip feeling much better, rates function at 80%.  PERTINENT HISTORY: 48 yo male presents  to therapy s/p L THA, anterior approach on 05/21/2023 due to failure of conservative measures. Pt PMH includes but is not limited to: hernia, SBO, HTN, DM II, gout, OSA, anxiety, and arthritis. PAIN:  Are you having pain? Yes: NPRS scale: 3-8/10 Pain location: L hip  Pain description: sore/stiff/ache Aggravating factors: L hip flexion to get in a bathtub/car  Relieving factors: rest  PRECAUTIONS: Anterior hip  RED FLAGS: None   WEIGHT BEARING RESTRICTIONS: No  FALLS:  Has patient fallen in  last 6 months? No  LIVING ENVIRONMENT: Lives with: lives with their spouse Lives in: House/apartment Stairs:  yes  Has following equipment at home: Single point cane  OCCUPATION: not working  PLOF: Independent  PATIENT GOALS: To regain my hip function  NEXT MD VISIT: TBD  OBJECTIVE:  Note: Objective measures were completed at Evaluation unless otherwise noted.  DIAGNOSTIC FINDINGS: none  PATIENT SURVEYS:  LEFS 17/80 21% functional  MUSCLE LENGTH: deferred  POSTURE:  flexed hip posture  LOWER EXTREMITY ROM:  Passive ROM Right eval Left eval  Hip flexion  85d  Hip extension    Hip abduction    Hip adduction    Hip internal rotation    Hip external rotation    Knee flexion  80d  Knee extension    Ankle dorsiflexion    Ankle plantarflexion    Ankle inversion    Ankle eversion     (Blank rows = not tested)  LOWER EXTREMITY MMT:  MMT Right eval Left eval  Hip flexion  4-  Hip extension  4-  Hip abduction  3  Hip adduction    Hip internal rotation    Hip external rotation    Knee flexion  4-  Knee extension  4-  Ankle dorsiflexion    Ankle plantarflexion  4-  Ankle inversion    Ankle eversion     (Blank rows = not tested)  LOWER EXTREMITY SPECIAL TESTS:  Deferred post-op  FUNCTIONAL TESTS:  30 seconds chair stand test 7 reps with UE Support  GAIT: Distance walked: 67ft x2 Assistive device utilized: Single point cane Level of assistance: Complete Independence Comments: slow cadence, antalgic gait, decreased step length                                                                                                                                TREATMENT DATE:  [facility] Adult PT Treatment:                                                DATE: 06/22/23 Therapeutic Exercise: L hip flexor stretch (RLE on ball) 30s x2 Nustep (arms 8 seat 10) 8 min L6  Therapeutic Activity: FAQs L 2# 15x 2s hold DKTC over ball 2s hold 15x Supine hip fallouts  BlueTB 15x B,  15x Lt only today Bridge against BlueTB 2x10 Bridge with ball 15x Runners step L 10x 4 in  [facility] Adult PT Treatment:                                                DATE: 06/20/23 Therapeutic Exercise: Heel slides with strap 15x L hip flexor stretch 45s x2  Nustep (arms 8 seat 12) 8 min L4  Therapeutic Activity: Supine hip fallouts BlueTB 15x B, 15x Lt only today Bridge against BlueTB 2x10 L clams in R S/L 15x Supine march L only 10x BlueTB STS lower table, UE use x10   [facility] Adult PT Treatment:                                                DATE: 06/13/23 Therapeutic Exercise: Heel slides with strap 15x L hip flexor stretch 30s x2  Nustep (arms 8 seat 12) 6 min L4  Therapeutic Activity: Supine hip fallouts BlueTB 10x B, 10x L/R Bridge against BlueTB 2x10 Bridge with ball squeeze 10x L clams in R S/L 15x Supine march L only 10x STS arms crossed x10 from raised table   [facility] Adult PT Treatment:                                                DATE: 06/11/23 Therapeutic Exercise: Heel slides with strap 15x L hip flexor stretch 30s x2  Nustep (arms 8 seat 12) 6 min L4  Therapeutic Activity: Supine hip fallouts GTB 10x B, 10x L only due to R hip pain Bridge against GTB 10x Bridge with ball squeeze 10x L clams in R S/L 10x Supine march L only 10x    PATIENT EDUCATION:  Education details: Discussed eval findings, rehab rationale and POC and patient is in agreement  Person educated: Patient Education method: Explanation Education comprehension: verbalized understanding and needs further education  HOME EXERCISE PROGRAM: Access Code: [email or web address] URL: [email or web address] Date: 06/06/2023 Prepared by: Gustavus Bryant  Exercises - Standing Heel Raise with Support  - 2-3 x daily - 5 x weekly - 1 sets - 15 reps - Seated Table Hamstring Stretch  - 2-3 x daily - 5 x weekly - 1 sets - 2 reps - 30s hold - Supine Heel Slide with Strap  - 2-3 x daily - 5 x  weekly - 1 sets - 15 reps - Modified Thomas Stretch  - 2-3 x daily - 5 x weekly - 1 sets - 2 reps - 30s hold  ASSESSMENT:  CLINICAL IMPRESSION: L hip progressing well, swelling and stiffness resolving.  Increased resistance and difficulty as noted, able to tolerate more L hip flexion on Nustep as noted by seat position.  Able to advance to runners step to challenge strength and balance with a more functional task. Will derease frequency to 1x/week  (Eval) Patient is a 48 y.o. male who was seen today for physical therapy evaluation and treatment for L hip weakness, decreased ROM and discomfort following L THA anterior approach.  Patient presents with decreased ROM in L hip and knee due to soft tissue  trauma and swelling.  Bed mobility and transfers functional.  Strength deficits noted with 30s chair stand test.  OBJECTIVE IMPAIRMENTS: Abnormal gait, decreased activity tolerance, decreased balance, decreased coordination, decreased mobility, difficulty walking, decreased ROM, decreased strength, impaired perceived functional ability, improper body mechanics, postural dysfunction, and pain.   ACTIVITY LIMITATIONS: carrying, lifting, bending, sitting, standing, squatting, stairs, and bed mobility  REHAB POTENTIAL: Good  CLINICAL DECISION MAKING: Stable/uncomplicated  EVALUATION COMPLEXITY: Low   GOALS: Goals reviewed with patient? No  SHORT TERM GOALS: Target date: 06/25/2023   Patient to demonstrate independence in HEP  Baseline: TBD Goal status: INITIAL  2.  Assess 2 MWT and establish baseline Baseline: TBD Goal status: INITIAL   LONG TERM GOALS: Target date: 07/16/2023    Patient will increase 30s chair stand reps from 7 to 10 with/without arms to demonstrate and improved functional ability with less pain/difficulty as well as reduce fall risk.  Baseline: 7 reps with UE support Goal status: INITIAL  2.  Patient will score at least 40% on LEFS to signify clinically meaningful  improvement in functional abilities.   Baseline: 21% Goal status: INITIAL  3.  Patient will acknowledge 4/10 pain at least once during episode of care   Baseline: 8/10 Goal status: INITIAL  4.  Re-assess 2 MWT Baseline: TBD Goal status: INITIAL  5.  Increase AROM L hip/knee to 90d Baseline:  Passive ROM Right eval Left eval  Hip flexion  85d  Hip extension    Hip abduction    Hip adduction    Hip internal rotation    Hip external rotation    Knee flexion  80d   Goal status: INITIAL  6.  Increase L LE strength to 4/5 Baseline:  MMT Right eval Left eval  Hip flexion  4-  Hip extension  4-  Hip abduction  3  Hip adduction    Hip internal rotation    Hip external rotation    Knee flexion  4-  Knee extension  4-  Ankle dorsiflexion    Ankle plantarflexion  4-   Goal status: INITIAL  PLAN:  PT FREQUENCY: 1-2x/week  PT DURATION: 6 weeks  PLANNED INTERVENTIONS: 97164- PT Re-evaluation, 97110-Therapeutic exercises, 97530- Therapeutic activity, 97112- Neuromuscular re-education, 97535- Self Care, 40981- Manual therapy, [phone]- Gait training, Balance training, and Stair training  PLAN FOR NEXT SESSION: HEP review and update, manual techniques as appropriate, aerobic tasks, ROM and flexibility activities, strengthening and PREs, TPDN, gait and balance training as needed    For all possible CPT codes, reference the Planned Interventions line above.     Check all conditions that are expected to impact treatment: {Conditions expected to impact treatment:Morbid obesity, Respiratory disorders, and Complications related to surgery   If treatment provided at initial evaluation, no treatment charged due to lack of authorization.       Hildred Laser, PT 06/22/2023, 10:45 AM

## 2023-06-22 ENCOUNTER — Ambulatory Visit: Payer: Medicaid Other

## 2023-06-22 DIAGNOSIS — M25552 Pain in left hip: Secondary | ICD-10-CM | POA: Diagnosis not present

## 2023-06-22 DIAGNOSIS — R2689 Other abnormalities of gait and mobility: Secondary | ICD-10-CM

## 2023-06-22 DIAGNOSIS — M6281 Muscle weakness (generalized): Secondary | ICD-10-CM

## 2023-06-26 NOTE — Therapy (Unsigned)
 OUTPATIENT PHYSICAL THERAPY TREATMENT NOTE   Patient Name: Frank Howe MRN: [id number] DOB:December 17, 1975, 48 y.o., male Today's Date: 06/27/2023  END OF SESSION:  PT End of Session - 06/27/23 1052     Visit Number 7    Number of Visits 12    Date for PT Re-Evaluation 08/02/23    Authorization Type MCD    Authorization Time Period 14 visits approved 06/05/23-09/03/23    Authorization - Visit Number 7    Authorization - Number of Visits 14    PT Start Time 1045    PT Stop Time 1125    PT Time Calculation (min) 40 min    Activity Tolerance Patient tolerated treatment well;Patient limited by pain    Behavior During Therapy [facility] for tasks assessed/performed              Past Medical History:  Diagnosis Date   Anxiety    Arthritis    Complication of anesthesia    Fingers numb after surgery   Depression    Diabetes mellitus type 2 in obese 01/15/2021   Elevated blood pressure reading without diagnosis of hypertension    Gout    History of kidney stones    Hypertension    Impaired fasting blood sugar    Obesity    OSA (obstructive sleep apnea)    Past Surgical History:  Procedure Laterality Date   TOTAL HIP ARTHROPLASTY Left 05/21/2023   Procedure: LEFT TOTAL HIP ARTHROPLASTY ANTERIOR APPROACH;  Surgeon: Gean Birchwood, MD;  Location: [facility];  Service: Orthopedics;  Laterality: Left;   UMBILICAL HERNIA REPAIR N/A 09/14/2017   Procedure: LAPAROSCOPIC UMBILICAL HERNIA;  Surgeon: Romie Levee, MD;  Location: [facility];  Service: General;  Laterality: N/A;   UMBILICAL HERNIA REPAIR N/A 02/11/2018   Procedure: LAPARASCOPIC REDO REPAIR OF RECURRENT UMBILICAL HERNIA WITH INSERTION OF MESH, LYSIS OF ADHESIONS;  Surgeon: Andria Meuse, MD;  Location: [facility];  Service: General;  Laterality: N/A;   XI ROBOTIC ASSISTED VENTRAL HERNIA N/A 03/01/2021   Procedure: ROBOTIC RECURRENT INCISIONAL HERNIA 9CMx9CM REPAIR WITH MESH AND REMOVAL OF INTRAPERITONEAL MESH;  Surgeon: Quentin Ore, MD;  Location: [facility];  Service: General;  Laterality: N/A;   Patient Active Problem List   Diagnosis Date Noted   Arthritis of left hip 05/17/2023   Recurrent incisional hernia 03/01/2021   SBO (small bowel obstruction) (HCC) 01/15/2021   Essential hypertension 01/15/2021   Type 2 diabetes mellitus with obesity (HCC) 01/15/2021   Obesity, Class III, BMI 40-49.9 (morbid obesity) (HCC) 01/15/2021   Umbilical hernia with obstruction but no gangrene 02/11/2018   Partial small bowel obstruction (HCC) 09/13/2017   Gout, chronic 07/19/2015   OSA (obstructive sleep apnea) 07/19/2015   Obesity 06/29/2015   Elevated uric acid in blood 06/29/2015   Impaired fasting blood sugar 06/28/2015   Knee swelling 06/28/2015   Right knee pain 06/28/2015   Elevated blood pressure reading without diagnosis of hypertension 06/28/2015   Routine general medical examination at a health care facility 08/21/2011    PCP: Dois Davenport, MD   REFERRING PROVIDER: Gean Birchwood, MD  REFERRING DIAG: S.P LEFT HIP REPLACEMENT . PLEASE SCHEDULE PT APPOINTMERNT WITHIN 48 -72 HOURS OF SURGERY SURGERY DATE IS 1.27..2025  THERAPY DIAG:  Pain in left hip  Muscle weakness (generalized)  Other abnormalities of gait and mobility  Rationale for Evaluation and Treatment: Rehabilitation  ONSET DATE: DOS 05/21/23  SUBJECTIVE:   SUBJECTIVE STATEMENT: L hip symptoms minimal, R hip remains  problematic.  Trying to expedite R THA due to insurance limitations.  PERTINENT HISTORY: 48 yo male presents to therapy s/p L THA, anterior approach on 05/21/2023 due to failure of conservative measures. Pt PMH includes but is not limited to: hernia, SBO, HTN, DM II, gout, OSA, anxiety, and arthritis. PAIN:  Are you having pain? Yes: NPRS scale: 3-8/10 Pain location: L hip  Pain description: sore/stiff/ache Aggravating factors: L hip flexion to get in a bathtub/car  Relieving factors: rest  PRECAUTIONS: Anterior  hip  RED FLAGS: None   WEIGHT BEARING RESTRICTIONS: No  FALLS:  Has patient fallen in last 6 months? No  LIVING ENVIRONMENT: Lives with: lives with their spouse Lives in: House/apartment Stairs:  yes  Has following equipment at home: Single point cane  OCCUPATION: not working  PLOF: Independent  PATIENT GOALS: To regain my hip function  NEXT MD VISIT: TBD  OBJECTIVE:  Note: Objective measures were completed at Evaluation unless otherwise noted.  DIAGNOSTIC FINDINGS: none  PATIENT SURVEYS:  LEFS 17/80 21% functional  MUSCLE LENGTH: deferred  POSTURE:  flexed hip posture  LOWER EXTREMITY ROM:  Passive ROM Right eval Left eval  Hip flexion  85d  Hip extension    Hip abduction    Hip adduction    Hip internal rotation    Hip external rotation    Knee flexion  80d  Knee extension    Ankle dorsiflexion    Ankle plantarflexion    Ankle inversion    Ankle eversion     (Blank rows = not tested)  LOWER EXTREMITY MMT:  MMT Right eval Left eval  Hip flexion  4-  Hip extension  4-  Hip abduction  3  Hip adduction    Hip internal rotation    Hip external rotation    Knee flexion  4-  Knee extension  4-  Ankle dorsiflexion    Ankle plantarflexion  4-  Ankle inversion    Ankle eversion     (Blank rows = not tested)  LOWER EXTREMITY SPECIAL TESTS:  Deferred post-op  FUNCTIONAL TESTS:  30 seconds chair stand test 7 reps with UE Support  GAIT: Distance walked: 19ft x2 Assistive device utilized: Single point cane Level of assistance: Complete Independence Comments: slow cadence, antalgic gait, decreased step length                                                                                                                                TREATMENT DATE:  [facility] Adult PT Treatment:                                                DATE: 06/27/23 Therapeutic Exercise: L hip flexor stretch (RLE on ball) 30s x2 Nustep (arms 8 seat 9) 8 min  L6 Neuromuscular re-ed: Supine  hip fallouts BluTB 15x B 15/15 unilateral Bridge with ball 15x Runners step L 15x 6 in 10# KB FAQ with adduction 15x Therapeutic Activity:  Step ups 8 in 15x L  Lateral steps 8 in L 15x  STS from Airex arms extended 10x     [facility] Adult PT Treatment:                                                DATE: 06/22/23 Therapeutic Exercise: L hip flexor stretch (RLE on ball) 30s x2 Nustep (arms 8 seat 10) 8 min L6  Therapeutic Activity: FAQs L 2# 15x 2s hold DKTC over ball 2s hold 15x Supine hip fallouts BlueTB 15x B, 15x Lt only today Bridge against BlueTB 2x10 Bridge with ball 15x Runners step L 10x 4 in  [facility] Adult PT Treatment:                                                DATE: 06/20/23 Therapeutic Exercise: Heel slides with strap 15x L hip flexor stretch 45s x2  Nustep (arms 8 seat 12) 8 min L4  Therapeutic Activity: Supine hip fallouts BlueTB 15x B, 15x Lt only today Bridge against BlueTB 2x10 L clams in R S/L 15x Supine march L only 10x BlueTB STS lower table, UE use x10   [facility] Adult PT Treatment:                                                DATE: 06/13/23 Therapeutic Exercise: Heel slides with strap 15x L hip flexor stretch 30s x2  Nustep (arms 8 seat 12) 6 min L4  Therapeutic Activity: Supine hip fallouts BlueTB 10x B, 10x L/R Bridge against BlueTB 2x10 Bridge with ball squeeze 10x L clams in R S/L 15x Supine march L only 10x STS arms crossed x10 from raised table   [facility] Adult PT Treatment:                                                DATE: 06/11/23 Therapeutic Exercise: Heel slides with strap 15x L hip flexor stretch 30s x2  Nustep (arms 8 seat 12) 6 min L4  Therapeutic Activity: Supine hip fallouts GTB 10x B, 10x L only due to R hip pain Bridge against GTB 10x Bridge with ball squeeze 10x L clams in R S/L 10x Supine march L only 10x    PATIENT EDUCATION:  Education details: Discussed eval findings, rehab rationale  and POC and patient is in agreement  Person educated: Patient Education method: Explanation Education comprehension: verbalized understanding and needs further education  HOME EXERCISE PROGRAM: Access Code: [email or web address] URL: [email or web address] Date: 06/06/2023 Prepared by: Gustavus Bryant  Exercises - Standing Heel Raise with Support  - 2-3 x daily - 5 x weekly - 1 sets - 15 reps - Seated Table Hamstring Stretch  - 2-3 x daily - 5 x weekly - 1 sets - 2 reps - 30s hold - Supine  Heel Slide with Strap  - 2-3 x daily - 5 x weekly - 1 sets - 15 reps - Modified Thomas Stretch  - 2-3 x daily - 5 x weekly - 1 sets - 2 reps - 30s hold  ASSESSMENT:  CLINICAL IMPRESSION: Continued functional strengthening and stretching to L hip.  Increased difficulty and reps as noted.  Seat moved up on Nustep to facilitate flexion.  Increased challenge on stepping tasks.  (Eval) Patient is a 48 y.o. male who was seen today for physical therapy evaluation and treatment for L hip weakness, decreased ROM and discomfort following L THA anterior approach.  Patient presents with decreased ROM in L hip and knee due to soft tissue trauma and swelling.  Bed mobility and transfers functional.  Strength deficits noted with 30s chair stand test.  OBJECTIVE IMPAIRMENTS: Abnormal gait, decreased activity tolerance, decreased balance, decreased coordination, decreased mobility, difficulty walking, decreased ROM, decreased strength, impaired perceived functional ability, improper body mechanics, postural dysfunction, and pain.   ACTIVITY LIMITATIONS: carrying, lifting, bending, sitting, standing, squatting, stairs, and bed mobility  REHAB POTENTIAL: Good  CLINICAL DECISION MAKING: Stable/uncomplicated  EVALUATION COMPLEXITY: Low   GOALS: Goals reviewed with patient? No  SHORT TERM GOALS: Target date: 06/25/2023   Patient to demonstrate independence in HEP  Baseline: TBD Goal status: INITIAL  2.  Assess 2  MWT and establish baseline Baseline: TBD Goal status: INITIAL   LONG TERM GOALS: Target date: 07/16/2023    Patient will increase 30s chair stand reps from 7 to 10 with/without arms to demonstrate and improved functional ability with less pain/difficulty as well as reduce fall risk.  Baseline: 7 reps with UE support Goal status: INITIAL  2.  Patient will score at least 40% on LEFS to signify clinically meaningful improvement in functional abilities.   Baseline: 21% Goal status: INITIAL  3.  Patient will acknowledge 4/10 pain at least once during episode of care   Baseline: 8/10 Goal status: INITIAL  4.  Re-assess 2 MWT Baseline: TBD Goal status: INITIAL  5.  Increase AROM L hip/knee to 90d Baseline:  Passive ROM Right eval Left eval  Hip flexion  85d  Hip extension    Hip abduction    Hip adduction    Hip internal rotation    Hip external rotation    Knee flexion  80d   Goal status: INITIAL  6.  Increase L LE strength to 4/5 Baseline:  MMT Right eval Left eval  Hip flexion  4-  Hip extension  4-  Hip abduction  3  Hip adduction    Hip internal rotation    Hip external rotation    Knee flexion  4-  Knee extension  4-  Ankle dorsiflexion    Ankle plantarflexion  4-   Goal status: INITIAL  PLAN:  PT FREQUENCY: 1-2x/week  PT DURATION: 6 weeks  PLANNED INTERVENTIONS: 97164- PT Re-evaluation, 97110-Therapeutic exercises, 97530- Therapeutic activity, 97112- Neuromuscular re-education, 97535- Self Care, 91478- Manual therapy, [phone]- Gait training, Balance training, and Stair training  PLAN FOR NEXT SESSION: HEP review and update, manual techniques as appropriate, aerobic tasks, ROM and flexibility activities, strengthening and PREs, TPDN, gait and balance training as needed    For all possible CPT codes, reference the Planned Interventions line above.     Check all conditions that are expected to impact treatment: {Conditions expected to impact  treatment:Morbid obesity, Respiratory disorders, and Complications related to surgery   If treatment provided at initial evaluation, no  treatment charged due to lack of authorization.       Hildred Laser, PT 06/27/2023, 11:57 AM

## 2023-06-27 ENCOUNTER — Ambulatory Visit: Attending: Orthopedic Surgery

## 2023-06-27 DIAGNOSIS — R2689 Other abnormalities of gait and mobility: Secondary | ICD-10-CM | POA: Diagnosis present

## 2023-06-27 DIAGNOSIS — M25552 Pain in left hip: Secondary | ICD-10-CM

## 2023-06-27 DIAGNOSIS — M6281 Muscle weakness (generalized): Secondary | ICD-10-CM

## 2023-07-04 ENCOUNTER — Ambulatory Visit

## 2023-07-04 DIAGNOSIS — M6281 Muscle weakness (generalized): Secondary | ICD-10-CM

## 2023-07-04 DIAGNOSIS — M25552 Pain in left hip: Secondary | ICD-10-CM

## 2023-07-04 DIAGNOSIS — R2689 Other abnormalities of gait and mobility: Secondary | ICD-10-CM

## 2023-07-04 NOTE — Therapy (Signed)
 OUTPATIENT PHYSICAL THERAPY TREATMENT NOTE   Patient Name: Frank Howe MRN: [id number] DOB:January 26, 1976, 48 y.o., male Today's Date: 07/04/2023  END OF SESSION:  PT End of Session - 07/04/23 1354     Visit Number 8    Number of Visits 12    Date for PT Re-Evaluation 08/02/23    Authorization Type MCD    Authorization Time Period 14 visits approved 06/05/23-09/03/23    Authorization - Visit Number 8    Authorization - Number of Visits 14    PT Start Time 1400    PT Stop Time 1440    PT Time Calculation (min) 40 min    Activity Tolerance Patient tolerated treatment well;Patient limited by pain    Behavior During Therapy [facility] for tasks assessed/performed             Past Medical History:  Diagnosis Date   Anxiety    Arthritis    Complication of anesthesia    Fingers numb after surgery   Depression    Diabetes mellitus type 2 in obese 01/15/2021   Elevated blood pressure reading without diagnosis of hypertension    Gout    History of kidney stones    Hypertension    Impaired fasting blood sugar    Obesity    OSA (obstructive sleep apnea)    Past Surgical History:  Procedure Laterality Date   TOTAL HIP ARTHROPLASTY Left 05/21/2023   Procedure: LEFT TOTAL HIP ARTHROPLASTY ANTERIOR APPROACH;  Surgeon: Gean Birchwood, MD;  Location: [facility];  Service: Orthopedics;  Laterality: Left;   UMBILICAL HERNIA REPAIR N/A 09/14/2017   Procedure: LAPAROSCOPIC UMBILICAL HERNIA;  Surgeon: Romie Levee, MD;  Location: [facility];  Service: General;  Laterality: N/A;   UMBILICAL HERNIA REPAIR N/A 02/11/2018   Procedure: LAPARASCOPIC REDO REPAIR OF RECURRENT UMBILICAL HERNIA WITH INSERTION OF MESH, LYSIS OF ADHESIONS;  Surgeon: Andria Meuse, MD;  Location: [facility];  Service: General;  Laterality: N/A;   XI ROBOTIC ASSISTED VENTRAL HERNIA N/A 03/01/2021   Procedure: ROBOTIC RECURRENT INCISIONAL HERNIA 9CMx9CM REPAIR WITH MESH AND REMOVAL OF INTRAPERITONEAL MESH;  Surgeon: Quentin Ore, MD;  Location: [facility];  Service: General;  Laterality: N/A;   Patient Active Problem List   Diagnosis Date Noted   Arthritis of left hip 05/17/2023   Recurrent incisional hernia 03/01/2021   SBO (small bowel obstruction) (HCC) 01/15/2021   Essential hypertension 01/15/2021   Type 2 diabetes mellitus with obesity (HCC) 01/15/2021   Obesity, Class III, BMI 40-49.9 (morbid obesity) (HCC) 01/15/2021   Umbilical hernia with obstruction but no gangrene 02/11/2018   Partial small bowel obstruction (HCC) 09/13/2017   Gout, chronic 07/19/2015   OSA (obstructive sleep apnea) 07/19/2015   Obesity 06/29/2015   Elevated uric acid in blood 06/29/2015   Impaired fasting blood sugar 06/28/2015   Knee swelling 06/28/2015   Right knee pain 06/28/2015   Elevated blood pressure reading without diagnosis of hypertension 06/28/2015   Routine general medical examination at a health care facility 08/21/2011    PCP: Dois Davenport, MD   REFERRING PROVIDER: Gean Birchwood, MD  REFERRING DIAG: S.P LEFT HIP REPLACEMENT . PLEASE SCHEDULE PT APPOINTMERNT WITHIN 48 -72 HOURS OF SURGERY SURGERY DATE IS 1.27..2025  THERAPY DIAG:  Pain in left hip  Muscle weakness (generalized)  Other abnormalities of gait and mobility  Rationale for Evaluation and Treatment: Rehabilitation  ONSET DATE: DOS 05/21/23  SUBJECTIVE:   SUBJECTIVE STATEMENT: Patient reports that Rt hip continues to be  most painful, has been having trouble with insurance company regarding disability payments.   PERTINENT HISTORY: 48 yo male presents to therapy s/p L THA, anterior approach on 05/21/2023 due to failure of conservative measures. Pt PMH includes but is not limited to: hernia, SBO, HTN, DM II, gout, OSA, anxiety, and arthritis. PAIN:  Are you having pain? Yes: NPRS scale: 3-8/10 Pain location: L hip  Pain description: sore/stiff/ache Aggravating factors: L hip flexion to get in a bathtub/car  Relieving factors:  rest  PRECAUTIONS: Anterior hip  RED FLAGS: None   WEIGHT BEARING RESTRICTIONS: No  FALLS:  Has patient fallen in last 6 months? No  LIVING ENVIRONMENT: Lives with: lives with their spouse Lives in: House/apartment Stairs:  yes  Has following equipment at home: Single point cane  OCCUPATION: not working  PLOF: Independent  PATIENT GOALS: To regain my hip function  NEXT MD VISIT: TBD  OBJECTIVE:  Note: Objective measures were completed at Evaluation unless otherwise noted.  DIAGNOSTIC FINDINGS: none  PATIENT SURVEYS:  LEFS 17/80 21% functional  MUSCLE LENGTH: deferred  POSTURE:  flexed hip posture  LOWER EXTREMITY ROM:  Passive ROM Right eval Left eval  Hip flexion  85d  Hip extension    Hip abduction    Hip adduction    Hip internal rotation    Hip external rotation    Knee flexion  80d  Knee extension    Ankle dorsiflexion    Ankle plantarflexion    Ankle inversion    Ankle eversion     (Blank rows = not tested)  LOWER EXTREMITY MMT:  MMT Right eval Left eval  Hip flexion  4-  Hip extension  4-  Hip abduction  3  Hip adduction    Hip internal rotation    Hip external rotation    Knee flexion  4-  Knee extension  4-  Ankle dorsiflexion    Ankle plantarflexion  4-  Ankle inversion    Ankle eversion     (Blank rows = not tested)  LOWER EXTREMITY SPECIAL TESTS:  Deferred post-op  FUNCTIONAL TESTS:  30 seconds chair stand test 7 reps with UE Support  GAIT: Distance walked: 70ft x2 Assistive device utilized: Single point cane Level of assistance: Complete Independence Comments: slow cadence, antalgic gait, decreased step length                                                                                                                               TREATMENT DATE:  [facility] Adult PT Treatment:                                                DATE: 07/04/23 Therapeutic Exercise: Nustep (arms 8 seat 9) 8 min L6 L hip flexor stretch  x1' Neuromuscular re-ed: Supine hip fallouts BluTB  15x B 15/15 unilateral Bridge with ball 2x10 Runners step BIL 15x 8 in 10# KB (pain when stepping up with RLE) FAQ with adduction with ball 2x10 Therapeutic Activity: Step ups 8 in 15x L Lateral steps 8 in L 15x STS from Airex arms extended 10x  [facility] Adult PT Treatment:                                                DATE: 06/27/23 Therapeutic Exercise: L hip flexor stretch (RLE on ball) 30s x2 Nustep (arms 8 seat 9) 8 min L6 Neuromuscular re-ed: Supine hip fallouts BluTB 15x B 15/15 unilateral Bridge with ball 15x Runners step L 15x 6 in 10# KB FAQ with adduction 15x Therapeutic Activity:  Step ups 8 in 15x L  Lateral steps 8 in L 15x  STS from Airex arms extended 10x   [facility] Adult PT Treatment:                                                DATE: 06/22/23 Therapeutic Exercise: L hip flexor stretch (RLE on ball) 30s x2 Nustep (arms 8 seat 10) 8 min L6  Therapeutic Activity: FAQs L 2# 15x 2s hold DKTC over ball 2s hold 15x Supine hip fallouts BlueTB 15x B, 15x Lt only today Bridge against BlueTB 2x10 Bridge with ball 15x Runners step L 10x 4 in   PATIENT EDUCATION:  Education details: Discussed eval findings, rehab rationale and POC and patient is in agreement  Person educated: Patient Education method: Explanation Education comprehension: verbalized understanding and needs further education  HOME EXERCISE PROGRAM: Access Code: [email or web address] URL: [email or web address] Date: 06/06/2023 Prepared by: Gustavus Bryant  Exercises - Standing Heel Raise with Support  - 2-3 x daily - 5 x weekly - 1 sets - 15 reps - Seated Table Hamstring Stretch  - 2-3 x daily - 5 x weekly - 1 sets - 2 reps - 30s hold - Supine Heel Slide with Strap  - 2-3 x daily - 5 x weekly - 1 sets - 15 reps - Modified Thomas Stretch  - 2-3 x daily - 5 x weekly - 1 sets - 2 reps - 30s hold  ASSESSMENT:  CLINICAL IMPRESSION:  Patient presents  to PT reporting continued improvements in his Lt hip pain and that his Rt hip remains the most painful. He states he has an appointment on 08/08/23 to discuss moving forward with the R THA. Session today continued to focus on proximal hip strengthening and stretching. Patient was able to tolerate all prescribed exercises with no adverse effects. Patient continues to benefit from skilled PT services and should be progressed as able to improve functional independence.   (Eval) Patient is a 48 y.o. male who was seen today for physical therapy evaluation and treatment for L hip weakness, decreased ROM and discomfort following L THA anterior approach.  Patient presents with decreased ROM in L hip and knee due to soft tissue trauma and swelling.  Bed mobility and transfers functional.  Strength deficits noted with 30s chair stand test.  OBJECTIVE IMPAIRMENTS: Abnormal gait, decreased activity tolerance, decreased balance, decreased coordination, decreased mobility, difficulty walking, decreased ROM, decreased strength, impaired perceived functional ability, improper body mechanics, postural dysfunction,  and pain.   ACTIVITY LIMITATIONS: carrying, lifting, bending, sitting, standing, squatting, stairs, and bed mobility  REHAB POTENTIAL: Good  CLINICAL DECISION MAKING: Stable/uncomplicated  EVALUATION COMPLEXITY: Low   GOALS: Goals reviewed with patient? No  SHORT TERM GOALS: Target date: 06/25/2023   Patient to demonstrate independence in HEP  Baseline: TBD Goal status: Ongoing Pt reports non-compliance 07/04/23  2.  Assess 2 MWT and establish baseline Baseline: TBD Goal status: INITIAL   LONG TERM GOALS: Target date: 07/16/2023    Patient will increase 30s chair stand reps from 7 to 10 with/without arms to demonstrate and improved functional ability with less pain/difficulty as well as reduce fall risk.  Baseline: 7 reps with UE support Goal status: INITIAL  2.  Patient will score at least  40% on LEFS to signify clinically meaningful improvement in functional abilities.   Baseline: 21% Goal status: INITIAL  3.  Patient will acknowledge 4/10 pain at least once during episode of care   Baseline: 8/10 Goal status: INITIAL  4.  Re-assess 2 MWT Baseline: TBD Goal status: INITIAL  5.  Increase AROM L hip/knee to 90d Baseline:  Passive ROM Right eval Left eval  Hip flexion  85d  Hip extension    Hip abduction    Hip adduction    Hip internal rotation    Hip external rotation    Knee flexion  80d   Goal status: INITIAL  6.  Increase L LE strength to 4/5 Baseline:  MMT Right eval Left eval  Hip flexion  4-  Hip extension  4-  Hip abduction  3  Hip adduction    Hip internal rotation    Hip external rotation    Knee flexion  4-  Knee extension  4-  Ankle dorsiflexion    Ankle plantarflexion  4-   Goal status: INITIAL  PLAN:  PT FREQUENCY: 1-2x/week  PT DURATION: 6 weeks  PLANNED INTERVENTIONS: 97164- PT Re-evaluation, 97110-Therapeutic exercises, 97530- Therapeutic activity, 97112- Neuromuscular re-education, 97535- Self Care, 10272- Manual therapy, [phone]- Gait training, Balance training, and Stair training  PLAN FOR NEXT SESSION: HEP review and update, manual techniques as appropriate, aerobic tasks, ROM and flexibility activities, strengthening and PREs, TPDN, gait and balance training as needed    For all possible CPT codes, reference the Planned Interventions line above.     Check all conditions that are expected to impact treatment: {Conditions expected to impact treatment:Morbid obesity, Respiratory disorders, and Complications related to surgery   If treatment provided at initial evaluation, no treatment charged due to lack of authorization.       Berta Minor, PTA 07/04/2023, 2:44 PM

## 2023-07-10 NOTE — Therapy (Unsigned)
 OUTPATIENT PHYSICAL THERAPY TREATMENT NOTE   Patient Name: Frank Howe MRN: [id number] DOB:November 08, 1975, 48 y.o., male Today's Date: 07/11/2023  END OF SESSION:  PT End of Session - 07/11/23 0917     Visit Number 9    Number of Visits 12    Date for PT Re-Evaluation 08/02/23    Authorization Type MCD    Authorization Time Period 14 visits approved 06/05/23-09/03/23    Authorization - Number of Visits 14    PT Start Time 0915    PT Stop Time 1000    PT Time Calculation (min) 45 min    Activity Tolerance Patient tolerated treatment well;Patient limited by pain    Behavior During Therapy [facility] for tasks assessed/performed              Past Medical History:  Diagnosis Date   Anxiety    Arthritis    Complication of anesthesia    Fingers numb after surgery   Depression    Diabetes mellitus type 2 in obese 01/15/2021   Elevated blood pressure reading without diagnosis of hypertension    Gout    History of kidney stones    Hypertension    Impaired fasting blood sugar    Obesity    OSA (obstructive sleep apnea)    Past Surgical History:  Procedure Laterality Date   TOTAL HIP ARTHROPLASTY Left 05/21/2023   Procedure: LEFT TOTAL HIP ARTHROPLASTY ANTERIOR APPROACH;  Surgeon: Gean Birchwood, MD;  Location: [facility];  Service: Orthopedics;  Laterality: Left;   UMBILICAL HERNIA REPAIR N/A 09/14/2017   Procedure: LAPAROSCOPIC UMBILICAL HERNIA;  Surgeon: Romie Levee, MD;  Location: [facility];  Service: General;  Laterality: N/A;   UMBILICAL HERNIA REPAIR N/A 02/11/2018   Procedure: LAPARASCOPIC REDO REPAIR OF RECURRENT UMBILICAL HERNIA WITH INSERTION OF MESH, LYSIS OF ADHESIONS;  Surgeon: Andria Meuse, MD;  Location: [facility];  Service: General;  Laterality: N/A;   XI ROBOTIC ASSISTED VENTRAL HERNIA N/A 03/01/2021   Procedure: ROBOTIC RECURRENT INCISIONAL HERNIA 9CMx9CM REPAIR WITH MESH AND REMOVAL OF INTRAPERITONEAL MESH;  Surgeon: Quentin Ore, MD;  Location: [facility];   Service: General;  Laterality: N/A;   Patient Active Problem List   Diagnosis Date Noted   Arthritis of left hip 05/17/2023   Recurrent incisional hernia 03/01/2021   SBO (small bowel obstruction) (HCC) 01/15/2021   Essential hypertension 01/15/2021   Type 2 diabetes mellitus with obesity (HCC) 01/15/2021   Obesity, Class III, BMI 40-49.9 (morbid obesity) (HCC) 01/15/2021   Umbilical hernia with obstruction but no gangrene 02/11/2018   Partial small bowel obstruction (HCC) 09/13/2017   Gout, chronic 07/19/2015   OSA (obstructive sleep apnea) 07/19/2015   Obesity 06/29/2015   Elevated uric acid in blood 06/29/2015   Impaired fasting blood sugar 06/28/2015   Knee swelling 06/28/2015   Right knee pain 06/28/2015   Elevated blood pressure reading without diagnosis of hypertension 06/28/2015   Routine general medical examination at a health care facility 08/21/2011    PCP: Dois Davenport, MD   REFERRING PROVIDER: Gean Birchwood, MD  REFERRING DIAG: S.P LEFT HIP REPLACEMENT . PLEASE SCHEDULE PT APPOINTMERNT WITHIN 48 -72 HOURS OF SURGERY SURGERY DATE IS 1.27..2025  THERAPY DIAG:  Pain in left hip  Muscle weakness (generalized)  Other abnormalities of gait and mobility  Rationale for Evaluation and Treatment: Rehabilitation  ONSET DATE: DOS 05/21/23  SUBJECTIVE:   SUBJECTIVE STATEMENT: L hip continues to progress, minimal discomfort and limitations reported.  Will undergo R THA  on 4/28.  Has returned to gym for limited exercises  PERTINENT HISTORY: 48 yo male presents to therapy s/p L THA, anterior approach on 05/21/2023 due to failure of conservative measures. Pt PMH includes but is not limited to: hernia, SBO, HTN, DM II, gout, OSA, anxiety, and arthritis. PAIN:  Are you having pain? Yes: NPRS scale: 3-8/10 Pain location: L hip  Pain description: sore/stiff/ache Aggravating factors: L hip flexion to get in a bathtub/car  Relieving factors: rest  PRECAUTIONS: Anterior  hip  RED FLAGS: None   WEIGHT BEARING RESTRICTIONS: No  FALLS:  Has patient fallen in last 6 months? No  LIVING ENVIRONMENT: Lives with: lives with their spouse Lives in: House/apartment Stairs:  yes  Has following equipment at home: Single point cane  OCCUPATION: not working  PLOF: Independent  PATIENT GOALS: To regain my hip function  NEXT MD VISIT: TBD  OBJECTIVE:  Note: Objective measures were completed at Evaluation unless otherwise noted.  DIAGNOSTIC FINDINGS: none  PATIENT SURVEYS:  LEFS 17/80 21% functional  MUSCLE LENGTH: deferred  POSTURE:  flexed hip posture  LOWER EXTREMITY ROM:  Passive ROM Right eval Left eval  Hip flexion  85d  Hip extension    Hip abduction    Hip adduction    Hip internal rotation    Hip external rotation    Knee flexion  80d  Knee extension    Ankle dorsiflexion    Ankle plantarflexion    Ankle inversion    Ankle eversion     (Blank rows = not tested)  LOWER EXTREMITY MMT:  MMT Right eval Left eval  Hip flexion  4-  Hip extension  4-  Hip abduction  3  Hip adduction    Hip internal rotation    Hip external rotation    Knee flexion  4-  Knee extension  4-  Ankle dorsiflexion    Ankle plantarflexion  4-  Ankle inversion    Ankle eversion     (Blank rows = not tested)  LOWER EXTREMITY SPECIAL TESTS:  Deferred post-op  FUNCTIONAL TESTS:  30 seconds chair stand test 7 reps with UE Support  GAIT: Distance walked: 25ft x2 Assistive device utilized: Single point cane Level of assistance: Complete Independence Comments: slow cadence, antalgic gait, decreased step length                                                                                                                               TREATMENT DATE:  [facility] Adult PT Treatment:                                                DATE: 07/11/23 Therapeutic Exercise: Nustep (arms 8 seat 9) 8 min L6 Neuromuscular re-ed: Supine hip fallouts BlaTB 10x B  10/10 unilateral Bridge against BlaTB 10x  L clams BlaTB 10x Bridge with ball 15x FAQ with adduction with ball 15x 3 way step downs R from airex pad 10x(single UE support) Therapeutic Activity: 16 stairs with B rails and step through pattern L step ups onto foam RB 10x L lateral step ups onto foam RB 10x Heel raises off 4 in step 15x  [facility] Adult PT Treatment:                                                DATE: 07/04/23 Therapeutic Exercise: Nustep (arms 8 seat 9) 8 min L6 L hip flexor stretch x1' Neuromuscular re-ed: Supine hip fallouts BluTB 15x B 15/15 unilateral Bridge with ball 2x10 Runners step BIL 15x 8 in 10# KB (pain when stepping up with RLE) FAQ with adduction with ball 2x10 Therapeutic Activity: Step ups 8 in 15x L Lateral steps 8 in L 15x STS from Airex arms extended 10x  [facility] Adult PT Treatment:                                                DATE: 06/27/23 Therapeutic Exercise: L hip flexor stretch (RLE on ball) 30s x2 Nustep (arms 8 seat 9) 8 min L6 Neuromuscular re-ed: Supine hip fallouts BluTB 15x B 15/15 unilateral Bridge with ball 15x Runners step L 15x 6 in 10# KB FAQ with adduction 15x Therapeutic Activity:  Step ups 8 in 15x L  Lateral steps 8 in L 15x  STS from Airex arms extended 10x   [facility] Adult PT Treatment:                                                DATE: 06/22/23 Therapeutic Exercise: L hip flexor stretch (RLE on ball) 30s x2 Nustep (arms 8 seat 10) 8 min L6  Therapeutic Activity: FAQs L 2# 15x 2s hold DKTC over ball 2s hold 15x Supine hip fallouts BlueTB 15x B, 15x Lt only today Bridge against BlueTB 2x10 Bridge with ball 15x Runners step L 10x 4 in   PATIENT EDUCATION:  Education details: Discussed eval findings, rehab rationale and POC and patient is in agreement  Person educated: Patient Education method: Explanation Education comprehension: verbalized understanding and needs further education  HOME EXERCISE PROGRAM: Access  Code: [email or web address] URL: [email or web address] Date: 06/06/2023 Prepared by: Gustavus Bryant  Exercises - Standing Heel Raise with Support  - 2-3 x daily - 5 x weekly - 1 sets - 15 reps - Seated Table Hamstring Stretch  - 2-3 x daily - 5 x weekly - 1 sets - 2 reps - 30s hold - Supine Heel Slide with Strap  - 2-3 x daily - 5 x weekly - 1 sets - 15 reps - Modified Thomas Stretch  - 2-3 x daily - 5 x weekly - 1 sets - 2 reps - 30s hold  ASSESSMENT:  CLINICAL IMPRESSION: Continues to present with minimal issues in L hip, R hip surgery has been scheduled.  Assessed stair climbing form with no correction to form needed.  Advanced T-band resistance but decreased reps to accommodate added challenge.  Incorporated stepping  tasks onto compliant surfaces but UE support needed.  (Eval) Patient is a 48 y.o. male who was seen today for physical therapy evaluation and treatment for L hip weakness, decreased ROM and discomfort following L THA anterior approach.  Patient presents with decreased ROM in L hip and knee due to soft tissue trauma and swelling.  Bed mobility and transfers functional.  Strength deficits noted with 30s chair stand test.  OBJECTIVE IMPAIRMENTS: Abnormal gait, decreased activity tolerance, decreased balance, decreased coordination, decreased mobility, difficulty walking, decreased ROM, decreased strength, impaired perceived functional ability, improper body mechanics, postural dysfunction, and pain.   ACTIVITY LIMITATIONS: carrying, lifting, bending, sitting, standing, squatting, stairs, and bed mobility  REHAB POTENTIAL: Good  CLINICAL DECISION MAKING: Stable/uncomplicated  EVALUATION COMPLEXITY: Low   GOALS: Goals reviewed with patient? No  SHORT TERM GOALS: Target date: 06/25/2023   Patient to demonstrate independence in HEP  Baseline: TBD Goal status: Met Pt reports non-compliance 07/04/23  2.  Assess 2 MWT and establish baseline Baseline: TBD Goal status:  INITIAL   LONG TERM GOALS: Target date: 07/16/2023    Patient will increase 30s chair stand reps from 7 to 10 with/without arms to demonstrate and improved functional ability with less pain/difficulty as well as reduce fall risk.  Baseline: 7 reps with UE support Goal status: INITIAL  2.  Patient will score at least 40% on LEFS to signify clinically meaningful improvement in functional abilities.   Baseline: 21% Goal status: INITIAL  3.  Patient will acknowledge 4/10 pain at least once during episode of care   Baseline: 8/10 Goal status: INITIAL  4.  Re-assess 2 MWT Baseline: TBD Goal status: INITIAL  5.  Increase AROM L hip/knee to 90d Baseline:  Passive ROM Right eval Left eval  Hip flexion  85d  Hip extension    Hip abduction    Hip adduction    Hip internal rotation    Hip external rotation    Knee flexion  80d   Goal status: INITIAL  6.  Increase L LE strength to 4/5 Baseline:  MMT Right eval Left eval  Hip flexion  4-  Hip extension  4-  Hip abduction  3  Hip adduction    Hip internal rotation    Hip external rotation    Knee flexion  4-  Knee extension  4-  Ankle dorsiflexion    Ankle plantarflexion  4-   Goal status: INITIAL  PLAN:  PT FREQUENCY: 1-2x/week  PT DURATION: 6 weeks  PLANNED INTERVENTIONS: 97164- PT Re-evaluation, 97110-Therapeutic exercises, 97530- Therapeutic activity, 97112- Neuromuscular re-education, 97535- Self Care, 64403- Manual therapy, [phone]- Gait training, Balance training, and Stair training  PLAN FOR NEXT SESSION: HEP review and update, manual techniques as appropriate, aerobic tasks, ROM and flexibility activities, strengthening and PREs, TPDN, gait and balance training as needed    For all possible CPT codes, reference the Planned Interventions line above.     Check all conditions that are expected to impact treatment: {Conditions expected to impact treatment:Morbid obesity, Respiratory disorders, and Complications  related to surgery   If treatment provided at initial evaluation, no treatment charged due to lack of authorization.       Hildred Laser, PT 07/11/2023, 9:59 AM

## 2023-07-11 ENCOUNTER — Ambulatory Visit

## 2023-07-11 DIAGNOSIS — R2689 Other abnormalities of gait and mobility: Secondary | ICD-10-CM

## 2023-07-11 DIAGNOSIS — M25552 Pain in left hip: Secondary | ICD-10-CM

## 2023-07-11 DIAGNOSIS — M6281 Muscle weakness (generalized): Secondary | ICD-10-CM

## 2023-07-16 NOTE — Therapy (Unsigned)
 OUTPATIENT PHYSICAL THERAPY TREATMENT NOTE   Patient Name: Frank Howe MRN: [id number] DOB:Feb 09, 1976, 48 y.o., male Today's Date: 07/17/2023  END OF SESSION:  PT End of Session - 07/17/23 1128     Visit Number 10    Number of Visits 12    Date for PT Re-Evaluation 08/02/23    Authorization Type MCD    Authorization Time Period 14 visits approved 06/05/23-09/03/23    Authorization - Visit Number 10    Authorization - Number of Visits 14    PT Start Time 1130    PT Stop Time 1210    PT Time Calculation (min) 40 min    Activity Tolerance Patient tolerated treatment well;Patient limited by pain    Behavior During Therapy [facility] for tasks assessed/performed               Past Medical History:  Diagnosis Date   Anxiety    Arthritis    Complication of anesthesia    Fingers numb after surgery   Depression    Diabetes mellitus type 2 in obese 01/15/2021   Elevated blood pressure reading without diagnosis of hypertension    Gout    History of kidney stones    Hypertension    Impaired fasting blood sugar    Obesity    OSA (obstructive sleep apnea)    Past Surgical History:  Procedure Laterality Date   TOTAL HIP ARTHROPLASTY Left 05/21/2023   Procedure: LEFT TOTAL HIP ARTHROPLASTY ANTERIOR APPROACH;  Surgeon: Gean Birchwood, MD;  Location: [facility];  Service: Orthopedics;  Laterality: Left;   UMBILICAL HERNIA REPAIR N/A 09/14/2017   Procedure: LAPAROSCOPIC UMBILICAL HERNIA;  Surgeon: Romie Levee, MD;  Location: [facility];  Service: General;  Laterality: N/A;   UMBILICAL HERNIA REPAIR N/A 02/11/2018   Procedure: LAPARASCOPIC REDO REPAIR OF RECURRENT UMBILICAL HERNIA WITH INSERTION OF MESH, LYSIS OF ADHESIONS;  Surgeon: Andria Meuse, MD;  Location: [facility];  Service: General;  Laterality: N/A;   XI ROBOTIC ASSISTED VENTRAL HERNIA N/A 03/01/2021   Procedure: ROBOTIC RECURRENT INCISIONAL HERNIA 9CMx9CM REPAIR WITH MESH AND REMOVAL OF INTRAPERITONEAL MESH;  Surgeon:  Quentin Ore, MD;  Location: [facility];  Service: General;  Laterality: N/A;   Patient Active Problem List   Diagnosis Date Noted   Arthritis of left hip 05/17/2023   Recurrent incisional hernia 03/01/2021   SBO (small bowel obstruction) (HCC) 01/15/2021   Essential hypertension 01/15/2021   Type 2 diabetes mellitus with obesity (HCC) 01/15/2021   Obesity, Class III, BMI 40-49.9 (morbid obesity) (HCC) 01/15/2021   Umbilical hernia with obstruction but no gangrene 02/11/2018   Partial small bowel obstruction (HCC) 09/13/2017   Gout, chronic 07/19/2015   OSA (obstructive sleep apnea) 07/19/2015   Obesity 06/29/2015   Elevated uric acid in blood 06/29/2015   Impaired fasting blood sugar 06/28/2015   Knee swelling 06/28/2015   Right knee pain 06/28/2015   Elevated blood pressure reading without diagnosis of hypertension 06/28/2015   Routine general medical examination at a health care facility 08/21/2011    PCP: Dois Davenport, MD   REFERRING PROVIDER: Gean Birchwood, MD  REFERRING DIAG: S.P LEFT HIP REPLACEMENT . PLEASE SCHEDULE PT APPOINTMERNT WITHIN 48 -72 HOURS OF SURGERY SURGERY DATE IS 1.27..2025  THERAPY DIAG:  Pain in left hip  Other abnormalities of gait and mobility  Muscle weakness (generalized)  Rationale for Evaluation and Treatment: Rehabilitation  ONSET DATE: DOS 05/21/23  SUBJECTIVE:   SUBJECTIVE STATEMENT: No issues since last session.  Has returned to gym but equipment selection is limited.  PERTINENT HISTORY: 48 yo male presents to therapy s/p L THA, anterior approach on 05/21/2023 due to failure of conservative measures. Pt PMH includes but is not limited to: hernia, SBO, HTN, DM II, gout, OSA, anxiety, and arthritis. PAIN:  Are you having pain? Yes: NPRS scale: 3-8/10 Pain location: L hip  Pain description: sore/stiff/ache Aggravating factors: L hip flexion to get in a bathtub/car  Relieving factors: rest  PRECAUTIONS: Anterior hip  RED  FLAGS: None   WEIGHT BEARING RESTRICTIONS: No  FALLS:  Has patient fallen in last 6 months? No  LIVING ENVIRONMENT: Lives with: lives with their spouse Lives in: House/apartment Stairs:  yes  Has following equipment at home: Single point cane  OCCUPATION: not working  PLOF: Independent  PATIENT GOALS: To regain my hip function  NEXT MD VISIT: TBD  OBJECTIVE:  Note: Objective measures were completed at Evaluation unless otherwise noted.  DIAGNOSTIC FINDINGS: none  PATIENT SURVEYS:  LEFS 17/80 21% functional  MUSCLE LENGTH: deferred  POSTURE:  flexed hip posture  LOWER EXTREMITY ROM:  Passive ROM Right eval Left eval  Hip flexion  85d  Hip extension    Hip abduction    Hip adduction    Hip internal rotation    Hip external rotation    Knee flexion  80d  Knee extension    Ankle dorsiflexion    Ankle plantarflexion    Ankle inversion    Ankle eversion     (Blank rows = not tested)  LOWER EXTREMITY MMT:  MMT Right eval Left eval  Hip flexion  4-  Hip extension  4-  Hip abduction  3  Hip adduction    Hip internal rotation    Hip external rotation    Knee flexion  4-  Knee extension  4-  Ankle dorsiflexion    Ankle plantarflexion  4-  Ankle inversion    Ankle eversion     (Blank rows = not tested)  LOWER EXTREMITY SPECIAL TESTS:  Deferred post-op  FUNCTIONAL TESTS:  30 seconds chair stand test 7 reps with UE Support  GAIT: Distance walked: 57ft x2 Assistive device utilized: Single point cane Level of assistance: Complete Independence Comments: slow cadence, antalgic gait, decreased step length                                                                                                                               TREATMENT DATE:  [facility] Adult PT Treatment:                                                DATE: 07/17/23 Therapeutic Exercise: Nustep (arms 8 seat 9) 8 min L6 Neuromuscular re-ed: Supine hip fallouts BlaTB 12x B 12/12  unilateral Bridge against BlaTB 12x L  clams BlaTB 12x Bridge with ball 15x FAQ with adduction with ball 15x 3 way step downs R from airex pad 10x(single UE support) Therapeutic Activity: Single leg press L 35# 15x2 Heel raises off 4 in step 15x Runners step L 15x from RB  [facility] Adult PT Treatment:                                                DATE: 07/11/23 Therapeutic Exercise: Nustep (arms 8 seat 9) 8 min L6 Neuromuscular re-ed: Supine hip fallouts BlaTB 10x B 10/10 unilateral Bridge against BlaTB 10x L clams BlaTB 10x Bridge with ball 15x FAQ with adduction with ball 15x 3 way step downs R from airex pad 10x(single UE support) Therapeutic Activity: 16 stairs with B rails and step through pattern L step ups onto foam RB 10x L lateral step ups onto foam RB 10x Heel raises off 4 in step 15x  [facility] Adult PT Treatment:                                                DATE: 07/04/23 Therapeutic Exercise: Nustep (arms 8 seat 9) 8 min L6 L hip flexor stretch x1' Neuromuscular re-ed: Supine hip fallouts BluTB 15x B 15/15 unilateral Bridge with ball 2x10 Runners step BIL 15x 8 in 10# KB (pain when stepping up with RLE) FAQ with adduction with ball 2x10 Therapeutic Activity: Step ups 8 in 15x L Lateral steps 8 in L 15x STS from Airex arms extended 10x  [facility] Adult PT Treatment:                                                DATE: 06/27/23 Therapeutic Exercise: L hip flexor stretch (RLE on ball) 30s x2 Nustep (arms 8 seat 9) 8 min L6 Neuromuscular re-ed: Supine hip fallouts BluTB 15x B 15/15 unilateral Bridge with ball 15x Runners step L 15x 6 in 10# KB FAQ with adduction 15x Therapeutic Activity:  Step ups 8 in 15x L  Lateral steps 8 in L 15x  STS from Airex arms extended 10x   [facility] Adult PT Treatment:                                                DATE: 06/22/23 Therapeutic Exercise: L hip flexor stretch (RLE on ball) 30s x2 Nustep (arms 8 seat 10) 8 min L6  Therapeutic  Activity: FAQs L 2# 15x 2s hold DKTC over ball 2s hold 15x Supine hip fallouts BlueTB 15x B, 15x Lt only today Bridge against BlueTB 2x10 Bridge with ball 15x Runners step L 10x 4 in   PATIENT EDUCATION:  Education details: Discussed eval findings, rehab rationale and POC and patient is in agreement  Person educated: Patient Education method: Explanation Education comprehension: verbalized understanding and needs further education  HOME EXERCISE PROGRAM: Access Code: [email or web address] URL: [email or web address] Date: 06/06/2023 Prepared by: Gustavus Bryant  Exercises - Standing Heel Raise with Support  - 2-3  x daily - 5 x weekly - 1 sets - 15 reps - Seated Table Hamstring Stretch  - 2-3 x daily - 5 x weekly - 1 sets - 2 reps - 30s hold - Supine Heel Slide with Strap  - 2-3 x daily - 5 x weekly - 1 sets - 15 reps - Modified Thomas Stretch  - 2-3 x daily - 5 x weekly - 1 sets - 2 reps - 30s hold  ASSESSMENT:  CLINICAL IMPRESSION: Introduced CKC leg press today, L only due to pain on R.  Increased reps as noted to challenge strength in L hip.  Continued balance and proprioceptive work on compliant surfaces.  Able to perform stepping tasks from airex with minimal need of UE support.  (Eval) Patient is a 48 y.o. male who was seen today for physical therapy evaluation and treatment for L hip weakness, decreased ROM and discomfort following L THA anterior approach.  Patient presents with decreased ROM in L hip and knee due to soft tissue trauma and swelling.  Bed mobility and transfers functional.  Strength deficits noted with 30s chair stand test.  OBJECTIVE IMPAIRMENTS: Abnormal gait, decreased activity tolerance, decreased balance, decreased coordination, decreased mobility, difficulty walking, decreased ROM, decreased strength, impaired perceived functional ability, improper body mechanics, postural dysfunction, and pain.   ACTIVITY LIMITATIONS: carrying, lifting, bending,  sitting, standing, squatting, stairs, and bed mobility  REHAB POTENTIAL: Good  CLINICAL DECISION MAKING: Stable/uncomplicated  EVALUATION COMPLEXITY: Low   GOALS: Goals reviewed with patient? No  SHORT TERM GOALS: Target date: 06/25/2023   Patient to demonstrate independence in HEP  Baseline: TBD Goal status: Met Pt reports non-compliance 07/04/23  2.  Assess 2 MWT and establish baseline Baseline: TBD Goal status: INITIAL   LONG TERM GOALS: Target date: 07/16/2023    Patient will increase 30s chair stand reps from 7 to 10 with/without arms to demonstrate and improved functional ability with less pain/difficulty as well as reduce fall risk.  Baseline: 7 reps with UE support Goal status: INITIAL  2.  Patient will score at least 40% on LEFS to signify clinically meaningful improvement in functional abilities.   Baseline: 21% Goal status: INITIAL  3.  Patient will acknowledge 4/10 pain at least once during episode of care   Baseline: 8/10 Goal status: INITIAL  4.  Re-assess 2 MWT Baseline: TBD Goal status: INITIAL  5.  Increase AROM L hip/knee to 90d Baseline:  Passive ROM Right eval Left eval  Hip flexion  85d  Hip extension    Hip abduction    Hip adduction    Hip internal rotation    Hip external rotation    Knee flexion  80d   Goal status: INITIAL  6.  Increase L LE strength to 4/5 Baseline:  MMT Right eval Left eval  Hip flexion  4-  Hip extension  4-  Hip abduction  3  Hip adduction    Hip internal rotation    Hip external rotation    Knee flexion  4-  Knee extension  4-  Ankle dorsiflexion    Ankle plantarflexion  4-   Goal status: INITIAL  PLAN:  PT FREQUENCY: 1-2x/week  PT DURATION: 6 weeks  PLANNED INTERVENTIONS: 97164- PT Re-evaluation, 97110-Therapeutic exercises, 97530- Therapeutic activity, 97112- Neuromuscular re-education, 97535- Self Care, 13086- Manual therapy, [phone]- Gait training, Balance training, and Stair  training  PLAN FOR NEXT SESSION: HEP review and update, manual techniques as appropriate, aerobic tasks, ROM and flexibility activities, strengthening and  PREs, TPDN, gait and balance training as needed    For all possible CPT codes, reference the Planned Interventions line above.     Check all conditions that are expected to impact treatment: {Conditions expected to impact treatment:Morbid obesity, Respiratory disorders, and Complications related to surgery   If treatment provided at initial evaluation, no treatment charged due to lack of authorization.       Hildred Laser, PT 07/17/2023, 12:08 PM

## 2023-07-17 ENCOUNTER — Ambulatory Visit

## 2023-07-17 DIAGNOSIS — M25552 Pain in left hip: Secondary | ICD-10-CM

## 2023-07-17 DIAGNOSIS — M6281 Muscle weakness (generalized): Secondary | ICD-10-CM

## 2023-07-17 DIAGNOSIS — R2689 Other abnormalities of gait and mobility: Secondary | ICD-10-CM

## 2023-07-18 ENCOUNTER — Other Ambulatory Visit: Payer: Self-pay | Admitting: Orthopedic Surgery

## 2023-07-18 NOTE — Therapy (Signed)
 OUTPATIENT PHYSICAL THERAPY TREATMENT NOTE/DISCHARGE SUMMARY   Patient Name: Frank Howe MRN: [id number] DOB:28-Feb-1976, 48 y.o., male Today's Date: 07/23/2023  END OF SESSION:  PT End of Session - 07/23/23 1051     Visit Number 11    Number of Visits 12    Date for PT Re-Evaluation 08/02/23    Authorization Type MCD    Authorization Time Period 14 visits approved 06/05/23-09/03/23    Authorization - Visit Number 11    Authorization - Number of Visits 14    PT Start Time 1045    PT Stop Time 1130    PT Time Calculation (min) 45 min    Activity Tolerance Patient tolerated treatment well;Patient limited by pain    Behavior During Therapy [facility] for tasks assessed/performed                Past Medical History:  Diagnosis Date   Anxiety    Arthritis    Complication of anesthesia    Fingers numb after surgery   Depression    Diabetes mellitus type 2 in obese 01/15/2021   Elevated blood pressure reading without diagnosis of hypertension    Gout    History of kidney stones    Hypertension    Impaired fasting blood sugar    Obesity    OSA (obstructive sleep apnea)    Past Surgical History:  Procedure Laterality Date   TOTAL HIP ARTHROPLASTY Left 05/21/2023   Procedure: LEFT TOTAL HIP ARTHROPLASTY ANTERIOR APPROACH;  Surgeon: Gean Birchwood, MD;  Location: [facility];  Service: Orthopedics;  Laterality: Left;   UMBILICAL HERNIA REPAIR N/A 09/14/2017   Procedure: LAPAROSCOPIC UMBILICAL HERNIA;  Surgeon: Romie Levee, MD;  Location: [facility];  Service: General;  Laterality: N/A;   UMBILICAL HERNIA REPAIR N/A 02/11/2018   Procedure: LAPARASCOPIC REDO REPAIR OF RECURRENT UMBILICAL HERNIA WITH INSERTION OF MESH, LYSIS OF ADHESIONS;  Surgeon: Andria Meuse, MD;  Location: [facility];  Service: General;  Laterality: N/A;   XI ROBOTIC ASSISTED VENTRAL HERNIA N/A 03/01/2021   Procedure: ROBOTIC RECURRENT INCISIONAL HERNIA 9CMx9CM REPAIR WITH MESH AND REMOVAL OF INTRAPERITONEAL MESH;   Surgeon: Quentin Ore, MD;  Location: [facility];  Service: General;  Laterality: N/A;   Patient Active Problem List   Diagnosis Date Noted   Arthritis of left hip 05/17/2023   Recurrent incisional hernia 03/01/2021   SBO (small bowel obstruction) (HCC) 01/15/2021   Essential hypertension 01/15/2021   Type 2 diabetes mellitus with obesity (HCC) 01/15/2021   Obesity, Class III, BMI 40-49.9 (morbid obesity) (HCC) 01/15/2021   Umbilical hernia with obstruction but no gangrene 02/11/2018   Partial small bowel obstruction (HCC) 09/13/2017   Gout, chronic 07/19/2015   OSA (obstructive sleep apnea) 07/19/2015   Obesity 06/29/2015   Elevated uric acid in blood 06/29/2015   Impaired fasting blood sugar 06/28/2015   Knee swelling 06/28/2015   Right knee pain 06/28/2015   Elevated blood pressure reading without diagnosis of hypertension 06/28/2015   Routine general medical examination at a health care facility 08/21/2011    PCP: Dois Davenport, MD   REFERRING PROVIDER: Gean Birchwood, MD  REFERRING DIAG: S.P LEFT HIP REPLACEMENT . PLEASE SCHEDULE PT APPOINTMERNT WITHIN 48 -72 HOURS OF SURGERY SURGERY DATE IS 1.27..2025  THERAPY DIAG:  Pain in left hip  Other abnormalities of gait and mobility  Muscle weakness (generalized)  Rationale for Evaluation and Treatment: Rehabilitation  ONSET DATE: DOS 05/21/23  SUBJECTIVE:   SUBJECTIVE STATEMENT: No L hip pain  to report.  R THA scheduled in April.  Returns to work this afternoon  PERTINENT HISTORY: 48 yo male presents to therapy s/p L THA, anterior approach on 05/21/2023 due to failure of conservative measures. Pt PMH includes but is not limited to: hernia, SBO, HTN, DM II, gout, OSA, anxiety, and arthritis. PAIN:  Are you having pain? Yes: NPRS scale: 3-8/10 Pain location: L hip  Pain description: sore/stiff/ache Aggravating factors: L hip flexion to get in a bathtub/car  Relieving factors: rest  PRECAUTIONS: Anterior  hip  RED FLAGS: None   WEIGHT BEARING RESTRICTIONS: No  FALLS:  Has patient fallen in last 6 months? No  LIVING ENVIRONMENT: Lives with: lives with their spouse Lives in: House/apartment Stairs:  yes  Has following equipment at home: Single point cane  OCCUPATION: not working  PLOF: Independent  PATIENT GOALS: To regain my hip function  NEXT MD VISIT: TBD  OBJECTIVE:  Note: Objective measures were completed at Evaluation unless otherwise noted.  DIAGNOSTIC FINDINGS: none  PATIENT SURVEYS:  LEFS 17/80 21% functional 07/23/23 76/80 95% functional  MUSCLE LENGTH: deferred  POSTURE:  flexed hip posture  LOWER EXTREMITY ROM:  Passive ROM Right eval Left eval L 07/23/23  Hip flexion  85d 100d  Hip extension     Hip abduction     Hip adduction     Hip internal rotation     Hip external rotation     Knee flexion  80d 100d  Knee extension     Ankle dorsiflexion     Ankle plantarflexion     Ankle inversion     Ankle eversion      (Blank rows = not tested)  LOWER EXTREMITY MMT:  MMT Right eval Left eval L 07/23/23  Hip flexion  4- 4  Hip extension  4- 4  Hip abduction  3 3+  Hip adduction     Hip internal rotation     Hip external rotation     Knee flexion  4- 4  Knee extension  4- 4  Ankle dorsiflexion     Ankle plantarflexion  4- 4  Ankle inversion     Ankle eversion      (Blank rows = not tested)  LOWER EXTREMITY SPECIAL TESTS:  Deferred post-op  FUNCTIONAL TESTS:  30 seconds chair stand test 7 reps with UE Support 07/23/23 9 w/o UE support  GAIT: Distance walked: 62ft x2 Assistive device utilized: Single point cane Level of assistance: Complete Independence Comments: slow cadence, antalgic gait, decreased step length                                                                                                                               TREATMENT DATE:  [facility] Adult PT Treatment:  DATE:  07/23/23 Therapeutic Exercise: Nustep (arms 8 seat 9) 8 min L6  Neuromuscular re-ed: Supine hip fallouts BlaTB 15x B 15/15 unilateral Bridge against BlaTB 15x L clams BlaTB 15x Bridge with ball 15x FAQ with adduction with ball 15x Therapeutic Activity: Re-assessment of goal progress, LEFS and pain levels prior to DC Single leg press L 45# 15x2 Heel raises off 4 in step 15x2 Runners step B 4 in 15# KB 10/10  [facility] Adult PT Treatment:                                                DATE: 07/17/23 Therapeutic Exercise: Nustep (arms 8 seat 9) 8 min L6 Neuromuscular re-ed: Supine hip fallouts BlaTB 12x B 12/12 unilateral Bridge against BlaTB 12x L clams BlaTB 12x Bridge with ball 15x FAQ with adduction with ball 15x 3 way step downs R from airex pad 10x(single UE support) Therapeutic Activity: Single leg press L 35# 15x2 Heel raises off 4 in step 15x Runners step L 15x from RB  [facility] Adult PT Treatment:                                                DATE: 07/11/23 Therapeutic Exercise: Nustep (arms 8 seat 9) 8 min L6 Neuromuscular re-ed: Supine hip fallouts BlaTB 10x B 10/10 unilateral Bridge against BlaTB 10x L clams BlaTB 10x Bridge with ball 15x FAQ with adduction with ball 15x 3 way step downs R from airex pad 10x(single UE support) Therapeutic Activity: 16 stairs with B rails and step through pattern L step ups onto foam RB 10x L lateral step ups onto foam RB 10x Heel raises off 4 in step 15x  [facility] Adult PT Treatment:                                                DATE: 07/04/23 Therapeutic Exercise: Nustep (arms 8 seat 9) 8 min L6 L hip flexor stretch x1' Neuromuscular re-ed: Supine hip fallouts BluTB 15x B 15/15 unilateral Bridge with ball 2x10 Runners step BIL 15x 8 in 10# KB (pain when stepping up with RLE) FAQ with adduction with ball 2x10 Therapeutic Activity: Step ups 8 in 15x L Lateral steps 8 in L 15x STS from Airex arms extended 10x  [facility] Adult PT  Treatment:                                                DATE: 06/27/23 Therapeutic Exercise: L hip flexor stretch (RLE on ball) 30s x2 Nustep (arms 8 seat 9) 8 min L6 Neuromuscular re-ed: Supine hip fallouts BluTB 15x B 15/15 unilateral Bridge with ball 15x Runners step L 15x 6 in 10# KB FAQ with adduction 15x Therapeutic Activity:  Step ups 8 in 15x L  Lateral steps 8 in L 15x  STS from Airex arms extended 10x   [facility] Adult PT Treatment:  DATE: 06/22/23 Therapeutic Exercise: L hip flexor stretch (RLE on ball) 30s x2 Nustep (arms 8 seat 10) 8 min L6  Therapeutic Activity: FAQs L 2# 15x 2s hold DKTC over ball 2s hold 15x Supine hip fallouts BlueTB 15x B, 15x Lt only today Bridge against BlueTB 2x10 Bridge with ball 15x Runners step L 10x 4 in   PATIENT EDUCATION:  Education details: Discussed eval findings, rehab rationale and POC and patient is in agreement  Person educated: Patient Education method: Explanation Education comprehension: verbalized understanding and needs further education  HOME EXERCISE PROGRAM: Access Code: [email or web address] URL: [email or web address] Date: 06/06/2023 Prepared by: Gustavus Bryant  Exercises - Standing Heel Raise with Support  - 2-3 x daily - 5 x weekly - 1 sets - 15 reps - Seated Table Hamstring Stretch  - 2-3 x daily - 5 x weekly - 1 sets - 2 reps - 30s hold - Supine Heel Slide with Strap  - 2-3 x daily - 5 x weekly - 1 sets - 15 reps - Modified Thomas Stretch  - 2-3 x daily - 5 x weekly - 1 sets - 2 reps - 30s hold  ASSESSMENT:  CLINICAL IMPRESSION: Introduced CKC leg press today, L only due to pain on R.  Increased reps as noted to challenge strength in L hip.  Continued balance and proprioceptive work on compliant surfaces.  Able to perform stepping tasks from airex with minimal need of UE support.  (Eval) Patient is a 48 y.o. male who was seen today for physical therapy  evaluation and treatment for L hip weakness, decreased ROM and discomfort following L THA anterior approach.  Patient presents with decreased ROM in L hip and knee due to soft tissue trauma and swelling.  Bed mobility and transfers functional.  Strength deficits noted with 30s chair stand test.  OBJECTIVE IMPAIRMENTS: Abnormal gait, decreased activity tolerance, decreased balance, decreased coordination, decreased mobility, difficulty walking, decreased ROM, decreased strength, impaired perceived functional ability, improper body mechanics, postural dysfunction, and pain.   ACTIVITY LIMITATIONS: carrying, lifting, bending, sitting, standing, squatting, stairs, and bed mobility  REHAB POTENTIAL: Good  CLINICAL DECISION MAKING: Stable/uncomplicated  EVALUATION COMPLEXITY: Low   GOALS: Goals reviewed with patient? No  SHORT TERM GOALS: Target date: 06/25/2023   Patient to demonstrate independence in HEP  Baseline: TBD Goal status: Met Pt reports non-compliance 07/04/23  2.  Assess 2 MWT and establish baseline Baseline: TBD; deferred Goal status: Met   LONG TERM GOALS: Target date: 07/16/2023    Patient will increase 30s chair stand reps from 7 to 10 with/without arms to demonstrate and improved functional ability with less pain/difficulty as well as reduce fall risk.  Baseline: 7 reps with UE support; 07/23/23 9 w/o UE support Goal status: Met  2.  Patient will score at least 40% on LEFS to signify clinically meaningful improvement in functional abilities.   Baseline: 21%; 07/23/23 95% 76/80 Goal status: Met  3.  Patient will acknowledge 4/10 pain at least once during episode of care   Baseline: 8/10; 0/10 L hip pain Goal status: Met  4.  Re-assess 2 MWT Baseline: TBD; 07/23/23 deferred Goal status: Met  5.  Increase AROM L hip/knee to 90d Baseline:  Passive ROM Right eval Left eval L 07/23/23  Hip flexion  85d 100d  Hip extension     Hip abduction     Hip adduction      Hip internal rotation     Hip external rotation  Knee flexion  80d 100d   Goal status: Met  6.  Increase L LE strength to 4/5 Baseline:  MMT Right eval Left eval L 07/23/23  Hip flexion  4- 4  Hip extension  4- 4  Hip abduction  3 3+  Hip adduction     Hip internal rotation     Hip external rotation     Knee flexion  4- 4  Knee extension  4- 4  Ankle dorsiflexion     Ankle plantarflexion  4- 4   Goal status: Met  PLAN:  PT FREQUENCY: 1-2x/week  PT DURATION: 6 weeks  PLANNED INTERVENTIONS: 97164- PT Re-evaluation, 97110-Therapeutic exercises, 97530- Therapeutic activity, 97112- Neuromuscular re-education, 97535- Self Care, 14782- Manual therapy, 95621- Gait training, Balance training, and Stair training  PLAN FOR NEXT SESSION: HEP review and update, manual techniques as appropriate, aerobic tasks, ROM and flexibility activities, strengthening and PREs, TPDN, gait and balance training as needed    For all possible CPT codes, reference the Planned Interventions line above.     Check all conditions that are expected to impact treatment: {Conditions expected to impact treatment:Morbid obesity, Respiratory disorders, and Complications related to surgery   If treatment provided at initial evaluation, no treatment charged due to lack of authorization.       Hildred Laser, PT 07/23/2023, 11:25 AM

## 2023-07-23 ENCOUNTER — Ambulatory Visit

## 2023-07-23 DIAGNOSIS — R2689 Other abnormalities of gait and mobility: Secondary | ICD-10-CM

## 2023-07-23 DIAGNOSIS — M25552 Pain in left hip: Secondary | ICD-10-CM

## 2023-07-23 DIAGNOSIS — M6281 Muscle weakness (generalized): Secondary | ICD-10-CM

## 2023-08-03 NOTE — Patient Instructions (Signed)
 DUE TO COVID-19 ONLY TWO VISITORS  (aged 48 and older)  ARE ALLOWED TO COME WITH YOU AND STAY IN THE WAITING ROOM ONLY DURING PRE OP AND PROCEDURE.   **NO VISITORS ARE ALLOWED IN THE SHORT STAY AREA OR RECOVERY ROOM!!**  IF YOU WILL BE ADMITTED INTO THE HOSPITAL YOU ARE ALLOWED ONLY FOUR SUPPORT PEOPLE DURING VISITATION HOURS ONLY (7 AM -8PM)   The support person(s) must pass our screening, gel in and out, and wear a mask at all times, including in the patient's room. Patients must also wear a mask when staff or their support person are in the room. Visitors GUEST BADGE MUST BE WORN VISIBLY  One adult visitor may remain with you overnight and MUST be in the room by 8 P.M.     Your procedure is scheduled on: 08/20/23   Report to [facility] Main Entrance    Report to admitting at : 5:15 AM   Call this number if you have problems the morning of surgery [phone]   Do not eat food :After Midnight.   After Midnight you may have the following liquids until : 4:15 AM DAY OF SURGERY  Water Black Coffee (sugar ok, NO MILK/CREAM OR CREAMERS)  Tea (sugar ok, NO MILK/CREAM OR CREAMERS) regular and decaf                             Plain Jell-O (NO RED)                                           Fruit ices (not with fruit pulp, NO RED)                                     Popsicles (NO RED)                                                                  Juice: apple, WHITE grape, WHITE cranberry Sports drinks like Gatorade (NO RED)   The day of surgery:  Drink ONE (1) Pre-Surgery Clear G2 at : 4:15 AM the morning of surgery. Drink in one sitting. Do not sip.  This drink was given to you during your hospital  pre-op appointment visit. Nothing else to drink after completing the  Pre-Surgery Clear Ensure or G2.          If you have questions, please contact your surgeon's office.  FOLLOW ANY ADDITIONAL PRE OP INSTRUCTIONS YOU RECEIVED FROM YOUR SURGEON'S OFFICE!!!   Oral Hygiene  is also important to reduce your risk of infection.                                    Remember - BRUSH YOUR TEETH THE MORNING OF SURGERY WITH YOUR REGULAR TOOTHPASTE  DENTURES WILL BE REMOVED PRIOR TO SURGERY PLEASE DO NOT APPLY "Poly grip" OR ADHESIVES!!!   Do NOT smoke after Midnight   Take these medicines the morning of surgery  with A SIP OF WATER: carvedilol,amlodipine.Tramadol as needed.  How to Manage Your Diabetes Before and After Surgery  Why is it important to control my blood sugar before and after surgery? Improving blood sugar levels before and after surgery helps healing and can limit problems. A way of improving blood sugar control is eating a healthy diet by:  Eating less sugar and carbohydrates  Increasing activity/exercise  Talking with your doctor about reaching your blood sugar goals High blood sugars (greater than 180 mg/dL) can raise your risk of infections and slow your recovery, so you will need to focus on controlling your diabetes during the weeks before surgery. Make sure that the doctor who takes care of your diabetes knows about your planned surgery including the date and location.  How do I manage my blood sugar before surgery? Check your blood sugar at least 4 times a day, starting 2 days before surgery, to make sure that the level is not too high or low. Check your blood sugar the morning of your surgery when you wake up and every 2 hours until you get to the Short Stay unit. If your blood sugar is less than 70 mg/dL, you will need to treat for low blood sugar: Do not take insulin. Treat a low blood sugar (less than 70 mg/dL) with  cup of clear juice (cranberry or apple), 4 glucose tablets, OR glucose gel. Recheck blood sugar in 15 minutes after treatment (to make sure it is greater than 70 mg/dL). If your blood sugar is not greater than 70 mg/dL on recheck, call [phone] for further instructions. Report your blood sugar to the short stay nurse when  you get to Short Stay.  If you are admitted to the hospital after surgery: Your blood sugar will be checked by the staff and you will probably be given insulin after surgery (instead of oral diabetes medicines) to make sure you have good blood sugar levels. The goal for blood sugar control after surgery is 80-180 mg/dL.   WHAT DO I DO ABOUT MY DIABETES MEDICATION?      THE MORNING OF SURGERY, DO NOT TAKE ANY ORAL DIABETIC MEDICATIONS DAY OF YOUR SURGERY  DO NOT TAKE THE FOLLOWING 7 DAYS PRIOR TO SURGERY: Ozempic, Wegovy, Rybelsus (Semaglutide), Byetta (exenatide), Bydureon (exenatide ER), Victoza, Saxenda (liraglutide), or Trulicity (dulaglutide) Mounjaro (Tirzepatide) Adlyxin (Lixisenatide), Polyethylene Glycol Loxenatide.HOLD ozempic after: 08/12/23                              You may not have any metal on your body including hair pins, jewelry, and body piercing             Do not wear lotions, powders, perfumes/cologne, or deodorant              Men may shave face and neck.   Do not bring valuables to the hospital. [facility] IS NOT             RESPONSIBLE   FOR VALUABLES.   Contacts, glasses, or bridgework may not be worn into surgery.   Bring small overnight bag day of surgery.   DO NOT BRING YOUR HOME MEDICATIONS TO THE HOSPITAL. PHARMACY WILL DISPENSE MEDICATIONS LISTED ON YOUR MEDICATION LIST TO YOU DURING YOUR ADMISSION IN THE HOSPITAL!    Patients discharged on the day of surgery will not be allowed to drive home.  Someone NEEDS to stay with you for the first 24  hours after anesthesia.   Special Instructions: Bring a copy of your healthcare power of attorney and living will documents         the day of surgery if you haven't scanned them before.              Please read over the following fact sheets you were given: IF YOU HAVE QUESTIONS ABOUT YOUR PRE-OP INSTRUCTIONS PLEASE CALL [phone]      Pre-operative 5 CHG Bath Instructions   You can play a key role in  reducing the risk of infection after surgery. Your skin needs to be as free of germs as possible. You can reduce the number of germs on your skin by washing with CHG (chlorhexidine gluconate) soap before surgery. CHG is an antiseptic soap that kills germs and continues to kill germs even after washing.   DO NOT use if you have an allergy to chlorhexidine/CHG or antibacterial soaps. If your skin becomes reddened or irritated, stop using the CHG and notify one of our RNs at [phone].   Please shower with the CHG soap starting 4 days before surgery using the following schedule:     Please keep in mind the following:  DO NOT shave, including legs and underarms, starting the day of your first shower.   You may shave your face at any point before/day of surgery.  Place clean sheets on your bed the day you start using CHG soap. Use a clean washcloth (not used since being washed) for each shower. DO NOT sleep with pets once you start using the CHG.   CHG Shower Instructions:  If you choose to wash your hair and private area, wash first with your normal shampoo/soap.  After you use shampoo/soap, rinse your hair and body thoroughly to remove shampoo/soap residue.  Turn the water OFF and apply about 3 tablespoons (45 ml) of CHG soap to a CLEAN washcloth.  Apply CHG soap ONLY FROM YOUR NECK DOWN TO YOUR TOES (washing for 3-5 minutes)  DO NOT use CHG soap on face, private areas, open wounds, or sores.  Pay special attention to the area where your surgery is being performed.  If you are having back surgery, having someone wash your back for you may be helpful. Wait 2 minutes after CHG soap is applied, then you may rinse off the CHG soap.  Pat dry with a clean towel  Put on clean clothes/pajamas   If you choose to wear lotion, please use ONLY the CHG-compatible lotions on the back of this paper.     Additional instructions for the day of surgery: DO NOT APPLY any lotions, deodorants, cologne, or  perfumes.   Put on clean/comfortable clothes.  Brush your teeth.  Ask your nurse before applying any prescription medications to the skin.    CHG Compatible Lotions   Aveeno Moisturizing lotion  Cetaphil Moisturizing Cream  Cetaphil Moisturizing Lotion  Clairol Herbal Essence Moisturizing Lotion, Dry Skin  Clairol Herbal Essence Moisturizing Lotion, Extra Dry Skin  Clairol Herbal Essence Moisturizing Lotion, Normal Skin  Curel Age Defying Therapeutic Moisturizing Lotion with Alpha Hydroxy  Curel Extreme Care Body Lotion  Curel Soothing Hands Moisturizing Hand Lotion  Curel Therapeutic Moisturizing Cream, Fragrance-Free  Curel Therapeutic Moisturizing Lotion, Fragrance-Free  Curel Therapeutic Moisturizing Lotion, Original Formula  Eucerin Daily Replenishing Lotion  Eucerin Dry Skin Therapy Plus Alpha Hydroxy Crme  Eucerin Dry Skin Therapy Plus Alpha Hydroxy Lotion  Eucerin Original Crme  Eucerin Original Lotion  Eucerin Plus Crme Eucerin  Plus Lotion  Eucerin TriLipid Replenishing Lotion  Keri Anti-Bacterial Hand Lotion  Keri Deep Conditioning Original Lotion Dry Skin Formula Softly Scented  Keri Deep Conditioning Original Lotion, Fragrance Free Sensitive Skin Formula  Keri Lotion Fast Absorbing Fragrance Free Sensitive Skin Formula  Keri Lotion Fast Absorbing Softly Scented Dry Skin Formula  Keri Original Lotion  Keri Skin Renewal Lotion Keri Silky Smooth Lotion  Keri Silky Smooth Sensitive Skin Lotion  Nivea Body Creamy Conditioning Oil  Nivea Body Extra Enriched Lotion  Nivea Body Original Lotion  Nivea Body Sheer Moisturizing Lotion Nivea Crme  Nivea Skin Firming Lotion  NutraDerm 30 Skin Lotion  NutraDerm Skin Lotion  NutraDerm Therapeutic Skin Cream  NutraDerm Therapeutic Skin Lotion  ProShield Protective Hand Cream  Provon moisturizing lotion   Incentive Spirometer  An incentive spirometer is a tool that can help keep your lungs clear and active. This tool  measures how well you are filling your lungs with each breath. Taking long deep breaths may help reverse or decrease the chance of developing breathing (pulmonary) problems (especially infection) following: A long period of time when you are unable to move or be active. BEFORE THE PROCEDURE  If the spirometer includes an indicator to show your best effort, your nurse or respiratory therapist will set it to a desired goal. If possible, sit up straight or lean slightly forward. Try not to slouch. Hold the incentive spirometer in an upright position. INSTRUCTIONS FOR USE  Sit on the edge of your bed if possible, or sit up as far as you can in bed or on a chair. Hold the incentive spirometer in an upright position. Breathe out normally. Place the mouthpiece in your mouth and seal your lips tightly around it. Breathe in slowly and as deeply as possible, raising the piston or the ball toward the top of the column. Hold your breath for 3-5 seconds or for as long as possible. Allow the piston or ball to fall to the bottom of the column. Remove the mouthpiece from your mouth and breathe out normally. Rest for a few seconds and repeat Steps 1 through 7 at least 10 times every 1-2 hours when you are awake. Take your time and take a few normal breaths between deep breaths. The spirometer may include an indicator to show your best effort. Use the indicator as a goal to work toward during each repetition. After each set of 10 deep breaths, practice coughing to be sure your lungs are clear. If you have an incision (the cut made at the time of surgery), support your incision when coughing by placing a pillow or rolled up towels firmly against it. Once you are able to get out of bed, walk around indoors and cough well. You may stop using the incentive spirometer when instructed by your caregiver.  RISKS AND COMPLICATIONS Take your time so you do not get dizzy or light-headed. If you are in pain, you may need to  take or ask for pain medication before doing incentive spirometry. It is harder to take a deep breath if you are having pain. AFTER USE Rest and breathe slowly and easily. It can be helpful to keep track of a log of your progress. Your caregiver can provide you with a simple table to help with this. If you are using the spirometer at home, follow these instructions: SEEK MEDICAL CARE IF:  You are having difficultly using the spirometer. You have trouble using the spirometer as often as instructed. Your pain medication  is not giving enough relief while using the spirometer. You develop fever of 100.5 F (38.1 C) or higher. SEEK IMMEDIATE MEDICAL CARE IF:  You cough up bloody sputum that had not been present before. You develop fever of 102 F (38.9 C) or greater. You develop worsening pain at or near the incision site. MAKE SURE YOU:  Understand these instructions. Will watch your condition. Will get help right away if you are not doing well or get worse. Document Released: 08/21/2006 Document Revised: 07/03/2011 Document Reviewed: 10/22/2006 [facility] Patient Information 2014 [place], [place].   ________________________________________________________________________

## 2023-08-06 ENCOUNTER — Other Ambulatory Visit: Payer: Self-pay

## 2023-08-06 ENCOUNTER — Encounter (HOSPITAL_COMMUNITY): Payer: Self-pay

## 2023-08-06 ENCOUNTER — Encounter (HOSPITAL_COMMUNITY)
Admission: RE | Admit: 2023-08-06 | Discharge: 2023-08-06 | Disposition: A | Discharge status: Home / Self Care | Source: Ambulatory Visit | Attending: Orthopedic Surgery | Admitting: Orthopedic Surgery

## 2023-08-06 VITALS — BP 145/78 | HR 59 | Temp 97.8°F | Ht 69.0 in | Wt 266.0 lb

## 2023-08-06 DIAGNOSIS — I10 Essential (primary) hypertension: Secondary | ICD-10-CM | POA: Diagnosis not present

## 2023-08-06 DIAGNOSIS — Z01812 Encounter for preprocedural laboratory examination: Secondary | ICD-10-CM | POA: Insufficient documentation

## 2023-08-06 DIAGNOSIS — E119 Type 2 diabetes mellitus without complications: Secondary | ICD-10-CM

## 2023-08-06 DIAGNOSIS — E1169 Type 2 diabetes mellitus with other specified complication: Secondary | ICD-10-CM

## 2023-08-06 DIAGNOSIS — E669 Obesity, unspecified: Secondary | ICD-10-CM | POA: Insufficient documentation

## 2023-08-06 DIAGNOSIS — Z01818 Encounter for other preprocedural examination: Secondary | ICD-10-CM

## 2023-08-06 HISTORY — DX: Fatty (change of) liver, not elsewhere classified: K76.0

## 2023-08-06 LAB — GLUCOSE, CAPILLARY: Glucose-Capillary: 89 mg/dL (ref 70–99)

## 2023-08-06 LAB — TYPE AND SCREEN
ABO/RH(D): O POS
Antibody Screen: NEGATIVE

## 2023-08-06 LAB — BASIC METABOLIC PANEL WITH GFR
Anion gap: 9 (ref 5–15)
BUN: 15 mg/dL (ref 6–20)
CO2: 26 mmol/L (ref 22–32)
Calcium: 9.2 mg/dL (ref 8.9–10.3)
Chloride: 104 mmol/L (ref 98–111)
Creatinine, Ser: 1.19 mg/dL (ref 0.61–1.24)
GFR, Estimated: >60 mL/min (ref >60)
Glucose, Bld: 95 mg/dL (ref 70–99)
Potassium: 3.7 mmol/L (ref 3.5–5.1)
Sodium: 139 mmol/L (ref 135–145)

## 2023-08-06 LAB — CBC
HCT: 41.7 % (ref 39.0–52.0)
Hemoglobin: 12.6 g/dL — ABNORMAL LOW (ref 13.0–17.0)
MCH: 26.4 pg (ref 26.0–34.0)
MCHC: 30.2 g/dL (ref 30.0–36.0)
MCV: 87.4 fL (ref 80.0–100.0)
Platelets: 308 10*3/uL (ref 150–400)
RBC: 4.77 MIL/uL (ref 4.22–5.81)
RDW: 13.7 % (ref 11.5–15.5)
WBC: 11.3 10*3/uL — ABNORMAL HIGH (ref 4.0–10.5)
nRBC: 0 % (ref 0.0–0.2)

## 2023-08-06 LAB — SURGICAL PCR SCREEN
MRSA, PCR: NEGATIVE
Staphylococcus aureus: NEGATIVE

## 2023-08-06 NOTE — Progress Notes (Signed)
 For Anesthesia: PCP - Allene Ivan, MD  Cardiologist -N/A  Bowel Prep reminder:  Chest x-ray - 01/20/23 EKG - 01/20/23 Stress Test -  ECHO -  Cardiac Cath -  Pacemaker/ICD device last checked: Pacemaker orders received: Device Rep notified:  Spinal Cord Stimulator:N/A  Sleep Study -Yes  CPAP - Yes  Fasting Blood Sugar - N/A Checks Blood Sugar __0___ times a day Date and result of last Hgb A1c-5.6: 05/08/23  Last dose of GLP1 agonist- Ozempic GLP1 instructions: To hold it after:08/12/23  Last dose of SGLT-2 inhibitors- N/A SGLT-2 instructions:   Blood Thinner Instructions:N/A Aspirin Instructions: Last Dose:  Activity level: Can go up a flight of stairs and activities of daily living without stopping and without chest pain and/or shortness of breath   Able to exercise without chest pain and/or shortness of breath  Anesthesia review: Hx: HTN,DIA,OSA(CPAP)  Patient denies shortness of breath, fever, cough and chest pain at PAT appointment   Patient verbalized understanding of instructions that were given to them at the PAT appointment. Patient was also instructed that they will need to review over the PAT instructions again at home before surgery.

## 2023-08-07 LAB — HEMOGLOBIN A1C
Hgb A1c MFr Bld: 5.9 % — ABNORMAL HIGH (ref 4.8–5.6)
Mean Plasma Glucose: 123 mg/dL

## 2023-08-08 ENCOUNTER — Encounter (HOSPITAL_COMMUNITY)

## 2023-08-15 DIAGNOSIS — M1611 Unilateral primary osteoarthritis, right hip: Secondary | ICD-10-CM | POA: Diagnosis present

## 2023-08-15 NOTE — H&P (Signed)
 TOTAL HIP ADMISSION H&P  Patient is admitted for right total hip arthroplasty.  Subjective:  Chief Complaint: right hip pain  HPI: Frank Howe, 48 y.o. male, has a history of pain and functional disability in the right hip(s) due to arthritis and patient has failed non-surgical conservative treatments for greater than 12 weeks to include NSAID's and/or analgesics, corticosteriod injections, flexibility and strengthening excercises, use of assistive devices, weight reduction as appropriate, and activity modification.  Onset of symptoms was gradual starting  several  years ago with gradually worsening course since that time.The patient noted no past surgery on the right hip(s).  Patient currently rates pain in the right hip at 10 out of 10 with activity. Patient has night pain, worsening of pain with activity and weight bearing, trendelenberg gait, pain that interfers with activities of daily living, and pain with passive range of motion. Patient has evidence of subchondral cysts, periarticular osteophytes, and joint space narrowing by imaging studies. This condition presents safety issues increasing the risk of falls.   There is no current active infection.  Patient Active Problem List   Diagnosis Date Noted   Osteoarthritis of right hip 08/15/2023   Arthritis of left hip 05/17/2023   Recurrent incisional hernia 03/01/2021   SBO (small bowel obstruction) (HCC) 01/15/2021   Essential hypertension 01/15/2021   Type 2 diabetes mellitus with obesity (HCC) 01/15/2021   Obesity, Class III, BMI 40-49.9 (morbid obesity) (HCC) 01/15/2021   Umbilical hernia with obstruction but no gangrene 02/11/2018   Partial small bowel obstruction (HCC) 09/13/2017   Gout, chronic 07/19/2015   OSA (obstructive sleep apnea) 07/19/2015   Obesity 06/29/2015   Elevated uric acid in blood 06/29/2015   Impaired fasting blood sugar 06/28/2015   Knee swelling 06/28/2015   Right knee pain 06/28/2015   Elevated blood  pressure reading without diagnosis of hypertension 06/28/2015   Routine general medical examination at a health care facility 08/21/2011   Past Medical History:  Diagnosis Date   Anxiety    Arthritis    Complication of anesthesia    Fingers numb after surgery   Depression    Diabetes mellitus type 2 in obese 01/15/2021   Elevated blood pressure reading without diagnosis of hypertension    Fatty liver    Gout    History of kidney stones    Hypertension    Impaired fasting blood sugar    Obesity    OSA (obstructive sleep apnea)     Past Surgical History:  Procedure Laterality Date   TOTAL HIP ARTHROPLASTY Left 05/21/2023   Procedure: LEFT TOTAL HIP ARTHROPLASTY ANTERIOR APPROACH;  Surgeon: Wendolyn Hamburger, MD;  Location: [facility];  Service: Orthopedics;  Laterality: Left;   UMBILICAL HERNIA REPAIR N/A 09/14/2017   Procedure: LAPAROSCOPIC UMBILICAL HERNIA;  Surgeon: Joyce Nixon, MD;  Location: [facility];  Service: General;  Laterality: N/A;   UMBILICAL HERNIA REPAIR N/A 02/11/2018   Procedure: LAPARASCOPIC REDO REPAIR OF RECURRENT UMBILICAL HERNIA WITH INSERTION OF MESH, LYSIS OF ADHESIONS;  Surgeon: Melvenia Stabs, MD;  Location: [facility];  Service: General;  Laterality: N/A;   XI ROBOTIC ASSISTED VENTRAL HERNIA N/A 03/01/2021   Procedure: ROBOTIC RECURRENT INCISIONAL HERNIA 9CMx9CM REPAIR WITH MESH AND REMOVAL OF INTRAPERITONEAL MESH;  Surgeon: Junie Olds, MD;  Location: [facility];  Service: General;  Laterality: N/A;    No current facility-administered medications for this encounter.   Current Outpatient Medications  Medication Sig Dispense Refill Last Dose/Taking   amLODipine  (NORVASC ) 10 MG tablet  Take 10 mg by mouth daily.   Taking   carvedilol (COREG) 3.125 MG tablet Take 3.125 mg by mouth 2 (two) times daily.   Taking   losartan -hydrochlorothiazide  (HYZAAR) 100-12.5 MG tablet Take 1 tablet by mouth daily.   Taking   Menthol , Topical Analgesic, (BENGAY EX) Apply 1  Application topically daily as needed (pain).   Taking As Needed   metFORMIN (GLUCOPHAGE-XR) 500 MG 24 hr tablet Take 500 mg by mouth daily.   Taking   NON FORMULARY Pt uses a c-pap nightly   Taking   oxyCODONE -acetaminophen  (PERCOCET/ROXICET) 5-325 MG tablet Take 1 tablet by mouth every 4 (four) hours as needed for severe pain (pain score 7-10). 30 tablet 0 Taking As Needed   OZEMPIC, 2 MG/DOSE, 8 MG/3ML SOPN Inject 2 mg into the skin every Thursday.   Taking   rosuvastatin (CRESTOR) 10 MG tablet Take 10 mg by mouth at bedtime.   Taking   traMADol  (ULTRAM ) 50 MG tablet Take 50 mg by mouth 2 (two) times daily.   Taking   Vitamin D, Ergocalciferol, (DRISDOL) 1.25 MG (50000 UNIT) CAPS capsule Take 50,000 Units by mouth 2 (two) times a week.   Taking   aspirin  EC 81 MG tablet Take 1 tablet (81 mg total) by mouth 2 (two) times daily. (Patient not taking: Reported on 08/03/2023) 60 tablet 0 Not Taking   colchicine  0.6 MG tablet Take 1 tablet (0.6 mg total) by mouth daily as needed (gout flare). (Patient not taking: Reported on 08/03/2023) 30 tablet 0 Not Taking   loperamide  (IMODIUM  A-D) 2 MG tablet Take 2 tablets (4 mg total) by mouth 4 (four) times daily as needed for diarrhea or loose stools. (Patient not taking: Reported on 05/04/2023) 30 tablet 0    ondansetron  (ZOFRAN -ODT) 8 MG disintegrating tablet Take 1 tablet (8 mg total) by mouth every 8 (eight) hours as needed for nausea or vomiting. (Patient not taking: Reported on 05/04/2023) 20 tablet 0    tiZANidine  (ZANAFLEX ) 2 MG tablet Take 1 tablet (2 mg total) by mouth every 6 (six) hours as needed. (Patient not taking: Reported on 08/03/2023) 60 tablet 0 Not Taking   No Known Allergies  Social History   Tobacco Use   Smoking status: Former    Current packs/day: 0.00    Average packs/day: 0.3 packs/day for 4.0 years (1.0 ttl pk-yrs)    Types: Cigarettes    Start date: 12/29/2010    Quit date: 12/29/2014    Years since quitting: 8.6   Smokeless  tobacco: Never   Tobacco comments:    once every 2 wk  Substance Use Topics   Alcohol use: Not Currently    Comment: occ    Family History  Problem Relation Age of Onset   Arthritis Mother    Hypertension Mother    Stroke Mother    Aneurysm Mother        died of brain aneurysm   Asthma Father    Cancer Father        ?   Arthritis Father    Heart disease Maternal Uncle    Diabetes Maternal Uncle    Alcohol abuse Neg Hx    Hyperlipidemia Neg Hx    Hearing loss Neg Hx    Kidney disease Neg Hx    Early death Neg Hx      Review of Systems  Constitutional: Negative.   HENT: Negative.    Eyes: Negative.   Respiratory:  Positive for shortness of breath.  Cardiovascular:  Positive for leg swelling.       HTN  Gastrointestinal:  Positive for diarrhea, nausea and vomiting.  Endocrine:       Diabetes  Genitourinary:  Positive for frequency.       Kidney stones  Musculoskeletal:  Positive for arthralgias and myalgias.  Allergic/Immunologic: Negative.   Neurological: Negative.   Hematological: Negative.   Psychiatric/Behavioral:  The patient is nervous/anxious.     Objective:  Physical Exam Constitutional:      Appearance: Normal appearance. He is obese.  HENT:     Head: Normocephalic and atraumatic.     Nose: Nose normal.  Eyes:     Pupils: Pupils are equal, round, and reactive to light.  Cardiovascular:     Pulses: Normal pulses.  Pulmonary:     Effort: Pulmonary effort is normal.  Musculoskeletal:        General: Tenderness present.     Cervical back: Normal range of motion and neck supple.     Comments:  Patient's left hip is able to internal and external rotate to approximately 30 each.  Calves are soft and non-tender.  Patient's right hip has significant stiffness and essentially cannot internally rotate.  Skin:    General: Skin is warm and dry.  Neurological:     General: No focal deficit present.     Mental Status: He is alert and oriented to person,  place, and time. Mental status is at baseline.  Psychiatric:        Mood and Affect: Mood normal.        Behavior: Behavior normal.        Thought Content: Thought content normal.        Judgment: Judgment normal.     Vital signs in last 24 hours:    Labs:   Estimated body mass index is 39.28 kg/m as calculated from the following:   Height as of 08/06/23: 5\' 9"  (1.753 m).   Weight as of 08/06/23: 120.7 kg.   Imaging Review Plain radiographs demonstrate  AP pelvis and crosstable lateral shows well placed well fixed DePuy pinnacle shell and active stem leg lengths are equal.  The contralateral right hip remains bone-on-bone with subchondral cysts and some early collapse of the femoral head.      Assessment/Plan:  End stage arthritis, right hip(s)  The patient history, physical examination, clinical judgement of the provider and imaging studies are consistent with end stage degenerative joint disease of the right hip(s) and total hip arthroplasty is deemed medically necessary. The treatment options including medical management, injection therapy, arthroscopy and arthroplasty were discussed at length. The risks and benefits of total hip arthroplasty were presented and reviewed. The risks due to aseptic loosening, infection, stiffness, dislocation/subluxation,  thromboembolic complications and other imponderables were discussed.  The patient acknowledged the explanation, agreed to proceed with the plan and consent was signed. Patient is being admitted for inpatient treatment for surgery, pain control, PT, OT, prophylactic antibiotics, VTE prophylaxis, progressive ambulation and ADL's and discharge planning.The patient is planning to be discharged home with home health services    Patient's anticipated LOS is less than 2 midnights, meeting these requirements: - Younger than 45 - Lives within 1 hour of care - Has a competent adult at home to recover with post-op recover - NO history  of  - Chronic pain requiring opiods  - Diabetes  - Coronary Artery Disease  - Heart failure  - Heart attack  - Stroke  - DVT/VTE  -  Cardiac arrhythmia  - Respiratory Failure/COPD  - Renal failure  - Anemia  - Advanced Liver disease

## 2023-08-19 MED ORDER — TRANEXAMIC ACID 1000 MG/10ML IV SOLN
2000.0000 mg | INTRAVENOUS | Status: DC
  Filled 2023-08-19: qty 20

## 2023-08-20 ENCOUNTER — Encounter (HOSPITAL_COMMUNITY): Payer: Self-pay | Admitting: Orthopedic Surgery

## 2023-08-20 ENCOUNTER — Other Ambulatory Visit: Payer: Self-pay

## 2023-08-20 ENCOUNTER — Encounter (HOSPITAL_COMMUNITY)
Admission: RE | Disposition: A | Discharge status: Home / Self Care | Payer: Self-pay | Source: Ambulatory Visit | Attending: Orthopedic Surgery

## 2023-08-20 ENCOUNTER — Ambulatory Visit (HOSPITAL_COMMUNITY)
Admission: RE | Admit: 2023-08-20 | Discharge: 2023-08-20 | Disposition: A | Discharge status: Home / Self Care | Source: Ambulatory Visit | Attending: Orthopedic Surgery | Admitting: Orthopedic Surgery

## 2023-08-20 ENCOUNTER — Ambulatory Visit (HOSPITAL_COMMUNITY): Payer: Self-pay | Admitting: Anesthesiology

## 2023-08-20 ENCOUNTER — Ambulatory Visit (HOSPITAL_COMMUNITY)

## 2023-08-20 DIAGNOSIS — M1611 Unilateral primary osteoarthritis, right hip: Secondary | ICD-10-CM | POA: Insufficient documentation

## 2023-08-20 HISTORY — PX: TOTAL HIP ARTHROPLASTY: SHX124

## 2023-08-20 LAB — GLUCOSE, CAPILLARY
Glucose-Capillary: 108 mg/dL — ABNORMAL HIGH (ref 70–99)
Glucose-Capillary: 106 mg/dL — ABNORMAL HIGH (ref 70–99)

## 2023-08-20 SURGERY — ARTHROPLASTY, HIP, TOTAL, ANTERIOR APPROACH
Anesthesia: Monitor Anesthesia Care | Site: Hip | Laterality: Right

## 2023-08-20 MED ORDER — BUPIVACAINE LIPOSOME 1.3 % IJ SUSP: 10.0000 mL | Freq: Once | INTRAMUSCULAR | Status: DC

## 2023-08-20 MED ORDER — LACTATED RINGERS IV BOLUS
500.0000 mL | Freq: Once | INTRAVENOUS | Status: AC
  Administered 2023-08-20: 500 mL via INTRAVENOUS

## 2023-08-20 MED ORDER — PROPOFOL 500 MG/50ML IV EMUL
INTRAVENOUS | Status: DC | PRN
  Administered 2023-08-20: 85 ug/kg/min via INTRAVENOUS

## 2023-08-20 MED ORDER — BUPIVACAINE IN DEXTROSE 0.75-8.25 % IT SOLN
INTRATHECAL | Status: DC | PRN
  Administered 2023-08-20: 2 mL via INTRATHECAL

## 2023-08-20 MED ORDER — ONDANSETRON HCL 4 MG/2ML IJ SOLN
INTRAMUSCULAR | Status: DC | PRN
  Administered 2023-08-20: 4 mg via INTRAVENOUS

## 2023-08-20 MED ORDER — TRANEXAMIC ACID-NACL 1000-0.7 MG/100ML-% IV SOLN
1000.0000 mg | INTRAVENOUS | Status: AC
  Administered 2023-08-20: 1000 mg via INTRAVENOUS
  Filled 2023-08-20: qty 100

## 2023-08-20 MED ORDER — DEXAMETHASONE SODIUM PHOSPHATE 10 MG/ML IJ SOLN
INTRAMUSCULAR | Status: AC
Start: 2023-08-20 — End: ?
  Filled 2023-08-20: qty 1

## 2023-08-20 MED ORDER — OXYCODONE-ACETAMINOPHEN 5-325 MG PO TABS: 1.0000 | ORAL_TABLET | ORAL | 0 refills | Status: AC | PRN

## 2023-08-20 MED ORDER — MIDAZOLAM HCL 2 MG/2ML IJ SOLN
INTRAMUSCULAR | Status: DC | PRN
  Administered 2023-08-20: 2 mg via INTRAVENOUS

## 2023-08-20 MED ORDER — PHENYLEPHRINE HCL (PRESSORS) 10 MG/ML IV SOLN
INTRAVENOUS | Status: AC
  Filled 2023-08-20: qty 1

## 2023-08-20 MED ORDER — CHLORHEXIDINE GLUCONATE 0.12 % MT SOLN
15.0000 mL | Freq: Once | OROMUCOSAL | Status: AC
  Administered 2023-08-20: 15 mL via OROMUCOSAL

## 2023-08-20 MED ORDER — LACTATED RINGERS IV BOLUS: 250.0000 mL | Freq: Once | INTRAVENOUS | Status: DC

## 2023-08-20 MED ORDER — ACETAMINOPHEN 500 MG PO TABS
ORAL_TABLET | ORAL | Status: AC
  Filled 2023-08-20: qty 2

## 2023-08-20 MED ORDER — POVIDONE-IODINE 10 % EX SWAB
2.0000 | Freq: Once | CUTANEOUS | Status: AC
  Administered 2023-08-20: 2 via TOPICAL

## 2023-08-20 MED ORDER — TRANEXAMIC ACID-NACL 1000-0.7 MG/100ML-% IV SOLN: 1000.0000 mg | Freq: Once | INTRAVENOUS | Status: DC

## 2023-08-20 MED ORDER — ACETAMINOPHEN 500 MG PO TABS
1000.0000 mg | ORAL_TABLET | Freq: Four times a day (QID) | ORAL | Status: DC
  Administered 2023-08-20: 1000 mg via ORAL

## 2023-08-20 MED ORDER — STERILE WATER FOR IRRIGATION IR SOLN
Status: DC | PRN
  Administered 2023-08-20: 1000 mL

## 2023-08-20 MED ORDER — FENTANYL CITRATE (PF) 100 MCG/2ML IJ SOLN
INTRAMUSCULAR | Status: DC | PRN
  Administered 2023-08-20: 100 ug via INTRAVENOUS

## 2023-08-20 MED ORDER — METHOCARBAMOL 500 MG PO TABS: 500.0000 mg | ORAL_TABLET | Freq: Four times a day (QID) | ORAL | Status: DC | PRN

## 2023-08-20 MED ORDER — ONDANSETRON HCL 4 MG/2ML IJ SOLN
INTRAMUSCULAR | Status: AC
  Filled 2023-08-20: qty 2

## 2023-08-20 MED ORDER — CEFAZOLIN SODIUM-DEXTROSE 3-4 GM/150ML-% IV SOLN
3.0000 g | INTRAVENOUS | Status: AC
  Administered 2023-08-20: 3 g via INTRAVENOUS
  Filled 2023-08-20: qty 150

## 2023-08-20 MED ORDER — BUPIVACAINE-EPINEPHRINE (PF) 0.25% -1:200000 IJ SOLN
INTRAMUSCULAR | Status: AC
  Filled 2023-08-20: qty 30

## 2023-08-20 MED ORDER — SODIUM CHLORIDE (PF) 0.9 % IJ SOLN
INTRAMUSCULAR | Status: AC
  Filled 2023-08-20: qty 50

## 2023-08-20 MED ORDER — FENTANYL CITRATE PF 50 MCG/ML IJ SOSY: 25.0000 ug | PREFILLED_SYRINGE | INTRAMUSCULAR | Status: DC | PRN

## 2023-08-20 MED ORDER — DEXAMETHASONE SODIUM PHOSPHATE 10 MG/ML IJ SOLN
INTRAMUSCULAR | Status: AC
  Filled 2023-08-20: qty 1

## 2023-08-20 MED ORDER — ORAL CARE MOUTH RINSE: 15.0000 mL | Freq: Once | OROMUCOSAL | Status: AC

## 2023-08-20 MED ORDER — BUPIVACAINE LIPOSOME 1.3 % IJ SUSP
INTRAMUSCULAR | Status: AC
  Filled 2023-08-20: qty 20

## 2023-08-20 MED ORDER — TRANEXAMIC ACID-NACL 1000-0.7 MG/100ML-% IV SOLN
INTRAVENOUS | Status: AC
  Administered 2023-08-20: 1000 mg
  Filled 2023-08-20: qty 100

## 2023-08-20 MED ORDER — OXYCODONE HCL 5 MG PO TABS
ORAL_TABLET | ORAL | Status: AC
  Filled 2023-08-20: qty 1

## 2023-08-20 MED ORDER — DEXAMETHASONE SODIUM PHOSPHATE 10 MG/ML IJ SOLN
INTRAMUSCULAR | Status: DC | PRN
  Administered 2023-08-20: 10 mg via INTRAVENOUS

## 2023-08-20 MED ORDER — LACTATED RINGERS IV SOLN: INTRAVENOUS | Status: DC

## 2023-08-20 MED ORDER — OXYCODONE HCL 5 MG PO TABS: 10.0000 mg | ORAL_TABLET | ORAL | Status: DC | PRN

## 2023-08-20 MED ORDER — 0.9 % SODIUM CHLORIDE (POUR BTL) OPTIME
TOPICAL | Status: DC | PRN
  Administered 2023-08-20: 1000 mL

## 2023-08-20 MED ORDER — MIDAZOLAM HCL 2 MG/2ML IJ SOLN
INTRAMUSCULAR | Status: AC
  Filled 2023-08-20: qty 2

## 2023-08-20 MED ORDER — METHOCARBAMOL 1000 MG/10ML IJ SOLN: 500.0000 mg | Freq: Four times a day (QID) | INTRAMUSCULAR | Status: DC | PRN

## 2023-08-20 MED ORDER — OXYCODONE HCL 5 MG/5ML PO SOLN: 5.0000 mg | Freq: Once | ORAL | Status: AC | PRN

## 2023-08-20 MED ORDER — METHOCARBAMOL 500 MG PO TABS
ORAL_TABLET | ORAL | Status: AC
  Filled 2023-08-20: qty 1

## 2023-08-20 MED ORDER — PROPOFOL 10 MG/ML IV BOLUS
INTRAVENOUS | Status: DC | PRN
  Administered 2023-08-20: 20 mg via INTRAVENOUS

## 2023-08-20 MED ORDER — TRANEXAMIC ACID 1000 MG/10ML IV SOLN
INTRAVENOUS | Status: DC | PRN
  Administered 2023-08-20: 2000 mg via TOPICAL

## 2023-08-20 MED ORDER — ONDANSETRON HCL 4 MG/2ML IJ SOLN: 4.0000 mg | Freq: Four times a day (QID) | INTRAMUSCULAR | Status: DC | PRN

## 2023-08-20 MED ORDER — SODIUM CHLORIDE (PF) 0.9 % IJ SOLN
INTRAMUSCULAR | Status: DC | PRN
  Administered 2023-08-20: 90 mL

## 2023-08-20 MED ORDER — BUPIVACAINE LIPOSOME 1.3 % IJ SUSP
INTRAMUSCULAR | Status: AC
  Filled 2023-08-20: qty 10

## 2023-08-20 MED ORDER — TIZANIDINE HCL 2 MG PO TABS: 2.0000 mg | ORAL_TABLET | Freq: Four times a day (QID) | ORAL | 0 refills | Status: AC | PRN

## 2023-08-20 MED ORDER — PROPOFOL 1000 MG/100ML IV EMUL
INTRAVENOUS | Status: AC
  Filled 2023-08-20: qty 100

## 2023-08-20 MED ORDER — OXYCODONE HCL 5 MG PO TABS
5.0000 mg | ORAL_TABLET | Freq: Once | ORAL | Status: AC | PRN
  Administered 2023-08-20: 5 mg via ORAL

## 2023-08-20 MED ORDER — INSULIN ASPART 100 UNIT/ML IJ SOLN: 0.0000 [IU] | INTRAMUSCULAR | Status: DC | PRN

## 2023-08-20 MED ORDER — FENTANYL CITRATE (PF) 100 MCG/2ML IJ SOLN
INTRAMUSCULAR | Status: AC
  Filled 2023-08-20: qty 2

## 2023-08-20 MED ORDER — OXYCODONE HCL 5 MG PO TABS: 5.0000 mg | ORAL_TABLET | ORAL | Status: DC | PRN

## 2023-08-20 MED ORDER — ASPIRIN 81 MG PO TBEC: 81.0000 mg | DELAYED_RELEASE_TABLET | Freq: Two times a day (BID) | ORAL | 0 refills | Status: AC

## 2023-08-20 SURGICAL SUPPLY — 36 items
BAG COUNTER SPONGE SURGICOUNT (BAG) ×1 IMPLANT
BAG DECANTER FOR FLEXI CONT (MISCELLANEOUS) ×2 IMPLANT
BLADE SAW SGTL 18X1.27X75 (BLADE) ×1 IMPLANT
COVER PERINEAL POST (MISCELLANEOUS) ×1 IMPLANT
COVER SURGICAL LIGHT HANDLE (MISCELLANEOUS) ×1 IMPLANT
CUP ACETBLR 52 OD 100 SERIES (Hips) IMPLANT
DRAPE STERI IOBAN 125X83 (DRAPES) ×1 IMPLANT
DRAPE U-SHAPE 47X51 STRL (DRAPES) ×2 IMPLANT
DRSG AQUACEL AG ADV 3.5X10 (GAUZE/BANDAGES/DRESSINGS) ×1 IMPLANT
DURAPREP 26ML APPLICATOR (WOUND CARE) ×1 IMPLANT
ELECT BLADE TIP CTD 4 INCH (ELECTRODE) ×1 IMPLANT
ELECT REM PT RETURN 15FT ADLT (MISCELLANEOUS) ×1 IMPLANT
ELIMINATOR HOLE APEX DEPUY (Hips) IMPLANT
FEMORAL STEM 12/14 TPR SZ4 HIP (Orthopedic Implant) IMPLANT
GLOVE BIO SURGEON STRL SZ7.5 (GLOVE) ×1 IMPLANT
GLOVE BIO SURGEON STRL SZ8.5 (GLOVE) ×1 IMPLANT
GLOVE BIOGEL PI IND STRL 8 (GLOVE) ×1 IMPLANT
GLOVE BIOGEL PI IND STRL 9 (GLOVE) ×1 IMPLANT
GOWN STRL REUS W/ TWL XL LVL3 (GOWN DISPOSABLE) ×2 IMPLANT
HEAD CERAMIC 36 PLUS5 (Hips) IMPLANT
HOLDER FOLEY CATH W/STRAP (MISCELLANEOUS) ×1 IMPLANT
KIT TURNOVER KIT A (KITS) IMPLANT
LINER NEUTRAL 52X36MM PLUS 4 (Liner) IMPLANT
MANIFOLD NEPTUNE II (INSTRUMENTS) ×1 IMPLANT
NDL HYPO 21X1.5 SAFETY (NEEDLE) ×2 IMPLANT
NEEDLE HYPO 21X1.5 SAFETY (NEEDLE) ×2 IMPLANT
NS IRRIG 1000ML POUR BTL (IV SOLUTION) ×1 IMPLANT
PACK ANTERIOR HIP CUSTOM (KITS) ×1 IMPLANT
SPIKE FLUID TRANSFER (MISCELLANEOUS) ×1 IMPLANT
SUT VIC AB 0 CT1 36 (SUTURE) ×1 IMPLANT
SUT VIC AB 1 CTX36XBRD ANBCTR (SUTURE) ×1 IMPLANT
SUT VIC AB 2-0 CT1 TAPERPNT 27 (SUTURE) ×1 IMPLANT
SUT VICRYL+ 3-0 36IN CT-1 (SUTURE) ×1 IMPLANT
SYR CONTROL 10ML LL (SYRINGE) ×3 IMPLANT
TRAY FOLEY MTR SLVR 16FR STAT (SET/KITS/TRAYS/PACK) IMPLANT
TUBE SUCTION HIGH CAP CLEAR NV (SUCTIONS) ×1 IMPLANT

## 2023-08-20 NOTE — Anesthesia Preprocedure Evaluation (Signed)
 Anesthesia Evaluation  Patient identified by MRN, date of birth, ID band Patient awake    Reviewed: Allergy & Precautions, H&P , NPO status , Patient's Chart, lab work & pertinent test results  Airway Mallampati: II   Neck ROM: full    Dental   Pulmonary sleep apnea , former smoker   breath sounds clear to auscultation       Cardiovascular hypertension,  Rhythm:regular Rate:Normal     Neuro/Psych  PSYCHIATRIC DISORDERS Anxiety Depression       GI/Hepatic   Endo/Other  diabetes, Type 2  Class 3 obesity  Renal/GU      Musculoskeletal  (+) Arthritis ,    Abdominal   Peds  Hematology   Anesthesia Other Findings   Reproductive/Obstetrics                             Anesthesia Physical Anesthesia Plan  ASA: 2  Anesthesia Plan: MAC and Spinal   Post-op Pain Management:    Induction: Intravenous  PONV Risk Score and Plan: 1 and Propofol  infusion, Ondansetron , Treatment may vary due to age or medical condition and Midazolam   Airway Management Planned: Simple Face Mask  Additional Equipment:   Intra-op Plan:   Post-operative Plan:   Informed Consent: I have reviewed the patients History and Physical, chart, labs and discussed the procedure including the risks, benefits and alternatives for the proposed anesthesia with the patient or authorized representative who has indicated his/her understanding and acceptance.     Dental advisory given  Plan Discussed with: CRNA, Anesthesiologist and Surgeon  Anesthesia Plan Comments:        Anesthesia Quick Evaluation

## 2023-08-20 NOTE — Discharge Instructions (Signed)

## 2023-08-20 NOTE — Transfer of Care (Signed)
 Immediate Anesthesia Transfer of Care Note  Patient: Frank Howe  Procedure(s) Performed: ARTHROPLASTY, HIP, TOTAL, ANTERIOR APPROACH (Right: Hip)  Patient Location: PACU  Anesthesia Type:Spinal  Level of Consciousness: awake and drowsy  Airway & Oxygen Therapy: Patient Spontanous Breathing and Patient connected to face mask oxygen  Post-op Assessment: Report given to RN and Post -op Vital signs reviewed and stable  Post vital signs: Reviewed and stable  Last Vitals:  Vitals Value Taken Time  BP 168/79 08/20/23 0927  Temp    Pulse 89 08/20/23 0929  Resp 17 08/20/23 0929  SpO2 94 % 08/20/23 0929  Vitals shown include unfiled device data.  Last Pain:  Vitals:   08/20/23 0636  TempSrc:   PainSc: 0-No pain      Patients Stated Pain Goal: 4 (08/20/23 0636)  Complications: No notable events documented.

## 2023-08-20 NOTE — Op Note (Signed)
 PATIENT ID:      Frank Howe  MRN:     [id number] DOB/AGE:    07/09/1975 / 48 y.o.  OPERATIVE REPORT   DATE OF PROCEDURE:  08/20/2023      PREOPERATIVE DIAGNOSIS:  RIGHT HIP OSTEOARTHRITIS                                                         POSTOPERATIVE DIAGNOSIS:  Same                                                         PROCEDURE: Anterior R total hip arthroplasty using a 52 mm DePuy Pinnacle  Cup, [employer], 0-degree polyethylene liner, a +5 mm x 36 mm ceramic head, a 4 hi Depuy Actis stem  SURGEON: Ilean Mall  ASSISTANT:   Bunny Caroli. [employer]  (present throughout entire procedure and necessary for timely completion of the procedure)   ANESTHESIA: Spinal, Exparel  133mg  injection BLOOD LOSS: 400 cc FLUID REPLACEMENT: 1800 cc crystalloid TRANEXAMIC ACID : 1gm IV, 2gm Topical COMPLICATIONS: none    INDICATIONS FOR PROCEDURE: A 48 y.o. year-old With  RIGHT HIP OSTEOARTHRITIS   for 3 years, x-rays show bone-on-bone arthritic changes, and osteophytes. Despite conservative measures with observation, anti-inflammatory medicine, narcotics, use of a cane, has severe unremitting pain and can ambulate only a few blocks before resting. Patient desires elective R total hip arthroplasty to decrease pain and increase function. The risks, benefits, and alternatives were discussed at length including but not limited to the risks of infection, bleeding, nerve injury, stiffness, blood clots, the need for revision surgery, cardiopulmonary complications, among others, and they were willing to proceed. Questions answered      PROCEDURE IN DETAIL: The patient was identified by armband, received preoperative IV antibiotics, in the holding area at [facility], taken to the operating room , appropriate anesthetic monitors were attached and anesthesia was induced with the patient on the gurney. HANA boots were applied to the feet, and the patient  was transferred to the HANA table  with a peroneal post and support underneath the non-operative leg. The operative lower extremity was then prepped and draped in the usual sterile fashion from just above the iliac crest to the knee. And a timeout procedure was performed. Bunny Caroli. Val[facility] was present and scrubbed throughout the case, critical for assistance with, positioning, exposure, retraction, instrumentation, and closure.Skin along incision area was injected with 10 cc of Exparel  solution. We then made a 15 cm incision along the interval at the leading edge of the tensor fascia lata of starting at 2 cm lateral to the ASIS. Small bleeders in the skin and subcutaneous tissue identified and cauterized we dissected down to the fascia and made an incision in the fascia allowing us  to elevate the fascia of the tensor muscle and exploited the interval between the rectus and the tensor fascia lata. A Cobra retractor was then placed along the superior neck of the femur. A cerebellar retractor was used to expose the interval between the tensor fascia lata and the rectus femoris.  We identified and cauterized the  ascending branch of the anterior circumflex artery. A second Cobra retractor along the inferior neck of the femur. A small Hohmann retractor was placed underneath the origin of the rectus femoris, giving us  good medial exposure. Using Ronguers fatty tissue was removed from in front of the anterior capsule. The capsule was then incised, starting out at the superior anterior rim of the acetabulum going laterally along the anterior neck. The capsule was then teed along the neck superiorly and inferiorly. Electrocautery was used to release capsule from the anterior and medial neck of the femur to allow external rotation. The cobra retractors were then placed along the inferior and superior neck. The hip was externally rotated to 40 degrees, traction applied and locked. We perform a standard neck cut with the oscillating saw and removed the femoral  head with a power corkscrew. We then placed a medium curved medium homan retractor in the cotyloid notch and standard cobra retractor posteriorly along the acetabular rim. Exposed labral tissue and osteophytes were then removed. We then sequentially reamed up to a 51 mm basket reamer obtaining good coverage in all quadrants, verified by C-arm imaging. Under C-arm control we then hammered into place a 52 mm Pinnacle cup in 45 of abduction and 15 of anteversion. The cup seated nicely and required no supplemental screws. We then placed a central hole Eliminator and a +4 mm, 0 polyethylene liner. The foot was then externally rotated to 130-140. The limb was extended and adducted to the floor, delivering the proximal femur up into the wound. A medium curved Hohmann retractor was placed over the greater trochanter and a long Homan retractor along the posterior femoral neck completing the exposure and lateralizing the femur. We then performed releases superiorly and and inferiorly of the capsule going back to the pirformis fossa superiorly and to the lesser trochanter inferiorly. We then entered the proximal femur with the box cutting offset chisel followed by, a canal sounder, the chili pepper and broaching up to a 4 broach. This seated nicely and we reamed the calcar. A trial reduction was performed with a 5 mm X 36 mm head.The limb lengths were checked by c-arm, and the hip was stable in 90 of external rotation. At this point the trial components removed and we hammered into place a hi  Offset # 4Actis stem with coating. A + 5 mm x 36 head was then hammered into place. The hip was reduced and final C-arm images obtained. The wound was thoroughly irrigated with normal saline solution. We repaired the ant capsule and the tensor fascia lot a with running 0 vicryl suture. the subcutaneous and subcuticular layers were closed with running 3-0 Vicryl suture followed by an Aquacil dressing. At this point the patient was  awaken and transferred to hospital gurney without difficulty.   Ilean Mall 08/20/2023, 6:30 AM

## 2023-08-20 NOTE — Anesthesia Procedure Notes (Addendum)
 Spinal  Patient location during procedure: OR Start time: 08/20/2023 7:18 AM End time: 08/20/2023 7:21 AM Reason for block: surgical anesthesia Staffing Performed: anesthesiologist  Anesthesiologist: Ellena Gurney, MD Performed by: Ellena Gurney, MD Authorized by: Ellena Gurney, MD   Preanesthetic Checklist Completed: patient identified, IV checked, risks and benefits discussed, surgical consent, monitors and equipment checked, pre-op evaluation and timeout performed Spinal Block Patient position: sitting Prep: DuraPrep Patient monitoring: cardiac monitor, continuous pulse ox and blood pressure Approach: midline Location: L3-4 Injection technique: single-shot Needle Needle type: Whitacre  Needle gauge: 25 G Needle length: 9 cm Assessment Sensory level: T10 Events: CSF return Additional Notes Functioning IV was confirmed and monitors were applied. Sterile prep and drape, including hand hygiene and sterile gloves were used. The patient was positioned and the spine was prepped. The skin was anesthetized with lidocaine .  Free flow of clear CSF was obtained prior to injecting local anesthetic into the CSF.  The spinal needle aspirated freely following injection.  The needle was carefully withdrawn.  The patient tolerated the procedure well.

## 2023-08-20 NOTE — Evaluation (Signed)
 Physical Therapy Evaluation Patient Details Name: Frank Howe MRN: [id number] DOB: 09/30/1975 Today's Date: 08/20/2023  History of Present Illness  Pt is 48 yo male  s/p R THA, anterior approach on 08/20/23 due to failure of conservative measures. Pt PMH includes but is not limited to: hernia, SBO, HTN, DM II, gout, OSA, anxiety, and arthritis, L THA 1/25  Clinical Impression  Pt is s/p THA resulting in the deficits listed below (see PT Problem List). At baseline pt is independent.  He had L THA in January and had returned to normal activity and work.  Pt has home support and DME.  PT with orders to see pt in PACU for possible SDDC.  Today, pt ambulated 11' with RW and CGA and performed stairs similar to home set up.  Pt was educated on hip precautions and verbalized understanding.  Reports he has f/u therapy at d/c additionally was provided with HEP.   Pt demonstrates safe gait & transfers in order to return home from PT perspective once discharged by MD.  While in hospital, will continue to benefit from PT for skilled therapy to advance mobility and exercises.            If plan is discharge home, recommend the following: A little help with walking and/or transfers;A little help with bathing/dressing/bathroom;Help with stairs or ramp for entrance   Can travel by private vehicle        Equipment Recommendations None recommended by PT  Recommendations for Other Services       Functional Status Assessment Patient has had a recent decline in their functional status and demonstrates the ability to make significant improvements in function in a reasonable and predictable amount of time.     Precautions / Restrictions Precautions Precautions: Fall;Anterior Hip Precaution Booklet Issued: Yes (comment) Precaution/Restrictions Comments: Provided anterior hip precautions handout; pt verbalized understanding Restrictions Weight Bearing Restrictions Per Provider Order: Yes RLE Weight Bearing  Per Provider Order: Weight bearing as tolerated      Mobility  Bed Mobility Overal bed mobility: Needs Assistance Bed Mobility: Supine to Sit     Supine to sit: Min assist     General bed mobility comments: Light min A for R LE; educated on no hip abduction; pt planning to sleep in recliner    Transfers Overall transfer level: Needs assistance Equipment used: Rolling walker (2 wheels) Transfers: Sit to/from Stand Sit to Stand: Contact guard assist           General transfer comment: cues for hand placement    Ambulation/Gait Ambulation/Gait assistance: Contact guard assist Gait Distance (Feet): 60 Feet Assistive device: Rolling walker (2 wheels) Gait Pattern/deviations: Step-to pattern, Decreased stride length Gait velocity: decreased     General Gait Details: min cues for sequencing  Stairs Stairs: Yes Stairs assistance: Contact guard assist Stair Management: Step to pattern, Forwards Number of Stairs: 3 General stair comments: Did 2 steps with bil rails then top platform step with RW.  4" height to simulate home  Wheelchair Mobility     Tilt Bed    Modified Rankin (Stroke Patients Only)       Balance Overall balance assessment: Needs assistance Sitting-balance support: No upper extremity supported Sitting balance-Leahy Scale: Good     Standing balance support: No upper extremity supported, Bilateral upper extremity supported Standing balance-Leahy Scale: Fair Standing balance comment: RW to ambulate but could static stand without AD  Pertinent Vitals/Pain Pain Assessment Pain Assessment: 0-10 Pain Score: 3  Pain Location: R hip Pain Descriptors / Indicators: Discomfort Pain Intervention(s): Limited activity within patient's tolerance, Monitored during session    Home Living Family/patient expects to be discharged to:: Private residence Living Arrangements: Spouse/significant other Available Help at  Discharge: Family Type of Home: House Home Access: Stairs to enter Entrance Stairs-Rails: None Entrance Stairs-Number of Steps: 1   Home Layout: Two level;Able to live on main level with bedroom/bathroom Home Equipment: Rolling Walker (2 wheels);Cane - single point      Prior Function Prior Level of Function : Independent/Modified Independent;Driving             Mobility Comments: Could ambulate without AD ADLs Comments: Had returned to work     Extremity/Trunk Assessment   Upper Extremity Assessment Upper Extremity Assessment: Overall WFL for tasks assessed    Lower Extremity Assessment Lower Extremity Assessment: LLE deficits/detail;RLE deficits/detail RLE Deficits / Details: Expected post op changes; ROM WFL; MMT ankle 5/5, knee 3/5, hip 1/5 LLE Deficits / Details: ROM WFL; MMT 5/5    Cervical / Trunk Assessment Cervical / Trunk Assessment: Normal  Communication        Cognition Arousal: Alert Behavior During Therapy: WFL for tasks assessed/performed   PT - Cognitive impairments: No apparent impairments                                 Cueing       General Comments General comments (skin integrity, edema, etc.): Orders for anterior precautions.  Pt reports did not have in January after his L THA.  RN clarified with surgeon - pt is to have anterior precautions.  Educated and provided handout and crossed out hip abd and hip ext exercises.  Educated on safe ice use, no pivots, car transfers, and TED hose during day. Also, encouraged walking every 1-2 hours during day for 5-10 mins. Educated on HEP with focus on mobility the first week. Discussed doing exercises within pain control and if pain increasing could decreased ROM, reps, and stop exercises as needed. Encouraged to perform ankle pumps frequently for blood flow.    Exercises Total Joint Exercises Ankle Circles/Pumps: AROM, Both, 10 reps, Supine Quad Sets: AROM, Both, 5 reps, Supine Heel  Slides: AAROM, 5 reps, Right, Supine Long Arc Quad: AROM, Right, 5 reps, Seated Other Exercises Other Exercises: Standing , AROM, R LE, 5 reps with CGA and RW: marching and hamstring curl   Assessment/Plan    PT Assessment Patient needs continued PT services  PT Problem List Decreased strength;Decreased range of motion;Decreased activity tolerance;Decreased balance;Decreased mobility;Decreased knowledge of use of DME       PT Treatment Interventions DME instruction;Therapeutic exercise;Gait training;Stair training;Functional mobility training;Therapeutic activities;Patient/family education;Modalities;Balance training    PT Goals (Current goals can be found in the Care Plan section)  Acute Rehab PT Goals Patient Stated Goal: return home PT Goal Formulation: With patient/family Time For Goal Achievement: 09/03/23 Potential to Achieve Goals: Good    Frequency 7X/week     Co-evaluation               AM-PAC PT "6 Clicks" Mobility  Outcome Measure Help needed turning from your back to your side while in a flat bed without using bedrails?: A Little Help needed moving from lying on your back to sitting on the side of a flat bed without using bedrails?: A Little Help needed  moving to and from a bed to a chair (including a wheelchair)?: A Little Help needed standing up from a chair using your arms (e.g., wheelchair or bedside chair)?: A Little Help needed to walk in hospital room?: A Little Help needed climbing 3-5 steps with a railing? : A Little 6 Click Score: 18    End of Session Equipment Utilized During Treatment: Gait belt Activity Tolerance: Patient tolerated treatment well Patient left: in chair;with family/visitor present (PACU) Nurse Communication: Mobility status PT Visit Diagnosis: Other abnormalities of gait and mobility (R26.89);Muscle weakness (generalized) (M62.81)    Time: 2956-2130 PT Time Calculation (min) (ACUTE ONLY): 30 min   Charges:   PT  Evaluation $PT Eval Low Complexity: 1 Low PT Treatments $Gait Training: 8-22 mins PT General Charges $$ ACUTE PT VISIT: 1 Visit         Cyd Dowse, PT Acute Rehab [facility] [phone]   Carolynn Citrin 08/20/2023, 2:53 PM

## 2023-08-20 NOTE — Interval H&P Note (Signed)
 History and Physical Interval Note:  08/20/2023 6:28 AM  Frank Howe  has presented today for surgery, with the diagnosis of RIGHT HIP OSTEOARTHRITIS.  The various methods of treatment have been discussed with the patient and family. After consideration of risks, benefits and other options for treatment, the patient has consented to  Procedure(s) with comments: ARTHROPLASTY, HIP, TOTAL, ANTERIOR APPROACH (Right) - RIGHT TOTAL HIP ARTHROPLASTY ANTERIOR as a surgical intervention.  The patient's history has been reviewed, patient examined, no change in status, stable for surgery.  I have reviewed the patient's chart and labs.  Questions were answered to the patient's satisfaction.     Ilean Mall

## 2023-08-21 ENCOUNTER — Encounter (HOSPITAL_COMMUNITY): Payer: Self-pay | Admitting: Orthopedic Surgery

## 2023-08-21 NOTE — Anesthesia Postprocedure Evaluation (Signed)
 Anesthesia Post Note  Patient: Frank Howe  Procedure(s) Performed: ARTHROPLASTY, HIP, TOTAL, ANTERIOR APPROACH (Right: Hip)     Patient location during evaluation: PACU Anesthesia Type: MAC and Spinal Level of consciousness: oriented and awake and alert Pain management: pain level controlled Vital Signs Assessment: post-procedure vital signs reviewed and stable Respiratory status: spontaneous breathing, respiratory function stable and patient connected to nasal cannula oxygen Cardiovascular status: blood pressure returned to baseline and stable Postop Assessment: no headache, no backache and no apparent nausea or vomiting Anesthetic complications: no   No notable events documented.  Last Vitals:  Vitals:   08/20/23 1130 08/20/23 1200  BP: (!) 163/87 (!) 172/94  Pulse: 66 82  Resp: 17   Temp:    SpO2: 96% 93%    Last Pain:  Vitals:   08/20/23 1130  TempSrc:   PainSc: 3                  Dariyon Urquilla S

## 2023-08-23 ENCOUNTER — Emergency Department (HOSPITAL_COMMUNITY)

## 2023-08-23 ENCOUNTER — Other Ambulatory Visit: Payer: Self-pay

## 2023-08-23 ENCOUNTER — Inpatient Hospital Stay (HOSPITAL_COMMUNITY)
Admission: EM | Admit: 2023-08-23 | Discharge: 2023-08-25 | DRG: 920 | Disposition: A | Discharge status: Home / Self Care | Attending: Family Medicine | Admitting: Family Medicine

## 2023-08-23 DIAGNOSIS — F32A Depression, unspecified: Secondary | ICD-10-CM | POA: Diagnosis present

## 2023-08-23 DIAGNOSIS — R21 Rash and other nonspecific skin eruption: Principal | ICD-10-CM

## 2023-08-23 DIAGNOSIS — Z8261 Family history of arthritis: Secondary | ICD-10-CM

## 2023-08-23 DIAGNOSIS — E669 Obesity, unspecified: Secondary | ICD-10-CM | POA: Diagnosis present

## 2023-08-23 DIAGNOSIS — Z7985 Long-term (current) use of injectable non-insulin antidiabetic drugs: Secondary | ICD-10-CM

## 2023-08-23 DIAGNOSIS — E66813 Obesity, class 3: Secondary | ICD-10-CM | POA: Diagnosis present

## 2023-08-23 DIAGNOSIS — I10 Essential (primary) hypertension: Secondary | ICD-10-CM | POA: Diagnosis present

## 2023-08-23 DIAGNOSIS — Z833 Family history of diabetes mellitus: Secondary | ICD-10-CM

## 2023-08-23 DIAGNOSIS — S70321A Blister (nonthermal), right thigh, initial encounter: Principal | ICD-10-CM | POA: Diagnosis present

## 2023-08-23 DIAGNOSIS — Y838 Other surgical procedures as the cause of abnormal reaction of the patient, or of later complication, without mention of misadventure at the time of the procedure: Secondary | ICD-10-CM | POA: Diagnosis present

## 2023-08-23 DIAGNOSIS — Z823 Family history of stroke: Secondary | ICD-10-CM

## 2023-08-23 DIAGNOSIS — R748 Abnormal levels of other serum enzymes: Secondary | ICD-10-CM | POA: Diagnosis present

## 2023-08-23 DIAGNOSIS — F419 Anxiety disorder, unspecified: Secondary | ICD-10-CM | POA: Diagnosis present

## 2023-08-23 DIAGNOSIS — M7989 Other specified soft tissue disorders: Secondary | ICD-10-CM | POA: Diagnosis present

## 2023-08-23 DIAGNOSIS — G4733 Obstructive sleep apnea (adult) (pediatric): Secondary | ICD-10-CM | POA: Diagnosis present

## 2023-08-23 DIAGNOSIS — X58XXXA Exposure to other specified factors, initial encounter: Secondary | ICD-10-CM | POA: Diagnosis present

## 2023-08-23 DIAGNOSIS — Z6841 Body Mass Index (BMI) 40.0 and over, adult: Secondary | ICD-10-CM

## 2023-08-23 DIAGNOSIS — I82413 Acute embolism and thrombosis of femoral vein, bilateral: Secondary | ICD-10-CM | POA: Diagnosis present

## 2023-08-23 DIAGNOSIS — Z8249 Family history of ischemic heart disease and other diseases of the circulatory system: Secondary | ICD-10-CM

## 2023-08-23 DIAGNOSIS — M1A9XX0 Chronic gout, unspecified, without tophus (tophi): Secondary | ICD-10-CM | POA: Diagnosis present

## 2023-08-23 DIAGNOSIS — E876 Hypokalemia: Secondary | ICD-10-CM | POA: Diagnosis present

## 2023-08-23 DIAGNOSIS — E440 Moderate protein-calorie malnutrition: Secondary | ICD-10-CM | POA: Diagnosis present

## 2023-08-23 DIAGNOSIS — L03115 Cellulitis of right lower limb: Secondary | ICD-10-CM | POA: Diagnosis present

## 2023-08-23 DIAGNOSIS — E1151 Type 2 diabetes mellitus with diabetic peripheral angiopathy without gangrene: Secondary | ICD-10-CM | POA: Diagnosis present

## 2023-08-23 DIAGNOSIS — L7682 Other postprocedural complications of skin and subcutaneous tissue: Principal | ICD-10-CM | POA: Diagnosis present

## 2023-08-23 DIAGNOSIS — Z79899 Other long term (current) drug therapy: Secondary | ICD-10-CM

## 2023-08-23 DIAGNOSIS — Z791 Long term (current) use of non-steroidal anti-inflammatories (NSAID): Secondary | ICD-10-CM

## 2023-08-23 DIAGNOSIS — K760 Fatty (change of) liver, not elsewhere classified: Secondary | ICD-10-CM | POA: Diagnosis present

## 2023-08-23 DIAGNOSIS — E1169 Type 2 diabetes mellitus with other specified complication: Secondary | ICD-10-CM | POA: Diagnosis present

## 2023-08-23 DIAGNOSIS — Z87891 Personal history of nicotine dependence: Secondary | ICD-10-CM

## 2023-08-23 DIAGNOSIS — R238 Other skin changes: Secondary | ICD-10-CM | POA: Diagnosis present

## 2023-08-23 DIAGNOSIS — Z7984 Long term (current) use of oral hypoglycemic drugs: Secondary | ICD-10-CM

## 2023-08-23 DIAGNOSIS — Z87442 Personal history of urinary calculi: Secondary | ICD-10-CM

## 2023-08-23 DIAGNOSIS — Z825 Family history of asthma and other chronic lower respiratory diseases: Secondary | ICD-10-CM

## 2023-08-23 DIAGNOSIS — Z9889 Other specified postprocedural states: Secondary | ICD-10-CM

## 2023-08-23 DIAGNOSIS — Z96643 Presence of artificial hip joint, bilateral: Secondary | ICD-10-CM | POA: Diagnosis present

## 2023-08-23 LAB — CBC WITH DIFFERENTIAL/PLATELET
Abs Immature Granulocytes: 0.05 10*3/uL (ref 0.00–0.07)
Basophils Absolute: 0 10*3/uL (ref 0.0–0.1)
Basophils Relative: 0 %
Eosinophils Absolute: 0.2 10*3/uL (ref 0.0–0.5)
Eosinophils Relative: 1 %
HCT: 35.8 % — ABNORMAL LOW (ref 39.0–52.0)
Hemoglobin: 11.1 g/dL — ABNORMAL LOW (ref 13.0–17.0)
Immature Granulocytes: 0 %
Lymphocytes Relative: 32 %
Lymphs Abs: 4.5 10*3/uL — ABNORMAL HIGH (ref 0.7–4.0)
MCH: 26.1 pg (ref 26.0–34.0)
MCHC: 31 g/dL (ref 30.0–36.0)
MCV: 84.2 fL (ref 80.0–100.0)
Monocytes Absolute: 0.7 10*3/uL (ref 0.1–1.0)
Monocytes Relative: 5 %
Neutro Abs: 8.3 10*3/uL — ABNORMAL HIGH (ref 1.7–7.7)
Neutrophils Relative %: 62 %
Platelets: 265 10*3/uL (ref 150–400)
RBC: 4.25 MIL/uL (ref 4.22–5.81)
RDW: 13.9 % (ref 11.5–15.5)
WBC: 13.8 10*3/uL — ABNORMAL HIGH (ref 4.0–10.5)
nRBC: 0 % (ref 0.0–0.2)

## 2023-08-23 LAB — COMPREHENSIVE METABOLIC PANEL WITH GFR
ALT: 25 U/L (ref 0–44)
AST: 23 U/L (ref 15–41)
Albumin: 3.1 g/dL — ABNORMAL LOW (ref 3.5–5.0)
Alkaline Phosphatase: 75 U/L (ref 38–126)
Anion gap: 11 (ref 5–15)
BUN: 17 mg/dL (ref 6–20)
CO2: 24 mmol/L (ref 22–32)
Calcium: 8.3 mg/dL — ABNORMAL LOW (ref 8.9–10.3)
Chloride: 100 mmol/L (ref 98–111)
Creatinine, Ser: 0.97 mg/dL (ref 0.61–1.24)
GFR, Estimated: >60 mL/min (ref >60)
Glucose, Bld: 157 mg/dL — ABNORMAL HIGH (ref 70–99)
Potassium: 3.2 mmol/L — ABNORMAL LOW (ref 3.5–5.1)
Sodium: 135 mmol/L (ref 135–145)
Total Bilirubin: 0.8 mg/dL (ref 0.0–1.2)
Total Protein: 6.9 g/dL (ref 6.5–8.1)

## 2023-08-23 LAB — CK: Total CK: 499 U/L — ABNORMAL HIGH (ref 49–397)

## 2023-08-23 LAB — LACTIC ACID, PLASMA: Lactic Acid, Venous: 1 mmol/L (ref 0.5–1.9)

## 2023-08-23 MED ORDER — PIPERACILLIN-TAZOBACTAM 3.375 G IVPB: 3.3750 g | Freq: Three times a day (TID) | INTRAVENOUS | Status: DC

## 2023-08-23 MED ORDER — OXYCODONE-ACETAMINOPHEN 5-325 MG PO TABS
1.0000 | ORAL_TABLET | Freq: Once | ORAL | Status: AC
  Administered 2023-08-23: 1 via ORAL
  Filled 2023-08-23: qty 1

## 2023-08-23 MED ORDER — VANCOMYCIN HCL 2000 MG/400ML IV SOLN
2000.0000 mg | Freq: Once | INTRAVENOUS | Status: AC
  Administered 2023-08-24: 2000 mg via INTRAVENOUS
  Filled 2023-08-23: qty 400

## 2023-08-23 NOTE — ED Provider Triage Note (Signed)
 Emergency Medicine Provider Triage Evaluation Note  Frank Howe , a 48 y.o. male  was evaluated in triage.  Pt complains of rash on his right thigh that appeared earlier today.  States it is not itchy but is somewhat painful.  He did recently have a hip replacement surgery on the same side on 08/20/2023.  No calf pain or swelling.  Denies any numbness or tingling in that leg.  Denies fever or chills.  Review of Systems  Positive: As above Negative: As above  Physical Exam  BP (!) 147/79   Pulse 89   Temp 98.3 F (36.8 C) (Oral)   Resp 18   SpO2 95%  Gen:   Awake, no distress   Resp:  Normal effort  MSK:   Moves extremities without difficulty  Other:  Macular appearing slightly ecchymotic rash over the anterior right thigh. No right calf tenderness to palpation  Medical Decision Making  Medically screening exam initiated at 9:36 PM.  Appropriate orders placed.  Aum Virola was informed that the remainder of the evaluation will be completed by another provider, this initial triage assessment does not replace that evaluation, and the importance of remaining in the ED until their evaluation is complete.     Rexie Catena, PA-C 08/23/23 2138

## 2023-08-23 NOTE — ED Triage Notes (Signed)
 Pt arrives ambulatory to triage with walker - post right hip surgery on 4/28. Pt reports concern for right lower leg swelling that started today around 2 pm. No abnormal drainage. No chills/fevers. Sensation intact. P

## 2023-08-24 ENCOUNTER — Inpatient Hospital Stay (HOSPITAL_COMMUNITY)

## 2023-08-24 ENCOUNTER — Emergency Department (HOSPITAL_COMMUNITY)

## 2023-08-24 ENCOUNTER — Telehealth (HOSPITAL_COMMUNITY): Payer: Self-pay | Admitting: Pharmacy Technician

## 2023-08-24 ENCOUNTER — Other Ambulatory Visit (HOSPITAL_COMMUNITY): Payer: Self-pay

## 2023-08-24 DIAGNOSIS — I10 Essential (primary) hypertension: Secondary | ICD-10-CM | POA: Diagnosis present

## 2023-08-24 DIAGNOSIS — E876 Hypokalemia: Secondary | ICD-10-CM | POA: Diagnosis present

## 2023-08-24 DIAGNOSIS — Z8249 Family history of ischemic heart disease and other diseases of the circulatory system: Secondary | ICD-10-CM | POA: Diagnosis not present

## 2023-08-24 DIAGNOSIS — M7989 Other specified soft tissue disorders: Secondary | ICD-10-CM | POA: Diagnosis present

## 2023-08-24 DIAGNOSIS — E66813 Obesity, class 3: Secondary | ICD-10-CM | POA: Diagnosis present

## 2023-08-24 DIAGNOSIS — Z825 Family history of asthma and other chronic lower respiratory diseases: Secondary | ICD-10-CM | POA: Diagnosis not present

## 2023-08-24 DIAGNOSIS — R238 Other skin changes: Secondary | ICD-10-CM | POA: Diagnosis present

## 2023-08-24 DIAGNOSIS — Z87891 Personal history of nicotine dependence: Secondary | ICD-10-CM | POA: Diagnosis not present

## 2023-08-24 DIAGNOSIS — Z79899 Other long term (current) drug therapy: Secondary | ICD-10-CM | POA: Diagnosis not present

## 2023-08-24 DIAGNOSIS — Z8261 Family history of arthritis: Secondary | ICD-10-CM | POA: Diagnosis not present

## 2023-08-24 DIAGNOSIS — E1151 Type 2 diabetes mellitus with diabetic peripheral angiopathy without gangrene: Secondary | ICD-10-CM | POA: Diagnosis present

## 2023-08-24 DIAGNOSIS — E440 Moderate protein-calorie malnutrition: Secondary | ICD-10-CM | POA: Diagnosis present

## 2023-08-24 DIAGNOSIS — R609 Edema, unspecified: Secondary | ICD-10-CM

## 2023-08-24 DIAGNOSIS — K760 Fatty (change of) liver, not elsewhere classified: Secondary | ICD-10-CM | POA: Diagnosis present

## 2023-08-24 DIAGNOSIS — Y838 Other surgical procedures as the cause of abnormal reaction of the patient, or of later complication, without mention of misadventure at the time of the procedure: Secondary | ICD-10-CM | POA: Diagnosis present

## 2023-08-24 DIAGNOSIS — S70321A Blister (nonthermal), right thigh, initial encounter: Secondary | ICD-10-CM | POA: Diagnosis present

## 2023-08-24 DIAGNOSIS — E1169 Type 2 diabetes mellitus with other specified complication: Secondary | ICD-10-CM | POA: Diagnosis present

## 2023-08-24 DIAGNOSIS — R0602 Shortness of breath: Secondary | ICD-10-CM | POA: Diagnosis not present

## 2023-08-24 DIAGNOSIS — M1A9XX0 Chronic gout, unspecified, without tophus (tophi): Secondary | ICD-10-CM | POA: Diagnosis present

## 2023-08-24 DIAGNOSIS — R9431 Abnormal electrocardiogram [ECG] [EKG]: Secondary | ICD-10-CM | POA: Diagnosis not present

## 2023-08-24 DIAGNOSIS — L03115 Cellulitis of right lower limb: Secondary | ICD-10-CM | POA: Diagnosis present

## 2023-08-24 DIAGNOSIS — Z833 Family history of diabetes mellitus: Secondary | ICD-10-CM | POA: Diagnosis not present

## 2023-08-24 DIAGNOSIS — Z6841 Body Mass Index (BMI) 40.0 and over, adult: Secondary | ICD-10-CM | POA: Diagnosis not present

## 2023-08-24 DIAGNOSIS — F32A Depression, unspecified: Secondary | ICD-10-CM | POA: Diagnosis present

## 2023-08-24 DIAGNOSIS — Z7984 Long term (current) use of oral hypoglycemic drugs: Secondary | ICD-10-CM | POA: Diagnosis not present

## 2023-08-24 DIAGNOSIS — I82413 Acute embolism and thrombosis of femoral vein, bilateral: Secondary | ICD-10-CM | POA: Diagnosis present

## 2023-08-24 DIAGNOSIS — G4733 Obstructive sleep apnea (adult) (pediatric): Secondary | ICD-10-CM | POA: Diagnosis present

## 2023-08-24 DIAGNOSIS — Z823 Family history of stroke: Secondary | ICD-10-CM | POA: Diagnosis not present

## 2023-08-24 DIAGNOSIS — X58XXXA Exposure to other specified factors, initial encounter: Secondary | ICD-10-CM | POA: Diagnosis present

## 2023-08-24 DIAGNOSIS — R748 Abnormal levels of other serum enzymes: Secondary | ICD-10-CM | POA: Diagnosis present

## 2023-08-24 LAB — GLUCOSE, CAPILLARY
Glucose-Capillary: 98 mg/dL (ref 70–99)
Glucose-Capillary: 141 mg/dL — ABNORMAL HIGH (ref 70–99)
Glucose-Capillary: 100 mg/dL — ABNORMAL HIGH (ref 70–99)

## 2023-08-24 LAB — COMPREHENSIVE METABOLIC PANEL WITH GFR
ALT: 22 U/L (ref 0–44)
AST: 20 U/L (ref 15–41)
Albumin: 2.9 g/dL — ABNORMAL LOW (ref 3.5–5.0)
Alkaline Phosphatase: 73 U/L (ref 38–126)
Anion gap: 9 (ref 5–15)
BUN: 16 mg/dL (ref 6–20)
CO2: 27 mmol/L (ref 22–32)
Calcium: 8.3 mg/dL — ABNORMAL LOW (ref 8.9–10.3)
Chloride: 102 mmol/L (ref 98–111)
Creatinine, Ser: 1.06 mg/dL (ref 0.61–1.24)
GFR, Estimated: >60 mL/min (ref >60)
Glucose, Bld: 105 mg/dL — ABNORMAL HIGH (ref 70–99)
Potassium: 3.5 mmol/L (ref 3.5–5.1)
Sodium: 138 mmol/L (ref 135–145)
Total Bilirubin: 1 mg/dL (ref 0.0–1.2)
Total Protein: 6.4 g/dL — ABNORMAL LOW (ref 6.5–8.1)

## 2023-08-24 LAB — HEPARIN LEVEL (UNFRACTIONATED)
Heparin Unfractionated: 0.36 [IU]/mL (ref 0.30–0.70)
Heparin Unfractionated: 0.29 [IU]/mL — ABNORMAL LOW (ref 0.30–0.70)

## 2023-08-24 LAB — CK: Total CK: 410 U/L — ABNORMAL HIGH (ref 49–397)

## 2023-08-24 LAB — LACTIC ACID, PLASMA: Lactic Acid, Venous: 1 mmol/L (ref 0.5–1.9)

## 2023-08-24 LAB — MAGNESIUM: Magnesium: 1.7 mg/dL (ref 1.7–2.4)

## 2023-08-24 MED ORDER — CLINDAMYCIN PHOSPHATE 600 MG/50ML IV SOLN
600.0000 mg | Freq: Three times a day (TID) | INTRAVENOUS | Status: DC
  Filled 2023-08-24: qty 50

## 2023-08-24 MED ORDER — HEPARIN BOLUS VIA INFUSION
4000.0000 [IU] | Freq: Once | INTRAVENOUS | Status: AC
  Administered 2023-08-24: 4000 [IU] via INTRAVENOUS
  Filled 2023-08-24: qty 4000

## 2023-08-24 MED ORDER — CARVEDILOL 3.125 MG PO TABS
3.1250 mg | ORAL_TABLET | Freq: Two times a day (BID) | ORAL | Status: DC
  Administered 2023-08-24 – 2023-08-25 (×3): 3.125 mg via ORAL
  Filled 2023-08-24 (×3): qty 1

## 2023-08-24 MED ORDER — HYDROMORPHONE HCL 1 MG/ML IJ SOLN
0.5000 mg | INTRAMUSCULAR | Status: DC | PRN
  Administered 2023-08-24: 0.5 mg via INTRAVENOUS
  Filled 2023-08-24: qty 0.5

## 2023-08-24 MED ORDER — LOSARTAN POTASSIUM-HCTZ 100-12.5 MG PO TABS: 1.0000 | ORAL_TABLET | Freq: Every day | ORAL | Status: DC

## 2023-08-24 MED ORDER — SODIUM CHLORIDE 0.9 % IV SOLN
2.0000 g | Freq: Three times a day (TID) | INTRAVENOUS | Status: DC
  Administered 2023-08-24 – 2023-08-25 (×4): 2 g via INTRAVENOUS
  Filled 2023-08-24 (×4): qty 12.5

## 2023-08-24 MED ORDER — LOSARTAN POTASSIUM 50 MG PO TABS
100.0000 mg | ORAL_TABLET | Freq: Every day | ORAL | Status: DC
  Administered 2023-08-24 – 2023-08-25 (×2): 100 mg via ORAL
  Filled 2023-08-24 (×2): qty 2

## 2023-08-24 MED ORDER — ACETAMINOPHEN 650 MG RE SUPP: 650.0000 mg | Freq: Four times a day (QID) | RECTAL | Status: DC | PRN

## 2023-08-24 MED ORDER — NALOXONE HCL 0.4 MG/ML IJ SOLN: 0.4000 mg | INTRAMUSCULAR | Status: DC | PRN

## 2023-08-24 MED ORDER — METFORMIN HCL ER 500 MG PO TB24: 500.0000 mg | ORAL_TABLET | Freq: Every day | ORAL | Status: DC

## 2023-08-24 MED ORDER — VANCOMYCIN HCL IN DEXTROSE 1-5 GM/200ML-% IV SOLN
1000.0000 mg | Freq: Two times a day (BID) | INTRAVENOUS | Status: DC
  Administered 2023-08-24 – 2023-08-25 (×3): 1000 mg via INTRAVENOUS
  Filled 2023-08-24 (×3): qty 200

## 2023-08-24 MED ORDER — ACETAMINOPHEN 325 MG PO TABS: 650.0000 mg | ORAL_TABLET | Freq: Four times a day (QID) | ORAL | Status: DC | PRN

## 2023-08-24 MED ORDER — POTASSIUM CHLORIDE CRYS ER 20 MEQ PO TBCR
40.0000 meq | EXTENDED_RELEASE_TABLET | Freq: Once | ORAL | Status: AC
  Administered 2023-08-24: 40 meq via ORAL
  Filled 2023-08-24: qty 2

## 2023-08-24 MED ORDER — SODIUM CHLORIDE 0.9 % IV SOLN: INTRAVENOUS | Status: AC

## 2023-08-24 MED ORDER — HYDROCHLOROTHIAZIDE 12.5 MG PO TABS
12.5000 mg | ORAL_TABLET | Freq: Every day | ORAL | Status: DC
  Administered 2023-08-24 – 2023-08-25 (×2): 12.5 mg via ORAL
  Filled 2023-08-24 (×2): qty 1

## 2023-08-24 MED ORDER — IOHEXOL 350 MG/ML SOLN
100.0000 mL | Freq: Once | INTRAVENOUS | Status: AC | PRN
  Administered 2023-08-24: 100 mL via INTRAVENOUS

## 2023-08-24 MED ORDER — AMLODIPINE BESYLATE 5 MG PO TABS
10.0000 mg | ORAL_TABLET | Freq: Every day | ORAL | Status: DC
  Administered 2023-08-24 – 2023-08-25 (×2): 10 mg via ORAL
  Filled 2023-08-24 (×2): qty 2

## 2023-08-24 MED ORDER — MELATONIN 3 MG PO TABS: 3.0000 mg | ORAL_TABLET | Freq: Every evening | ORAL | Status: DC | PRN

## 2023-08-24 MED ORDER — CLINDAMYCIN PHOSPHATE 900 MG/50ML IV SOLN
900.0000 mg | Freq: Once | INTRAVENOUS | Status: AC
  Administered 2023-08-24: 900 mg via INTRAVENOUS
  Filled 2023-08-24: qty 50

## 2023-08-24 MED ORDER — OXYCODONE-ACETAMINOPHEN 5-325 MG PO TABS
1.0000 | ORAL_TABLET | ORAL | Status: DC | PRN
  Administered 2023-08-24 – 2023-08-25 (×2): 1 via ORAL
  Filled 2023-08-24 (×2): qty 1

## 2023-08-24 MED ORDER — ONDANSETRON HCL 4 MG/2ML IJ SOLN: 4.0000 mg | Freq: Four times a day (QID) | INTRAMUSCULAR | Status: DC | PRN

## 2023-08-24 MED ORDER — VANCOMYCIN HCL 1250 MG/250ML IV SOLN
1250.0000 mg | Freq: Two times a day (BID) | INTRAVENOUS | Status: DC
  Filled 2023-08-24: qty 250

## 2023-08-24 MED ORDER — MAGNESIUM SULFATE 2 GM/50ML IV SOLN
2.0000 g | Freq: Once | INTRAVENOUS | Status: AC
  Administered 2023-08-24: 2 g via INTRAVENOUS
  Filled 2023-08-24: qty 50

## 2023-08-24 MED ORDER — TIZANIDINE HCL 4 MG PO TABS
2.0000 mg | ORAL_TABLET | Freq: Three times a day (TID) | ORAL | Status: DC | PRN
  Administered 2023-08-24: 2 mg via ORAL
  Filled 2023-08-24: qty 1

## 2023-08-24 MED ORDER — HEPARIN (PORCINE) 25000 UT/250ML-% IV SOLN
1900.0000 [IU]/h | INTRAVENOUS | Status: DC
  Administered 2023-08-24 (×2): 1800 [IU]/h via INTRAVENOUS
  Administered 2023-08-25: 1900 [IU]/h via INTRAVENOUS
  Filled 2023-08-24 (×3): qty 250

## 2023-08-24 NOTE — Progress Notes (Signed)
   08/24/23 0938  TOC Brief Assessment  Insurance and Status Reviewed  Patient has primary care physician Yes  Home environment has been reviewed Resides in single family home with spouse  Prior level of function: Independent with ADLs at baseline  Prior/Current Home Services No current home services  Social Drivers of Health Review SDOH reviewed no interventions necessary  Readmission risk has been reviewed Yes  Transition of care needs no transition of care needs at this time

## 2023-08-24 NOTE — Consult Note (Signed)
 Reason for Consult: Bilateral femoral DVT postop Referring Physician: Dr. Aleck Hurdle Sinsel is an 48 y.o. male.  HPI: Patient underwent anterior right total hip arthroplasty 08/20/2023.  He was discharged home on the day of surgery and was doing well until yesterday when he noticed some blistering around his dressing on the anterior aspect of his right thigh.  He presented to the [facility] emergency room where he was evaluated and noted to have diminished pulses in the right lower extremity and a CT angiogram was accomplished showing no arterial compromise but he was diagnosed with bilateral femoral DVTs.  He has been admitted to the hospitalist service and is presently on IV anticoagulation.  I examined him this morning he was awake alert and reported that his lower extremities felt better.  The blistering to his lower extremity did not change overnight and has the appearance of a possible allergic rash although there is no actual blistering where the adhesive of the dressing had been applied.  Moving his hip in flexion extension did not cause him any discomfort.  He denied any shortness of breath this morning.  On further questioning this morning he does recall having a half brother who he thinks had a blood clot that may have gone into his lungs some years ago.  Past Medical History:  Diagnosis Date   Anxiety    Arthritis    Complication of anesthesia    Fingers numb after surgery   Depression    Diabetes mellitus type 2 in obese 01/15/2021   Elevated blood pressure reading without diagnosis of hypertension    Fatty liver    Gout    History of kidney stones    Hypertension    Impaired fasting blood sugar    Obesity    OSA (obstructive sleep apnea)     Past Surgical History:  Procedure Laterality Date   TOTAL HIP ARTHROPLASTY Left 05/21/2023   Procedure: LEFT TOTAL HIP ARTHROPLASTY ANTERIOR APPROACH;  Surgeon: Wendolyn Hamburger, MD;  Location: [facility];  Service: Orthopedics;  Laterality:  Left;   TOTAL HIP ARTHROPLASTY Right 08/20/2023   Procedure: ARTHROPLASTY, HIP, TOTAL, ANTERIOR APPROACH;  Surgeon: Wendolyn Hamburger, MD;  Location: [facility];  Service: Orthopedics;  Laterality: Right;  RIGHT TOTAL HIP ARTHROPLASTY ANTERIOR   UMBILICAL HERNIA REPAIR N/A 09/14/2017   Procedure: LAPAROSCOPIC UMBILICAL HERNIA;  Surgeon: Joyce Nixon, MD;  Location: [facility];  Service: General;  Laterality: N/A;   UMBILICAL HERNIA REPAIR N/A 02/11/2018   Procedure: LAPARASCOPIC REDO REPAIR OF RECURRENT UMBILICAL HERNIA WITH INSERTION OF MESH, LYSIS OF ADHESIONS;  Surgeon: Melvenia Stabs, MD;  Location: [facility];  Service: General;  Laterality: N/A;   XI ROBOTIC ASSISTED VENTRAL HERNIA N/A 03/01/2021   Procedure: ROBOTIC RECURRENT INCISIONAL HERNIA 9CMx9CM REPAIR WITH MESH AND REMOVAL OF INTRAPERITONEAL MESH;  Surgeon: Junie Olds, MD;  Location: [facility];  Service: General;  Laterality: N/A;    Family History  Problem Relation Age of Onset   Arthritis Mother    Hypertension Mother    Stroke Mother    Aneurysm Mother        died of brain aneurysm   Asthma Father    Cancer Father        ?   Arthritis Father    Heart disease Maternal Uncle    Diabetes Maternal Uncle    Alcohol abuse Neg Hx    Hyperlipidemia Neg Hx    Hearing loss Neg Hx    Kidney disease Neg  Hx    Early death Neg Hx     Social History:  reports that he quit smoking about 8 years ago. His smoking use included cigarettes. He started smoking about 12 years ago. He has a 1 pack-year smoking history. He has never been exposed to tobacco smoke. He has never used smokeless tobacco. He reports that he does not currently use alcohol. He reports that he does not use drugs.  Allergies: No Known Allergies  Medications: I have reviewed the patient's current medications.  Results for orders placed or performed during the hospital encounter of 08/23/23 (from the past 48 hours)  CBC with Differential     Status: Abnormal    Collection Time: 08/23/23  9:58 PM  Result Value Ref Range   WBC 13.8 (H) 4.0 - 10.5 K/uL   RBC 4.25 4.22 - 5.81 MIL/uL   Hemoglobin 11.1 (L) 13.0 - 17.0 g/dL   HCT 78.2 (L) 95.6 - 21.3 %   MCV 84.2 80.0 - 100.0 fL   MCH 26.1 26.0 - 34.0 pg   MCHC 31.0 30.0 - 36.0 g/dL   RDW 08.6 57.8 - 46.9 %   Platelets 265 150 - 400 K/uL   nRBC 0.0 0.0 - 0.2 %   Neutrophils Relative % 62 %   Neutro Abs 8.3 (H) 1.7 - 7.7 K/uL   Lymphocytes Relative 32 %   Lymphs Abs 4.5 (H) 0.7 - 4.0 K/uL   Monocytes Relative 5 %   Monocytes Absolute 0.7 0.1 - 1.0 K/uL   Eosinophils Relative 1 %   Eosinophils Absolute 0.2 0.0 - 0.5 K/uL   Basophils Relative 0 %   Basophils Absolute 0.0 0.0 - 0.1 K/uL   Immature Granulocytes 0 %   Abs Immature Granulocytes 0.05 0.00 - 0.07 K/uL    Comment: Performed at [facility], [address] [place], [place] [address]  Comprehensive metabolic panel     Status: Abnormal   Collection Time: 08/23/23  9:58 PM  Result Value Ref Range   Sodium 135 135 - 145 mmol/L   Potassium 3.2 (L) 3.5 - 5.1 mmol/L   Chloride 100 98 - 111 mmol/L   CO2 24 22 - 32 mmol/L   Glucose, Bld 157 (H) 70 - 99 mg/dL    Comment: Glucose reference range applies only to samples taken after fasting for at least 8 hours.   BUN 17 6 - 20 mg/dL   Creatinine, Ser 8.41 0.61 - 1.24 mg/dL   Calcium 8.3 (L) 8.9 - 10.3 mg/dL   Total Protein 6.9 6.5 - 8.1 g/dL   Albumin 3.1 (L) 3.5 - 5.0 g/dL   AST 23 15 - 41 U/L   ALT 25 0 - 44 U/L   Alkaline Phosphatase 75 38 - 126 U/L   Total Bilirubin 0.8 0.0 - 1.2 mg/dL   GFR, Estimated >32 >44 mL/min    Comment: (NOTE) Calculated using the CKD-EPI Creatinine Equation (2021)    Anion gap 11 5 - 15    Comment: Performed at [facility], [address] [place], [place] [address]  Lactic acid, plasma     Status: None   Collection Time: 08/23/23 11:08 PM  Result Value Ref Range   Lactic Acid, Venous 1.0 0.5 - 1.9 mmol/L     Comment: Performed at [facility], [address] [place], [place] [address]  CK     Status: Abnormal   Collection Time: 08/23/23 11:08 PM  Result Value  Ref Range   Total CK 499 (H) 49 - 397 U/L    Comment: Performed at [facility], [address] [place], [place] [address]  Blood culture (routine x 2)     Status: None (Preliminary result)   Collection Time: 08/24/23 12:15 AM   Specimen: BLOOD RIGHT ARM  Result Value Ref Range   Specimen Description      BLOOD RIGHT ARM Performed at [facility], [address], [place], [place] [address]    Special Requests      BOTTLES DRAWN AEROBIC AND ANAEROBIC Blood Culture results may not be optimal due to an inadequate volume of blood received in culture bottles Performed at [facility], [address] [place], [place] [address]    Culture      NO GROWTH < 12 HOURS Performed at [facility], [address], [place], [place] [address]    Report Status PENDING   Lactic acid, plasma     Status: None   Collection Time: 08/24/23  1:11 AM  Result Value Ref Range   Lactic Acid, Venous 1.0 0.5 - 1.9 mmol/L    Comment: Performed at [facility], [address], [place], [place] [address]  Blood culture (routine x 2)     Status: None (Preliminary result)   Collection Time: 08/24/23  1:11 AM   Specimen: BLOOD  Result Value Ref Range   Specimen Description      BLOOD BLOOD LEFT FOREARM Performed at [facility], [address], [place], [place] [address]    Special Requests      BOTTLES DRAWN AEROBIC AND ANAEROBIC Blood Culture results may not be optimal due to an inadequate volume of blood received in culture bottles Performed at [facility], [address] [place], [place] [address]    Culture      NO GROWTH < 12 HOURS Performed at [facility], [address], [place], [place] [address]     Report Status PENDING   Comprehensive metabolic panel with GFR     Status: Abnormal   Collection Time: 08/24/23  5:10 AM  Result Value Ref Range   Sodium 138 135 - 145 mmol/L   Potassium 3.5 3.5 - 5.1 mmol/L   Chloride 102 98 - 111 mmol/L   CO2 27 22 - 32 mmol/L   Glucose, Bld 105 (H) 70 - 99 mg/dL    Comment: Glucose reference range applies only to samples taken after fasting for at least 8 hours.   BUN 16 6 - 20 mg/dL   Creatinine, Ser 2.84 0.61 - 1.24 mg/dL   Calcium 8.3 (L) 8.9 - 10.3 mg/dL   Total Protein 6.4 (L) 6.5 - 8.1 g/dL   Albumin 2.9 (L) 3.5 - 5.0 g/dL   AST 20 15 - 41 U/L   ALT 22 0 - 44 U/L   Alkaline Phosphatase 73 38 - 126 U/L   Total Bilirubin 1.0 0.0 - 1.2 mg/dL   GFR, Estimated >13 >24 mL/min    Comment: (NOTE) Calculated using the CKD-EPI Creatinine Equation (2021)    Anion gap 9 5 - 15    Comment: Performed at [facility], [address], [place], [place] [address]  Magnesium      Status: None   Collection Time: 08/24/23  5:10 AM  Result Value Ref Range   Magnesium  1.7 1.7 - 2.4 mg/dL    Comment: Performed at [facility], [address] [place], [place] [address]    CT ANGIO AO+BIFEM W & OR WO CONTRAST Addendum Date: 08/24/2023 ADDENDUM REPORT: 08/24/2023 03:30 ADDENDUM: Thrombus is suspected in the superficial femoral veins, not in the arteries as incorrectly stated in the body of the report and in the 2nd impression. Apologies for the error. Electronically Signed   By: Denman Fischer M.D.   On: 08/24/2023 03:30   Result Date: 08/24/2023 CLINICAL DATA:  Right total hip arthroplasty done 08/20/2023, claudication or leg ischemia suspected with lower extremity pain and diminished pulses distally with blistering rash in the right lower extremity. EXAM: CT ANGIOGRAPHY OF ABDOMINAL AORTA WITH ILIOFEMORAL RUNOFF TECHNIQUE: Multidetector CT imaging of the abdomen, pelvis and lower extremities was performed using the standard  protocol during bolus administration of intravenous contrast. Multiplanar CT image reconstructions and MIPs were obtained to evaluate the vascular anatomy. RADIATION DOSE REDUCTION: This exam was performed according to the departmental dose-optimization program which includes automated exposure control, adjustment of the mA and/or kV according to patient size and/or use of iterative reconstruction technique. CONTRAST:  OMNIPAQUE  IOHEXOL  350 MG/ML SOLN COMPARISON:  No prior similar CTA study. Comparison is made with CT abdomen and pelvis with IV contrast 01/14/2021. FINDINGS: VASCULAR Aorta: Normal caliber aorta without aneurysm, dissection, vasculitis or significant stenosis. There is a mild amount of scattered nonstenosing calcific plaque. Celiac: Patent without evidence of aneurysm, dissection, vasculitis or significant stenosis. SMA: Patent without evidence of aneurysm, dissection, vasculitis or significant stenosis. Renals: Both are single. Both are normal. No hilar branch occlusion is seen. IMA: Patent without evidence of aneurysm, dissection, vasculitis or significant stenosis. RIGHT Lower Extremity Inflow: Common, internal and external iliac arteries are patent without evidence of aneurysm, dissection, vasculitis or significant stenosis. There are mild calcific plaques in the distal common iliac artery but not in the internal or external iliac arteries. Outflow: Common, superficial and profunda femoral arteries and the popliteal artery are patent without evidence of aneurysm, dissection, vasculitis or significant stenosis. Runoff: Patent three vessel runoff to the ankle. LEFT Lower Extremity Inflow: Common, internal and external iliac arteries are patent without evidence of aneurysm, dissection, vasculitis or significant stenosis. There are mild calcific plaques in the mid to distal common iliac artery but not in the internal or external iliac arteries. Outflow: Common, superficial and profunda femoral  arteries and the popliteal artery are patent without evidence of aneurysm, dissection, vasculitis or significant stenosis. There is trace calcific plaque in the distal superficial femoral artery no other visible plaque disease. Runoff: Patent three vessel runoff to the ankle. Veins: Suspected partially occlusive hypodense thrombus in both superficial femoral arteries, on the left ending in the popliteal artery, on the right extending into the posterior tibial veins. Bilateral lower extremity DVT ultrasound Doppler study is recommended. No other significant venous findings on this arterial phase exam. Normal hepatic portal vein and IVC. Pelvic veins are not opacified and are mostly obscured by bilateral hip replacements. Review of the MIP images confirms the above findings. NON-VASCULAR Lower chest: The heart is slightly enlarged.  Lung bases are clear. Hepatobiliary: Loss of fine detail due to respiratory motion. The liver is mildly steatotic measuring 21 cm length. No obvious mass. The gallbladder and bile ducts unremarkable, as visualized. Pancreas: No abnormality is seen through the breathing motion. Spleen: No abnormality is seen through the breathing motion. Adrenals/Urinary Tract: No abnormality is seen through breathing motion. Please note the bladder is obscured by bilateral hip arthroplasties. Stomach/Bowel:  No dilatation or wall thickening as far as seen. Normal appendix. Mild fecal stasis. Uncomplicated sigmoid diverticulosis. The rectum is obscured by the hip replacements. Lymphatic: No appreciable adenopathy. Reproductive: Prostate obscured by bilateral hip replacements. Both testicles are in the scrotal sac. Other: Small umbilical fat hernia. No incarcerated hernia. Small inguinal fat hernias. No free fluid or free air. Musculoskeletal: Degenerative change thoracic and lumbar spine. Mild to moderate arthrosis of the bilateral knees. In the lower extremities, there is normal muscle bulk bilaterally.  There is superficial edema in the right hip area and upper thigh, trace subcutaneous soft tissue air at the level of surgery. Findings could be normal postoperative edema or cellulitis. There is no localizing collection. No deep space edema or fluid collections and no deep space free air identified in the right lower extremity. There are mild bilateral features of midfoot arthrosis and bilateral calcaneal enthesopathy. Also noted is bilateral hallux valgus and first MTP DJD. IMPRESSION: 1. No arterial occlusion or significant stenosis is seen. Mild atherosclerosis as described above. 2. Suspected partially occlusive hypodense thrombus in both superficial femoral arteries, on the left ending in the popliteal artery, on the right extending into the posterior tibial veins. Bilateral lower extremity DVT ultrasound Doppler study is recommended. 3. Superficial edema in the right hip area and upper thigh, with trace subcutaneous soft tissue air at the level of surgery. Findings could be normal postoperative edema or cellulitis. No localizing collection. 4. No deep space edema or fluid collections and no deep space free air identified in the right lower extremity. 5. Mild cardiomegaly. 6. Mild hepatic steatosis. 7. Constipation and diverticulosis. 8. Small umbilical and inguinal fat hernias. 9. Degenerative changes of the spine, knees, midfoot, and feet. 10. 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 Electronically Signed: By: Denman Fischer M.D. On: 08/24/2023 01:29    Review of Systems: This morning the patient denies any chest pains or shortness of breath.  He reports no increased discomfort in his right total hip.  His left total hip it was placed last year is also doing well and he denies any discomfort in the left hip. Blood pressure (!) 148/84, pulse 82, temperature 98.4 F (36.9 C), temperature source Oral, resp. rate 20, weight 123.8 kg, SpO2 97%. Physical Exam: The patient's dressing has been removed there  is no drainage the wound is healing nicely there is no dehiscence.  He does have some blistering that starts at the edge of where the old dressing was appears to be superficial and again per the patient was stable overnight with no further progression.  He does not report any new pain in the leg.  On examination the thigh is relatively soft with no obvious swelling.  Axially loading the right lower extremity causes no discomfort logroll is negative.  Assessment/Plan: S/p technically successful right total hip arthroplasty 08/20/2023.  Bilateral deep femoral DVTs that presented yesterday despite being on aspirin , and TED hose.  Plan: From my point of view he is weightbearing as tolerated using a walker.  Regarding his anticoagulation that will be per the hospitalist service although I suspect it will be IV heparin  with a transition to full dose Eliquis.  We will follow him over the weekend.  Ilean Mall 08/24/2023, 6:35 AM

## 2023-08-24 NOTE — Progress Notes (Signed)
 Pharmacy Antibiotic Note  Cinch Ripple is a 48 y.o. male admitted on 08/23/2023 with recent hip arthroplasty presents with lower leg swelling.  Pharmacy has been consulted for vancomycin  dosing.  Today, 08/24/2023: D2 abx SCr increased slightly overnight Remains afebrile WBC slightly elevated on admit; not rechecked  Plan: Adjust vancomycin  to 1000 mg IV q12h (AUC 426 based on Scr 1.06; Vd 0.5) Cefepime  per MD; dosing appropriate  Weight: 123.8 kg (272 lb 14.9 oz)  Temp (24hrs), Avg:98.4 F (36.9 C), Min:98.2 F (36.8 C), Max:98.5 F (36.9 C)  Recent Labs  Lab 08/23/23 2158 08/23/23 2308 08/24/23 0111 08/24/23 0510  WBC 13.8*  --   --   --   CREATININE 0.97  --   --  1.06  LATICACIDVEN  --  1.0 1.0  --     Estimated Creatinine Clearance: 112 mL/min (by C-G formula based on SCr of 1.06 mg/dL).    No Known Allergies    Thank you for allowing pharmacy to be a part of this patient's care.   Tera Fellows, PharmD, BCPS [phone] 08/24/2023, 11:46 AM

## 2023-08-24 NOTE — Progress Notes (Signed)
 PHARMACY - ANTICOAGULATION CONSULT NOTE  Pharmacy Consult for heparin  Indication: VTE treatment  No Known Allergies  Patient Measurements: Weight: 123.8 kg (272 lb 14.9 oz)  Vital Signs: Temp: 98.3 F (36.8 C) (05/02 1742) Temp Source: Oral (05/02 1742) BP: 154/66 (05/02 1742) Pulse Rate: 75 (05/02 1742)  Labs: Recent Labs    08/23/23 2158 08/23/23 2308 08/24/23 0510 08/24/23 0928 08/24/23 1828  HGB 11.1*  --   --   --   --   HCT 35.8*  --   --   --   --   PLT 265  --   --   --   --   HEPARINUNFRC  --   --   --  0.36 0.29*  CREATININE 0.97  --  1.06  --   --   CKTOTAL  --  499* 410*  --   --     Estimated Creatinine Clearance: 112 mL/min (by C-G formula based on SCr of 1.06 mg/dL).  Assessment: 48 yo male with recent hip arthroplasty presents with lower leg swelling; concern for bilateral LE DVTs based on outpatient CTA. Pharmacy to dose heparin , no prior AC noted.    Today, 08/24/2023: CBC: hgb slightly low; Plt stable WNL 18:28 Heparin  level 0.29, slightly sub-therapeutic, with IV heparin  infusing at 1800 units/hr No bleeding or infusion issues per nursing  Goal of Therapy: Heparin  level 0.3-0.7 units/ml Monitor platelets by anticoagulation protocol: Yes  Plan: Increase heparin  IV infusion to 1900 units/hr Recheck heparin  level in 6 hrs after rate change Monitor daily heparin  level, CBC, signs/symptoms of bleeding   Thank you for allowing pharmacy to be a part of this patient's care.  Alfredo Inch, PharmD, BCPS Clinical Pharmacist [facility] 08/24/2023 7:56 PM

## 2023-08-24 NOTE — Telephone Encounter (Signed)
Patient Product/process development scientist completed.    The patient is insured through [facility].     Ran test claim for Eliquis 5 mg and the current 30 day co-pay is $4.00.  Ran test claim for Xarelto 20 mg and the current 30 day co-pay is $4.00.  This test claim was processed through [facility]- copay amounts may vary at other pharmacies due to pharmacy/plan contracts, or as the patient moves through the different stages of their insurance plan.     Roland Earl, CPHT Pharmacy Technician III Certified Patient Advocate [facility] Patient Advocate Team Direct Number: [phone]  Fax: [phone]

## 2023-08-24 NOTE — H&P (Signed)
 History and Physical    Patient: Frank Howe ZOX:[id number] DOB: 11-Feb-1976 DOA: 08/23/2023 DOS: the patient was seen and examined on 08/24/2023 PCP: Allene Ivan, MD  Patient coming from: Home  Chief Complaint:  Chief Complaint  Patient presents with   Leg Swelling    R   HPI: Frank Howe is a 48 y.o. male with medical history significant of anxiety, osteoarthritis, depression, type 2 diabetes, elevated BP, fatty liver, gout, nephrolithiasis, hypertension impaired fasting blood glucose, obesity, struct of sleep apnea who underwent an anterior right total hip arthroplasty on 08/20/2023 who has presented to the emergency department with right lower extremity edema, associated with tenderness and muscle cramping. He denied fever, chills, rhinorrhea, sore throat, wheezing or hemoptysis.  No chest pain, palpitations, diaphoresis, PND, orthopnea or pitting edema of the lower extremities.  No abdominal pain, nausea, emesis, diarrhea, constipation, melena or hematochezia.  No flank pain, dysuria, frequency or hematuria.  No polyuria, polydipsia, polyphagia or blurred vision.   Lab work: CBC showed white count 13.8, hemoglobin 11.1 g/dL and platelets 811.  Lactic acid x 2 normal.  Magnesium  is 1.7 mg/dL.  Total CK4 199 units/L.  CMP showed a potassium of 3.2 mmol/L, the rest of the electrolytes are normal after calcium correction.  Normal renal function.  Unremarkable LFTs with the a section of an albumin level 3.1 g/dL.  Glucose was 157 mg/dL.  Imaging: CTA of abdominal aorta with iliofemoral runoff done on 08/20/2023 showing no arterial occlusion or significant stenosis.  Mild atherosclerosis.  There was suspected partially occlusive hypodense thrombosis in both superficial femoral arteries, on the left ending in the popliteal artery, on the right sending into posterior tibial veins.  Bilateral lower extremity DVT ultrasound Doppler is recommended..  Mild cardiomegaly.  Mild hepatic steatosis.   Constipation and diverticulosis.  Small umbilical inguinal fat hernias.   ED course: Initial vital signs were temperature 98.3 F, pulse 92, respiration 19, BP 151/87 mmHg O2 sat 98% on room air.  The patient received clindamycin  900 mg IVPB, vancomycin  2000 mg IVPB, KCl 40 mEq p.o. x 2 and 1 tablet of oxycodone  5 mg.  He was started on heparin .  I ordered magnesium  sulfate 2 g IVPB.  Review of Systems: As mentioned in the history of present illness. All other systems reviewed and are negative.  Past Medical History:  Diagnosis Date   Anxiety    Arthritis    Complication of anesthesia    Fingers numb after surgery   Depression    Diabetes mellitus type 2 in obese 01/15/2021   Elevated blood pressure reading without diagnosis of hypertension    Fatty liver    Gout    History of kidney stones    Hypertension    Impaired fasting blood sugar    Obesity    OSA (obstructive sleep apnea)    Past Surgical History:  Procedure Laterality Date   TOTAL HIP ARTHROPLASTY Left 05/21/2023   Procedure: LEFT TOTAL HIP ARTHROPLASTY ANTERIOR APPROACH;  Surgeon: Wendolyn Hamburger, MD;  Location: [facility];  Service: Orthopedics;  Laterality: Left;   TOTAL HIP ARTHROPLASTY Right 08/20/2023   Procedure: ARTHROPLASTY, HIP, TOTAL, ANTERIOR APPROACH;  Surgeon: Wendolyn Hamburger, MD;  Location: [facility];  Service: Orthopedics;  Laterality: Right;  RIGHT TOTAL HIP ARTHROPLASTY ANTERIOR   UMBILICAL HERNIA REPAIR N/A 09/14/2017   Procedure: LAPAROSCOPIC UMBILICAL HERNIA;  Surgeon: Joyce Nixon, MD;  Location: [facility];  Service: General;  Laterality: N/A;   UMBILICAL HERNIA REPAIR N/A 02/11/2018  Procedure: LAPARASCOPIC REDO REPAIR OF RECURRENT UMBILICAL HERNIA WITH INSERTION OF MESH, LYSIS OF ADHESIONS;  Surgeon: Melvenia Stabs, MD;  Location: [facility];  Service: General;  Laterality: N/A;   XI ROBOTIC ASSISTED VENTRAL HERNIA N/A 03/01/2021   Procedure: ROBOTIC RECURRENT INCISIONAL HERNIA 9CMx9CM REPAIR WITH MESH AND REMOVAL  OF INTRAPERITONEAL MESH;  Surgeon: Junie Olds, MD;  Location: [facility];  Service: General;  Laterality: N/A;   Social History:  reports that he quit smoking about 8 years ago. His smoking use included cigarettes. He started smoking about 12 years ago. He has a 1 pack-year smoking history. He has never been exposed to tobacco smoke. He has never used smokeless tobacco. He reports that he does not currently use alcohol. He reports that he does not use drugs.  No Known Allergies  Family History  Problem Relation Age of Onset   Arthritis Mother    Hypertension Mother    Stroke Mother    Aneurysm Mother        died of brain aneurysm   Asthma Father    Cancer Father        ?   Arthritis Father    Heart disease Maternal Uncle    Diabetes Maternal Uncle    Alcohol abuse Neg Hx    Hyperlipidemia Neg Hx    Hearing loss Neg Hx    Kidney disease Neg Hx    Early death Neg Hx     Prior to Admission medications   Medication Sig Start Date End Date Taking? Authorizing Provider  meloxicam  (MOBIC ) 7.5 MG tablet Take 7.5 mg by mouth 2 (two) times daily. 08/16/23  Yes [provider]  amLODipine  (NORVASC ) 10 MG tablet Take 10 mg by mouth daily. 02/09/21   [provider]  aspirin  EC 81 MG tablet Take 1 tablet (81 mg total) by mouth 2 (two) times daily. 08/20/23   Sheron Dixons, PA-C  carvedilol  (COREG ) 3.125 MG tablet Take 3.125 mg by mouth 2 (two) times daily. 03/02/23   [provider]  colchicine  0.6 MG tablet Take 1 tablet (0.6 mg total) by mouth daily as needed (gout flare). Patient not taking: Reported on 08/03/2023 01/17/21 03/01/21  Audria Leather, MD  loperamide  (IMODIUM  A-D) 2 MG tablet Take 2 tablets (4 mg total) by mouth 4 (four) times daily as needed for diarrhea or loose stools. Patient not taking: Reported on 05/04/2023 11/14/21   Eloise Hake Scales, PA-C  losartan -hydrochlorothiazide  (HYZAAR) 100-12.5 MG tablet Take 1 tablet by mouth daily. 10/01/21    [provider]  Menthol , Topical Analgesic, (BENGAY EX) Apply 1 Application topically daily as needed (pain).    [provider]  metFORMIN (GLUCOPHAGE-XR) 500 MG 24 hr tablet Take 500 mg by mouth daily. 07/18/21   [provider]  NON FORMULARY Pt uses a c-pap nightly    [provider]  ondansetron  (ZOFRAN -ODT) 8 MG disintegrating tablet Take 1 tablet (8 mg total) by mouth every 8 (eight) hours as needed for nausea or vomiting. Patient not taking: Reported on 05/04/2023 11/14/21   Eloise Hake Scales, PA-C  oxyCODONE -acetaminophen  (PERCOCET/ROXICET) 5-325 MG tablet Take 1 tablet by mouth every 4 (four) hours as needed for severe pain (pain score 7-10). 08/20/23   Sheron Dixons, PA-C  OZEMPIC, 2 MG/DOSE, 8 MG/3ML SOPN Inject 2 mg into the skin every Thursday. 04/09/23   [provider]  rosuvastatin (CRESTOR) 10 MG tablet Take 10 mg by mouth at bedtime. 04/20/23   [provider]  tiZANidine  (ZANAFLEX ) 2 MG tablet Take 1 tablet (2 mg total) by mouth every 6 (six) hours as needed for muscle spasms. 08/20/23   Sheron Dixons, PA-C  Vitamin D, Ergocalciferol, (DRISDOL) 1.25 MG (50000 UNIT) CAPS capsule Take 50,000 Units by mouth 2 (two) times a week. 03/02/23   [provider]    Physical Exam: Vitals:   08/24/23 0315 08/24/23 0330 08/24/23 0419 08/24/23 0450  BP: 119/64 124/79 (!) 148/84   Pulse: 79 76 82   Resp: (!) 23 (!) 24 20   Temp:   98.4 F (36.9 C)   TempSrc:   Oral   SpO2: 93% 91% 97%   Weight:    123.8 kg   Physical Exam Vitals reviewed.  Constitutional:      General: He is awake. He is not in acute distress.    Appearance: He is ill-appearing.  HENT:     Head: Normocephalic.     Nose: No rhinorrhea.     Mouth/Throat:     Mouth: Mucous membranes are moist.  Eyes:     General: No scleral icterus.    Pupils: Pupils are equal, round, and reactive to light.  Neck:     Vascular: No JVD.  Cardiovascular:      Rate and Rhythm: Normal rate and regular rhythm.     Heart sounds: S1 normal and S2 normal.  Pulmonary:     Effort: Pulmonary effort is normal.     Breath sounds: Normal breath sounds. No wheezing, rhonchi or rales.  Abdominal:     General: Bowel sounds are normal. There is no distension.     Palpations: Abdomen is soft.     Tenderness: There is no abdominal tenderness.  Musculoskeletal:     Cervical back: Neck supple.     Right upper leg: Swelling, edema and tenderness present.     Right lower leg: No edema.     Left lower leg: No edema.  Skin:    General: Skin is warm and dry.  Neurological:     General: No focal deficit present.     Mental Status: He is alert and oriented to person, place, and time.  Psychiatric:        Mood and Affect: Mood normal.        Behavior: Behavior normal. Behavior is cooperative.    Data Reviewed:  Results are pending, will review when available.  Assessment and Plan: Principal Problem:   Acute bilateral deep vein thrombosis (DVT) of femoral veins (HCC) With associated:   Cellulitis of right lower extremity Admit to telemetry/inpatient. Continue IV heparin . Will switch to DOAC in 24 to 48 hours. Analgesics as needed. Discontinue clindamycin . Continue vancomycin  per pharmacy. Follow-up blood culture and sensitivity Follow CBC and CMP in a.m. Orthopedic surgery consult appreciated.  Active Problems:   Hypokalemia Replacing. Follow-up potassium level.    Elevated CK Likely due to recent procedure. Will hold statin for now.    Gout, chronic Colchicine  as needed.    OSA (obstructive sleep apnea) CPAP at bedtime.    Essential hypertension Continue amlodipine  10 mg p.o. daily. Continue carvedilol  3.125 mg p.o. twice daily. Continue losartan  100 mg p.o. daily. Continue hydrochlorothiazide  12.5 mg p.o. daily.    Type 2 diabetes mellitus with obesity (HCC) Carbohydrate modified diet. CBG monitoring before meals and bedtime.   If  needed, will add RI SS. Check hemoglobin A1c.    Obesity, Class III, BMI 40-49.9 (morbid obesity) Current BMI 40.30 kg/m.  Would benefit from lifestyle modifications. Follow-up closely with PCP and/or bariatric clinic.    Moderate protein malnutrition (HCC) In the setting of anemia and recent procedure. May benefit from protein supplementation. Consider nutritional services evaluation. Follow-up albumin level.     Advance Care Planning:   Code Status: Full Code   Consults: Orthopedic surgery Wendolyn Hamburger, MD)  Family Communication: His wife was at bedside.  Severity of Illness: The appropriate patient status for this patient is INPATIENT. Inpatient status is judged to be reasonable and necessary in order to provide the required intensity of service to ensure the patient's safety. The patient's presenting symptoms, physical exam findings, and initial radiographic and laboratory data in the context of their chronic comorbidities is felt to place them at high risk for further clinical deterioration. Furthermore, it is not anticipated that the patient will be medically stable for discharge from the hospital within 2 midnights of admission.   * I certify that at the point of admission it is my clinical judgment that the patient will require inpatient hospital care spanning beyond 2 midnights from the point of admission due to high intensity of service, high risk for further deterioration and high frequency of surveillance required.*  Author: Danice Dural, MD 08/24/2023 8:01 AM  For on call review [email or web address].   Aaron Aasdfo

## 2023-08-24 NOTE — Progress Notes (Signed)
 Lower extremity venous duplex completed. Please see CV Procedures for preliminary results.  Estanislao Heimlich, RVT 08/24/23 9:51 AM

## 2023-08-24 NOTE — ED Notes (Signed)
 Ok to give Vanc first per pharmacy since Zosyn  runs over 4hrs

## 2023-08-24 NOTE — Progress Notes (Signed)
 PHARMACY - ANTICOAGULATION CONSULT NOTE  Pharmacy Consult for heparin  Indication: VTE treatment  No Known Allergies  Patient Measurements: Weight: 123.8 kg (272 lb 14.9 oz)  Vital Signs: Temp: 98.5 F (36.9 C) (05/02 0812) Temp Source: Oral (05/02 0812) BP: 137/72 (05/02 0812) Pulse Rate: 74 (05/02 0812)  Labs: Recent Labs    08/23/23 2158 08/23/23 2308 08/24/23 0510 08/24/23 0928  HGB 11.1*  --   --   --   HCT 35.8*  --   --   --   PLT 265  --   --   --   HEPARINUNFRC  --   --   --  0.36  CREATININE 0.97  --  1.06  --   CKTOTAL  --  499*  --   --     Estimated Creatinine Clearance: 112 mL/min (by C-G formula based on SCr of 1.06 mg/dL).  Assessment: 48 yo male with recent hip arthroplasty presents with lower leg swelling; concern for bilateral LE DVTs based on outpatient CTA. Pharmacy to dose heparin , no prior [facility] noted   Plan:  Heparin  bolus 4000 units x 1 Then start heparin  drip at 1800 units/hr Heparin  level in 8 hours Daily CBC  Significant events:  Today, 08/24/2023: CBC: hgb slightly low; Plt stable WNL Most recent heparin  level therapeutic on 1800 units/hr No bleeding or infusion issues per nursing  Goal of Therapy: Heparin  level 0.3-0.7 units/ml Monitor platelets by anticoagulation protocol: Yes  Plan: Continue heparin  IV infusion at 1800 units/hr Recheck confirmatory heparin  level in 8 hrs Daily CBC, daily heparin  level once stable Monitor for signs of bleeding or thrombosis  Tera Fellows, PharmD, BCPS [phone] 08/24/2023, 11:23 AM

## 2023-08-24 NOTE — Progress Notes (Signed)
  Carryover admission to the Day Admitter.  I discussed this case with the EDP, Riley Ranson, PA.  Per these discussions:   This is a 48 year old male who recently underwent right total hip arthroplasty on Monday, 08/20/2023, with Dr. Rollin of [facility] orthopedics, who is being admitted with acute DVTs of the bilateral lower extremities after presenting for evaluation of blistering erythema involving the right lower extremity.  There was initially concern regarding the presence of palpable pulses in the bilateral lower extremities, prompting CT angio with bifemoral runoff which showed no evidence of arterial clot or arterial occlusion, but was suggestive of acute bilateral DVTs.  EDP discussed patient's case with on-call vascular surgery, Dr. Zoila Hines, who reviewed the CT angio with bifemoral runoff and confirmed no evidence of acute arterial pathology, but rather the presence of acute bilateral DVTs, recommending anticoagulation for such, and giving no indication for vascular surgical intervention at this time.  The patient was subsequently started on heparin  drip.   CT angio with bifemoral runoff was also suggestive of subcutaneous edema in the area of the right hip potentially representing cellulitis versus postoperative edema.  There was also the presence of trace subcutaneous air at this level.  In this context, the patient was started on IV vancomycin , Zosyn  as well as clindamycin  in the ED.  EDP d/w on-call orthopedic surgeon from [facility], Dr. Jackee Marus, who conveyed the [facility] will consult and see pt in the AM.   Presenting labs also notable for potassium level of 3.2.  I have placed an order for inpatient admission to med/tele for further evaluation management of the above.  I have placed some additional preliminary admit orders via the adult multi-morbid admission order set. I have also ordered continuation of heparin  drip, and ordered IV vancomycin , cefepime , and IV  clindamycin .  Prn IV Dilaudid .  I also ordered potassium chloride  40 mill equivalents p.o. x 1 dose now, as well as morning labs that include CMP, CBC, magnesium  level.    Camelia Cavalier, DO Hospitalist

## 2023-08-24 NOTE — Progress Notes (Signed)
 Pharmacy Antibiotic Note  Cassey Bateson is a 48 y.o. male admitted on 08/23/2023 with recent hip arthroplasty presents with lower leg swelling.  Pharmacy has been consulted for vancomycin  dosing.  Plan: Vancomycin  2gm IV x 1 then 1250mg  q12h (AUC 501.5, Scr 0.97) Follow renal function, cultures and clinical course     Temp (24hrs), Avg:98.3 F (36.8 C), Min:98.2 F (36.8 C), Max:98.3 F (36.8 C)  Recent Labs  Lab 08/23/23 2158 08/23/23 2308  WBC 13.8*  --   CREATININE 0.97  --   LATICACIDVEN  --  1.0    Estimated Creatinine Clearance: 120.8 mL/min (by C-G formula based on SCr of 0.97 mg/dL).    No Known Allergies  Antimicrobials this admission: 5/2 vanc >> 5/2 zosyn  >>  Dose adjustments this admission:   Microbiology results: 5/2 BCx:    Thank you for allowing pharmacy to be a part of this patient's care.  Beau Bound RPh 08/24/2023, 1:31 AM

## 2023-08-24 NOTE — ED Provider Notes (Signed)
 [place] EMERGENCY DEPARTMENT AT [facility] Provider Note   CSN: [id number] Arrival date & time: 08/23/23  2110     History Chief Complaint  Patient presents with   Leg Swelling    R    Frank Howe is a 48 y.o. male with history of hypertension and diabetes presents the emerged from today for evaluation of blistering rash.  Patient recently had total hip replacement on the right hip on Monday by Dr. Carry Clapper.  The patient reports he has not had any worsening of pain but was concerned because of the rash.  Denies any numbness or tingling.  He reports that he was told there was going to be some bruising.  Patient and wife think that the bruising in the posterior leg started maybe Tuesday/Wednesday but the new discoloration on the anterior thigh was from today.  The blisters started to form today. He reports that he has been using african soap to clean the surrounding area, but no additional products. He reports that the blisters are "uncomfortable", but denies pain or itching to the areas. He denies any increase in swelling to the leg.  Denies any fever, chest pain, or shortness of breath.  The patient says he is not on any blood thinners however was told to take a daily baby aspirin  which has been doing.  Has been wearing his TED hose however he reports they have been rolling down. He denies any other blistering. Denies any numbness or tingling in the leg.   HPI     Home Medications Prior to Admission medications   Medication Sig Start Date End Date Taking? Authorizing Provider  amLODipine  (NORVASC ) 10 MG tablet Take 10 mg by mouth daily. 02/09/21   [provider]  aspirin  EC 81 MG tablet Take 1 tablet (81 mg total) by mouth 2 (two) times daily. 08/20/23   Sheron Dixons, PA-C  carvedilol  (COREG ) 3.125 MG tablet Take 3.125 mg by mouth 2 (two) times daily. 03/02/23   [provider]  colchicine  0.6 MG tablet Take 1 tablet (0.6 mg total) by mouth daily as needed  (gout flare). Patient not taking: Reported on 08/03/2023 01/17/21 03/01/21  Audria Leather, MD  loperamide  (IMODIUM  A-D) 2 MG tablet Take 2 tablets (4 mg total) by mouth 4 (four) times daily as needed for diarrhea or loose stools. Patient not taking: Reported on 05/04/2023 11/14/21   Eloise Hake Scales, PA-C  losartan -hydrochlorothiazide  (HYZAAR) 100-12.5 MG tablet Take 1 tablet by mouth daily. 10/01/21   [provider]  Menthol , Topical Analgesic, (BENGAY EX) Apply 1 Application topically daily as needed (pain).    [provider]  metFORMIN (GLUCOPHAGE-XR) 500 MG 24 hr tablet Take 500 mg by mouth daily. 07/18/21   [provider]  NON FORMULARY Pt uses a c-pap nightly    [provider]  ondansetron  (ZOFRAN -ODT) 8 MG disintegrating tablet Take 1 tablet (8 mg total) by mouth every 8 (eight) hours as needed for nausea or vomiting. Patient not taking: Reported on 05/04/2023 11/14/21   Eloise Hake Scales, PA-C  oxyCODONE -acetaminophen  (PERCOCET/ROXICET) 5-325 MG tablet Take 1 tablet by mouth every 4 (four) hours as needed for severe pain (pain score 7-10). 08/20/23   Sheron Dixons, PA-C  OZEMPIC, 2 MG/DOSE, 8 MG/3ML SOPN Inject 2 mg into the skin every Thursday. 04/09/23   [provider]  rosuvastatin (CRESTOR) 10 MG tablet Take 10 mg by mouth at bedtime. 04/20/23   [provider]  tiZANidine  (ZANAFLEX ) 2  MG tablet Take 1 tablet (2 mg total) by mouth every 6 (six) hours as needed for muscle spasms. 08/20/23   Sheron Dixons, PA-C  Vitamin D, Ergocalciferol, (DRISDOL) 1.25 MG (50000 UNIT) CAPS capsule Take 50,000 Units by mouth 2 (two) times a week. 03/02/23   [provider]      Allergies    Patient has no known allergies.    Review of Systems   Review of Systems  Constitutional:  Negative for chills and fever.  Respiratory:  Negative for shortness of breath.   Cardiovascular:  Negative for chest pain.  Skin:  Positive for  rash.    Physical Exam Updated Vital Signs BP (!) 152/82   Pulse 73   Temp 98.3 F (36.8 C) (Oral)   Resp 18   SpO2 96%  Physical Exam Vitals and nursing note reviewed.  Constitutional:      General: He is not in acute distress.    Appearance: He is not ill-appearing or toxic-appearing.  HENT:     Mouth/Throat:     Mouth: Mucous membranes are moist.  Eyes:     General: No scleral icterus. Cardiovascular:     Rate and Rhythm: Normal rate.  Pulmonary:     Effort: Pulmonary effort is normal. No respiratory distress.  Abdominal:     Palpations: Abdomen is soft.     Tenderness: There is no abdominal tenderness. There is no guarding or rebound.  Musculoskeletal:     Comments: Please see skin and photos.  Legs distally appear symmetric in size.  There is some swelling to the more anterior and lateral aspect of the right thigh.  Compartments are soft.  Patient has a 2+ DP PT pulse on the left however right pulse was not palpable.  Was able to get this with Doppler.  There is no increase in swelling to the distal lower extremities.  There is no discoloration or temperature change.  Legs are both warm.  Patient reportedly sensations intact distally and is symmetric.  Skin:    General: Skin is warm.     Comments: Mepiplex bandage in place securely. Was removed. Sutured incision appears intact with no drainage. No surrounding erythema or deheisence.  There is also no blistering or skin changes noted under the bandage.  Surrounding the bandage is darkened skin, bruised?  With large overlying fluid-filled blisters.  Do not appreciate any purulence or blood-tinged fluid.  The skin changes are noted to the anterior portion of the thigh to the inferior more medial aspect from the bandage.  There is also some posterior skin darkening again question bruising, with developing blisters noted to the posterior lateral aspect. No sking sloughing. There is some increase in warmth and redness to the area.  There appears to be swelling present more to the anterior and lateral aspect of the thigh.  Cap refill 2 to 3 seconds.  Neurological:     Mental Status: He is alert.            ED Results / Procedures / Treatments   Labs (all labs ordered are listed, but only abnormal results are displayed) Labs Reviewed  CBC WITH DIFFERENTIAL/PLATELET - Abnormal; Notable for the following components:      Result Value   WBC 13.8 (*)    Hemoglobin 11.1 (*)    HCT 35.8 (*)    Neutro Abs 8.3 (*)    Lymphs Abs 4.5 (*)    All other components within normal limits  COMPREHENSIVE METABOLIC  PANEL WITH GFR - Abnormal; Notable for the following components:   Potassium 3.2 (*)    Glucose, Bld 157 (*)    Calcium 8.3 (*)    Albumin 3.1 (*)    All other components within normal limits  CK - Abnormal; Notable for the following components:   Total CK 499 (*)    All other components within normal limits  CULTURE, BLOOD (ROUTINE X 2)  CULTURE, BLOOD (ROUTINE X 2)  LACTIC ACID, PLASMA  LACTIC ACID, PLASMA    EKG None  Radiology CT ANGIO AO+BIFEM W & OR WO CONTRAST Addendum Date: 08/24/2023 ADDENDUM REPORT: 08/24/2023 03:30 ADDENDUM: Thrombus is suspected in the superficial femoral veins, not in the arteries as incorrectly stated in the body of the report and in the 2nd impression. Apologies for the error. Electronically Signed   By: Denman Fischer M.D.   On: 08/24/2023 03:30   Result Date: 08/24/2023 CLINICAL DATA:  Right total hip arthroplasty done 08/20/2023, claudication or leg ischemia suspected with lower extremity pain and diminished pulses distally with blistering rash in the right lower extremity. EXAM: CT ANGIOGRAPHY OF ABDOMINAL AORTA WITH ILIOFEMORAL RUNOFF TECHNIQUE: Multidetector CT imaging of the abdomen, pelvis and lower extremities was performed using the standard protocol during bolus administration of intravenous contrast. Multiplanar CT image reconstructions and MIPs were obtained to  evaluate the vascular anatomy. RADIATION DOSE REDUCTION: This exam was performed according to the departmental dose-optimization program which includes automated exposure control, adjustment of the mA and/or kV according to patient size and/or use of iterative reconstruction technique. CONTRAST:  OMNIPAQUE  IOHEXOL  350 MG/ML SOLN COMPARISON:  No prior similar CTA study. Comparison is made with CT abdomen and pelvis with IV contrast 01/14/2021. FINDINGS: VASCULAR Aorta: Normal caliber aorta without aneurysm, dissection, vasculitis or significant stenosis. There is a mild amount of scattered nonstenosing calcific plaque. Celiac: Patent without evidence of aneurysm, dissection, vasculitis or significant stenosis. SMA: Patent without evidence of aneurysm, dissection, vasculitis or significant stenosis. Renals: Both are single. Both are normal. No hilar branch occlusion is seen. IMA: Patent without evidence of aneurysm, dissection, vasculitis or significant stenosis. RIGHT Lower Extremity Inflow: Common, internal and external iliac arteries are patent without evidence of aneurysm, dissection, vasculitis or significant stenosis. There are mild calcific plaques in the distal common iliac artery but not in the internal or external iliac arteries. Outflow: Common, superficial and profunda femoral arteries and the popliteal artery are patent without evidence of aneurysm, dissection, vasculitis or significant stenosis. Runoff: Patent three vessel runoff to the ankle. LEFT Lower Extremity Inflow: Common, internal and external iliac arteries are patent without evidence of aneurysm, dissection, vasculitis or significant stenosis. There are mild calcific plaques in the mid to distal common iliac artery but not in the internal or external iliac arteries. Outflow: Common, superficial and profunda femoral arteries and the popliteal artery are patent without evidence of aneurysm, dissection, vasculitis or significant stenosis.  There is trace calcific plaque in the distal superficial femoral artery no other visible plaque disease. Runoff: Patent three vessel runoff to the ankle. Veins: Suspected partially occlusive hypodense thrombus in both superficial femoral arteries, on the left ending in the popliteal artery, on the right extending into the posterior tibial veins. Bilateral lower extremity DVT ultrasound Doppler study is recommended. No other significant venous findings on this arterial phase exam. Normal hepatic portal vein and IVC. Pelvic veins are not opacified and are mostly obscured by bilateral hip replacements. Review of the MIP images confirms the above  findings. NON-VASCULAR Lower chest: The heart is slightly enlarged.  Lung bases are clear. Hepatobiliary: Loss of fine detail due to respiratory motion. The liver is mildly steatotic measuring 21 cm length. No obvious mass. The gallbladder and bile ducts unremarkable, as visualized. Pancreas: No abnormality is seen through the breathing motion. Spleen: No abnormality is seen through the breathing motion. Adrenals/Urinary Tract: No abnormality is seen through breathing motion. Please note the bladder is obscured by bilateral hip arthroplasties. Stomach/Bowel: No dilatation or wall thickening as far as seen. Normal appendix. Mild fecal stasis. Uncomplicated sigmoid diverticulosis. The rectum is obscured by the hip replacements. Lymphatic: No appreciable adenopathy. Reproductive: Prostate obscured by bilateral hip replacements. Both testicles are in the scrotal sac. Other: Small umbilical fat hernia. No incarcerated hernia. Small inguinal fat hernias. No free fluid or free air. Musculoskeletal: Degenerative change thoracic and lumbar spine. Mild to moderate arthrosis of the bilateral knees. In the lower extremities, there is normal muscle bulk bilaterally. There is superficial edema in the right hip area and upper thigh, trace subcutaneous soft tissue air at the level of surgery.  Findings could be normal postoperative edema or cellulitis. There is no localizing collection. No deep space edema or fluid collections and no deep space free air identified in the right lower extremity. There are mild bilateral features of midfoot arthrosis and bilateral calcaneal enthesopathy. Also noted is bilateral hallux valgus and first MTP DJD. IMPRESSION: 1. No arterial occlusion or significant stenosis is seen. Mild atherosclerosis as described above. 2. Suspected partially occlusive hypodense thrombus in both superficial femoral arteries, on the left ending in the popliteal artery, on the right extending into the posterior tibial veins. Bilateral lower extremity DVT ultrasound Doppler study is recommended. 3. Superficial edema in the right hip area and upper thigh, with trace subcutaneous soft tissue air at the level of surgery. Findings could be normal postoperative edema or cellulitis. No localizing collection. 4. No deep space edema or fluid collections and no deep space free air identified in the right lower extremity. 5. Mild cardiomegaly. 6. Mild hepatic steatosis. 7. Constipation and diverticulosis. 8. Small umbilical and inguinal fat hernias. 9. Degenerative changes of the spine, knees, midfoot, and feet. 10. 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 Electronically Signed: By: Denman Fischer M.D. On: 08/24/2023 01:29    Procedures Procedures   Medications Ordered in ED Medications  vancomycin  (VANCOREADY) IVPB 2000 mg/400 mL (has no administration in time range)  piperacillin -tazobactam (ZOSYN ) IVPB 3.375 g (has no administration in time range)  oxyCODONE -acetaminophen  (PERCOCET/ROXICET) 5-325 MG per tablet 1 tablet (1 tablet Oral Given 08/23/23 2305)  iohexol  (OMNIPAQUE ) 350 MG/ML injection 100 mL (100 mLs Intravenous Contrast Given 08/24/23 0023)    ED Course/ Medical Decision Making/ A&P Clinical Course as of 08/24/23 0809  Thu Aug 23, 2023  2249 Awaiting consult call back from  orthopedics [RR]  Fri Aug 24, 2023  0021 Dr. Jackee Marus called back. He will review case and call back with recommendations.  [RR]  [phone] Dr. Jackee Marus has personally reviewed the image and thinks this is expected inflammation. He does not see abnormality with the vessels. Reports unless read shows change, his surgeon will call him later today and see him later today in office.  [RR]  0202 Consulted vascular surgery. He is currently in surgery, but will review the images momentarily.  [RR]    Clinical Course User Index [RR] Spence Dux, PA-C   Medical Decision Making Amount and/or Complexity of Data Reviewed Labs: ordered.  Radiology: ordered.  Risk Prescription drug management. Decision regarding hospitalization.   48 y.o. male presents to the ER for evaluation of blistering rash. Differential diagnosis includes but is not limited to cellulitis, allergic reaction, bullous impetigo, arterial clot, postsurgical complication. Vital signs mildly elevated blood pressure otherwise unremarkable. Physical exam as noted above.   Patient has a dopplerable pulse on the right but is easily palpable on the left both DP and PT.  With Doppler and able to get a DP and PT pulse on the right.  Does have some swelling to the upper right thigh but compartments are soft.  Patient not appearing in any acute distress or pain.  He is not having any increase in pain. Please see images of the blistering and discoloration.  The lower legs appear and feel symmetric in coloration and temperature and size.  Given the diminished pulse, this is concerning.  Will order CT angio studies.  I have also ordered lab work as well.  Will page out to orthopedics.  My attending assessed the patient at bedside recommending vancomycin  and Zosyn .  Antibiotics ordered. She also is unable to feel a pulse on the RLE, however it was found with Doppler. Patient was given Percocet for post-op pain. Again, the patient denies any increase in pain  to the area. Denies numbness or tingling.   I independently reviewed and interpreted the patient's labs.  CMP shows mildly decreased potassium at 3.2.  Glucose at 157, calcium 8.3, albumin 3.1, otherwise no other electrolyte or LFT abnormality.  CBC shows slight increase in white blood cell count at 13.8 with a left shift and also increase in lymphocytes.  Hemoglobin 11.1.  Previous 1 postop was 12.6.  Platelets within normal limits.  Lactic acid within normal limits.  CK is mildly elevated at 499.  Blood cultures in process.  I consulted orthopedics and spoke with Dr. Jackee Marus on call for [facility].  He reviewed the patient's images and the photos uploaded to the chart.  He thinks this is likely a fracture blister and thinks the patient can be discharged with outpatient follow-up if the CT scan does not show any acute finding.  He is unsure why the patient has a diminished pulse.  On reevaluation, patient still in no acute distress.  Not requesting any pain medicine at this time.  Pulse is still not palpable but palpable at this time.  No color, temperature, or size changes noted.  CT shows 1. No arterial occlusion or significant stenosis is seen. Mild atherosclerosis as described above. 2. Suspected partially occlusive hypodense thrombus in both superficial femoral arteries, on the left ending in the popliteal artery, on the right extending into the posterior tibial veins. Bilateral lower extremity DVT ultrasound Doppler study is recommended. 3. Superficial edema in the right hip area and upper thigh, with trace subcutaneous soft tissue air at the level of surgery. Findings could be normal postoperative edema or cellulitis. No localizing collection. 4. No deep space edema or fluid collections and no deep space free air identified in the right lower extremity. 5. Mild cardiomegaly. 6. Mild hepatic steatosis. 7. Constipation and diverticulosis. 8. Small umbilical and inguinal fat hernias. 9.  Degenerative changes of the spine, knees, midfoot, and feet. 10. 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 ADDENDUM PER RAD : Thrombus is suspected in the superficial femoral veins, not in the arteries as incorrectly stated in the body of the report and in the 2nd impression. Apologies for the error.Per radiologist's interpretation.  CT showing no arterial occlusion or significant stenosis.  There is suspected partially occlusive thrombus in both superficial femoral veins. Extending into the popliteal vein on the left and extending into the posterior tibial veins on the right.  They are unable to tell if this is more edema versus cellulitis but there is no localizing collection.  I have consulted vascular surgery to review the images as well because the patient's pulses still diminished on the right.  I evaluated the patient again and he now has 2+ pulses bilaterally.  Unsure as the patient is only received antibiotics.  Discussed with my attending.  The thought is possible vasospasm from the DVT.  Patient's pain at stable level.  No significant improvement or worsening.  He is not having any chest pain or shortness of breath.  I discussed this with Dr. Edgardo Goodwill. He reviewed the images and does not see any acute arterial findings. DVTs will be treated with heparin .   My attending and I do not feel that this patient should be discharged home and should at least be seen in observation with inpatient orthopedic evaluation given the blistering at the surgical site as well as the diminished pulse at one point. The patient and wife are in agreement to the plan. Orthopedics is aware they will need to evaluate the patient in patient tomorrow.  I discussed this case with my attending physician who cosigned this note including patient's presenting symptoms, physical exam, and planned diagnostics and interventions. Attending physician stated agreement with plan or made changes to plan which were implemented.    Attending physician assessed patient at bedside.  Portions of this report may have been transcribed using voice recognition software. Every effort was made to ensure accuracy; however, inadvertent computerized transcription errors may be present.   Final Clinical Impression(s) / ED Diagnoses Final diagnoses:  None    Rx / DC Orders ED Discharge Orders     None         Spence Dux, PA-C 08/24/23 1478    Palumbo, April, MD 08/28/23 0002

## 2023-08-24 NOTE — Progress Notes (Signed)
 PHARMACY - ANTICOAGULATION CONSULT NOTE  Pharmacy Consult for heparin  Indication: VTE treatment  No Known Allergies  Patient Measurements:    Vital Signs: Temp: 98.2 F (36.8 C) (05/02 0120) Temp Source: Oral (05/02 0120) BP: 120/65 (05/02 0145) Pulse Rate: 85 (05/02 0145)  Labs: Recent Labs    08/23/23 2158 08/23/23 2308  HGB 11.1*  --   HCT 35.8*  --   PLT 265  --   CREATININE 0.97  --   CKTOTAL  --  499*    Estimated Creatinine Clearance: 120.8 mL/min (by C-G formula based on SCr of 0.97 mg/dL).   Medical History: Past Medical History:  Diagnosis Date   Anxiety    Arthritis    Complication of anesthesia    Fingers numb after surgery   Depression    Diabetes mellitus type 2 in obese 01/15/2021   Elevated blood pressure reading without diagnosis of hypertension    Fatty liver    Gout    History of kidney stones    Hypertension    Impaired fasting blood sugar    Obesity    OSA (obstructive sleep apnea)      Assessment: 48 yo male with  recent hip arthroplasty presents with lower leg swelling. Pharmacy to dose heparin , no prior AC noted  Hgb 11.1, plts 265, Scr 0.97  Goal of Therapy:  Heparin  level 0.3-0.7 units/ml Monitor platelets by anticoagulation protocol: Yes   Plan:  Heparin  bolus 4000 units x 1 Then start heparin  drip at 1800 units/hr Heparin  level in 8 hours Daily CBC  Beau Bound RPh 08/24/2023, 2:13 AM

## 2023-08-25 ENCOUNTER — Inpatient Hospital Stay (HOSPITAL_COMMUNITY)

## 2023-08-25 DIAGNOSIS — R0602 Shortness of breath: Secondary | ICD-10-CM | POA: Diagnosis not present

## 2023-08-25 DIAGNOSIS — I82413 Acute embolism and thrombosis of femoral vein, bilateral: Secondary | ICD-10-CM | POA: Diagnosis not present

## 2023-08-25 DIAGNOSIS — R9431 Abnormal electrocardiogram [ECG] [EKG]: Secondary | ICD-10-CM

## 2023-08-25 LAB — ECHOCARDIOGRAM COMPLETE
Area-P 1/2: 3.85 cm2
Calc EF: 60.3 %
S' Lateral: 2.9 cm
Single Plane A2C EF: 65.5 %
Single Plane A4C EF: 57.7 %
Weight: 4352.76 [oz_av]

## 2023-08-25 LAB — COMPREHENSIVE METABOLIC PANEL WITH GFR
ALT: 28 U/L (ref 0–44)
AST: 21 U/L (ref 15–41)
Albumin: 2.8 g/dL — ABNORMAL LOW (ref 3.5–5.0)
Alkaline Phosphatase: 70 U/L (ref 38–126)
Anion gap: 9 (ref 5–15)
BUN: 13 mg/dL (ref 6–20)
CO2: 26 mmol/L (ref 22–32)
Calcium: 8.6 mg/dL — ABNORMAL LOW (ref 8.9–10.3)
Chloride: 102 mmol/L (ref 98–111)
Creatinine, Ser: 0.87 mg/dL (ref 0.61–1.24)
GFR, Estimated: >60 mL/min (ref >60)
Glucose, Bld: 112 mg/dL — ABNORMAL HIGH (ref 70–99)
Potassium: 4.2 mmol/L (ref 3.5–5.1)
Sodium: 137 mmol/L (ref 135–145)
Total Bilirubin: 1 mg/dL (ref 0.0–1.2)
Total Protein: 6.6 g/dL (ref 6.5–8.1)

## 2023-08-25 LAB — CBC
HCT: 33.8 % — ABNORMAL LOW (ref 39.0–52.0)
Hemoglobin: 10.5 g/dL — ABNORMAL LOW (ref 13.0–17.0)
MCH: 26.3 pg (ref 26.0–34.0)
MCHC: 31.1 g/dL (ref 30.0–36.0)
MCV: 84.5 fL (ref 80.0–100.0)
Platelets: 265 10*3/uL (ref 150–400)
RBC: 4 MIL/uL — ABNORMAL LOW (ref 4.22–5.81)
RDW: 13.6 % (ref 11.5–15.5)
WBC: 12.6 10*3/uL — ABNORMAL HIGH (ref 4.0–10.5)
nRBC: 0 % (ref 0.0–0.2)

## 2023-08-25 LAB — CK: Total CK: 235 U/L (ref 49–397)

## 2023-08-25 LAB — HEPARIN LEVEL (UNFRACTIONATED): Heparin Unfractionated: 0.37 [IU]/mL (ref 0.30–0.70)

## 2023-08-25 NOTE — Progress Notes (Signed)
  Echocardiogram 2D Echocardiogram has been performed.  Frank Howe 08/25/2023, 1:01 PM

## 2023-08-25 NOTE — Discharge Summary (Signed)
 Physician Discharge Summary  Frank Howe ZOX:[id number] DOB: October 13, 1975 DOA: 08/23/2023  PCP: Frank Ivan, MD  Admit date: 08/23/2023 Discharge date: 08/25/2023  Time spent: 40 minutes  Recommendations for Outpatient Follow-up:  Follow outpatient   Follow right thigh wound outpatient with PCP/orthopedics   Discharge Diagnoses:  Principal Problem:   Acute bilateral deep vein thrombosis (DVT) of femoral veins (HCC) Active Problems:   Gout, chronic   OSA (obstructive sleep apnea)   Essential hypertension   Type 2 diabetes mellitus with obesity (HCC)   Obesity, Class III, BMI 40-49.9 (morbid obesity)   Moderate protein malnutrition (HCC)   Hypokalemia   Elevated CK   Cellulitis of right lower extremity   Discharge Condition: stable  Diet recommendation: diabetes  Filed Weights   08/24/23 0450 08/25/23 0454  Weight: 123.8 kg 123.4 kg    History of present illness:   48 y.o. male with medical history significant of anxiety, osteoarthritis, depression, type 2 diabetes, elevated BP, fatty liver, gout, nephrolithiasis, hypertension impaired fasting blood glucose, obesity, struct of sleep apnea who underwent an anterior right total hip arthroplasty on 08/20/2023 who has presented to the emergency department with right lower extremity edema, associated with tenderness and muscle cramping.   Admitted for CT findings concerning for DVT.  LE US  was negative for DVT.  History suggests cold injury due to prolonged exposure to ice pack.    See below for additional details   Hospital Course:  Assessment and Plan:  Cold Injury Right Thigh Bullae  2 large areas of blistering sparing the area where his dressing was.  He notes these 2 areas were the location he had his ice packs and he left these in place directly on the skin, sleeping with the ice packs.   Continue to monitor outpatient, use nonstick dressing, keep clean and dry.  Needs close follow up outpatient to follow the evolution  of his wound.    Lower Extremity Swelling CT without arterial occlusion or stenosis, edema in R hip and upper thigh (post op edema vs cellulitis) Negative LE US  for DVT  Gout Home meds  HTN Amlodipine , losartan , hydrochlorothiazide , coreg   T2DM Metformin, ozempic  Obesity Body mass index is 40.17 kg/m.     Procedures:  Impression:   Negative for DVT bilaterally.      Consultations: orthopedics  Discharge Exam: Vitals:   08/24/23 2026 08/25/23 0453  BP: 113/62 125/78  Pulse: 70 65  Resp:  20  Temp: 98.3 F (36.8 C) (!) 97.4 F (36.3 C)  SpO2: 96% 97%   No new complaints  General: No acute distress. Cardiovascular: RRR Lungs: unlabored Neurological: Alert and oriented 3. Moves all extremities 4. Cranial nerves II through XII grossly intact. Skin: dark blistering to 2 large regions of his thigh - several bulla, sparing the area where his dressing was previously in place Extremities: No clubbing or cyanosis. No edema  Discharge Instructions   Discharge Instructions     Call MD for:  difficulty breathing, headache or visual disturbances   Complete by: As directed    Call MD for:  extreme fatigue   Complete by: As directed    Call MD for:  hives   Complete by: As directed    Call MD for:  persistant dizziness or light-headedness   Complete by: As directed    Call MD for:  persistant nausea and vomiting   Complete by: As directed    Call MD for:  redness, tenderness, or signs of infection (  pain, swelling, redness, odor or green/yellow discharge around incision site)   Complete by: As directed    Call MD for:  severe uncontrolled pain   Complete by: As directed    Call MD for:  temperature >100.4   Complete by: As directed    Diet - low sodium heart healthy   Complete by: As directed    Discharge instructions   Complete by: As directed    You were seen for blistering and skin changes to your right thigh.  This was due to your use of ice packs  and the development of Frank Howe cold injury "burn" to your skin (in the future, place Frank Howe towel between the ice and your skin and limit the length of application to 20 minutes at [name] time).   Avoid further ice or heat to the affected areas.  The wound will progress with the blisters and will likely develop into an open wound at some point.  Keep the wound clean and dry.  Use nonstick dressings to help keep the wound clean and dry, change that daily.  Follow up with your PCP or orthopedics for further wound care.    Return for new, recurrent, or worsening symptoms.  Please ask your PCP to request records from this hospitalization so they know what was done and what the next steps will be.   Increase activity slowly   Complete by: As directed       Allergies as of 08/25/2023   No Known Allergies      Medication List     TAKE these medications    amLODipine  10 MG tablet Commonly known as: NORVASC  Take 10 mg by mouth daily.   aspirin  EC 81 MG tablet Take 1 tablet (81 mg total) by mouth 2 (two) times daily.   BENGAY EX Apply 1 Application topically daily as needed (pain).   carvedilol  3.125 MG tablet Commonly known as: COREG  Take 3.125 mg by mouth 2 (two) times daily.   colchicine  0.6 MG tablet Take 1 tablet (0.6 mg total) by mouth daily as needed (gout flare).   loperamide  2 MG tablet Commonly known as: IMODIUM  Frank Howe Take 2 tablets (4 mg total) by mouth 4 (four) times daily as needed for diarrhea or loose stools.   losartan -hydrochlorothiazide  100-12.5 MG tablet Commonly known as: HYZAAR Take 1 tablet by mouth daily.   meloxicam  7.5 MG tablet Commonly known as: MOBIC  Take 7.5 mg by mouth 2 (two) times daily.   metFORMIN 500 MG 24 hr tablet Commonly known as: GLUCOPHAGE-XR Take 500 mg by mouth at bedtime.   NON FORMULARY Pt uses Frank Howe c-pap nightly   ondansetron  8 MG disintegrating tablet Commonly known as: ZOFRAN -ODT Take 1 tablet (8 mg total) by mouth every 8 (eight) hours as  needed for nausea or vomiting.   oxyCODONE -acetaminophen  5-325 MG tablet Commonly known as: PERCOCET/ROXICET Take 1 tablet by mouth every 4 (four) hours as needed for severe pain (pain score 7-10).   Ozempic (2 MG/DOSE) 8 MG/3ML Sopn Generic drug: Semaglutide (2 MG/DOSE) Inject 2 mg into the skin once Dreshaun Stene week.   rosuvastatin 10 MG tablet Commonly known as: CRESTOR Take 10 mg by mouth at bedtime.   tiZANidine  2 MG tablet Commonly known as: ZANAFLEX  Take 1 tablet (2 mg total) by mouth every 6 (six) hours as needed for muscle spasms.   Vitamin D (Ergocalciferol) 1.25 MG (50000 UNIT) Caps capsule Commonly known as: DRISDOL Take 50,000 Units by mouth 2 (two) times Lynette Topete week. Monday  and Friday       No Known Allergies    The results of significant diagnostics from this hospitalization (including imaging, microbiology, ancillary and laboratory) are listed below for reference.    Significant Diagnostic Studies: VAS US  LOWER EXTREMITY VENOUS (DVT) Result Date: 08/25/2023  Lower Venous DVT Study Patient Name:  Frank Howe  Date of Exam:   08/24/2023 Medical Rec #: [id number]      Accession #:    [id number] Date of Birth: Aug 19, 1975     Patient Gender: M Patient Age:   48 years Exam Location:  [facility] Procedure:      VAS US  LOWER EXTREMITY VENOUS (DVT) Referring Phys: DAVID ORTIZ --------------------------------------------------------------------------------  Indications: Edema.  Risk Factors: Surgery Hip arthroplasty 08/20/23 obesity. Limitations: Body habitus. Comparison Study: None. Performing Technologist: Estanislao Heimlich  Examination Guidelines: Swade Shonka complete evaluation includes B-mode imaging, spectral Doppler, color Doppler, and power Doppler as needed of all accessible portions of each vessel. Bilateral testing is considered an integral part of Abhinav Mayorquin complete examination. Limited examinations for reoccurring indications may be performed as noted. The reflux portion of the exam is  performed with the patient in reverse Trendelenburg.  +---------+---------------+---------+-----------+----------+---------------+ RIGHT    CompressibilityPhasicitySpontaneityPropertiesThrombus Aging  +---------+---------------+---------+-----------+----------+---------------+ CFV      Full                                                         +---------+---------------+---------+-----------+----------+---------------+ SFJ      Full                                                         +---------+---------------+---------+-----------+----------+---------------+ FV Prox  Full                                                         +---------+---------------+---------+-----------+----------+---------------+ FV Mid                                                Patent by color +---------+---------------+---------+-----------+----------+---------------+ FV Distal                                             Patent by color +---------+---------------+---------+-----------+----------+---------------+ PFV      Full                                                         +---------+---------------+---------+-----------+----------+---------------+ POP      Full                                                         +---------+---------------+---------+-----------+----------+---------------+  PTV      Full                                                         +---------+---------------+---------+-----------+----------+---------------+ PERO                                                  Patent by color +---------+---------------+---------+-----------+----------+---------------+   +---------+---------------+---------+-----------+----------+--------------+ LEFT     CompressibilityPhasicitySpontaneityPropertiesThrombus Aging +---------+---------------+---------+-----------+----------+--------------+ CFV      Full           Yes      Yes                                  +---------+---------------+---------+-----------+----------+--------------+ SFJ      Full                                                        +---------+---------------+---------+-----------+----------+--------------+ FV Prox  Full                                                        +---------+---------------+---------+-----------+----------+--------------+ FV Mid   Full                                                        +---------+---------------+---------+-----------+----------+--------------+ FV DistalFull                                                        +---------+---------------+---------+-----------+----------+--------------+ PFV      Full                                                        +---------+---------------+---------+-----------+----------+--------------+ POP      Full           Yes      Yes                                 +---------+---------------+---------+-----------+----------+--------------+ PTV      Full                    Yes                                 +---------+---------------+---------+-----------+----------+--------------+  PERO     Full                    Yes                                 +---------+---------------+---------+-----------+----------+--------------+    Impression: Negative for DVT bilaterally.  *See table(s) above for measurements and observations. Electronically signed by Delaney Fearing on 08/24/2023 at 1:38:12 PM. Report was modified by Delaney Fearing on 08/25/2023 10:43:34 AM due to no impression.    Final (Updated)    CT ANGIO AO+BIFEM W & OR WO CONTRAST Addendum Date: 08/24/2023 ADDENDUM REPORT: 08/24/2023 03:30 ADDENDUM: Thrombus is suspected in the superficial femoral veins, not in the arteries as incorrectly stated in the body of the report and in the 2nd impression. Apologies for the error. Electronically Signed   By: Denman Fischer M.D.   On: 08/24/2023 03:30    Result Date: 08/24/2023 CLINICAL DATA:  Right total hip arthroplasty done 08/20/2023, claudication or leg ischemia suspected with lower extremity pain and diminished pulses distally with blistering rash in the right lower extremity. EXAM: CT ANGIOGRAPHY OF ABDOMINAL AORTA WITH ILIOFEMORAL RUNOFF TECHNIQUE: Multidetector CT imaging of the abdomen, pelvis and lower extremities was performed using the standard protocol during bolus administration of intravenous contrast. Multiplanar CT image reconstructions and MIPs were obtained to evaluate the vascular anatomy. RADIATION DOSE REDUCTION: This exam was performed according to the departmental dose-optimization program which includes automated exposure control, adjustment of the mA and/or kV according to patient size and/or use of iterative reconstruction technique. CONTRAST:  OMNIPAQUE  IOHEXOL  350 MG/ML SOLN COMPARISON:  No prior similar CTA study. Comparison is made with CT abdomen and pelvis with IV contrast 01/14/2021. FINDINGS: VASCULAR Aorta: Normal caliber aorta without aneurysm, dissection, vasculitis or significant stenosis. There is Shenise Wolgamott mild amount of scattered nonstenosing calcific plaque. Celiac: Patent without evidence of aneurysm, dissection, vasculitis or significant stenosis. SMA: Patent without evidence of aneurysm, dissection, vasculitis or significant stenosis. Renals: Both are single. Both are normal. No hilar branch occlusion is seen. IMA: Patent without evidence of aneurysm, dissection, vasculitis or significant stenosis. RIGHT Lower Extremity Inflow: Common, internal and external iliac arteries are patent without evidence of aneurysm, dissection, vasculitis or significant stenosis. There are mild calcific plaques in the distal common iliac artery but not in the internal or external iliac arteries. Outflow: Common, superficial and profunda femoral arteries and the popliteal artery are patent without evidence of aneurysm, dissection,  vasculitis or significant stenosis. Runoff: Patent three vessel runoff to the ankle. LEFT Lower Extremity Inflow: Common, internal and external iliac arteries are patent without evidence of aneurysm, dissection, vasculitis or significant stenosis. There are mild calcific plaques in the mid to distal common iliac artery but not in the internal or external iliac arteries. Outflow: Common, superficial and profunda femoral arteries and the popliteal artery are patent without evidence of aneurysm, dissection, vasculitis or significant stenosis. There is trace calcific plaque in the distal superficial femoral artery no other visible plaque disease. Runoff: Patent three vessel runoff to the ankle. Veins: Suspected partially occlusive hypodense thrombus in both superficial femoral arteries, on the left ending in the popliteal artery, on the right extending into the posterior tibial veins. Bilateral lower extremity DVT ultrasound Doppler study is recommended. No other significant venous findings on this arterial phase exam. Normal hepatic portal vein and IVC. Pelvic veins are not opacified and are mostly  obscured by bilateral hip replacements. Review of the MIP images confirms the above findings. NON-VASCULAR Lower chest: The heart is slightly enlarged.  Lung bases are clear. Hepatobiliary: Loss of fine detail due to respiratory motion. The liver is mildly steatotic measuring 21 cm length. No obvious mass. The gallbladder and bile ducts unremarkable, as visualized. Pancreas: No abnormality is seen through the breathing motion. Spleen: No abnormality is seen through the breathing motion. Adrenals/Urinary Tract: No abnormality is seen through breathing motion. Please note the bladder is obscured by bilateral hip arthroplasties. Stomach/Bowel: No dilatation or wall thickening as far as seen. Normal appendix. Mild fecal stasis. Uncomplicated sigmoid diverticulosis. The rectum is obscured by the hip replacements. Lymphatic: No  appreciable adenopathy. Reproductive: Prostate obscured by bilateral hip replacements. Both testicles are in the scrotal sac. Other: Small umbilical fat hernia. No incarcerated hernia. Small inguinal fat hernias. No free fluid or free air. Musculoskeletal: Degenerative change thoracic and lumbar spine. Mild to moderate arthrosis of the bilateral knees. In the lower extremities, there is normal muscle bulk bilaterally. There is superficial edema in the right hip area and upper thigh, trace subcutaneous soft tissue air at the level of surgery. Findings could be normal postoperative edema or cellulitis. There is no localizing collection. No deep space edema or fluid collections and no deep space free air identified in the right lower extremity. There are mild bilateral features of midfoot arthrosis and bilateral calcaneal enthesopathy. Also noted is bilateral hallux valgus and first MTP DJD. IMPRESSION: 1. No arterial occlusion or significant stenosis is seen. Mild atherosclerosis as described above. 2. Suspected partially occlusive hypodense thrombus in both superficial femoral arteries, on the left ending in the popliteal artery, on the right extending into the posterior tibial veins. Bilateral lower extremity DVT ultrasound Doppler study is recommended. 3. Superficial edema in the right hip area and upper thigh, with trace subcutaneous soft tissue air at the level of surgery. Findings could be normal postoperative edema or cellulitis. No localizing collection. 4. No deep space edema or fluid collections and no deep space free air identified in the right lower extremity. 5. Mild cardiomegaly. 6. Mild hepatic steatosis. 7. Constipation and diverticulosis. 8. Small umbilical and inguinal fat hernias. 9. Degenerative changes of the spine, knees, midfoot, and feet. 10. 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 Electronically Signed: By: Denman Fischer M.D. On: 08/24/2023 01:29   DG HIP UNILAT WITH PELVIS 1V  RIGHT Result Date: 08/20/2023 CLINICAL DATA:  Right anterior hip replacement. Intraoperative imaging. EXAM: DG HIP (WITH OR WITHOUT PELVIS) 1V RIGHT COMPARISON:  Radiographs 07/13/2021. Intraoperative radiographs of the left hip 05/21/2023. FINDINGS: C-arm fluoroscopy was provided in the operating room without the presence of Shardae Kleinman radiologist.12 seconds fluoroscopy time. 2.716 mGy air kerma. Two C-arm fluoroscopic images were obtained intraoperatively and are submitted for post operative interpretation. These demonstrate interval right total hip arthroplasty without apparent hardware complication. Surgical sponge(s) remain in the operative bed. Please see intraoperative findings for further detail. IMPRESSION: Intraoperative imaging during right total hip arthroplasty. Correlate clinically and consider radiographic follow-up to exclude retained surgical sponge. Electronically Signed   By: Elmon Hagedorn M.D.   On: 08/20/2023 12:30   DG C-Arm 1-60 Min-No Report Result Date: 08/20/2023 Fluoroscopy was utilized by the requesting physician.  No radiographic interpretation.    Microbiology: Recent Results (from the past 240 hours)  Blood culture (routine x 2)     Status: None (Preliminary result)   Collection Time: 08/24/23 12:15 AM   Specimen:  BLOOD RIGHT ARM  Result Value Ref Range Status   Specimen Description   Final    BLOOD RIGHT ARM Performed at [facility], [address] [place], [place] [address]    Special Requests   Final    BOTTLES DRAWN AEROBIC AND ANAEROBIC Blood Culture results may not be optimal due to an inadequate volume of blood received in culture bottles Performed at [facility], [address] [place], [place] [address]    Culture   Final    NO GROWTH 1 DAY Performed at [facility], [address], [place], [place] [address]    Report Status PENDING  Incomplete  Blood culture (routine x 2)     Status: None (Preliminary result)    Collection Time: 08/24/23  1:11 AM   Specimen: BLOOD LEFT FOREARM  Result Value Ref Range Status   Specimen Description   Final    BLOOD LEFT FOREARM Performed at [facility], [address], [place], [place] [address]    Special Requests   Final    BOTTLES DRAWN AEROBIC AND ANAEROBIC Blood Culture results may not be optimal due to an inadequate volume of blood received in culture bottles Performed at [facility], [address], [place], [place] [address]    Culture   Final    NO GROWTH 1 DAY Performed at [facility], [address], [place], [place] [address]    Report Status PENDING  Incomplete     Labs: Basic Metabolic Panel: Recent Labs  Lab 08/23/23 2158 08/24/23 0510 08/25/23 0243  NA 135 138 137  K 3.2* 3.5 4.2  CL 100 102 102  CO2 24 27 26   GLUCOSE 157* 105* 112*  BUN 17 16 13   CREATININE 0.97 1.06 0.87  CALCIUM 8.3* 8.3* 8.6*  MG  --  1.7  --    Liver Function Tests: Recent Labs  Lab 08/23/23 2158 08/24/23 0510 08/25/23 0243  AST 23 20 21   ALT 25 22 28   ALKPHOS 75 73 70  BILITOT 0.8 1.0 1.0  PROT 6.9 6.4* 6.6  ALBUMIN 3.1* 2.9* 2.8*   No results for input(s): "LIPASE", "AMYLASE" in the last 168 hours. No results for input(s): "AMMONIA" in the last 168 hours. CBC: Recent Labs  Lab 08/23/23 2158 08/25/23 0243  WBC 13.8* 12.6*  NEUTROABS 8.3*  --   HGB 11.1* 10.5*  HCT 35.8* 33.8*  MCV 84.2 84.5  PLT 265 265   Cardiac Enzymes: Recent Labs  Lab 08/23/23 2308 08/24/23 0510 08/25/23 0243  CKTOTAL 499* 410* 235   BNP: BNP (last 3 results) No results for input(s): "BNP" in the last 8760 hours.  ProBNP (last 3 results) No results for input(s): "PROBNP" in the last 8760 hours.  CBG: Recent Labs  Lab 08/20/23 0609 08/20/23 0930 08/24/23 1209 08/24/23 1715 08/24/23 2102  GLUCAP 108* 106* 98 141* 100*       Signed:  Donnetta Gains MD.  [facility] 08/25/2023, 11:33 AM

## 2023-08-25 NOTE — Progress Notes (Addendum)
 PHARMACY - ANTICOAGULATION CONSULT NOTE  Pharmacy Consult for heparin  Indication: VTE treatment  No Known Allergies  Patient Measurements: Weight: 123.8 kg (272 lb 14.9 oz)  Vital Signs: Temp: 98.3 F (36.8 C) (05/02 2026) Temp Source: Oral (05/02 2026) BP: 113/62 (05/02 2026) Pulse Rate: 70 (05/02 2026)  Labs: Recent Labs    08/23/23 2158 08/23/23 2308 08/24/23 0510 08/24/23 0928 08/24/23 1828 08/25/23 0243  HGB 11.1*  --   --   --   --  10.5*  HCT 35.8*  --   --   --   --  33.8*  PLT 265  --   --   --   --  265  HEPARINUNFRC  --   --   --  0.36 0.29* 0.37  CREATININE 0.97  --  1.06  --   --  0.87  CKTOTAL  --  499* 410*  --   --  235    Estimated Creatinine Clearance: 136.4 mL/min (by C-G formula based on SCr of 0.87 mg/dL).  Assessment: 48 yo male with recent hip arthroplasty presents with lower leg swelling; concern for bilateral LE DVTs based on outpatient CTA. Pharmacy to dose heparin , no prior AC noted.    Today, 08/25/2023: HL 0.37 therapeutic on 1900 units/hr Hgb 10.5, plts 265 No bleeding noted per RN  Goal of Therapy: Heparin  level 0.3-0.7 units/ml Monitor platelets by anticoagulation protocol: Yes  Plan: continue heparin  IV infusion at 1900 units/hr Confirmatory heparin  level in 6 hours Monitor daily heparin  level, CBC, signs/symptoms of bleeding   Thank you for allowing pharmacy to be a part of this patient's care.  Beau Bound RPh 08/25/2023, 3:40 AM

## 2023-08-25 NOTE — Progress Notes (Signed)
 Vascular update:  DVT study was updated to have an impression, he is negative for DVT in bilateral upper extremities.  I also reviewed the CTA which demonstrated some nondiagnostic filling deficits in the lower extremity venous system although this is not as sensitive as a DVT study as the CTA is arterially timed. The patient is negative for DVT  Philipp Brawn MD [facility] Phone Number: [phone] 08/25/2023 10:49 AM

## 2023-08-25 NOTE — Progress Notes (Signed)
 PATIENT ID: Frank Howe  MRN: [id number]  DOB/AGE:  August 15, 1975 / 48 y.o.        PROGRESS NOTE Subjective: Patient is alert, oriented, no Nausea, no Vomiting, yes passing gas, . Taking PO well. Denies SOB, Chest or Calf Pain. Using [employer] . Ambulate WBAT Patient reports pain as  mild.    Objective: Vital signs in last 24 hours: Vitals:   08/24/23 1742 08/24/23 2026 08/25/23 0453 08/25/23 0454  BP: (!) 154/66 113/62 125/78   Pulse: 75 70 65   Resp:   20   Temp: 98.3 F (36.8 C) 98.3 F (36.8 C) (!) 97.4 F (36.3 C)   TempSrc: Oral Oral Oral   SpO2: 97% 96% 97%   Weight:    123.4 kg      Intake/Output from previous day: I/O last 3 completed shifts: In: 2541.2 [P.O.:580; I.V.:766.4; IV Piggyback:1194.8] Out: 1900 [Urine:1900]   Intake/Output this shift: No intake/output data recorded.   LABORATORY DATA: Recent Labs    08/23/23 2158 08/24/23 0510 08/24/23 1209 08/24/23 1715 08/24/23 2102 08/25/23 0243  WBC 13.8*  --   --   --   --  12.6*  HGB 11.1*  --   --   --   --  10.5*  HCT 35.8*  --   --   --   --  33.8*  PLT 265  --   --   --   --  265  NA 135 138  --   --   --  137  K 3.2* 3.5  --   --   --  4.2  CL 100 102  --   --   --  102  CO2 24 27  --   --   --  26  BUN 17 16  --   --   --  13  CREATININE 0.97 1.06  --   --   --  0.87  GLUCOSE 157* 105*  --   --   --  112*  GLUCAP  --   --  98 141* 100*  --   CALCIUM 8.3* 8.3*  --   --   --  8.6*    Examination: Neurologically intact Sensation intact distally Dorsiflexion/Plantar flexion intact Incision: dressing C/D/I and no drainage No cellulitis present Compartment soft} Pt does have blistering at a few spots.  Incision is healing nicely, no signs of infection  Assessment:   S/P right anterior total hip arthroplasty 08/20/23 ADDITIONAL DIAGNOSIS:  Expected Acute Blood Loss Anemia, Bilateral femoral vein DVT's  Plan: PT/OT WBAT, THA  DISCHARGE PLAN: Home, when cleared by medicine  service for DVT's  Follow up in office with Dr. Carry Clapper in 10 days.    Anticoagulation per medicine service

## 2023-08-29 LAB — CULTURE, BLOOD (ROUTINE X 2)
Culture: NO GROWTH
Culture: NO GROWTH

## 2023-08-31 ENCOUNTER — Ambulatory Visit: Admitting: Physical Therapy

## 2023-09-01 NOTE — Therapy (Incomplete)
 OUTPATIENT PHYSICAL THERAPY LOWER EXTREMITY EVALUATION   Patient Name: Frank Howe MRN: [id number] DOB:06-11-1975, 48 y.o., male Today's Date: 09/03/2023  END OF SESSION:   Past Medical History:  Diagnosis Date   Anxiety    Arthritis    Complication of anesthesia    Fingers numb after surgery   Depression    Diabetes mellitus type 2 in obese 01/15/2021   Elevated blood pressure reading without diagnosis of hypertension    Fatty liver    Gout    History of kidney stones    Hypertension    Impaired fasting blood sugar    Obesity    OSA (obstructive sleep apnea)    Past Surgical History:  Procedure Laterality Date   TOTAL HIP ARTHROPLASTY Left 05/21/2023   Procedure: LEFT TOTAL HIP ARTHROPLASTY ANTERIOR APPROACH;  Surgeon: Wendolyn Hamburger, MD;  Location: [facility];  Service: Orthopedics;  Laterality: Left;   TOTAL HIP ARTHROPLASTY Right 08/20/2023   Procedure: ARTHROPLASTY, HIP, TOTAL, ANTERIOR APPROACH;  Surgeon: Wendolyn Hamburger, MD;  Location: [facility];  Service: Orthopedics;  Laterality: Right;  RIGHT TOTAL HIP ARTHROPLASTY ANTERIOR   UMBILICAL HERNIA REPAIR N/A 09/14/2017   Procedure: LAPAROSCOPIC UMBILICAL HERNIA;  Surgeon: Joyce Nixon, MD;  Location: [facility];  Service: General;  Laterality: N/A;   UMBILICAL HERNIA REPAIR N/A 02/11/2018   Procedure: LAPARASCOPIC REDO REPAIR OF RECURRENT UMBILICAL HERNIA WITH INSERTION OF MESH, LYSIS OF ADHESIONS;  Surgeon: Melvenia Stabs, MD;  Location: [facility];  Service: General;  Laterality: N/A;   XI ROBOTIC ASSISTED VENTRAL HERNIA N/A 03/01/2021   Procedure: ROBOTIC RECURRENT INCISIONAL HERNIA 9CMx9CM REPAIR WITH MESH AND REMOVAL OF INTRAPERITONEAL MESH;  Surgeon: Junie Olds, MD;  Location: [facility];  Service: General;  Laterality: N/A;   Patient Active Problem List   Diagnosis Date Noted   Acute bilateral deep vein thrombosis (DVT) of femoral veins (HCC) 08/24/2023   Moderate protein malnutrition (HCC) 08/24/2023   Hypokalemia  08/24/2023   Elevated CK 08/24/2023   Cellulitis of right lower extremity 08/24/2023   Osteoarthritis of right hip 08/15/2023   Arthritis of left hip 05/17/2023   Recurrent incisional hernia 03/01/2021   SBO (small bowel obstruction) (HCC) 01/15/2021   Essential hypertension 01/15/2021   Type 2 diabetes mellitus with obesity (HCC) 01/15/2021   Obesity, Class III, BMI 40-49.9 (morbid obesity) 01/15/2021   Umbilical hernia with obstruction but no gangrene 02/11/2018   Partial small bowel obstruction (HCC) 09/13/2017   Gout, chronic 07/19/2015   OSA (obstructive sleep apnea) 07/19/2015   Obesity 06/29/2015   Elevated uric acid in blood 06/29/2015   Impaired fasting blood sugar 06/28/2015   Knee swelling 06/28/2015   Right knee pain 06/28/2015   Elevated blood pressure reading without diagnosis of hypertension 06/28/2015   Routine general medical examination at a health care facility 08/21/2011    PCP: Allene Ivan, MD   REFERRING PROVIDER: Wendolyn Hamburger, MD   REFERRING DIAG: s/p R THA  THERAPY DIAG:  No diagnosis found.  Rationale for Evaluation and Treatment: Rehabilitation  ONSET DATE: 08/20/23 R THA  SUBJECTIVE:   SUBJECTIVE STATEMENT: Pt arrives s/p R THA 08/20/23. Was hospitalized after surgery out of concern for DVT, extensive work up performed; per 08/25/23 discharge summary:  "Admitted for CT findings concerning for DVT. LE US  was negative for DVT. History suggests cold injury due to prolonged exposure to ice pack." ***  PERTINENT HISTORY: anxiety/depression, DM2, gout, HTN, OSA, BIL THA  PAIN:  Are you having pain: ***  Location/description: *** Best-worst over past week: ***  - aggravating factors: *** - Easing factors: ***    PRECAUTIONS: anterior hip   RED FLAGS: {PT Red Flags:29287}   WEIGHT BEARING RESTRICTIONS: Yes WBAT  FALLS:  Has patient fallen in last 6 months? {fallsyesno:27318}  LIVING ENVIRONMENT: Lives w/ spouse; requires stair  navigation  OCCUPATION: not working ***   PLOF: Independent  PATIENT GOALS: ***  NEXT MD VISIT: ***  OBJECTIVE:  Note: Objective measures were completed at Evaluation unless otherwise noted.  DIAGNOSTIC FINDINGS:  CT angio 08/24/23: "IMPRESSION: 1. No arterial occlusion or significant stenosis is seen. Mild atherosclerosis as described above. 2. Suspected partially occlusive hypodense thrombus in both superficial femoral arteries, on the left ending in the popliteal artery, on the right extending into the posterior tibial veins. Bilateral lower extremity DVT ultrasound Doppler study is recommended. 3. Superficial edema in the right hip area and upper thigh, with trace subcutaneous soft tissue air at the level of surgery. Findings could be normal postoperative edema or cellulitis. No localizing collection. 4. No deep space edema or fluid collections and no deep space free air identified in the right lower extremity. 5. Mild cardiomegaly. 6. Mild hepatic steatosis. 7. Constipation and diverticulosis. 8. Small umbilical and inguinal fat hernias. 9. Degenerative changes of the spine, knees, midfoot, and feet. 10. 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 ADDENDUM: Thrombus is suspected in the superficial femoral veins, not in the arteries as incorrectly stated in the body of the report and in the 2nd impression. Apologies for the error."  PATIENT SURVEYS:  LEFS: ***   COGNITION: Overall cognitive status: Within functional limits for tasks assessed     SENSATION: {sensation:27233}  EDEMA/INSPECTION:  {edema:24020}    LOWER EXTREMITY ROM:      Right eval Left eval  Hip flexion    Hip extension    Hip internal rotation    Hip external rotation    Knee extension    Knee flexion    (Blank rows = not tested) (Key: WFL = within functional limits not formally assessed, * = concordant pain, s = stiffness/stretching sensation, NT = not tested)  Comments: deferred  given acuity of surgery  LOWER EXTREMITY MMT:    MMT Right eval Left eval  Hip flexion    Hip abduction (modified sitting)    Hip internal rotation    Hip external rotation    Knee flexion    Knee extension    Ankle dorsiflexion     (Blank rows = not tested) (Key: WFL = within functional limits not formally assessed, * = concordant pain, s = stiffness/stretching sensation, NT = not tested)  Comments: deferred on eval given proximity to surgery   FUNCTIONAL TESTS:  TUG: *** 5xSTS: ***  GAIT: Distance walked: within clinic Assistive device utilized: {Assistive devices:23999} Level of assistance: Modified independence Comments: ***  TREATMENT DATE:  [facility] Adult PT Treatment:                                                DATE: 09/01/23 Therapeutic Exercise: STS, quad set, glute set, ankle pumps *** HEP handout + education  Self Care: Post op restrictions, activity modification, monitoring for red flags and appropriate action, communication w/ provider as indicated ***      PATIENT EDUCATION:  Education details: Pt education on PT impairments, prognosis, and POC. Informed consent. Rationale for interventions, safe/appropriate HEP performance, post op restrictions Person educated: Patient Education method: Explanation, Demonstration, Tactile cues, Verbal cues Education comprehension: verbalized understanding, returned demonstration, verbal cues required, tactile cues required, and needs further education    HOME EXERCISE PROGRAM: ***  ASSESSMENT:  CLINICAL IMPRESSION: Patient is a 48 y.o. gentleman who was seen today for physical therapy evaluation and treatment for R THA DOS 08/20/23. Pt was admitted to hospital after initial discharge over concern for skin integrity and swelling, new referral received from surgical team for OP PT. Pt endorses  limitations in ADLs/mobility as expected post op, requiring ***. Deferring focal ROM/MMT given acuity of surgery. On functional assessment pt demonstrates alterations in gait/transfer kinematics as noted above, 5xSTS and TUG both indicative of ***. No adverse events, tolerates exam/HEP well overall with education as above. Recommend skilled PT to address aforementioned deficits with aim of improving functional tolerance and reducing pain with typical activities. Pt departs today's session in no acute distress, all voiced concerns/questions addressed appropriately from PT perspective.    OBJECTIVE IMPAIRMENTS: Abnormal gait, decreased activity tolerance, decreased balance, decreased endurance, decreased mobility, difficulty walking, decreased ROM, decreased strength, impaired perceived functional ability, improper body mechanics, postural dysfunction, and pain.   ACTIVITY LIMITATIONS: carrying, lifting, bending, standing, squatting, stairs, transfers, and locomotion level  PARTICIPATION LIMITATIONS: meal prep, cleaning, laundry, driving, and community activity  PERSONAL FACTORS: Age, Time since onset of injury/illness/exacerbation, and 3+ comorbidities: anxiety/depression, DM2, gout, HTN, OSA, BIL THA are also affecting patient's functional outcome.   REHAB POTENTIAL: {rehabpotential:25112}  CLINICAL DECISION MAKING: {clinical decision making:25114}  EVALUATION COMPLEXITY: {Evaluation complexity:25115}   GOALS: Goals reviewed with patient? {yes/no:20286}  SHORT TERM GOALS: Target date: 10/15/2023  Pt will demonstrate appropriate understanding and performance of initially prescribed HEP in order to facilitate improved independence with management of symptoms.  Baseline: HEP ***  Goal status: INITIAL   2. Pt will report at least 25% improvement in overall pain levels over past week in order to facilitate improved tolerance to typical daily activities.   Baseline: ***  Goal status: INITIAL     LONG TERM GOALS: Target date: 11/26/2023  Pt will score *** or greater on LEFS in order to demonstrate improved perception of function due to symptoms (MCID 9 pts) Baseline: *** Goal status: INITIAL  2.  Pt will demonstrate at least *** degrees of *** AROM in order to facilitate improved tolerance to functional movements such as ***.  Baseline: see ROM chart above Goal status: INITIAL  3.  Pt will be able to lift up to *** in order to demonstrate improved capacity for daily activities such as ***.  Baseline: *** Goal status: INITIAL  4.  Pt will be able to perform 5xSTS in less than or equal to *** in order to demonstrate reduced fall risk and improved functional independence (MCID 5xSTS =  2.3 sec). Baseline: *** Goal status: INITIAL    PLAN:  PT FREQUENCY: {rehab frequency:25116}  PT DURATION: 12 weeks  PLANNED INTERVENTIONS: 97164- PT Re-evaluation, 97750- Physical Performance Testing, 97110-Therapeutic exercises, 97530- Therapeutic activity, [id number]- Neuromuscular re-education, 97535- Self Care, 16109- Manual therapy, [phone]- Gait training, Patient/Family education, Balance training, Stair training, Taping, Dry Needling, Joint mobilization, Cryotherapy, and Moist heat  PLAN FOR NEXT SESSION: Review/update HEP PRN. Work on [employer] exercises as appropriate with emphasis on ***. Symptom modification strategies as indicated/appropriate.    Lovett Ruck PT, DPT 09/03/2023 2:01 PM

## 2023-09-03 ENCOUNTER — Ambulatory Visit: Admitting: Physical Therapy

## 2023-09-05 NOTE — Therapy (Signed)
 OUTPATIENT PHYSICAL THERAPY LOWER EXTREMITY EVALUATION   Patient Name: Frank Howe MRN: [id number] DOB:03-May-1975, 48 y.o., male Today's Date: 09/07/2023  END OF SESSION:  PT End of Session - 09/07/23 0843     Visit Number 1    Number of Visits 17    Date for PT Re-Evaluation 11/02/23    Authorization Type [employer]    Authorization Time Period auth tbd    PT Start Time 4098   late check in   PT Stop Time 0916    PT Time Calculation (min) 32 min    Activity Tolerance Patient tolerated treatment well             Past Medical History:  Diagnosis Date   Anxiety    Arthritis    Complication of anesthesia    Fingers numb after surgery   Depression    Diabetes mellitus type 2 in obese 01/15/2021   Elevated blood pressure reading without diagnosis of hypertension    Fatty liver    Gout    History of kidney stones    Hypertension    Impaired fasting blood sugar    Obesity    OSA (obstructive sleep apnea)    Past Surgical History:  Procedure Laterality Date   TOTAL HIP ARTHROPLASTY Left 05/21/2023   Procedure: LEFT TOTAL HIP ARTHROPLASTY ANTERIOR APPROACH;  Surgeon: Wendolyn Hamburger, MD;  Location: [facility];  Service: Orthopedics;  Laterality: Left;   TOTAL HIP ARTHROPLASTY Right 08/20/2023   Procedure: ARTHROPLASTY, HIP, TOTAL, ANTERIOR APPROACH;  Surgeon: Wendolyn Hamburger, MD;  Location: [facility];  Service: Orthopedics;  Laterality: Right;  RIGHT TOTAL HIP ARTHROPLASTY ANTERIOR   UMBILICAL HERNIA REPAIR N/A 09/14/2017   Procedure: LAPAROSCOPIC UMBILICAL HERNIA;  Surgeon: Joyce Nixon, MD;  Location: [facility];  Service: General;  Laterality: N/A;   UMBILICAL HERNIA REPAIR N/A 02/11/2018   Procedure: LAPARASCOPIC REDO REPAIR OF RECURRENT UMBILICAL HERNIA WITH INSERTION OF MESH, LYSIS OF ADHESIONS;  Surgeon: Melvenia Stabs, MD;  Location: [facility];  Service: General;  Laterality: N/A;   XI ROBOTIC ASSISTED VENTRAL HERNIA N/A 03/01/2021   Procedure: ROBOTIC RECURRENT  INCISIONAL HERNIA 9CMx9CM REPAIR WITH MESH AND REMOVAL OF INTRAPERITONEAL MESH;  Surgeon: Junie Olds, MD;  Location: [facility];  Service: General;  Laterality: N/A;   Patient Active Problem List   Diagnosis Date Noted   Acute bilateral deep vein thrombosis (DVT) of femoral veins (HCC) 08/24/2023   Moderate protein malnutrition (HCC) 08/24/2023   Hypokalemia 08/24/2023   Elevated CK 08/24/2023   Cellulitis of right lower extremity 08/24/2023   Osteoarthritis of right hip 08/15/2023   Arthritis of left hip 05/17/2023   Recurrent incisional hernia 03/01/2021   SBO (small bowel obstruction) (HCC) 01/15/2021   Essential hypertension 01/15/2021   Type 2 diabetes mellitus with obesity (HCC) 01/15/2021   Obesity, Class III, BMI 40-49.9 (morbid obesity) 01/15/2021   Umbilical hernia with obstruction but no gangrene 02/11/2018   Partial small bowel obstruction (HCC) 09/13/2017   Gout, chronic 07/19/2015   OSA (obstructive sleep apnea) 07/19/2015   Obesity 06/29/2015   Elevated uric acid in blood 06/29/2015   Impaired fasting blood sugar 06/28/2015   Knee swelling 06/28/2015   Right knee pain 06/28/2015   Elevated blood pressure reading without diagnosis of hypertension 06/28/2015   Routine general medical examination at a health care facility 08/21/2011    PCP: Allene Ivan, MD   REFERRING PROVIDER: Wendolyn Hamburger, MD   REFERRING DIAG: s/p R THA Updated  referral 08/31/23 post hospitalization, see media tab  THERAPY DIAG:  Other abnormalities of gait and mobility  Muscle weakness (generalized)  Pain in right hip  Rationale for Evaluation and Treatment: Rehabilitation  ONSET DATE: 08/20/23 R THA  SUBJECTIVE:   SUBJECTIVE STATEMENT: Pt arrives s/p R THA 08/20/23. Was hospitalized after surgery out of concern for DVT, extensive work up performed; per 08/25/23 discharge summary:  "Admitted for CT findings concerning for DVT. LE US  was negative for DVT. History suggests cold  injury due to prolonged exposure to ice pack." Pt reports compliance w/ blood thinner overall. Arrives w/o AD. Has been using AD most of the time. Followed up with surgeon on May 8th, no concerns at that visit per pt. States he is navigating stairs at home but inc time/effort.  No fevers/chills, no new N/T (reports L thigh numbness with prior surgery). Still some swelling that fluctuates based on activity. No drainage or reported redness/streaking, states he has been monitoring frequently. No distal symptoms.   PERTINENT HISTORY: anxiety/depression, DM2, gout, HTN, OSA, BIL THA  PAIN:  Are you having pain: none at present w/ medication Location/description: R hip anteriorly,muscle spasms, stiffness Best-worst over past week: 0-8/10  - aggravating factors: initial walking and prolonged walking, transfers, stairs   - Easing factors: rest, medication     PRECAUTIONS: anterior hip per acute care notes, per pt physician cleared him from precautions at May 8th visit    WEIGHT BEARING RESTRICTIONS: Yes WBAT  FALLS:  Has patient fallen in last 6 months? No  LIVING ENVIRONMENT: Lives w/ spouse; requires stair navigation  OCCUPATION: not working currently - works in [employer], [employer], trying to get back by July   PLOF: Independent  PATIENT GOALS: get mobility and balance back to normal, swimming  NEXT MD VISIT: June or July   OBJECTIVE:  Note: Objective measures were completed at Evaluation unless otherwise noted.  DIAGNOSTIC FINDINGS:  CT angio 08/24/23: "IMPRESSION: 1. No arterial occlusion or significant stenosis is seen. Mild atherosclerosis as described above. 2. Suspected partially occlusive hypodense thrombus in both superficial femoral arteries, on the left ending in the popliteal artery, on the right extending into the posterior tibial veins. Bilateral lower extremity DVT ultrasound Doppler study is recommended. 3. Superficial edema in the right hip area and upper  thigh, with trace subcutaneous soft tissue air at the level of surgery. Findings could be normal postoperative edema or cellulitis. No localizing collection. 4. No deep space edema or fluid collections and no deep space free air identified in the right lower extremity. 5. Mild cardiomegaly. 6. Mild hepatic steatosis. 7. Constipation and diverticulosis. 8. Small umbilical and inguinal fat hernias. 9. Degenerative changes of the spine, knees, midfoot, and feet. 10. 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 ADDENDUM: Thrombus is suspected in the superficial femoral veins, not in the arteries as incorrectly stated in the body of the report and in the 2nd impression. Apologies for the error."  PATIENT SURVEYS:  LEFS: 48/80   COGNITION: Overall cognitive status: Within functional limits for tasks assessed     SENSATION: Pt reports chronic L thigh sensory issues, no new sensory issues RLE   EDEMA/INSPECTION:  Incision obscured by tape/bandage but no drainage or streaking apparent, pt reports frequent monitoring and no issues   LOWER EXTREMITY ROM:      Right eval Left eval  Hip flexion    Hip extension    Hip internal rotation    Hip external rotation    Knee  extension    Knee flexion    (Blank rows = not tested) (Key: WFL = within functional limits not formally assessed, * = concordant pain, s = stiffness/stretching sensation, NT = not tested)  Comments: deferred given acuity of surgery  LOWER EXTREMITY MMT:    MMT Right eval Left eval  Hip flexion    Hip abduction (modified sitting)    Hip internal rotation    Hip external rotation    Knee flexion    Knee extension    Ankle dorsiflexion     (Blank rows = not tested) (Key: WFL = within functional limits not formally assessed, * = concordant pain, s = stiffness/stretching sensation, NT = not tested)  Comments: deferred on eval given proximity to surgery   FUNCTIONAL TESTS:  TUG: 11.41sec no AD, significant  antalgic gait and BIL UE support for transfer, RLE kicked out  5xSTS: 15.48sec UE support, even BOS  GAIT: Distance walked: within clinic Assistive device utilized: None Level of assistance: Modified independence Comments: significant antalgic gait RLE, inc lateral trunk sway                                                                                                                                TREATMENT DATE:  [facility] Adult PT Treatment:                                                DATE: 09/07/23 Therapeutic Exercise: STS, quad set, glute set, ankle pumps education/practice reps HEP handout + education  Self Care: activity modification, monitoring for red flags and communication w/ provider as indicated    PATIENT EDUCATION:  Education details: Pt education on PT impairments, prognosis, and POC. Informed consent. Rationale for interventions, safe/appropriate HEP performance, post op activity modification and safety w/ mobility Person educated: Patient Education method: Explanation, Demonstration, Tactile cues, Verbal cues Education comprehension: verbalized understanding, returned demonstration, verbal cues required, tactile cues required, and needs further education    HOME EXERCISE PROGRAM: Access Code: [email or web address] URL: [email or web address] Date: 09/07/2023 Prepared by: Mayme Spearman  Exercises - Sit to Stand with Armchair  - 2-3 x daily - 1 sets - 5 reps - Supine Gluteal Sets  - 2-3 x daily - 1 sets - 10 reps - Supine Ankle Pumps  - 2-3 x daily - 1 sets - 10 reps - Seated Long Arc Quad  - 2-3 x daily - 1 sets - 10 reps  ASSESSMENT:  CLINICAL IMPRESSION: Patient is a 48 y.o. gentleman who was seen today for physical therapy evaluation and treatment for R THA DOS 08/20/23. Pt was admitted to hospital after initial discharge over concern for skin integrity and swelling, new referral received from surgical team for OP PT. Pt endorses limitations in  ADLs/mobility as expected post op, requiring AD at  times and increased time/effort for mobility. No red flags in questioning/exam today. Exam is limited today given late check in, deferring focal ROM/MMT given acuity of surgery. On functional assessment pt demonstrates alterations in gait/transfer kinematics as noted above, 5xSTS and TUG both demonstrating altered mechanics, 5xSTS indicative of fall risk. No adverse events, tolerates exam/HEP well overall with education as above. Recommend skilled PT to address aforementioned deficits with aim of improving functional tolerance and reducing pain with typical activities. Pt departs today's session in no acute distress, all voiced concerns/questions addressed appropriately from PT perspective.    OBJECTIVE IMPAIRMENTS: Abnormal gait, decreased activity tolerance, decreased balance, decreased endurance, decreased mobility, difficulty walking, decreased ROM, decreased strength, impaired perceived functional ability, improper body mechanics, postural dysfunction, and pain.   ACTIVITY LIMITATIONS: carrying, lifting, bending, standing, squatting, stairs, transfers, and locomotion level  PARTICIPATION LIMITATIONS: meal prep, cleaning, laundry, driving, and community activity  PERSONAL FACTORS: Age, Time since onset of injury/illness/exacerbation, and 3+ comorbidities: anxiety/depression, DM2, gout, HTN, OSA, BIL THA are also affecting patient's functional outcome.   REHAB POTENTIAL: Good  CLINICAL DECISION MAKING: Evolving/moderate complexity  EVALUATION COMPLEXITY: Moderate   GOALS:   SHORT TERM GOALS: Target date: 10/05/2023   Pt will demonstrate appropriate understanding and performance of initially prescribed HEP in order to facilitate improved independence with management of symptoms.  Baseline: HEP established  Goal status: INITIAL   2. Pt will report at least 25% improvement in overall pain levels over past week in order to facilitate improved  tolerance to typical daily activities.   Baseline: 0-8/10  Goal status: INITIAL    LONG TERM GOALS: Target date: 11/02/2023  Pt will score 60 or greater on LEFS in order to demonstrate improved perception of function due to symptoms (MCID 9 pts) Baseline: 48/80 Goal status: INITIAL  2.  Pt will be able to perform TUG in less than or equal to 10 sec w/o mechanics grossly WNL and LRAD in order to indicate reduced risk of falling (cutoff score for fall risk 13.5 sec in community dwelling older adults per [facility] et al, 2000)  Baseline: 11sec significant antalgic gait, no AD  Goal status: INITIAL    3.  Pt will report at least 50% decrease in overall pain levels in past week in order to facilitate improved tolerance to basic ADLs/mobility.   Baseline: 0-8/10  Goal status: INITIAL    4.  Pt will be able to perform 5xSTS in less than or equal to 12sec no UE support in order to demonstrate reduced fall risk and improved functional independence (MCID 5xSTS = 2.3 sec). Baseline: 15sec UE support Goal status: INITIAL   5. Pt will be able to navigate up to a single flight of stairs w/ reciprocal pattern and no UE support in order to facilitate improved safety w/ home navigation.  Baseline: reports inc time/effort and altered mechanics  Goal status: INITIAL    PLAN:  PT FREQUENCY: 1-2x/week  PT DURATION: 8 weeks  PLANNED INTERVENTIONS: 97164- PT Re-evaluation, 97750- Physical Performance Testing, 97110-Therapeutic exercises, 97530- Therapeutic activity, [id number]- Neuromuscular re-education, 97535- Self Care, 16109- Manual therapy, [id number]- Gait training, Patient/Family education, Balance training, Stair training, Taping, Dry Needling, Joint mobilization, Cryotherapy, and Moist heat  PLAN FOR NEXT SESSION: Review/update HEP PRN. Work on functional mobility, Equities trader, glute activation. Symptom modification strategies as indicated/appropriate.    Lovett Ruck PT,  DPT 09/07/2023 10:36 AM   For all possible CPT codes, reference the Planned Interventions line above.  Check all conditions that are expected to impact treatment: {Conditions expected to impact treatment:Musculoskeletal disorders

## 2023-09-07 ENCOUNTER — Encounter: Payer: Self-pay | Admitting: Physical Therapy

## 2023-09-07 ENCOUNTER — Ambulatory Visit: Attending: Orthopedic Surgery | Admitting: Physical Therapy

## 2023-09-07 ENCOUNTER — Other Ambulatory Visit: Payer: Self-pay

## 2023-09-07 DIAGNOSIS — R2689 Other abnormalities of gait and mobility: Secondary | ICD-10-CM | POA: Diagnosis present

## 2023-09-07 DIAGNOSIS — M6281 Muscle weakness (generalized): Secondary | ICD-10-CM | POA: Diagnosis present

## 2023-09-07 DIAGNOSIS — M25551 Pain in right hip: Secondary | ICD-10-CM | POA: Insufficient documentation

## 2023-09-10 ENCOUNTER — Encounter: Payer: Self-pay | Admitting: Physical Therapy

## 2023-09-10 ENCOUNTER — Ambulatory Visit: Admitting: Physical Therapy

## 2023-09-10 DIAGNOSIS — R2689 Other abnormalities of gait and mobility: Secondary | ICD-10-CM

## 2023-09-10 DIAGNOSIS — M6281 Muscle weakness (generalized): Secondary | ICD-10-CM

## 2023-09-10 DIAGNOSIS — M25551 Pain in right hip: Secondary | ICD-10-CM

## 2023-09-10 NOTE — Therapy (Signed)
 OUTPATIENT PHYSICAL THERAPY LOWER EXTREMITY EVALUATION   Patient Name: Frank Howe MRN: [id number] DOB:1975/10/12, 48 y.o., male Today's Date: 09/10/2023  END OF SESSION:  PT End of Session - 09/10/23 1130     Visit Number 2    Number of Visits 17    Date for PT Re-Evaluation 11/02/23    Authorization Type [employer]    Authorization Time Period --    PT Start Time 1045    PT Stop Time 1125    PT Time Calculation (min) 40 min    Activity Tolerance Patient tolerated treatment well    Behavior During Therapy WFL for tasks assessed/performed              Past Medical History:  Diagnosis Date   Anxiety    Arthritis    Complication of anesthesia    Fingers numb after surgery   Depression    Diabetes mellitus type 2 in obese 01/15/2021   Elevated blood pressure reading without diagnosis of hypertension    Fatty liver    Gout    History of kidney stones    Hypertension    Impaired fasting blood sugar    Obesity    OSA (obstructive sleep apnea)    Past Surgical History:  Procedure Laterality Date   TOTAL HIP ARTHROPLASTY Left 05/21/2023   Procedure: LEFT TOTAL HIP ARTHROPLASTY ANTERIOR APPROACH;  Surgeon: Wendolyn Hamburger, MD;  Location: [facility];  Service: Orthopedics;  Laterality: Left;   TOTAL HIP ARTHROPLASTY Right 08/20/2023   Procedure: ARTHROPLASTY, HIP, TOTAL, ANTERIOR APPROACH;  Surgeon: Wendolyn Hamburger, MD;  Location: [facility];  Service: Orthopedics;  Laterality: Right;  RIGHT TOTAL HIP ARTHROPLASTY ANTERIOR   UMBILICAL HERNIA REPAIR N/A 09/14/2017   Procedure: LAPAROSCOPIC UMBILICAL HERNIA;  Surgeon: Joyce Nixon, MD;  Location: [facility];  Service: General;  Laterality: N/A;   UMBILICAL HERNIA REPAIR N/A 02/11/2018   Procedure: LAPARASCOPIC REDO REPAIR OF RECURRENT UMBILICAL HERNIA WITH INSERTION OF MESH, LYSIS OF ADHESIONS;  Surgeon: Melvenia Stabs, MD;  Location: [facility];  Service: General;  Laterality: N/A;   XI ROBOTIC ASSISTED VENTRAL HERNIA N/A  03/01/2021   Procedure: ROBOTIC RECURRENT INCISIONAL HERNIA 9CMx9CM REPAIR WITH MESH AND REMOVAL OF INTRAPERITONEAL MESH;  Surgeon: Junie Olds, MD;  Location: [facility];  Service: General;  Laterality: N/A;   Patient Active Problem List   Diagnosis Date Noted   Acute bilateral deep vein thrombosis (DVT) of femoral veins (HCC) 08/24/2023   Moderate protein malnutrition (HCC) 08/24/2023   Hypokalemia 08/24/2023   Elevated CK 08/24/2023   Cellulitis of right lower extremity 08/24/2023   Osteoarthritis of right hip 08/15/2023   Arthritis of left hip 05/17/2023   Recurrent incisional hernia 03/01/2021   SBO (small bowel obstruction) (HCC) 01/15/2021   Essential hypertension 01/15/2021   Type 2 diabetes mellitus with obesity (HCC) 01/15/2021   Obesity, Class III, BMI 40-49.9 (morbid obesity) 01/15/2021   Umbilical hernia with obstruction but no gangrene 02/11/2018   Partial small bowel obstruction (HCC) 09/13/2017   Gout, chronic 07/19/2015   OSA (obstructive sleep apnea) 07/19/2015   Obesity 06/29/2015   Elevated uric acid in blood 06/29/2015   Impaired fasting blood sugar 06/28/2015   Knee swelling 06/28/2015   Right knee pain 06/28/2015   Elevated blood pressure reading without diagnosis of hypertension 06/28/2015   Routine general medical examination at a health care facility 08/21/2011    PCP: Allene Ivan, MD   REFERRING PROVIDER: Wendolyn Hamburger, MD  REFERRING DIAG: s/p R THA Updated referral 08/31/23 post hospitalization, see media tab  THERAPY DIAG:  Other abnormalities of gait and mobility  Muscle weakness (generalized)  Pain in right hip  Rationale for Evaluation and Treatment: Rehabilitation  ONSET DATE: 08/20/23 R THA  SUBJECTIVE:   SUBJECTIVE STATEMENT: Pt attended today's session with reports of 3/10 pain. Pt stated that they have maintained great compliance with current HEP.  Exercises are easy, occasionally feels like his stitches are busting, but  doesn't see anything wrong with them.    Eval statement: Pt arrives s/p R THA 08/20/23. Was hospitalized after surgery out of concern for DVT, extensive work up performed; per 08/25/23 discharge summary:  "Admitted for CT findings concerning for DVT. LE US  was negative for DVT. History suggests cold injury due to prolonged exposure to ice pack." Pt reports compliance w/ blood thinner overall. Arrives w/o AD. Has been using AD most of the time. Followed up with surgeon on May 8th, no concerns at that visit per pt. States he is navigating stairs at home but inc time/effort.  No fevers/chills, no new N/T (reports L thigh numbness with prior surgery). Still some swelling that fluctuates based on activity. No drainage or reported redness/streaking, states he has been monitoring frequently. No distal symptoms.   PERTINENT HISTORY: anxiety/depression, DM2, gout, HTN, OSA, BIL THA  PAIN:  Are you having pain: none at present w/ medication Location/description: R hip anteriorly,muscle spasms, stiffness Best-worst over past week: 0-8/10  - aggravating factors: initial walking and prolonged walking, transfers, stairs   - Easing factors: rest, medication     PRECAUTIONS: anterior hip per acute care notes, per pt physician cleared him from precautions at May 8th visit    WEIGHT BEARING RESTRICTIONS: Yes WBAT  FALLS:  Has patient fallen in last 6 months? No  LIVING ENVIRONMENT: Lives w/ spouse; requires stair navigation  OCCUPATION: not working currently - works in [employer], [employer], trying to get back by July   PLOF: Independent  PATIENT GOALS: get mobility and balance back to normal, swimming  NEXT MD VISIT: June or July   OBJECTIVE:  Note: Objective measures were completed at Evaluation unless otherwise noted.  DIAGNOSTIC FINDINGS:  CT angio 08/24/23: "IMPRESSION: 1. No arterial occlusion or significant stenosis is seen. Mild atherosclerosis as described above. 2. Suspected  partially occlusive hypodense thrombus in both superficial femoral arteries, on the left ending in the popliteal artery, on the right extending into the posterior tibial veins. Bilateral lower extremity DVT ultrasound Doppler study is recommended. 3. Superficial edema in the right hip area and upper thigh, with trace subcutaneous soft tissue air at the level of surgery. Findings could be normal postoperative edema or cellulitis. No localizing collection. 4. No deep space edema or fluid collections and no deep space free air identified in the right lower extremity. 5. Mild cardiomegaly. 6. Mild hepatic steatosis. 7. Constipation and diverticulosis. 8. Small umbilical and inguinal fat hernias. 9. Degenerative changes of the spine, knees, midfoot, and feet. 10. 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 ADDENDUM: Thrombus is suspected in the superficial femoral veins, not in the arteries as incorrectly stated in the body of the report and in the 2nd impression. Apologies for the error."  PATIENT SURVEYS:  LEFS: 48/80   COGNITION: Overall cognitive status: Within functional limits for tasks assessed     SENSATION: Pt reports chronic L thigh sensory issues, no new sensory issues RLE   EDEMA/INSPECTION:  Incision obscured by tape/bandage but no  drainage or streaking apparent, pt reports frequent monitoring and no issues   LOWER EXTREMITY ROM:      Right eval Left eval  Hip flexion    Hip extension    Hip internal rotation    Hip external rotation    Knee extension    Knee flexion    (Blank rows = not tested) (Key: WFL = within functional limits not formally assessed, * = concordant pain, s = stiffness/stretching sensation, NT = not tested)  Comments: deferred given acuity of surgery  LOWER EXTREMITY MMT:    MMT Right eval Left eval  Hip flexion    Hip abduction (modified sitting)    Hip internal rotation    Hip external rotation    Knee flexion    Knee extension     Ankle dorsiflexion     (Blank rows = not tested) (Key: WFL = within functional limits not formally assessed, * = concordant pain, s = stiffness/stretching sensation, NT = not tested)  Comments: deferred on eval given proximity to surgery   FUNCTIONAL TESTS:  TUG: 11.41sec no AD, significant antalgic gait and BIL UE support for transfer, RLE kicked out  5xSTS: 15.48sec UE support, even BOS  GAIT: Distance walked: within clinic Assistive device utilized: None Level of assistance: Modified independence Comments: significant antalgic gait RLE, inc lateral trunk sway                                                                                                                                TREATMENT: [facility] Adult PT Treatment:                                                DATE: 09/10/2023 Therapeutic Activity: NuStep 8' Standing Abduction 2x12, hold 2s Supine bridge  2x12, Supine SLR 2x8, hold 2s Step up/down, 4" step 2x12    [facility] Adult PT Treatment:                                                DATE: 09/07/23 Therapeutic Exercise: STS, quad set, glute set, ankle pumps education/practice reps HEP handout + education  Self Care: activity modification, monitoring for red flags and communication w/ provider as indicated    PATIENT EDUCATION:  Education details: Pt education on PT impairments, prognosis, and POC. Informed consent. Rationale for interventions, safe/appropriate HEP performance, post op activity modification and safety w/ mobility Person educated: Patient Education method: Explanation, Demonstration, Tactile cues, Verbal cues Education comprehension: verbalized understanding, returned demonstration, verbal cues required, tactile cues required, and needs further education    HOME EXERCISE PROGRAM: Access Code: [email or web address] URL: [email or web address] Date: 09/07/2023 Prepared by: Mayme Spearman  Exercises -  Sit to Stand with Armchair  - 2-3 x  daily - 1 sets - 5 reps - Supine Gluteal Sets  - 2-3 x daily - 1 sets - 10 reps - Supine Ankle Pumps  - 2-3 x daily - 1 sets - 10 reps - Seated Long Arc Quad  - 2-3 x daily - 1 sets - 10 reps  ASSESSMENT:  CLINICAL IMPRESSION: Pt attended physical therapy session for continuation of treatment regarding R THA 08/20/2023. Today's treatment focused on improvement of  global hip strength/stability, activity tolerance, and WB tolerance in stance. Pt showed  great tolerance to administered treatment with no adverse effects by the end of session. Skilled intervention was utilized via activity modification for pt tolerance with task completion, functional progression/regression promoting best outcomes inline with current rehab goals, as well as minimal verbal/tactile cuing alongside no physical assistance for safe and appropriate performance of today's activities. Continue to progress as tolerated.   Eval impression:Patient is a 48 y.o. gentleman who was seen today for physical therapy evaluation and treatment for R THA DOS 08/20/23. Pt was admitted to hospital after initial discharge over concern for skin integrity and swelling, new referral received from surgical team for OP PT. Pt endorses limitations in ADLs/mobility as expected post op, requiring AD at times and increased time/effort for mobility. No red flags in questioning/exam today. Exam is limited today given late check in, deferring focal ROM/MMT given acuity of surgery. On functional assessment pt demonstrates alterations in gait/transfer kinematics as noted above, 5xSTS and TUG both demonstrating altered mechanics, 5xSTS indicative of fall risk. No adverse events, tolerates exam/HEP well overall with education as above. Recommend skilled PT to address aforementioned deficits with aim of improving functional tolerance and reducing pain with typical activities. Pt departs today's session in no acute distress, all voiced concerns/questions addressed  appropriately from PT perspective.    OBJECTIVE IMPAIRMENTS: Abnormal gait, decreased activity tolerance, decreased balance, decreased endurance, decreased mobility, difficulty walking, decreased ROM, decreased strength, impaired perceived functional ability, improper body mechanics, postural dysfunction, and pain.   ACTIVITY LIMITATIONS: carrying, lifting, bending, standing, squatting, stairs, transfers, and locomotion level  PARTICIPATION LIMITATIONS: meal prep, cleaning, laundry, driving, and community activity  PERSONAL FACTORS: Age, Time since onset of injury/illness/exacerbation, and 3+ comorbidities: anxiety/depression, DM2, gout, HTN, OSA, BIL THA are also affecting patient's functional outcome.   REHAB POTENTIAL: Good  CLINICAL DECISION MAKING: Evolving/moderate complexity  EVALUATION COMPLEXITY: Moderate   GOALS:   SHORT TERM GOALS: Target date: 10/05/2023   Pt will demonstrate appropriate understanding and performance of initially prescribed HEP in order to facilitate improved independence with management of symptoms.  Baseline: HEP established  Goal status: INITIAL   2. Pt will report at least 25% improvement in overall pain levels over past week in order to facilitate improved tolerance to typical daily activities.   Baseline: 0-8/10  Goal status: INITIAL    LONG TERM GOALS: Target date: 11/02/2023  Pt will score 60 or greater on LEFS in order to demonstrate improved perception of function due to symptoms (MCID 9 pts) Baseline: 48/80 Goal status: INITIAL  2.  Pt will be able to perform TUG in less than or equal to 10 sec w/o mechanics grossly WNL and LRAD in order to indicate reduced risk of falling (cutoff score for fall risk 13.5 sec in community dwelling older adults per [facility] et al, 2000)  Baseline: 11sec significant antalgic gait, no AD  Goal status: INITIAL    3.  Pt will report  at least 50% decrease in overall pain levels in past week in order to  facilitate improved tolerance to basic ADLs/mobility.   Baseline: 0-8/10  Goal status: INITIAL    4.  Pt will be able to perform 5xSTS in less than or equal to 12sec no UE support in order to demonstrate reduced fall risk and improved functional independence (MCID 5xSTS = 2.3 sec). Baseline: 15sec UE support Goal status: INITIAL   5. Pt will be able to navigate up to a single flight of stairs w/ reciprocal pattern and no UE support in order to facilitate improved safety w/ home navigation.  Baseline: reports inc time/effort and altered mechanics  Goal status: INITIAL    PLAN:  PT FREQUENCY: 1-2x/week  PT DURATION: 8 weeks  PLANNED INTERVENTIONS: 97164- PT Re-evaluation, 97750- Physical Performance Testing, 97110-Therapeutic exercises, 97530- Therapeutic activity, [id number]- Neuromuscular re-education, 97535- Self Care, 91478- Manual therapy, [phone]- Gait training, Patient/Family education, Balance training, Stair training, Taping, Dry Needling, Joint mobilization, Cryotherapy, and Moist heat  PLAN FOR NEXT SESSION: Review/update HEP PRN. Work on functional mobility, Equities trader, glute activation. Symptom modification strategies as indicated/appropriate.    Albesa Huguenin, PT, DPT 09/10/2023, 11:31 AM   For all possible CPT codes, reference the Planned Interventions line above.     Check all conditions that are expected to impact treatment: {Conditions expected to impact treatment:Musculoskeletal disorders

## 2023-09-13 ENCOUNTER — Ambulatory Visit: Admitting: Physical Therapy

## 2023-09-13 ENCOUNTER — Encounter: Payer: Self-pay | Admitting: Physical Therapy

## 2023-09-13 DIAGNOSIS — R2689 Other abnormalities of gait and mobility: Secondary | ICD-10-CM

## 2023-09-13 DIAGNOSIS — M25551 Pain in right hip: Secondary | ICD-10-CM

## 2023-09-13 DIAGNOSIS — M6281 Muscle weakness (generalized): Secondary | ICD-10-CM

## 2023-09-13 NOTE — Therapy (Signed)
 OUTPATIENT PHYSICAL THERAPY TREATMENT   Patient Name: Frank Howe MRN: [id number] DOB:[birth year], 48 y.o., male Today's Date: 09/13/2023  END OF SESSION:  PT End of Session - 09/13/23 1447     Visit Number 3    Number of Visits 17    Date for PT Re-Evaluation 11/02/23    Authorization Type [employer]    Authorization Time Period auth tbd    PT Start Time 1447    PT Stop Time 1528    PT Time Calculation (min) 41 min    Activity Tolerance Patient tolerated treatment well               Past Medical History:  Diagnosis Date   Anxiety    Arthritis    Complication of anesthesia    Fingers numb after surgery   Depression    Diabetes mellitus type 2 in obese 01/15/2021   Elevated blood pressure reading without diagnosis of hypertension    Fatty liver    Gout    History of kidney stones    Hypertension    Impaired fasting blood sugar    Obesity    OSA (obstructive sleep apnea)    Past Surgical History:  Procedure Laterality Date   TOTAL HIP ARTHROPLASTY Left 05/21/2023   Procedure: LEFT TOTAL HIP ARTHROPLASTY ANTERIOR APPROACH;  Surgeon: Wendolyn Hamburger, MD;  Location: [facility];  Service: Orthopedics;  Laterality: Left;   TOTAL HIP ARTHROPLASTY Right 08/20/2023   Procedure: ARTHROPLASTY, HIP, TOTAL, ANTERIOR APPROACH;  Surgeon: Wendolyn Hamburger, MD;  Location: [facility];  Service: Orthopedics;  Laterality: Right;  RIGHT TOTAL HIP ARTHROPLASTY ANTERIOR   UMBILICAL HERNIA REPAIR N/A 09/14/2017   Procedure: LAPAROSCOPIC UMBILICAL HERNIA;  Surgeon: Joyce Nixon, MD;  Location: [facility];  Service: General;  Laterality: N/A;   UMBILICAL HERNIA REPAIR N/A 02/11/2018   Procedure: LAPARASCOPIC REDO REPAIR OF RECURRENT UMBILICAL HERNIA WITH INSERTION OF MESH, LYSIS OF ADHESIONS;  Surgeon: Melvenia Stabs, MD;  Location: [facility];  Service: General;  Laterality: N/A;   XI ROBOTIC ASSISTED VENTRAL HERNIA N/A 03/01/2021   Procedure: ROBOTIC RECURRENT INCISIONAL HERNIA 9CMx9CM REPAIR  WITH MESH AND REMOVAL OF INTRAPERITONEAL MESH;  Surgeon: Junie Olds, MD;  Location: [facility];  Service: General;  Laterality: N/A;   Patient Active Problem List   Diagnosis Date Noted   Acute bilateral deep vein thrombosis (DVT) of femoral veins (HCC) 08/24/2023   Moderate protein malnutrition (HCC) 08/24/2023   Hypokalemia 08/24/2023   Elevated CK 08/24/2023   Cellulitis of right lower extremity 08/24/2023   Osteoarthritis of right hip 08/15/2023   Arthritis of left hip 05/17/2023   Recurrent incisional hernia 03/01/2021   SBO (small bowel obstruction) (HCC) 01/15/2021   Essential hypertension 01/15/2021   Type 2 diabetes mellitus with obesity (HCC) 01/15/2021   Obesity, Class III, BMI 40-49.9 (morbid obesity) 01/15/2021   Umbilical hernia with obstruction but no gangrene 02/11/2018   Partial small bowel obstruction (HCC) 09/13/2017   Gout, chronic 07/19/2015   OSA (obstructive sleep apnea) 07/19/2015   Obesity 06/29/2015   Elevated uric acid in blood 06/29/2015   Impaired fasting blood sugar 06/28/2015   Knee swelling 06/28/2015   Right knee pain 06/28/2015   Elevated blood pressure reading without diagnosis of hypertension 06/28/2015   Routine general medical examination at a health care facility 08/21/2011    PCP: Allene Ivan, MD   REFERRING PROVIDER: Wendolyn Hamburger, MD   REFERRING DIAG: s/p R THA Updated referral 08/31/23 post hospitalization,  see media tab  THERAPY DIAG:  Other abnormalities of gait and mobility  Muscle weakness (generalized)  Pain in right hip  Rationale for Evaluation and Treatment: Rehabilitation  ONSET DATE: 08/20/23 R THA  SUBJECTIVE:   SUBJECTIVE STATEMENT: 09/13/2023 "its been going good". 3/10 pain at present. States he felt okay after last session, mild soreness. States incision is doing well.     Eval statement: Pt arrives s/p R THA 08/20/23. Was hospitalized after surgery out of concern for DVT, extensive work up  performed; per 08/25/23 discharge summary:  "Admitted for CT findings concerning for DVT. LE US  was negative for DVT. History suggests cold injury due to prolonged exposure to ice pack." Pt reports compliance w/ blood thinner overall. Arrives w/o AD. Has been using AD most of the time. Followed up with surgeon on May 8th, no concerns at that visit per pt. States he is navigating stairs at home but inc time/effort.  No fevers/chills, no new N/T (reports L thigh numbness with prior surgery). Still some swelling that fluctuates based on activity. No drainage or reported redness/streaking, states he has been monitoring frequently. No distal symptoms.   PERTINENT HISTORY: anxiety/depression, DM2, gout, HTN, OSA, BIL THA  PAIN:  Are you having pain: 3/10 R hip   Per eval -  Location/description: R hip anteriorly,muscle spasms, stiffness Best-worst over past week: 0-8/10  - aggravating factors: initial walking and prolonged walking, transfers, stairs   - Easing factors: rest, medication     PRECAUTIONS: anterior hip per acute care notes, per pt physician cleared him from precautions at May 8th visit    WEIGHT BEARING RESTRICTIONS: Yes WBAT  FALLS:  Has patient fallen in last 6 months? No  LIVING ENVIRONMENT: Lives w/ spouse; requires stair navigation  OCCUPATION: not working currently - works in [employer], [employer], trying to get back by July   PLOF: Independent  PATIENT GOALS: get mobility and balance back to normal, swimming  NEXT MD VISIT: June or July   OBJECTIVE:  Note: Objective measures were completed at Evaluation unless otherwise noted.  DIAGNOSTIC FINDINGS:  CT angio 08/24/23: "IMPRESSION: 1. No arterial occlusion or significant stenosis is seen. Mild atherosclerosis as described above. 2. Suspected partially occlusive hypodense thrombus in both superficial femoral arteries, on the left ending in the popliteal artery, on the right extending into the posterior tibial  veins. Bilateral lower extremity DVT ultrasound Doppler study is recommended. 3. Superficial edema in the right hip area and upper thigh, with trace subcutaneous soft tissue air at the level of surgery. Findings could be normal postoperative edema or cellulitis. No localizing collection. 4. No deep space edema or fluid collections and no deep space free air identified in the right lower extremity. 5. Mild cardiomegaly. 6. Mild hepatic steatosis. 7. Constipation and diverticulosis. 8. Small umbilical and inguinal fat hernias. 9. Degenerative changes of the spine, knees, midfoot, and feet. 10. 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 ADDENDUM: Thrombus is suspected in the superficial femoral veins, not in the arteries as incorrectly stated in the body of the report and in the 2nd impression. Apologies for the error."  PATIENT SURVEYS:  LEFS: 48/80   COGNITION: Overall cognitive status: Within functional limits for tasks assessed     SENSATION: Pt reports chronic L thigh sensory issues, no new sensory issues RLE   EDEMA/INSPECTION:  Incision obscured by tape/bandage but no drainage or streaking apparent, pt reports frequent monitoring and no issues   LOWER EXTREMITY ROM:  Right eval Left eval  Hip flexion    Hip extension    Hip internal rotation    Hip external rotation    Knee extension    Knee flexion    (Blank rows = not tested) (Key: WFL = within functional limits not formally assessed, * = concordant pain, s = stiffness/stretching sensation, NT = not tested)  Comments: deferred given acuity of surgery  LOWER EXTREMITY MMT:    MMT Right eval Left eval  Hip flexion    Hip abduction (modified sitting)    Hip internal rotation    Hip external rotation    Knee flexion    Knee extension    Ankle dorsiflexion     (Blank rows = not tested) (Key: WFL = within functional limits not formally assessed, * = concordant pain, s = stiffness/stretching sensation,  NT = not tested)  Comments: deferred on eval given proximity to surgery   FUNCTIONAL TESTS:  TUG: 11.41sec no AD, significant antalgic gait and BIL UE support for transfer, RLE kicked out  5xSTS: 15.48sec UE support, even BOS  GAIT: Distance walked: within clinic Assistive device utilized: None Level of assistance: Modified independence Comments: significant antalgic gait RLE, inc lateral trunk sway                                                                                                                                TREATMENT: [facility] Adult PT Treatment:                                                DATE: 09/13/23 Therapeutic Exercise: Nu step L8 LE/UE during subjective  Bosu TKE push 2x10 RLE cues for positioning  Therapeutic Activity: STS from mat x8 cues for BOS STS from chair 2x8 no UE support   4 inch step ups 3x8 RLE  cues for pacing/posture  Gait Training Styrofoam cup step overs 3 laps w/cues for posture, clearance, and heel strike Gait 2x58ft no AD cues for posture and foot strike, cleraance 4 cup stacked tap; x10 BIL LE for clearance Education on gait cycle, mechanics/posture     [facility] Adult PT Treatment:                                                DATE: 09/10/2023 Therapeutic Activity: NuStep 8' Standing Abduction 2x12, hold 2s Supine bridge  2x12, Supine SLR 2x8, hold 2s Step up/down, 4" step 2x12    [facility] Adult PT Treatment:  DATE: 09/07/23 Therapeutic Exercise: STS, quad set, glute set, ankle pumps education/practice reps HEP handout + education  Self Care: activity modification, monitoring for red flags and communication w/ provider as indicated    PATIENT EDUCATION:  Education details: rationale for interventions, HEP  Person educated: Patient Education method: Explanation, Demonstration, Tactile cues, Verbal cues Education comprehension: verbalized understanding, returned  demonstration, verbal cues required, tactile cues required, and needs further education     HOME EXERCISE PROGRAM: Access Code: [email or web address] URL: [email or web address] Date: 09/07/2023 Prepared by: Mayme Spearman  Exercises - Sit to Stand with Armchair  - 2-3 x daily - 1 sets - 5 reps - Supine Gluteal Sets  - 2-3 x daily - 1 sets - 10 reps - Supine Ankle Pumps  - 2-3 x daily - 1 sets - 10 reps - Seated Long Arc Quad  - 2-3 x daily - 1 sets - 10 reps  ASSESSMENT:  CLINICAL IMPRESSION: 09/13/2023 Pt arrives w/ baseline pain, no issues after last session. Today we progress functional strengthening w/ STS volume and step ups, focal LE strengthening. Also working on Animator with cues as above. No adverse events, tolerates session well overall, no increase in pain on departure. He does mention he would like to get into the pool soon for some activity - advised clearing with surgeon due to incision, pt verbalizes understanding. Recommend continuing along current POC in order to address relevant deficits and improve functional tolerance. Pt departs today's session in no acute distress, all voiced questions/concerns addressed appropriately from PT perspective.     Eval impression:Patient is a 48 y.o. gentleman who was seen today for physical therapy evaluation and treatment for R THA DOS 08/20/23. Pt was admitted to hospital after initial discharge over concern for skin integrity and swelling, new referral received from surgical team for OP PT. Pt endorses limitations in ADLs/mobility as expected post op, requiring AD at times and increased time/effort for mobility. No red flags in questioning/exam today. Exam is limited today given late check in, deferring focal ROM/MMT given acuity of surgery. On functional assessment pt demonstrates alterations in gait/transfer kinematics as noted above, 5xSTS and TUG both demonstrating altered mechanics, 5xSTS indicative of fall risk. No adverse events,  tolerates exam/HEP well overall with education as above. Recommend skilled PT to address aforementioned deficits with aim of improving functional tolerance and reducing pain with typical activities. Pt departs today's session in no acute distress, all voiced concerns/questions addressed appropriately from PT perspective.    OBJECTIVE IMPAIRMENTS: Abnormal gait, decreased activity tolerance, decreased balance, decreased endurance, decreased mobility, difficulty walking, decreased ROM, decreased strength, impaired perceived functional ability, improper body mechanics, postural dysfunction, and pain.   ACTIVITY LIMITATIONS: carrying, lifting, bending, standing, squatting, stairs, transfers, and locomotion level  PARTICIPATION LIMITATIONS: meal prep, cleaning, laundry, driving, and community activity  PERSONAL FACTORS: Age, Time since onset of injury/illness/exacerbation, and 3+ comorbidities: anxiety/depression, DM2, gout, HTN, OSA, BIL THA are also affecting patient's functional outcome.   REHAB POTENTIAL: Good  CLINICAL DECISION MAKING: Evolving/moderate complexity  EVALUATION COMPLEXITY: Moderate   GOALS:   SHORT TERM GOALS: Target date: 10/05/2023   Pt will demonstrate appropriate understanding and performance of initially prescribed HEP in order to facilitate improved independence with management of symptoms.  Baseline: HEP established  Goal status: INITIAL   2. Pt will report at least 25% improvement in overall pain levels over past week in order to facilitate improved tolerance to typical daily activities.   Baseline: 0-8/10  Goal status: INITIAL  LONG TERM GOALS: Target date: 11/02/2023  Pt will score 60 or greater on LEFS in order to demonstrate improved perception of function due to symptoms (MCID 9 pts) Baseline: 48/80 Goal status: INITIAL  2.  Pt will be able to perform TUG in less than or equal to 10 sec w/o mechanics grossly WNL and LRAD in order to indicate reduced  risk of falling (cutoff score for fall risk 13.5 sec in community dwelling older adults per [facility] et al, 2000)  Baseline: 11sec significant antalgic gait, no AD  Goal status: INITIAL    3.  Pt will report at least 50% decrease in overall pain levels in past week in order to facilitate improved tolerance to basic ADLs/mobility.   Baseline: 0-8/10  Goal status: INITIAL    4.  Pt will be able to perform 5xSTS in less than or equal to 12sec no UE support in order to demonstrate reduced fall risk and improved functional independence (MCID 5xSTS = 2.3 sec). Baseline: 15sec UE support Goal status: INITIAL   5. Pt will be able to navigate up to a single flight of stairs w/ reciprocal pattern and no UE support in order to facilitate improved safety w/ home navigation.  Baseline: reports inc time/effort and altered mechanics  Goal status: INITIAL    PLAN:  PT FREQUENCY: 1-2x/week  PT DURATION: 8 weeks  PLANNED INTERVENTIONS: 97164- PT Re-evaluation, 97750- Physical Performance Testing, 97110-Therapeutic exercises, 97530- Therapeutic activity, [id number]- Neuromuscular re-education, 97535- Self Care, 16109- Manual therapy, [phone]- Gait training, Patient/Family education, Balance training, Stair training, Taping, Dry Needling, Joint mobilization, Cryotherapy, and Moist heat  PLAN FOR NEXT SESSION: Review/update HEP PRN. Work on functional mobility, Equities trader, glute activation. Symptom modification strategies as indicated/appropriate.    Lovett Ruck PT, DPT 09/13/2023 3:32 PM   For all possible CPT codes, reference the Planned Interventions line above.     Check all conditions that are expected to impact treatment: {Conditions expected to impact treatment:Musculoskeletal disorders

## 2023-09-18 ENCOUNTER — Telehealth: Payer: Self-pay

## 2023-09-18 ENCOUNTER — Ambulatory Visit

## 2023-09-18 NOTE — Telephone Encounter (Signed)
 Spoke with patient. States that he didn't realize his appointment was today, d/t missing text reminder. - MJ

## 2023-09-19 NOTE — Therapy (Signed)
 OUTPATIENT PHYSICAL THERAPY TREATMENT   Patient Name: Frank Howe MRN: [id number] DOB:Sep 12, 1975, 48 y.o., male Today's Date: 09/20/2023  END OF SESSION:  PT End of Session - 09/20/23 1129     Visit Number 4    Number of Visits 17    Date for PT Re-Evaluation 11/02/23    Authorization Type [employer]    PT Start Time 1131    PT Stop Time 1213    PT Time Calculation (min) 42 min                Past Medical History:  Diagnosis Date   Anxiety    Arthritis    Complication of anesthesia    Fingers numb after surgery   Depression    Diabetes mellitus type 2 in obese 01/15/2021   Elevated blood pressure reading without diagnosis of hypertension    Fatty liver    Gout    History of kidney stones    Hypertension    Impaired fasting blood sugar    Obesity    OSA (obstructive sleep apnea)    Past Surgical History:  Procedure Laterality Date   TOTAL HIP ARTHROPLASTY Left 05/21/2023   Procedure: LEFT TOTAL HIP ARTHROPLASTY ANTERIOR APPROACH;  Surgeon: Wendolyn Hamburger, MD;  Location: [facility];  Service: Orthopedics;  Laterality: Left;   TOTAL HIP ARTHROPLASTY Right 08/20/2023   Procedure: ARTHROPLASTY, HIP, TOTAL, ANTERIOR APPROACH;  Surgeon: Wendolyn Hamburger, MD;  Location: [facility];  Service: Orthopedics;  Laterality: Right;  RIGHT TOTAL HIP ARTHROPLASTY ANTERIOR   UMBILICAL HERNIA REPAIR N/A 09/14/2017   Procedure: LAPAROSCOPIC UMBILICAL HERNIA;  Surgeon: Joyce Nixon, MD;  Location: [facility];  Service: General;  Laterality: N/A;   UMBILICAL HERNIA REPAIR N/A 02/11/2018   Procedure: LAPARASCOPIC REDO REPAIR OF RECURRENT UMBILICAL HERNIA WITH INSERTION OF MESH, LYSIS OF ADHESIONS;  Surgeon: Melvenia Stabs, MD;  Location: [facility];  Service: General;  Laterality: N/A;   XI ROBOTIC ASSISTED VENTRAL HERNIA N/A 03/01/2021   Procedure: ROBOTIC RECURRENT INCISIONAL HERNIA 9CMx9CM REPAIR WITH MESH AND REMOVAL OF INTRAPERITONEAL MESH;  Surgeon: Junie Olds, MD;  Location: [facility];  Service: General;  Laterality: N/A;   Patient Active Problem List   Diagnosis Date Noted   Acute bilateral deep vein thrombosis (DVT) of femoral veins (HCC) 08/24/2023   Moderate protein malnutrition (HCC) 08/24/2023   Hypokalemia 08/24/2023   Elevated CK 08/24/2023   Cellulitis of right lower extremity 08/24/2023   Osteoarthritis of right hip 08/15/2023   Arthritis of left hip 05/17/2023   Recurrent incisional hernia 03/01/2021   SBO (small bowel obstruction) (HCC) 01/15/2021   Essential hypertension 01/15/2021   Type 2 diabetes mellitus with obesity (HCC) 01/15/2021   Obesity, Class III, BMI 40-49.9 (morbid obesity) 01/15/2021   Umbilical hernia with obstruction but no gangrene 02/11/2018   Partial small bowel obstruction (HCC) 09/13/2017   Gout, chronic 07/19/2015   OSA (obstructive sleep apnea) 07/19/2015   Obesity 06/29/2015   Elevated uric acid in blood 06/29/2015   Impaired fasting blood sugar 06/28/2015   Knee swelling 06/28/2015   Right knee pain 06/28/2015   Elevated blood pressure reading without diagnosis of hypertension 06/28/2015   Routine general medical examination at a health care facility 08/21/2011    PCP: Allene Ivan, MD   REFERRING PROVIDER: Wendolyn Hamburger, MD   REFERRING DIAG: s/p R THA Updated referral 08/31/23 post hospitalization, see media tab  THERAPY DIAG:  Other abnormalities of gait and mobility  Muscle weakness (  generalized)  Pain in right hip  Rationale for Evaluation and Treatment: Rehabilitation  ONSET DATE: 08/20/23 R THA  SUBJECTIVE:   SUBJECTIVE STATEMENT: 09/20/2023 states hip is doing well overall, mild sore but no overt pain. "Every day I'm doing better". No issues after last session. No other new updates     Eval statement: Pt arrives s/p R THA 08/20/23. Was hospitalized after surgery out of concern for DVT, extensive work up performed; per 08/25/23 discharge summary:  "Admitted for CT findings concerning for DVT. LE  US  was negative for DVT. History suggests cold injury due to prolonged exposure to ice pack." Pt reports compliance w/ blood thinner overall. Arrives w/o AD. Has been using AD most of the time. Followed up with surgeon on May 8th, no concerns at that visit per pt. States he is navigating stairs at home but inc time/effort.  No fevers/chills, no new N/T (reports L thigh numbness with prior surgery). Still some swelling that fluctuates based on activity. No drainage or reported redness/streaking, states he has been monitoring frequently. No distal symptoms.   PERTINENT HISTORY: anxiety/depression, DM2, gout, HTN, OSA, BIL THA  PAIN:  Are you having pain: none today   Per eval -  Location/description: R hip anteriorly,muscle spasms, stiffness Best-worst over past week: 0-8/10  - aggravating factors: initial walking and prolonged walking, transfers, stairs   - Easing factors: rest, medication     PRECAUTIONS: anterior hip per acute care notes, per pt physician cleared him from precautions at May 8th visit    WEIGHT BEARING RESTRICTIONS: Yes WBAT  FALLS:  Has patient fallen in last 6 months? No  LIVING ENVIRONMENT: Lives w/ spouse; requires stair navigation  OCCUPATION: not working currently - works in [employer], [employer], trying to get back by July   PLOF: Independent  PATIENT GOALS: get mobility and balance back to normal, swimming  NEXT MD VISIT: June or July   OBJECTIVE:  Note: Objective measures were completed at Evaluation unless otherwise noted.  DIAGNOSTIC FINDINGS:  CT angio 08/24/23: "IMPRESSION: 1. No arterial occlusion or significant stenosis is seen. Mild atherosclerosis as described above. 2. Suspected partially occlusive hypodense thrombus in both superficial femoral arteries, on the left ending in the popliteal artery, on the right extending into the posterior tibial veins. Bilateral lower extremity DVT ultrasound Doppler study is recommended. 3.  Superficial edema in the right hip area and upper thigh, with trace subcutaneous soft tissue air at the level of surgery. Findings could be normal postoperative edema or cellulitis. No localizing collection. 4. No deep space edema or fluid collections and no deep space free air identified in the right lower extremity. 5. Mild cardiomegaly. 6. Mild hepatic steatosis. 7. Constipation and diverticulosis. 8. Small umbilical and inguinal fat hernias. 9. Degenerative changes of the spine, knees, midfoot, and feet. 10. 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 ADDENDUM: Thrombus is suspected in the superficial femoral veins, not in the arteries as incorrectly stated in the body of the report and in the 2nd impression. Apologies for the error."  PATIENT SURVEYS:  LEFS: 48/80   COGNITION: Overall cognitive status: Within functional limits for tasks assessed     SENSATION: Pt reports chronic L thigh sensory issues, no new sensory issues RLE   EDEMA/INSPECTION:  Incision obscured by tape/bandage but no drainage or streaking apparent, pt reports frequent monitoring and no issues   LOWER EXTREMITY ROM:      Right eval Left eval  Hip flexion    Hip extension  Hip internal rotation    Hip external rotation    Knee extension    Knee flexion    (Blank rows = not tested) (Key: WFL = within functional limits not formally assessed, * = concordant pain, s = stiffness/stretching sensation, NT = not tested)  Comments: deferred given acuity of surgery  LOWER EXTREMITY MMT:    MMT Right eval Left eval  Hip flexion    Hip abduction (modified sitting)    Hip internal rotation    Hip external rotation    Knee flexion    Knee extension    Ankle dorsiflexion     (Blank rows = not tested) (Key: WFL = within functional limits not formally assessed, * = concordant pain, s = stiffness/stretching sensation, NT = not tested)  Comments: deferred on eval given proximity to  surgery   FUNCTIONAL TESTS:  TUG: 11.41sec no AD, significant antalgic gait and BIL UE support for transfer, RLE kicked out  5xSTS: 15.48sec UE support, even BOS  GAIT: Distance walked: within clinic Assistive device utilized: None Level of assistance: Modified independence Comments: significant antalgic gait RLE, inc lateral trunk sway                                                                                                                                TREATMENT: [facility] Adult PT Treatment:                                                DATE: 09/20/23 Therapeutic Exercise: Nu step L4-L9 LE/UE 6 min during subjective Bosu TKE push 3x10 RLE HEP update + education/handout  Therapeutic Activity: STS x10 BW from chair cues for trunk posture Staggered STS RLE bias x8 from raised mat 4 inch step ups 2x12 BIL no UE support 2inch lateral step down 2x5 RLE only cues for alignment Standing heel raise x10    [facility] Adult PT Treatment:                                                DATE: 09/13/23 Therapeutic Exercise: Nu step L8 LE/UE during subjective  Bosu TKE push 2x10 RLE cues for positioning  Therapeutic Activity: STS from mat x8 cues for BOS STS from chair 2x8 no UE support   4 inch step ups 3x8 RLE  cues for pacing/posture  Gait Training Styrofoam cup step overs 3 laps w/cues for posture, clearance, and heel strike Gait 2x32ft no AD cues for posture and foot strike, cleraance 4 cup stacked tap; x10 BIL LE for clearance Education on gait cycle, mechanics/posture     [facility] Adult PT Treatment:  DATE: 09/10/2023 Therapeutic Activity: NuStep 8' Standing Abduction 2x12, hold 2s Supine bridge  2x12, Supine SLR 2x8, hold 2s Step up/down, 4" step 2x12   PATIENT EDUCATION:  Education details: rationale for interventions, HEP  Person educated: Patient Education method: Explanation, Demonstration, Tactile cues, Verbal  cues Education comprehension: verbalized understanding, returned demonstration, verbal cues required, tactile cues required, and needs further education     HOME EXERCISE PROGRAM: Access Code: [email or web address] URL: [email or web address] Date: 09/20/2023 Prepared by: Mayme Spearman  Exercises - Sit to Stand with Armchair  - 2-3 x daily - 1 sets - 5 reps - Seated Long Arc Quad  - 2-3 x daily - 1 sets - 10 reps - Standing March with Counter Support  - 2-3 x daily - 1 sets - 8 reps - Heel Raises with Counter Support  - 2-3 x daily - 1 sets - 8 reps  ASSESSMENT:  CLINICAL IMPRESSION: 09/20/2023 - Pt arrives w/ report of continued improvement. Continuing to progress volume with familiar exercises and increased time w/ functional strengthening. Tendency for frontal plane compensations that improve w/ cues/repetition. No adverse events, tolerates session well overall. No increase in pain on departure. Recommend continuing along current POC in order to address relevant deficits and improve functional tolerance. Pt departs today's session in no acute distress, all voiced questions/concerns addressed appropriately from PT perspective.      Eval impression:Patient is a 48 y.o. gentleman who was seen today for physical therapy evaluation and treatment for R THA DOS 08/20/23. Pt was admitted to hospital after initial discharge over concern for skin integrity and swelling, new referral received from surgical team for OP PT. Pt endorses limitations in ADLs/mobility as expected post op, requiring AD at times and increased time/effort for mobility. No red flags in questioning/exam today. Exam is limited today given late check in, deferring focal ROM/MMT given acuity of surgery. On functional assessment pt demonstrates alterations in gait/transfer kinematics as noted above, 5xSTS and TUG both demonstrating altered mechanics, 5xSTS indicative of fall risk. No adverse events, tolerates exam/HEP well overall with  education as above. Recommend skilled PT to address aforementioned deficits with aim of improving functional tolerance and reducing pain with typical activities. Pt departs today's session in no acute distress, all voiced concerns/questions addressed appropriately from PT perspective.    OBJECTIVE IMPAIRMENTS: Abnormal gait, decreased activity tolerance, decreased balance, decreased endurance, decreased mobility, difficulty walking, decreased ROM, decreased strength, impaired perceived functional ability, improper body mechanics, postural dysfunction, and pain.   ACTIVITY LIMITATIONS: carrying, lifting, bending, standing, squatting, stairs, transfers, and locomotion level  PARTICIPATION LIMITATIONS: meal prep, cleaning, laundry, driving, and community activity  PERSONAL FACTORS: Age, Time since onset of injury/illness/exacerbation, and 3+ comorbidities: anxiety/depression, DM2, gout, HTN, OSA, BIL THA are also affecting patient's functional outcome.   REHAB POTENTIAL: Good  CLINICAL DECISION MAKING: Evolving/moderate complexity  EVALUATION COMPLEXITY: Moderate   GOALS:   SHORT TERM GOALS: Target date: 10/05/2023   Pt will demonstrate appropriate understanding and performance of initially prescribed HEP in order to facilitate improved independence with management of symptoms.  Baseline: HEP established  Goal status: INITIAL   2. Pt will report at least 25% improvement in overall pain levels over past week in order to facilitate improved tolerance to typical daily activities.   Baseline: 0-8/10  Goal status: INITIAL    LONG TERM GOALS: Target date: 11/02/2023  Pt will score 60 or greater on LEFS in order to demonstrate improved perception of function due to symptoms (MCID 9 pts)  Baseline: 48/80 Goal status: INITIAL  2.  Pt will be able to perform TUG in less than or equal to 10 sec w/o mechanics grossly WNL and LRAD in order to indicate reduced risk of falling (cutoff score for fall  risk 13.5 sec in community dwelling older adults per [facility] et al, 2000)  Baseline: 11sec significant antalgic gait, no AD  Goal status: INITIAL    3.  Pt will report at least 50% decrease in overall pain levels in past week in order to facilitate improved tolerance to basic ADLs/mobility.   Baseline: 0-8/10  Goal status: INITIAL    4.  Pt will be able to perform 5xSTS in less than or equal to 12sec no UE support in order to demonstrate reduced fall risk and improved functional independence (MCID 5xSTS = 2.3 sec). Baseline: 15sec UE support Goal status: INITIAL   5. Pt will be able to navigate up to a single flight of stairs w/ reciprocal pattern and no UE support in order to facilitate improved safety w/ home navigation.  Baseline: reports inc time/effort and altered mechanics  Goal status: INITIAL    PLAN:  PT FREQUENCY: 1-2x/week  PT DURATION: 8 weeks  PLANNED INTERVENTIONS: 97164- PT Re-evaluation, 97750- Physical Performance Testing, 97110-Therapeutic exercises, 97530- Therapeutic activity, [id number]- Neuromuscular re-education, 97535- Self Care, 19147- Manual therapy, [phone]- Gait training, Patient/Family education, Balance training, Stair training, Taping, Dry Needling, Joint mobilization, Cryotherapy, and Moist heat  PLAN FOR NEXT SESSION: Review/update HEP PRN. Work on functional mobility, Equities trader, glute activation. Symptom modification strategies as indicated/appropriate.    Lovett Ruck PT, DPT 09/20/2023 12:47 PM   For all possible CPT codes, reference the Planned Interventions line above.     Check all conditions that are expected to impact treatment: {Conditions expected to impact treatment:Musculoskeletal disorders

## 2023-09-20 ENCOUNTER — Encounter: Payer: Self-pay | Admitting: Physical Therapy

## 2023-09-20 ENCOUNTER — Ambulatory Visit: Admitting: Physical Therapy

## 2023-09-20 DIAGNOSIS — M6281 Muscle weakness (generalized): Secondary | ICD-10-CM

## 2023-09-20 DIAGNOSIS — M25551 Pain in right hip: Secondary | ICD-10-CM

## 2023-09-20 DIAGNOSIS — R2689 Other abnormalities of gait and mobility: Secondary | ICD-10-CM

## 2023-09-24 NOTE — Therapy (Signed)
 OUTPATIENT PHYSICAL THERAPY TREATMENT   Patient Name: Frank Howe MRN: [id number] DOB:1975/12/02, 48 y.o., male Today's Date: 09/25/2023  END OF SESSION:  PT End of Session - 09/25/23 1400     Visit Number 5    Number of Visits 17    Date for PT Re-Evaluation 11/02/23    Authorization Type [employer]    Authorization Time Period 13 visits 09/20/23-12/19/23    Authorization - Visit Number 2    Authorization - Number of Visits 13    PT Start Time 1401    PT Stop Time 1443    PT Time Calculation (min) 42 min                 Past Medical History:  Diagnosis Date   Anxiety    Arthritis    Complication of anesthesia    Fingers numb after surgery   Depression    Diabetes mellitus type 2 in obese 01/15/2021   Elevated blood pressure reading without diagnosis of hypertension    Fatty liver    Gout    History of kidney stones    Hypertension    Impaired fasting blood sugar    Obesity    OSA (obstructive sleep apnea)    Past Surgical History:  Procedure Laterality Date   TOTAL HIP ARTHROPLASTY Left 05/21/2023   Procedure: LEFT TOTAL HIP ARTHROPLASTY ANTERIOR APPROACH;  Surgeon: Wendolyn Hamburger, MD;  Location: [facility];  Service: Orthopedics;  Laterality: Left;   TOTAL HIP ARTHROPLASTY Right 08/20/2023   Procedure: ARTHROPLASTY, HIP, TOTAL, ANTERIOR APPROACH;  Surgeon: Wendolyn Hamburger, MD;  Location: [facility];  Service: Orthopedics;  Laterality: Right;  RIGHT TOTAL HIP ARTHROPLASTY ANTERIOR   UMBILICAL HERNIA REPAIR N/A 09/14/2017   Procedure: LAPAROSCOPIC UMBILICAL HERNIA;  Surgeon: Joyce Nixon, MD;  Location: [facility];  Service: General;  Laterality: N/A;   UMBILICAL HERNIA REPAIR N/A 02/11/2018   Procedure: LAPARASCOPIC REDO REPAIR OF RECURRENT UMBILICAL HERNIA WITH INSERTION OF MESH, LYSIS OF ADHESIONS;  Surgeon: Melvenia Stabs, MD;  Location: [facility];  Service: General;  Laterality: N/A;   XI ROBOTIC ASSISTED VENTRAL HERNIA N/A 03/01/2021   Procedure: ROBOTIC  RECURRENT INCISIONAL HERNIA 9CMx9CM REPAIR WITH MESH AND REMOVAL OF INTRAPERITONEAL MESH;  Surgeon: Junie Olds, MD;  Location: [facility];  Service: General;  Laterality: N/A;   Patient Active Problem List   Diagnosis Date Noted   Acute bilateral deep vein thrombosis (DVT) of femoral veins (HCC) 08/24/2023   Moderate protein malnutrition (HCC) 08/24/2023   Hypokalemia 08/24/2023   Elevated CK 08/24/2023   Cellulitis of right lower extremity 08/24/2023   Osteoarthritis of right hip 08/15/2023   Arthritis of left hip 05/17/2023   Recurrent incisional hernia 03/01/2021   SBO (small bowel obstruction) (HCC) 01/15/2021   Essential hypertension 01/15/2021   Type 2 diabetes mellitus with obesity (HCC) 01/15/2021   Obesity, Class III, BMI 40-49.9 (morbid obesity) 01/15/2021   Umbilical hernia with obstruction but no gangrene 02/11/2018   Partial small bowel obstruction (HCC) 09/13/2017   Gout, chronic 07/19/2015   OSA (obstructive sleep apnea) 07/19/2015   Obesity 06/29/2015   Elevated uric acid in blood 06/29/2015   Impaired fasting blood sugar 06/28/2015   Knee swelling 06/28/2015   Right knee pain 06/28/2015   Elevated blood pressure reading without diagnosis of hypertension 06/28/2015   Routine general medical examination at a health care facility 08/21/2011    PCP: Allene Ivan, MD   REFERRING PROVIDER: Wendolyn Hamburger, MD  REFERRING DIAG: s/p R THA Updated referral 08/31/23 post hospitalization, see media tab  THERAPY DIAG:  Other abnormalities of gait and mobility  Muscle weakness (generalized)  Pain in right hip  Rationale for Evaluation and Treatment: Rehabilitation  ONSET DATE: 08/20/23 R THA  SUBJECTIVE:   SUBJECTIVE STATEMENT: 09/25/2023 Continues to endorse steady improvement, no issues after last session. No other new updates.    Eval statement: Pt arrives s/p R THA 08/20/23. Was hospitalized after surgery out of concern for DVT, extensive work up  performed; per 08/25/23 discharge summary:  "Admitted for CT findings concerning for DVT. LE US  was negative for DVT. History suggests cold injury due to prolonged exposure to ice pack." Pt reports compliance w/ blood thinner overall. Arrives w/o AD. Has been using AD most of the time. Followed up with surgeon on May 8th, no concerns at that visit per pt. States he is navigating stairs at home but inc time/effort.  No fevers/chills, no new N/T (reports L thigh numbness with prior surgery). Still some swelling that fluctuates based on activity. No drainage or reported redness/streaking, states he has been monitoring frequently. No distal symptoms.   PERTINENT HISTORY: anxiety/depression, DM2, gout, HTN, OSA, BIL THA  PAIN:  Are you having pain: none today   Per eval -  Location/description: R hip anteriorly,muscle spasms, stiffness Best-worst over past week: 0-8/10  - aggravating factors: initial walking and prolonged walking, transfers, stairs   - Easing factors: rest, medication     PRECAUTIONS: anterior hip per acute care notes, per pt physician cleared him from precautions at May 8th visit    WEIGHT BEARING RESTRICTIONS: Yes WBAT  FALLS:  Has patient fallen in last 6 months? No  LIVING ENVIRONMENT: Lives w/ spouse; requires stair navigation  OCCUPATION: not working currently - works in [employer], [employer], trying to get back by July   PLOF: Independent  PATIENT GOALS: get mobility and balance back to normal, swimming  NEXT MD VISIT: June or July   OBJECTIVE:  Note: Objective measures were completed at Evaluation unless otherwise noted.  DIAGNOSTIC FINDINGS:  CT angio 08/24/23: "IMPRESSION: 1. No arterial occlusion or significant stenosis is seen. Mild atherosclerosis as described above. 2. Suspected partially occlusive hypodense thrombus in both superficial femoral arteries, on the left ending in the popliteal artery, on the right extending into the posterior tibial  veins. Bilateral lower extremity DVT ultrasound Doppler study is recommended. 3. Superficial edema in the right hip area and upper thigh, with trace subcutaneous soft tissue air at the level of surgery. Findings could be normal postoperative edema or cellulitis. No localizing collection. 4. No deep space edema or fluid collections and no deep space free air identified in the right lower extremity. 5. Mild cardiomegaly. 6. Mild hepatic steatosis. 7. Constipation and diverticulosis. 8. Small umbilical and inguinal fat hernias. 9. Degenerative changes of the spine, knees, midfoot, and feet. 10. 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 ADDENDUM: Thrombus is suspected in the superficial femoral veins, not in the arteries as incorrectly stated in the body of the report and in the 2nd impression. Apologies for the error."  PATIENT SURVEYS:  LEFS: 48/80   COGNITION: Overall cognitive status: Within functional limits for tasks assessed     SENSATION: Pt reports chronic L thigh sensory issues, no new sensory issues RLE   EDEMA/INSPECTION:  Incision obscured by tape/bandage but no drainage or streaking apparent, pt reports frequent monitoring and no issues   LOWER EXTREMITY ROM:  Right eval Left eval  Hip flexion    Hip extension    Hip internal rotation    Hip external rotation    Knee extension    Knee flexion    (Blank rows = not tested) (Key: WFL = within functional limits not formally assessed, * = concordant pain, s = stiffness/stretching sensation, NT = not tested)  Comments: deferred given acuity of surgery  LOWER EXTREMITY MMT:    MMT Right eval Left eval  Hip flexion    Hip abduction (modified sitting)    Hip internal rotation    Hip external rotation    Knee flexion    Knee extension    Ankle dorsiflexion     (Blank rows = not tested) (Key: WFL = within functional limits not formally assessed, * = concordant pain, s = stiffness/stretching sensation,  NT = not tested)  Comments: deferred on eval given proximity to surgery   FUNCTIONAL TESTS:  TUG: 11.41sec no AD, significant antalgic gait and BIL UE support for transfer, RLE kicked out  5xSTS: 15.48sec UE support, even BOS   09/25/23: 5xSTS 14.37sec no UE support, no pain, even BOS  TUG 8.00sec no AD, mild gait asymmetry but improved compared to initial eval SLS 2 sec LLE, 1 sec before LOB requiring UE support  GAIT: Distance walked: within clinic Assistive device utilized: None Level of assistance: Modified independence Comments: significant antalgic gait RLE, inc lateral trunk sway                                                                                                                                TREATMENT: [facility] Adult PT Treatment:                                                DATE: 09/25/23  Neuromuscular re-ed: SLS assessment + education SLS BIL LE w/ 2 finger support on counter 2x30sec cues for posture Standing hamstring curl off 2inch step, 2 finger support on counter 2x10 BIL  Therapeutic Activity: Nu step L9 LE/UE 10 min during subjective  5xSTS + education TUG + education 4 inch fwd step up 2x12 BIL emphasis on pacing/posture    [facility] Adult PT Treatment:                                                DATE: 09/20/23 Therapeutic Exercise: Nu step L4-L9 LE/UE 6 min during subjective Bosu TKE push 3x10 RLE HEP update + education/handout  Therapeutic Activity: STS x10 BW from chair cues for trunk posture Staggered STS RLE bias x8 from raised mat 4 inch step ups 2x12 BIL no UE support 2inch lateral step down 2x5 RLE  only cues for alignment Standing heel raise x10    [facility] Adult PT Treatment:                                                DATE: 09/13/23 Therapeutic Exercise: Nu step L8 LE/UE during subjective  Bosu TKE push 2x10 RLE cues for positioning  Therapeutic Activity: STS from mat x8 cues for BOS STS from chair 2x8 no UE support   4  inch step ups 3x8 RLE  cues for pacing/posture  Gait Training Styrofoam cup step overs 3 laps w/cues for posture, clearance, and heel strike Gait 2x24ft no AD cues for posture and foot strike, cleraance 4 cup stacked tap; x10 BIL LE for clearance Education on gait cycle, mechanics/posture     PATIENT EDUCATION:  Education details: rationale for interventions, HEP  Person educated: Patient Education method: Programmer, multimedia, Demonstration, Actor cues, Verbal cues Education comprehension: verbalized understanding, returned demonstration, verbal cues required, tactile cues required, and needs further education     HOME EXERCISE PROGRAM: Access Code: [email or web address] URL: [email or web address] Date: 09/20/2023 Prepared by: Mayme Spearman  Exercises - Sit to Stand with Armchair  - 2-3 x daily - 1 sets - 5 reps - Seated Long Arc Quad  - 2-3 x daily - 1 sets - 10 reps - Standing March with Counter Support  - 2-3 x daily - 1 sets - 8 reps - Heel Raises with Counter Support  - 2-3 x daily - 1 sets - 8 reps  ASSESSMENT:  CLINICAL IMPRESSION: 09/25/2023 - Pt arrives w/o pain, continued report of improvement. Noted progress w/ 5xSTS and TUG compared to initial eval, particularly with improved ease of transfers. Demonstrates impaired postural stability w/ SLS BIL. Continuing to progress functional strengthening with emphasis on appropriate mechanics and control. No adverse events, reports no pain on departure. Recommend continuing along current POC in order to address relevant deficits and improve functional tolerance. Pt departs today's session in no acute distress, all voiced questions/concerns addressed appropriately from PT perspective.     Eval impression:Patient is a 48 y.o. gentleman who was seen today for physical therapy evaluation and treatment for R THA DOS 08/20/23. Pt was admitted to hospital after initial discharge over concern for skin integrity and swelling, new referral received  from surgical team for OP PT. Pt endorses limitations in ADLs/mobility as expected post op, requiring AD at times and increased time/effort for mobility. No red flags in questioning/exam today. Exam is limited today given late check in, deferring focal ROM/MMT given acuity of surgery. On functional assessment pt demonstrates alterations in gait/transfer kinematics as noted above, 5xSTS and TUG both demonstrating altered mechanics, 5xSTS indicative of fall risk. No adverse events, tolerates exam/HEP well overall with education as above. Recommend skilled PT to address aforementioned deficits with aim of improving functional tolerance and reducing pain with typical activities. Pt departs today's session in no acute distress, all voiced concerns/questions addressed appropriately from PT perspective.    OBJECTIVE IMPAIRMENTS: Abnormal gait, decreased activity tolerance, decreased balance, decreased endurance, decreased mobility, difficulty walking, decreased ROM, decreased strength, impaired perceived functional ability, improper body mechanics, postural dysfunction, and pain.   ACTIVITY LIMITATIONS: carrying, lifting, bending, standing, squatting, stairs, transfers, and locomotion level  PARTICIPATION LIMITATIONS: meal prep, cleaning, laundry, driving, and community activity  PERSONAL FACTORS: Age, Time since onset of injury/illness/exacerbation, and 3+ comorbidities:  anxiety/depression, DM2, gout, HTN, OSA, BIL THA are also affecting patient's functional outcome.   REHAB POTENTIAL: Good  CLINICAL DECISION MAKING: Evolving/moderate complexity  EVALUATION COMPLEXITY: Moderate   GOALS:   SHORT TERM GOALS: Target date: 10/05/2023   Pt will demonstrate appropriate understanding and performance of initially prescribed HEP in order to facilitate improved independence with management of symptoms.  Baseline: HEP established  Goal status: INITIAL   2. Pt will report at least 25% improvement in overall  pain levels over past week in order to facilitate improved tolerance to typical daily activities.   Baseline: 0-8/10  Goal status: INITIAL    LONG TERM GOALS: Target date: 11/02/2023  Pt will score 60 or greater on LEFS in order to demonstrate improved perception of function due to symptoms (MCID 9 pts) Baseline: 48/80 Goal status: INITIAL  2.  Pt will be able to perform TUG in less than or equal to 10 sec w/o mechanics grossly WNL and LRAD in order to indicate reduced risk of falling (cutoff score for fall risk 13.5 sec in community dwelling older adults per [facility] et al, 2000)  Baseline: 11sec significant antalgic gait, no AD  Goal status: INITIAL    3.  Pt will report at least 50% decrease in overall pain levels in past week in order to facilitate improved tolerance to basic ADLs/mobility.   Baseline: 0-8/10  Goal status: INITIAL    4.  Pt will be able to perform 5xSTS in less than or equal to 12sec no UE support in order to demonstrate reduced fall risk and improved functional independence (MCID 5xSTS = 2.3 sec). Baseline: 15sec UE support Goal status: INITIAL   5. Pt will be able to navigate up to a single flight of stairs w/ reciprocal pattern and no UE support in order to facilitate improved safety w/ home navigation.  Baseline: reports inc time/effort and altered mechanics  Goal status: INITIAL    PLAN:  PT FREQUENCY: 1-2x/week  PT DURATION: 8 weeks  PLANNED INTERVENTIONS: 97164- PT Re-evaluation, 97750- Physical Performance Testing, 97110-Therapeutic exercises, 97530- Therapeutic activity, [id number]- Neuromuscular re-education, 97535- Self Care, 19147- Manual therapy, [phone]- Gait training, Patient/Family education, Balance training, Stair training, Taping, Dry Needling, Joint mobilization, Cryotherapy, and Moist heat  PLAN FOR NEXT SESSION: Review/update HEP PRN. Work on functional mobility, Equities trader, glute activation. Symptom modification strategies as  indicated/appropriate.    Lovett Ruck PT, DPT 09/25/2023 2:44 PM   For all possible CPT codes, reference the Planned Interventions line above.     Check all conditions that are expected to impact treatment: {Conditions expected to impact treatment:Musculoskeletal disorders

## 2023-09-25 ENCOUNTER — Encounter: Payer: Self-pay | Admitting: Physical Therapy

## 2023-09-25 ENCOUNTER — Ambulatory Visit: Attending: Orthopedic Surgery | Admitting: Physical Therapy

## 2023-09-25 DIAGNOSIS — R2689 Other abnormalities of gait and mobility: Secondary | ICD-10-CM | POA: Insufficient documentation

## 2023-09-25 DIAGNOSIS — M6281 Muscle weakness (generalized): Secondary | ICD-10-CM | POA: Insufficient documentation

## 2023-09-25 DIAGNOSIS — M25552 Pain in left hip: Secondary | ICD-10-CM | POA: Insufficient documentation

## 2023-09-25 DIAGNOSIS — M25551 Pain in right hip: Secondary | ICD-10-CM | POA: Diagnosis present

## 2023-09-26 NOTE — Therapy (Unsigned)
 OUTPATIENT PHYSICAL THERAPY TREATMENT   Patient Name: Frank Howe MRN: [id number] DOB:[birth year], 48 y.o., male Today's Date: 09/27/2023  END OF SESSION:  PT End of Session - 09/27/23 1355     Visit Number 6    Number of Visits 17    Date for PT Re-Evaluation 11/02/23    Authorization Type [employer]    Authorization Time Period 13 visits 09/20/23-12/19/23    Authorization - Visit Number 6    Authorization - Number of Visits 13    PT Start Time 1400    PT Stop Time 1440    PT Time Calculation (min) 40 min    Activity Tolerance Patient tolerated treatment well    Behavior During Therapy WFL for tasks assessed/performed                  Past Medical History:  Diagnosis Date   Anxiety    Arthritis    Complication of anesthesia    Fingers numb after surgery   Depression    Diabetes mellitus type 2 in obese 01/15/2021   Elevated blood pressure reading without diagnosis of hypertension    Fatty liver    Gout    History of kidney stones    Hypertension    Impaired fasting blood sugar    Obesity    OSA (obstructive sleep apnea)    Past Surgical History:  Procedure Laterality Date   TOTAL HIP ARTHROPLASTY Left 05/21/2023   Procedure: LEFT TOTAL HIP ARTHROPLASTY ANTERIOR APPROACH;  Surgeon: Wendolyn Hamburger, MD;  Location: [facility];  Service: Orthopedics;  Laterality: Left;   TOTAL HIP ARTHROPLASTY Right 08/20/2023   Procedure: ARTHROPLASTY, HIP, TOTAL, ANTERIOR APPROACH;  Surgeon: Wendolyn Hamburger, MD;  Location: [facility];  Service: Orthopedics;  Laterality: Right;  RIGHT TOTAL HIP ARTHROPLASTY ANTERIOR   UMBILICAL HERNIA REPAIR N/A 09/14/2017   Procedure: LAPAROSCOPIC UMBILICAL HERNIA;  Surgeon: Joyce Nixon, MD;  Location: [facility];  Service: General;  Laterality: N/A;   UMBILICAL HERNIA REPAIR N/A 02/11/2018   Procedure: LAPARASCOPIC REDO REPAIR OF RECURRENT UMBILICAL HERNIA WITH INSERTION OF MESH, LYSIS OF ADHESIONS;  Surgeon: Melvenia Stabs, MD;  Location: [facility];  Service: General;  Laterality: N/A;   XI ROBOTIC ASSISTED VENTRAL HERNIA N/A 03/01/2021   Procedure: ROBOTIC RECURRENT INCISIONAL HERNIA 9CMx9CM REPAIR WITH MESH AND REMOVAL OF INTRAPERITONEAL MESH;  Surgeon: Junie Olds, MD;  Location: [facility];  Service: General;  Laterality: N/A;   Patient Active Problem List   Diagnosis Date Noted   Acute bilateral deep vein thrombosis (DVT) of femoral veins (HCC) 08/24/2023   Moderate protein malnutrition (HCC) 08/24/2023   Hypokalemia 08/24/2023   Elevated CK 08/24/2023   Cellulitis of right lower extremity 08/24/2023   Osteoarthritis of right hip 08/15/2023   Arthritis of left hip 05/17/2023   Recurrent incisional hernia 03/01/2021   SBO (small bowel obstruction) (HCC) 01/15/2021   Essential hypertension 01/15/2021   Type 2 diabetes mellitus with obesity (HCC) 01/15/2021   Obesity, Class III, BMI 40-49.9 (morbid obesity) 01/15/2021   Umbilical hernia with obstruction but no gangrene 02/11/2018   Partial small bowel obstruction (HCC) 09/13/2017   Gout, chronic 07/19/2015   OSA (obstructive sleep apnea) 07/19/2015   Obesity 06/29/2015   Elevated uric acid in blood 06/29/2015   Impaired fasting blood sugar 06/28/2015   Knee swelling 06/28/2015   Right knee pain 06/28/2015   Elevated blood pressure reading without diagnosis of hypertension 06/28/2015   Routine general medical examination at a  health care facility 08/21/2011    PCP: Allene Ivan, MD   REFERRING PROVIDER: Wendolyn Hamburger, MD   REFERRING DIAG: s/p R THA Updated referral 08/31/23 post hospitalization, see media tab  THERAPY DIAG:  Other abnormalities of gait and mobility  Muscle weakness (generalized)  Pain in right hip  Pain in left hip  Rationale for Evaluation and Treatment: Rehabilitation  ONSET DATE: 08/20/23 R THA  SUBJECTIVE:   SUBJECTIVE STATEMENT: 09/27/2023 Continues to endorse steady improvement, no issues after last session. No other new  updates.    Eval statement: Pt arrives s/p R THA 08/20/23. Was hospitalized after surgery out of concern for DVT, extensive work up performed; per 08/25/23 discharge summary:  "Admitted for CT findings concerning for DVT. LE US  was negative for DVT. History suggests cold injury due to prolonged exposure to ice pack." Pt reports compliance w/ blood thinner overall. Arrives w/o AD. Has been using AD most of the time. Followed up with surgeon on May 8th, no concerns at that visit per pt. States he is navigating stairs at home but inc time/effort.  No fevers/chills, no new N/T (reports L thigh numbness with prior surgery). Still some swelling that fluctuates based on activity. No drainage or reported redness/streaking, states he has been monitoring frequently. No distal symptoms.   PERTINENT HISTORY: anxiety/depression, DM2, gout, HTN, OSA, BIL THA  PAIN:  Are you having pain: none today   Per eval -  Location/description: R hip anteriorly,muscle spasms, stiffness Best-worst over past week: 0-8/10  - aggravating factors: initial walking and prolonged walking, transfers, stairs   - Easing factors: rest, medication     PRECAUTIONS: anterior hip per acute care notes, per pt physician cleared him from precautions at May 8th visit    WEIGHT BEARING RESTRICTIONS: Yes WBAT  FALLS:  Has patient fallen in last 6 months? No  LIVING ENVIRONMENT: Lives w/ spouse; requires stair navigation  OCCUPATION: not working currently - works in [employer], [employer], trying to get back by July   PLOF: Independent  PATIENT GOALS: get mobility and balance back to normal, swimming  NEXT MD VISIT: June or July   OBJECTIVE:  Note: Objective measures were completed at Evaluation unless otherwise noted.  DIAGNOSTIC FINDINGS:  CT angio 08/24/23: "IMPRESSION: 1. No arterial occlusion or significant stenosis is seen. Mild atherosclerosis as described above. 2. Suspected partially occlusive hypodense thrombus  in both superficial femoral arteries, on the left ending in the popliteal artery, on the right extending into the posterior tibial veins. Bilateral lower extremity DVT ultrasound Doppler study is recommended. 3. Superficial edema in the right hip area and upper thigh, with trace subcutaneous soft tissue air at the level of surgery. Findings could be normal postoperative edema or cellulitis. No localizing collection. 4. No deep space edema or fluid collections and no deep space free air identified in the right lower extremity. 5. Mild cardiomegaly. 6. Mild hepatic steatosis. 7. Constipation and diverticulosis. 8. Small umbilical and inguinal fat hernias. 9. Degenerative changes of the spine, knees, midfoot, and feet. 10. 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 ADDENDUM: Thrombus is suspected in the superficial femoral veins, not in the arteries as incorrectly stated in the body of the report and in the 2nd impression. Apologies for the error."  PATIENT SURVEYS:  LEFS: 48/80   COGNITION: Overall cognitive status: Within functional limits for tasks assessed     SENSATION: Pt reports chronic L thigh sensory issues, no new sensory issues RLE   EDEMA/INSPECTION:  Incision obscured by tape/bandage but no drainage or streaking apparent, pt reports frequent monitoring and no issues   LOWER EXTREMITY ROM:      Right eval Left eval  Hip flexion    Hip extension    Hip internal rotation    Hip external rotation    Knee extension    Knee flexion    (Blank rows = not tested) (Key: WFL = within functional limits not formally assessed, * = concordant pain, s = stiffness/stretching sensation, NT = not tested)  Comments: deferred given acuity of surgery  LOWER EXTREMITY MMT:    MMT Right eval Left eval  Hip flexion    Hip abduction (modified sitting)    Hip internal rotation    Hip external rotation    Knee flexion    Knee extension    Ankle dorsiflexion     (Blank  rows = not tested) (Key: WFL = within functional limits not formally assessed, * = concordant pain, s = stiffness/stretching sensation, NT = not tested)  Comments: deferred on eval given proximity to surgery   FUNCTIONAL TESTS:  TUG: 11.41sec no AD, significant antalgic gait and BIL UE support for transfer, RLE kicked out  5xSTS: 15.48sec UE support, even BOS   09/25/23: 5xSTS 14.37sec no UE support, no pain, even BOS  TUG 8.00sec no AD, mild gait asymmetry but improved compared to initial eval SLS 2 sec LLE, 1 sec before LOB requiring UE support  GAIT: Distance walked: within clinic Assistive device utilized: None Level of assistance: Modified independence Comments: significant antalgic gait RLE, inc lateral trunk sway                                                                                                                                TREATMENT: [facility] Adult PT Treatment:                                                DATE: 09/27/23 Therapeutic Exercise: Nustep L 10 8 min Neuromuscular re-ed: Runners step 4 in 10# KB 15/15 STS from airex pad 10# KB Therapeutic Activity: Hip flexor stretch over bolster 30s x2 B Seated hamstring stretch 30s x2 B Sidestepping at counter with red power band 2 trips back and forth  [facility] Adult PT Treatment:                                                DATE: 09/25/23  Neuromuscular re-ed: SLS assessment + education SLS BIL LE w/ 2 finger support on counter 2x30sec cues for posture Standing hamstring curl off 2inch step, 2 finger support on counter 2x10 BIL  Therapeutic Activity: Nu step L9 LE/UE  10 min during subjective  5xSTS + education TUG + education 4 inch fwd step up 2x12 BIL emphasis on pacing/posture    [facility] Adult PT Treatment:                                                DATE: 09/20/23 Therapeutic Exercise: Nu step L4-L9 LE/UE 6 min during subjective Bosu TKE push 3x10 RLE HEP update + education/handout  Therapeutic  Activity: STS x10 BW from chair cues for trunk posture Staggered STS RLE bias x8 from raised mat 4 inch step ups 2x12 BIL no UE support 2inch lateral step down 2x5 RLE only cues for alignment Standing heel raise x10    [facility] Adult PT Treatment:                                                DATE: 09/13/23 Therapeutic Exercise: Nu step L8 LE/UE during subjective  Bosu TKE push 2x10 RLE cues for positioning  Therapeutic Activity: STS from mat x8 cues for BOS STS from chair 2x8 no UE support   4 inch step ups 3x8 RLE  cues for pacing/posture  Gait Training Styrofoam cup step overs 3 laps w/cues for posture, clearance, and heel strike Gait 2x84ft no AD cues for posture and foot strike, cleraance 4 cup stacked tap; x10 BIL LE for clearance Education on gait cycle, mechanics/posture     PATIENT EDUCATION:  Education details: rationale for interventions, HEP  Person educated: Patient Education method: Programmer, multimedia, Demonstration, Actor cues, Verbal cues Education comprehension: verbalized understanding, returned demonstration, verbal cues required, tactile cues required, and needs further education     HOME EXERCISE PROGRAM: Access Code: [email or web address] URL: [email or web address] Date: 09/20/2023 Prepared by: Mayme Spearman  Exercises - Sit to Stand with Armchair  - 2-3 x daily - 1 sets - 5 reps - Seated Long Arc Quad  - 2-3 x daily - 1 sets - 10 reps - Standing March with Counter Support  - 2-3 x daily - 1 sets - 8 reps - Heel Raises with Counter Support  - 2-3 x daily - 1 sets - 8 reps  ASSESSMENT:  CLINICAL IMPRESSION: Continues to do well, no distinct hip pain noted.  Progressed strengthening and aerobic work w/o setback.  Incorporated additional functional abduction strengthening and stretching tasks.     Eval impression:Patient is a 48 y.o. gentleman who was seen today for physical therapy evaluation and treatment for R THA DOS 08/20/23. Pt was admitted to  hospital after initial discharge over concern for skin integrity and swelling, new referral received from surgical team for OP PT. Pt endorses limitations in ADLs/mobility as expected post op, requiring AD at times and increased time/effort for mobility. No red flags in questioning/exam today. Exam is limited today given late check in, deferring focal ROM/MMT given acuity of surgery. On functional assessment pt demonstrates alterations in gait/transfer kinematics as noted above, 5xSTS and TUG both demonstrating altered mechanics, 5xSTS indicative of fall risk. No adverse events, tolerates exam/HEP well overall with education as above. Recommend skilled PT to address aforementioned deficits with aim of improving functional tolerance and reducing pain with typical activities. Pt departs today's session in no acute distress, all voiced concerns/questions addressed appropriately  from PT perspective.    OBJECTIVE IMPAIRMENTS: Abnormal gait, decreased activity tolerance, decreased balance, decreased endurance, decreased mobility, difficulty walking, decreased ROM, decreased strength, impaired perceived functional ability, improper body mechanics, postural dysfunction, and pain.   ACTIVITY LIMITATIONS: carrying, lifting, bending, standing, squatting, stairs, transfers, and locomotion level  PARTICIPATION LIMITATIONS: meal prep, cleaning, laundry, driving, and community activity  PERSONAL FACTORS: Age, Time since onset of injury/illness/exacerbation, and 3+ comorbidities: anxiety/depression, DM2, gout, HTN, OSA, BIL THA are also affecting patient's functional outcome.   REHAB POTENTIAL: Good  CLINICAL DECISION MAKING: Evolving/moderate complexity  EVALUATION COMPLEXITY: Moderate   GOALS:   SHORT TERM GOALS: Target date: 10/05/2023   Pt will demonstrate appropriate understanding and performance of initially prescribed HEP in order to facilitate improved independence with management of symptoms.   Baseline: HEP established  Goal status: INITIAL   2. Pt will report at least 25% improvement in overall pain levels over past week in order to facilitate improved tolerance to typical daily activities.   Baseline: 0-8/10  Goal status: INITIAL    LONG TERM GOALS: Target date: 11/02/2023  Pt will score 60 or greater on LEFS in order to demonstrate improved perception of function due to symptoms (MCID 9 pts) Baseline: 48/80 Goal status: INITIAL  2.  Pt will be able to perform TUG in less than or equal to 10 sec w/o mechanics grossly WNL and LRAD in order to indicate reduced risk of falling (cutoff score for fall risk 13.5 sec in community dwelling older adults per [facility] et al, 2000)  Baseline: 11sec significant antalgic gait, no AD  Goal status: INITIAL    3.  Pt will report at least 50% decrease in overall pain levels in past week in order to facilitate improved tolerance to basic ADLs/mobility.   Baseline: 0-8/10  Goal status: INITIAL    4.  Pt will be able to perform 5xSTS in less than or equal to 12sec no UE support in order to demonstrate reduced fall risk and improved functional independence (MCID 5xSTS = 2.3 sec). Baseline: 15sec UE support Goal status: INITIAL   5. Pt will be able to navigate up to a single flight of stairs w/ reciprocal pattern and no UE support in order to facilitate improved safety w/ home navigation.  Baseline: reports inc time/effort and altered mechanics  Goal status: INITIAL    PLAN:  PT FREQUENCY: 1-2x/week  PT DURATION: 8 weeks  PLANNED INTERVENTIONS: 97164- PT Re-evaluation, 97750- Physical Performance Testing, 97110-Therapeutic exercises, 97530- Therapeutic activity, [id number]- Neuromuscular re-education, 97535- Self Care, 13086- Manual therapy, [phone]- Gait training, Patient/Family education, Balance training, Stair training, Taping, Dry Needling, Joint mobilization, Cryotherapy, and Moist heat  PLAN FOR NEXT SESSION: Review/update HEP PRN.  Work on functional mobility, Equities trader, glute activation. Symptom modification strategies as indicated/appropriate.    Lovett Ruck PT, DPT 09/27/2023 2:53 PM   For all possible CPT codes, reference the Planned Interventions line above.     Check all conditions that are expected to impact treatment: {Conditions expected to impact treatment:Musculoskeletal disorders

## 2023-09-27 ENCOUNTER — Ambulatory Visit

## 2023-09-27 DIAGNOSIS — M25552 Pain in left hip: Secondary | ICD-10-CM

## 2023-09-27 DIAGNOSIS — R2689 Other abnormalities of gait and mobility: Secondary | ICD-10-CM | POA: Diagnosis not present

## 2023-09-27 DIAGNOSIS — M6281 Muscle weakness (generalized): Secondary | ICD-10-CM

## 2023-09-27 DIAGNOSIS — M25551 Pain in right hip: Secondary | ICD-10-CM

## 2023-09-28 NOTE — Therapy (Signed)
 OUTPATIENT PHYSICAL THERAPY TREATMENT   Patient Name: Frank Howe MRN: [id number] DOB:Sep 14, 1975, 48 y.o., male Today's Date: 10/02/2023  END OF SESSION:  PT End of Session - 10/02/23 1131     Visit Number 7    Number of Visits 17    Date for PT Re-Evaluation 11/02/23    Authorization Type [employer]    Authorization Time Period 13 visits 09/20/23-12/19/23    Authorization - Visit Number 7    Authorization - Number of Visits 13    PT Start Time 1130    PT Stop Time 1210    PT Time Calculation (min) 40 min    Activity Tolerance Patient tolerated treatment well    Behavior During Therapy [facility] for tasks assessed/performed                   Past Medical History:  Diagnosis Date   Anxiety    Arthritis    Complication of anesthesia    Fingers numb after surgery   Depression    Diabetes mellitus type 2 in obese 01/15/2021   Elevated blood pressure reading without diagnosis of hypertension    Fatty liver    Gout    History of kidney stones    Hypertension    Impaired fasting blood sugar    Obesity    OSA (obstructive sleep apnea)    Past Surgical History:  Procedure Laterality Date   TOTAL HIP ARTHROPLASTY Left 05/21/2023   Procedure: LEFT TOTAL HIP ARTHROPLASTY ANTERIOR APPROACH;  Surgeon: Wendolyn Hamburger, MD;  Location: [facility];  Service: Orthopedics;  Laterality: Left;   TOTAL HIP ARTHROPLASTY Right 08/20/2023   Procedure: ARTHROPLASTY, HIP, TOTAL, ANTERIOR APPROACH;  Surgeon: Wendolyn Hamburger, MD;  Location: [facility];  Service: Orthopedics;  Laterality: Right;  RIGHT TOTAL HIP ARTHROPLASTY ANTERIOR   UMBILICAL HERNIA REPAIR N/A 09/14/2017   Procedure: LAPAROSCOPIC UMBILICAL HERNIA;  Surgeon: Joyce Nixon, MD;  Location: [facility];  Service: General;  Laterality: N/A;   UMBILICAL HERNIA REPAIR N/A 02/11/2018   Procedure: LAPARASCOPIC REDO REPAIR OF RECURRENT UMBILICAL HERNIA WITH INSERTION OF MESH, LYSIS OF ADHESIONS;  Surgeon: Melvenia Stabs, MD;  Location: [facility];  Service: General;  Laterality: N/A;   XI ROBOTIC ASSISTED VENTRAL HERNIA N/A 03/01/2021   Procedure: ROBOTIC RECURRENT INCISIONAL HERNIA 9CMx9CM REPAIR WITH MESH AND REMOVAL OF INTRAPERITONEAL MESH;  Surgeon: Junie Olds, MD;  Location: [facility];  Service: General;  Laterality: N/A;   Patient Active Problem List   Diagnosis Date Noted   Acute bilateral deep vein thrombosis (DVT) of femoral veins (HCC) 08/24/2023   Moderate protein malnutrition (HCC) 08/24/2023   Hypokalemia 08/24/2023   Elevated CK 08/24/2023   Cellulitis of right lower extremity 08/24/2023   Osteoarthritis of right hip 08/15/2023   Arthritis of left hip 05/17/2023   Recurrent incisional hernia 03/01/2021   SBO (small bowel obstruction) (HCC) 01/15/2021   Essential hypertension 01/15/2021   Type 2 diabetes mellitus with obesity (HCC) 01/15/2021   Obesity, Class III, BMI 40-49.9 (morbid obesity) 01/15/2021   Umbilical hernia with obstruction but no gangrene 02/11/2018   Partial small bowel obstruction (HCC) 09/13/2017   Gout, chronic 07/19/2015   OSA (obstructive sleep apnea) 07/19/2015   Obesity 06/29/2015   Elevated uric acid in blood 06/29/2015   Impaired fasting blood sugar 06/28/2015   Knee swelling 06/28/2015   Right knee pain 06/28/2015   Elevated blood pressure reading without diagnosis of hypertension 06/28/2015   Routine general medical examination at  a health care facility 08/21/2011    PCP: Allene Ivan, MD   REFERRING PROVIDER: Wendolyn Hamburger, MD   REFERRING DIAG: s/p R THA Updated referral 08/31/23 post hospitalization, see media tab  THERAPY DIAG:  Other abnormalities of gait and mobility  Muscle weakness (generalized)  Pain in right hip  Rationale for Evaluation and Treatment: Rehabilitation  ONSET DATE: 08/20/23 R THA  SUBJECTIVE:   SUBJECTIVE STATEMENT: Reports some recent onset of R hip pain at ASIS region.  Denies trauma and unable to describe aggravating/relieving  factors.  Will see ortho today after PT session    Eval statement: Pt arrives s/p R THA 08/20/23. Was hospitalized after surgery out of concern for DVT, extensive work up performed; per 08/25/23 discharge summary:  "Admitted for CT findings concerning for DVT. LE US  was negative for DVT. History suggests cold injury due to prolonged exposure to ice pack." Pt reports compliance w/ blood thinner overall. Arrives w/o AD. Has been using AD most of the time. Followed up with surgeon on May 8th, no concerns at that visit per pt. States he is navigating stairs at home but inc time/effort.  No fevers/chills, no new N/T (reports L thigh numbness with prior surgery). Still some swelling that fluctuates based on activity. No drainage or reported redness/streaking, states he has been monitoring frequently. No distal symptoms.   PERTINENT HISTORY: anxiety/depression, DM2, gout, HTN, OSA, BIL THA  PAIN:  Are you having pain: none today   Per eval -  Location/description: R hip anteriorly,muscle spasms, stiffness Best-worst over past week: 0-8/10  - aggravating factors: initial walking and prolonged walking, transfers, stairs   - Easing factors: rest, medication     PRECAUTIONS: anterior hip per acute care notes, per pt physician cleared him from precautions at May 8th visit    WEIGHT BEARING RESTRICTIONS: Yes WBAT  FALLS:  Has patient fallen in last 6 months? No  LIVING ENVIRONMENT: Lives w/ spouse; requires stair navigation  OCCUPATION: not working currently - works in [employer], [employer], trying to get back by July   PLOF: Independent  PATIENT GOALS: get mobility and balance back to normal, swimming  NEXT MD VISIT: June or July   OBJECTIVE:  Note: Objective measures were completed at Evaluation unless otherwise noted.  DIAGNOSTIC FINDINGS:  CT angio 08/24/23: "IMPRESSION: 1. No arterial occlusion or significant stenosis is seen. Mild atherosclerosis as described above. 2. Suspected  partially occlusive hypodense thrombus in both superficial femoral arteries, on the left ending in the popliteal artery, on the right extending into the posterior tibial veins. Bilateral lower extremity DVT ultrasound Doppler study is recommended. 3. Superficial edema in the right hip area and upper thigh, with trace subcutaneous soft tissue air at the level of surgery. Findings could be normal postoperative edema or cellulitis. No localizing collection. 4. No deep space edema or fluid collections and no deep space free air identified in the right lower extremity. 5. Mild cardiomegaly. 6. Mild hepatic steatosis. 7. Constipation and diverticulosis. 8. Small umbilical and inguinal fat hernias. 9. Degenerative changes of the spine, knees, midfoot, and feet. 10. 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 ADDENDUM: Thrombus is suspected in the superficial femoral veins, not in the arteries as incorrectly stated in the body of the report and in the 2nd impression. Apologies for the error."  PATIENT SURVEYS:  LEFS: 48/80   COGNITION: Overall cognitive status: Within functional limits for tasks assessed     SENSATION: Pt reports chronic L thigh sensory issues,  no new sensory issues RLE   EDEMA/INSPECTION:  Incision obscured by tape/bandage but no drainage or streaking apparent, pt reports frequent monitoring and no issues   LOWER EXTREMITY ROM:      Right eval Left eval  Hip flexion    Hip extension    Hip internal rotation    Hip external rotation    Knee extension    Knee flexion    (Blank rows = not tested) (Key: WFL = within functional limits not formally assessed, * = concordant pain, s = stiffness/stretching sensation, NT = not tested)  Comments: deferred given acuity of surgery  LOWER EXTREMITY MMT:    MMT Right eval Left eval  Hip flexion    Hip abduction (modified sitting)    Hip internal rotation    Hip external rotation    Knee flexion    Knee extension     Ankle dorsiflexion     (Blank rows = not tested) (Key: WFL = within functional limits not formally assessed, * = concordant pain, s = stiffness/stretching sensation, NT = not tested)  Comments: deferred on eval given proximity to surgery   FUNCTIONAL TESTS:  TUG: 11.41sec no AD, significant antalgic gait and BIL UE support for transfer, RLE kicked out  5xSTS: 15.48sec UE support, even BOS   09/25/23: 5xSTS 14.37sec no UE support, no pain, even BOS  TUG 8.00sec no AD, mild gait asymmetry but improved compared to initial eval SLS 2 sec LLE, 1 sec before LOB requiring UE support  GAIT: Distance walked: within clinic Assistive device utilized: None Level of assistance: Modified independence Comments: significant antalgic gait RLE, inc lateral trunk sway                                                                                                                                TREATMENT: [facility] Adult PT Treatment:                                                DATE: 10/02/23 Therapeutic Exercise: Nustep L 6 8 min Neuromuscular re-ed: Runners step 4 in 10# KB 15/15 Sidestepping at counter with yellow power band 3 trips back and forth Therapeutic Activity: Hip flexor stretch over bolster 30s x2 B Supine hip fallouts BlaTB 15x B, 15/15 unilatral Bridge against BlaTB 15x S/L clams 15/15 BlaTB   [facility] Adult PT Treatment:                                                DATE: 09/27/23 Therapeutic Exercise: Nustep L 10 8 min Neuromuscular re-ed: Runners step 4 in 10# KB 15/15 STS from airex pad 10# KB Therapeutic Activity: Hip flexor  stretch over bolster 30s x2 B Seated hamstring stretch 30s x2 B Sidestepping at counter with red power band 2 trips back and forth  [facility] Adult PT Treatment:                                                DATE: 09/25/23  Neuromuscular re-ed: SLS assessment + education SLS BIL LE w/ 2 finger support on counter 2x30sec cues for posture Standing hamstring  curl off 2inch step, 2 finger support on counter 2x10 BIL  Therapeutic Activity: Nu step L9 LE/UE 10 min during subjective  5xSTS + education TUG + education 4 inch fwd step up 2x12 BIL emphasis on pacing/posture    [facility] Adult PT Treatment:                                                DATE: 09/20/23 Therapeutic Exercise: Nu step L4-L9 LE/UE 6 min during subjective Bosu TKE push 3x10 RLE HEP update + education/handout  Therapeutic Activity: STS x10 BW from chair cues for trunk posture Staggered STS RLE bias x8 from raised mat 4 inch step ups 2x12 BIL no UE support 2inch lateral step down 2x5 RLE only cues for alignment Standing heel raise x10    [facility] Adult PT Treatment:                                                DATE: 09/13/23 Therapeutic Exercise: Nu step L8 LE/UE during subjective  Bosu TKE push 2x10 RLE cues for positioning  Therapeutic Activity: STS from mat x8 cues for BOS STS from chair 2x8 no UE support   4 inch step ups 3x8 RLE  cues for pacing/posture  Gait Training Styrofoam cup step overs 3 laps w/cues for posture, clearance, and heel strike Gait 2x34ft no AD cues for posture and foot strike, cleraance 4 cup stacked tap; x10 BIL LE for clearance Education on gait cycle, mechanics/posture     PATIENT EDUCATION:  Education details: rationale for interventions, HEP  Person educated: Patient Education method: Programmer, multimedia, Demonstration, Actor cues, Verbal cues Education comprehension: verbalized understanding, returned demonstration, verbal cues required, tactile cues required, and needs further education     HOME EXERCISE PROGRAM: Access Code: [email or web address] URL: [email or web address] Date: 09/20/2023 Prepared by: Mayme Spearman  Exercises - Sit to Stand with Armchair  - 2-3 x daily - 1 sets - 5 reps - Seated Long Arc Quad  - 2-3 x daily - 1 sets - 10 reps - Standing March with Counter Support  - 2-3 x daily - 1 sets - 8 reps - Heel  Raises with Counter Support  - 2-3 x daily - 1 sets - 8 reps  ASSESSMENT:  CLINICAL IMPRESSION: Treatment limited to accommodate recent onset of R hip pain and MD appointment this afternoon.  No new tasks or resistance added     Eval impression:Patient is a 48 y.o. gentleman who was seen today for physical therapy evaluation and treatment for R THA DOS 08/20/23. Pt was admitted to hospital after initial discharge over concern for skin integrity and swelling, new  referral received from surgical team for OP PT. Pt endorses limitations in ADLs/mobility as expected post op, requiring AD at times and increased time/effort for mobility. No red flags in questioning/exam today. Exam is limited today given late check in, deferring focal ROM/MMT given acuity of surgery. On functional assessment pt demonstrates alterations in gait/transfer kinematics as noted above, 5xSTS and TUG both demonstrating altered mechanics, 5xSTS indicative of fall risk. No adverse events, tolerates exam/HEP well overall with education as above. Recommend skilled PT to address aforementioned deficits with aim of improving functional tolerance and reducing pain with typical activities. Pt departs today's session in no acute distress, all voiced concerns/questions addressed appropriately from PT perspective.    OBJECTIVE IMPAIRMENTS: Abnormal gait, decreased activity tolerance, decreased balance, decreased endurance, decreased mobility, difficulty walking, decreased ROM, decreased strength, impaired perceived functional ability, improper body mechanics, postural dysfunction, and pain.   ACTIVITY LIMITATIONS: carrying, lifting, bending, standing, squatting, stairs, transfers, and locomotion level  PARTICIPATION LIMITATIONS: meal prep, cleaning, laundry, driving, and community activity  PERSONAL FACTORS: Age, Time since onset of injury/illness/exacerbation, and 3+ comorbidities: anxiety/depression, DM2, gout, HTN, OSA, BIL THA are also  affecting patient's functional outcome.   REHAB POTENTIAL: Good  CLINICAL DECISION MAKING: Evolving/moderate complexity  EVALUATION COMPLEXITY: Moderate   GOALS:   SHORT TERM GOALS: Target date: 10/05/2023   Pt will demonstrate appropriate understanding and performance of initially prescribed HEP in order to facilitate improved independence with management of symptoms.  Baseline: HEP established  Goal status: INITIAL   2. Pt will report at least 25% improvement in overall pain levels over past week in order to facilitate improved tolerance to typical daily activities.   Baseline: 0-8/10  Goal status: INITIAL    LONG TERM GOALS: Target date: 11/02/2023  Pt will score 60 or greater on LEFS in order to demonstrate improved perception of function due to symptoms (MCID 9 pts) Baseline: 48/80 Goal status: INITIAL  2.  Pt will be able to perform TUG in less than or equal to 10 sec w/o mechanics grossly WNL and LRAD in order to indicate reduced risk of falling (cutoff score for fall risk 13.5 sec in community dwelling older adults per [facility] et al, 2000)  Baseline: 11sec significant antalgic gait, no AD  Goal status: INITIAL    3.  Pt will report at least 50% decrease in overall pain levels in past week in order to facilitate improved tolerance to basic ADLs/mobility.   Baseline: 0-8/10  Goal status: INITIAL    4.  Pt will be able to perform 5xSTS in less than or equal to 12sec no UE support in order to demonstrate reduced fall risk and improved functional independence (MCID 5xSTS = 2.3 sec). Baseline: 15sec UE support Goal status: INITIAL   5. Pt will be able to navigate up to a single flight of stairs w/ reciprocal pattern and no UE support in order to facilitate improved safety w/ home navigation.  Baseline: reports inc time/effort and altered mechanics  Goal status: INITIAL    PLAN:  PT FREQUENCY: 1-2x/week  PT DURATION: 8 weeks  PLANNED INTERVENTIONS: 97164- PT  Re-evaluation, 97750- Physical Performance Testing, 97110-Therapeutic exercises, 97530- Therapeutic activity, [id number]- Neuromuscular re-education, 97535- Self Care, 09811- Manual therapy, [phone]- Gait training, Patient/Family education, Balance training, Stair training, Taping, Dry Needling, Joint mobilization, Cryotherapy, and Moist heat  PLAN FOR NEXT SESSION: Review/update HEP PRN. Work on functional mobility, Equities trader, glute activation. Symptom modification strategies as indicated/appropriate.    Lovett Ruck PT,  DPT 10/02/2023 12:56 PM   For all possible CPT codes, reference the Planned Interventions line above.     Check all conditions that are expected to impact treatment: {Conditions expected to impact treatment:Musculoskeletal disorders

## 2023-10-02 ENCOUNTER — Ambulatory Visit

## 2023-10-02 DIAGNOSIS — M25551 Pain in right hip: Secondary | ICD-10-CM

## 2023-10-02 DIAGNOSIS — R2689 Other abnormalities of gait and mobility: Secondary | ICD-10-CM | POA: Diagnosis not present

## 2023-10-02 DIAGNOSIS — M6281 Muscle weakness (generalized): Secondary | ICD-10-CM

## 2023-10-04 ENCOUNTER — Ambulatory Visit: Admitting: Physical Therapy

## 2023-10-04 ENCOUNTER — Encounter: Payer: Self-pay | Admitting: Physical Therapy

## 2023-10-04 DIAGNOSIS — M25552 Pain in left hip: Secondary | ICD-10-CM

## 2023-10-04 DIAGNOSIS — M25551 Pain in right hip: Secondary | ICD-10-CM

## 2023-10-04 DIAGNOSIS — R2689 Other abnormalities of gait and mobility: Secondary | ICD-10-CM

## 2023-10-04 DIAGNOSIS — M6281 Muscle weakness (generalized): Secondary | ICD-10-CM

## 2023-10-04 NOTE — Therapy (Signed)
 OUTPATIENT PHYSICAL THERAPY TREATMENT   Patient Name: Frank Howe MRN: [id number] DOB:November 29, 1975, 48 y.o., male Today's Date: 10/04/2023  END OF SESSION:  PT End of Session - 10/04/23 1136     Visit Number 8    Number of Visits 17    Date for PT Re-Evaluation 11/02/23    Authorization Type [employer]    Authorization Time Period 13 visits 09/20/23-12/19/23    Authorization - Visit Number 8    Authorization - Number of Visits 13    PT Start Time 1135    PT Stop Time 1213    PT Time Calculation (min) 38 min    Activity Tolerance Patient tolerated treatment well    Behavior During Therapy WFL for tasks assessed/performed                Past Medical History:  Diagnosis Date   Anxiety    Arthritis    Complication of anesthesia    Fingers numb after surgery   Depression    Diabetes mellitus type 2 in obese 01/15/2021   Elevated blood pressure reading without diagnosis of hypertension    Fatty liver    Gout    History of kidney stones    Hypertension    Impaired fasting blood sugar    Obesity    OSA (obstructive sleep apnea)    Past Surgical History:  Procedure Laterality Date   TOTAL HIP ARTHROPLASTY Left 05/21/2023   Procedure: LEFT TOTAL HIP ARTHROPLASTY ANTERIOR APPROACH;  Surgeon: Wendolyn Hamburger, MD;  Location: [facility];  Service: Orthopedics;  Laterality: Left;   TOTAL HIP ARTHROPLASTY Right 08/20/2023   Procedure: ARTHROPLASTY, HIP, TOTAL, ANTERIOR APPROACH;  Surgeon: Wendolyn Hamburger, MD;  Location: [facility];  Service: Orthopedics;  Laterality: Right;  RIGHT TOTAL HIP ARTHROPLASTY ANTERIOR   UMBILICAL HERNIA REPAIR N/A 09/14/2017   Procedure: LAPAROSCOPIC UMBILICAL HERNIA;  Surgeon: Joyce Nixon, MD;  Location: [facility];  Service: General;  Laterality: N/A;   UMBILICAL HERNIA REPAIR N/A 02/11/2018   Procedure: LAPARASCOPIC REDO REPAIR OF RECURRENT UMBILICAL HERNIA WITH INSERTION OF MESH, LYSIS OF ADHESIONS;  Surgeon: Melvenia Stabs, MD;  Location: [facility];   Service: General;  Laterality: N/A;   XI ROBOTIC ASSISTED VENTRAL HERNIA N/A 03/01/2021   Procedure: ROBOTIC RECURRENT INCISIONAL HERNIA 9CMx9CM REPAIR WITH MESH AND REMOVAL OF INTRAPERITONEAL MESH;  Surgeon: Junie Olds, MD;  Location: [facility];  Service: General;  Laterality: N/A;   Patient Active Problem List   Diagnosis Date Noted   Acute bilateral deep vein thrombosis (DVT) of femoral veins (HCC) 08/24/2023   Moderate protein malnutrition (HCC) 08/24/2023   Hypokalemia 08/24/2023   Elevated CK 08/24/2023   Cellulitis of right lower extremity 08/24/2023   Osteoarthritis of right hip 08/15/2023   Arthritis of left hip 05/17/2023   Recurrent incisional hernia 03/01/2021   SBO (small bowel obstruction) (HCC) 01/15/2021   Essential hypertension 01/15/2021   Type 2 diabetes mellitus with obesity (HCC) 01/15/2021   Obesity, Class III, BMI 40-49.9 (morbid obesity) 01/15/2021   Umbilical hernia with obstruction but no gangrene 02/11/2018   Partial small bowel obstruction (HCC) 09/13/2017   Gout, chronic 07/19/2015   OSA (obstructive sleep apnea) 07/19/2015   Obesity 06/29/2015   Elevated uric acid in blood 06/29/2015   Impaired fasting blood sugar 06/28/2015   Knee swelling 06/28/2015   Right knee pain 06/28/2015   Elevated blood pressure reading without diagnosis of hypertension 06/28/2015   Routine general medical examination at a health care  facility 08/21/2011    PCP: Allene Ivan, MD   REFERRING PROVIDER: Wendolyn Hamburger, MD   REFERRING DIAG: s/p R THA Updated referral 08/31/23 post hospitalization, see media tab  THERAPY DIAG:  Other abnormalities of gait and mobility  Muscle weakness (generalized)  Pain in right hip  Pain in left hip  Rationale for Evaluation and Treatment: Rehabilitation  ONSET DATE: 08/20/23 R THA  SUBJECTIVE:   SUBJECTIVE STATEMENT:  .su    Eval statement: Pt arrives s/p R THA 08/20/23. Was hospitalized after surgery out of concern  for DVT, extensive work up performed; per 08/25/23 discharge summary:  Admitted for CT findings concerning for DVT. LE US  was negative for DVT. History suggests cold injury due to prolonged exposure to ice pack. Pt reports compliance w/ blood thinner overall. Arrives w/o AD. Has been using AD most of the time. Followed up with surgeon on May 8th, no concerns at that visit per pt. States he is navigating stairs at home but inc time/effort.  No fevers/chills, no new N/T (reports L thigh numbness with prior surgery). Still some swelling that fluctuates based on activity. No drainage or reported redness/streaking, states he has been monitoring frequently. No distal symptoms.   PERTINENT HISTORY: anxiety/depression, DM2, gout, HTN, OSA, BIL THA  PAIN:  Are you having pain: none today   Per eval -  Location/description: R hip anteriorly,muscle spasms, stiffness Best-worst over past week: 0-8/10  - aggravating factors: initial walking and prolonged walking, transfers, stairs   - Easing factors: rest, medication     PRECAUTIONS: anterior hip per acute care notes, per pt physician cleared him from precautions at May 8th visit    WEIGHT BEARING RESTRICTIONS: Yes WBAT  FALLS:  Has patient fallen in last 6 months? No  LIVING ENVIRONMENT: Lives w/ spouse; requires stair navigation  OCCUPATION: not working currently - works in [employer], [employer], trying to get back by July   PLOF: Independent  PATIENT GOALS: get mobility and balance back to normal, swimming  NEXT MD VISIT: June or July   OBJECTIVE:  Note: Objective measures were completed at Evaluation unless otherwise noted.  DIAGNOSTIC FINDINGS:  CT angio 08/24/23: IMPRESSION: 1. No arterial occlusion or significant stenosis is seen. Mild atherosclerosis as described above. 2. Suspected partially occlusive hypodense thrombus in both superficial femoral arteries, on the left ending in the popliteal artery, on the right extending  into the posterior tibial veins. Bilateral lower extremity DVT ultrasound Doppler study is recommended. 3. Superficial edema in the right hip area and upper thigh, with trace subcutaneous soft tissue air at the level of surgery. Findings could be normal postoperative edema or cellulitis. No localizing collection. 4. No deep space edema or fluid collections and no deep space free air identified in the right lower extremity. 5. Mild cardiomegaly. 6. Mild hepatic steatosis. 7. Constipation and diverticulosis. 8. Small umbilical and inguinal fat hernias. 9. Degenerative changes of the spine, knees, midfoot, and feet. 10. 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 ADDENDUM: Thrombus is suspected in the superficial femoral veins, not in the arteries as incorrectly stated in the body of the report and in the 2nd impression. Apologies for the error.  PATIENT SURVEYS:  LEFS: 48/80   COGNITION: Overall cognitive status: Within functional limits for tasks assessed     SENSATION: Pt reports chronic L thigh sensory issues, no new sensory issues RLE   EDEMA/INSPECTION:  Incision obscured by tape/bandage but no drainage or streaking apparent, pt reports frequent monitoring and  no issues   LOWER EXTREMITY ROM:      Right eval Left eval  Hip flexion    Hip extension    Hip internal rotation    Hip external rotation    Knee extension    Knee flexion    (Blank rows = not tested) (Key: WFL = within functional limits not formally assessed, * = concordant pain, s = stiffness/stretching sensation, NT = not tested)  Comments: deferred given acuity of surgery  LOWER EXTREMITY MMT:    MMT Right eval Left eval  Hip flexion    Hip abduction (modified sitting)    Hip internal rotation    Hip external rotation    Knee flexion    Knee extension    Ankle dorsiflexion     (Blank rows = not tested) (Key: WFL = within functional limits not formally assessed, * = concordant pain, s =  stiffness/stretching sensation, NT = not tested)  Comments: deferred on eval given proximity to surgery   FUNCTIONAL TESTS:  TUG: 11.41sec no AD, significant antalgic gait and BIL UE support for transfer, RLE kicked out  5xSTS: 15.48sec UE support, even BOS   09/25/23: 5xSTS 14.37sec no UE support, no pain, even BOS  TUG 8.00sec no AD, mild gait asymmetry but improved compared to initial eval SLS 2 sec LLE, 1 sec before LOB requiring UE support  GAIT: Distance walked: within clinic Assistive device utilized: None Level of assistance: Modified independence Comments: significant antalgic gait RLE, inc lateral trunk sway                                                                                                                                TREATMENT: [facility] Adult PT Treatment:                                                DATE: 10/04/2023 Therapeutic Activity: NuStep 8' for activity tolerance Runners step 4 2x12, hold 2s, 10#KB Monster walks 3x 13ft fwd/ 85ft bkwd, YTB Banded marching 2x12, UE a. PRN, YTB  Standing hip flexor stretch 2x45s   [facility] Adult PT Treatment:                                                DATE: 10/02/23 Therapeutic Exercise: Nustep L 6 8 min Neuromuscular re-ed: Runners step 4 in 10# KB 15/15 Sidestepping at counter with yellow power band 3 trips back and forth Therapeutic Activity: Hip flexor stretch over bolster 30s x2 B Supine hip fallouts BlaTB 15x B, 15/15 unilatral Bridge against BlaTB 15x S/L clams 15/15 BlaTB     PATIENT EDUCATION:  Education details: rationale for interventions, HEP  Person  educated: Patient Education method: Explanation, Demonstration, Tactile cues, Verbal cues Education comprehension: verbalized understanding, returned demonstration, verbal cues required, tactile cues required, and needs further education     HOME EXERCISE PROGRAM: Access Code: [email or web address] URL: [email or web address] Date:  09/20/2023 Prepared by: Mayme Spearman  Exercises - Sit to Stand with Armchair  - 2-3 x daily - 1 sets - 5 reps - Seated Long Arc Quad  - 2-3 x daily - 1 sets - 10 reps - Standing March with Counter Support  - 2-3 x daily - 1 sets - 8 reps - Heel Raises with Counter Support  - 2-3 x daily - 1 sets - 8 reps  ASSESSMENT:  CLINICAL IMPRESSION:  Pt attended physical therapy session for continuation of treatment regarding R THA. Today's treatment focused on improvement of  proximal hip stability/and functional strengthening. Pt showed great tolerance to administered treatment with no adverse effects by the end of session. Pt continues to have min pain in the anterior hip, worked with surgeon recently  Pt Skilled intervention was utilized via activity modification for pt tolerance with task completion, functional progression/regression promoting best outcomes inline with current rehab goals, as well as minimal verbal/tactile cuing alongside no physical assistance for safe and appropriate performance of today's activities.   Eval impression:Patient is a 48 y.o. gentleman who was seen today for physical therapy evaluation and treatment for R THA DOS 08/20/23. Pt was admitted to hospital after initial discharge over concern for skin integrity and swelling, new referral received from surgical team for OP PT. Pt endorses limitations in ADLs/mobility as expected post op, requiring AD at times and increased time/effort for mobility. No red flags in questioning/exam today. Exam is limited today given late check in, deferring focal ROM/MMT given acuity of surgery. On functional assessment pt demonstrates alterations in gait/transfer kinematics as noted above, 5xSTS and TUG both demonstrating altered mechanics, 5xSTS indicative of fall risk. No adverse events, tolerates exam/HEP well overall with education as above. Recommend skilled PT to address aforementioned deficits with aim of improving functional tolerance and  reducing pain with typical activities. Pt departs today's session in no acute distress, all voiced concerns/questions addressed appropriately from PT perspective.    OBJECTIVE IMPAIRMENTS: Abnormal gait, decreased activity tolerance, decreased balance, decreased endurance, decreased mobility, difficulty walking, decreased ROM, decreased strength, impaired perceived functional ability, improper body mechanics, postural dysfunction, and pain.   ACTIVITY LIMITATIONS: carrying, lifting, bending, standing, squatting, stairs, transfers, and locomotion level  PARTICIPATION LIMITATIONS: meal prep, cleaning, laundry, driving, and community activity  PERSONAL FACTORS: Age, Time since onset of injury/illness/exacerbation, and 3+ comorbidities: anxiety/depression, DM2, gout, HTN, OSA, BIL THA are also affecting patient's functional outcome.   REHAB POTENTIAL: Good  CLINICAL DECISION MAKING: Evolving/moderate complexity  EVALUATION COMPLEXITY: Moderate   GOALS:   SHORT TERM GOALS: Target date: 10/05/2023   Pt will demonstrate appropriate understanding and performance of initially prescribed HEP in order to facilitate improved independence with management of symptoms.  Baseline: HEP established  Goal status: INITIAL   2. Pt will report at least 25% improvement in overall pain levels over past week in order to facilitate improved tolerance to typical daily activities.   Baseline: 0-8/10  Goal status: INITIAL    LONG TERM GOALS: Target date: 11/02/2023  Pt will score 60 or greater on LEFS in order to demonstrate improved perception of function due to symptoms (MCID 9 pts) Baseline: 48/80 Goal status: INITIAL  2.  Pt will be able to perform TUG in less than  or equal to 10 sec w/o mechanics grossly WNL and LRAD in order to indicate reduced risk of falling (cutoff score for fall risk 13.5 sec in community dwelling older adults per [facility] et al, 2000)  Baseline: 11sec significant antalgic gait,  no AD  Goal status: INITIAL    3.  Pt will report at least 50% decrease in overall pain levels in past week in order to facilitate improved tolerance to basic ADLs/mobility.   Baseline: 0-8/10  Goal status: INITIAL    4.  Pt will be able to perform 5xSTS in less than or equal to 12sec no UE support in order to demonstrate reduced fall risk and improved functional independence (MCID 5xSTS = 2.3 sec). Baseline: 15sec UE support Goal status: INITIAL   5. Pt will be able to navigate up to a single flight of stairs w/ reciprocal pattern and no UE support in order to facilitate improved safety w/ home navigation.  Baseline: reports inc time/effort and altered mechanics  Goal status: INITIAL    PLAN:  PT FREQUENCY: 1-2x/week  PT DURATION: 8 weeks  PLANNED INTERVENTIONS: 97164- PT Re-evaluation, 97750- Physical Performance Testing, 97110-Therapeutic exercises, 97530- Therapeutic activity, [id number]- Neuromuscular re-education, 97535- Self Care, [id number]- Manual therapy, [phone]- Gait training, Patient/Family education, Balance training, Stair training, Taping, Dry Needling, Joint mobilization, Cryotherapy, and Moist heat  PLAN FOR NEXT SESSION: Review/update HEP PRN. Work on functional mobility, Equities trader, glute activation. Symptom modification strategies as indicated/appropriate.    Albesa Huguenin, PT, DPT 10/04/2023, 12:17 PM   For all possible CPT codes, reference the Planned Interventions line above.     Check all conditions that are expected to impact treatment: {Conditions expected to impact treatment:Musculoskeletal disorders

## 2023-10-08 ENCOUNTER — Ambulatory Visit

## 2023-10-08 DIAGNOSIS — M25551 Pain in right hip: Secondary | ICD-10-CM

## 2023-10-08 DIAGNOSIS — R2689 Other abnormalities of gait and mobility: Secondary | ICD-10-CM

## 2023-10-08 DIAGNOSIS — M6281 Muscle weakness (generalized): Secondary | ICD-10-CM

## 2023-10-08 NOTE — Therapy (Signed)
 OUTPATIENT PHYSICAL THERAPY TREATMENT/DISCHARGE SUMMARY   Patient Name: Frank Howe MRN: [id number] DOB:[birth year], 48 y.o., male Today's Date: 10/08/2023  PHYSICAL THERAPY DISCHARGE SUMMARY  Visits from Start of Care: 9  Current functional level related to goals / functional outcomes: Goals met   Remaining deficits: weakness   Education / Equipment: HEP   Patient agrees to discharge. Patient goals were met. Patient is being discharged due to being pleased with the current functional level.   END OF SESSION:  PT End of Session - 10/08/23 1041     Visit Number 9    Number of Visits 17    Date for PT Re-Evaluation 11/02/23    Authorization Type [employer] healthy blue    Authorization Time Period 13 visits 09/20/23-12/19/23    Authorization - Visit Number 9    Authorization - Number of Visits 13    PT Start Time 1040    PT Stop Time 1120    PT Time Calculation (min) 40 min    Activity Tolerance Patient tolerated treatment well    Behavior During Therapy WFL for tasks assessed/performed                 Past Medical History:  Diagnosis Date   Anxiety    Arthritis    Complication of anesthesia    Fingers numb after surgery   Depression    Diabetes mellitus type 2 in obese 01/15/2021   Elevated blood pressure reading without diagnosis of hypertension    Fatty liver    Gout    History of kidney stones    Hypertension    Impaired fasting blood sugar    Obesity    OSA (obstructive sleep apnea)    Past Surgical History:  Procedure Laterality Date   TOTAL HIP ARTHROPLASTY Left 05/21/2023   Procedure: LEFT TOTAL HIP ARTHROPLASTY ANTERIOR APPROACH;  Surgeon: Wendolyn Hamburger, MD;  Location: [facility];  Service: Orthopedics;  Laterality: Left;   TOTAL HIP ARTHROPLASTY Right 08/20/2023   Procedure: ARTHROPLASTY, HIP, TOTAL, ANTERIOR APPROACH;  Surgeon: Wendolyn Hamburger, MD;  Location: [facility];  Service: Orthopedics;  Laterality: Right;  RIGHT TOTAL HIP ARTHROPLASTY ANTERIOR    UMBILICAL HERNIA REPAIR N/A 09/14/2017   Procedure: LAPAROSCOPIC UMBILICAL HERNIA;  Surgeon: Joyce Nixon, MD;  Location: [facility];  Service: General;  Laterality: N/A;   UMBILICAL HERNIA REPAIR N/A 02/11/2018   Procedure: LAPARASCOPIC REDO REPAIR OF RECURRENT UMBILICAL HERNIA WITH INSERTION OF MESH, LYSIS OF ADHESIONS;  Surgeon: Melvenia Stabs, MD;  Location: [facility];  Service: General;  Laterality: N/A;   XI ROBOTIC ASSISTED VENTRAL HERNIA N/A 03/01/2021   Procedure: ROBOTIC RECURRENT INCISIONAL HERNIA 9CMx9CM REPAIR WITH MESH AND REMOVAL OF INTRAPERITONEAL MESH;  Surgeon: Junie Olds, MD;  Location: [facility];  Service: General;  Laterality: N/A;   Patient Active Problem List   Diagnosis Date Noted   Acute bilateral deep vein thrombosis (DVT) of femoral veins (HCC) 08/24/2023   Moderate protein malnutrition (HCC) 08/24/2023   Hypokalemia 08/24/2023   Elevated CK 08/24/2023   Cellulitis of right lower extremity 08/24/2023   Osteoarthritis of right hip 08/15/2023   Arthritis of left hip 05/17/2023   Recurrent incisional hernia 03/01/2021   SBO (small bowel obstruction) (HCC) 01/15/2021   Essential hypertension 01/15/2021   Type 2 diabetes mellitus with obesity (HCC) 01/15/2021   Obesity, Class III, BMI 40-49.9 (morbid obesity) 01/15/2021   Umbilical hernia with obstruction but no gangrene 02/11/2018   Partial small bowel obstruction (HCC) 09/13/2017  Gout, chronic 07/19/2015   OSA (obstructive sleep apnea) 07/19/2015   Obesity 06/29/2015   Elevated uric acid in blood 06/29/2015   Impaired fasting blood sugar 06/28/2015   Knee swelling 06/28/2015   Right knee pain 06/28/2015   Elevated blood pressure reading without diagnosis of hypertension 06/28/2015   Routine general medical examination at a health care facility 08/21/2011    PCP: Allene Ivan, MD   REFERRING PROVIDER: Wendolyn Hamburger, MD   REFERRING DIAG: s/p R THA Updated referral 08/31/23 post hospitalization,  see media tab  THERAPY DIAG:  Other abnormalities of gait and mobility  Muscle weakness (generalized)  Pain in right hip  Rationale for Evaluation and Treatment: Rehabilitation  ONSET DATE: 08/20/23 R THA  SUBJECTIVE:   SUBJECTIVE STATEMENT: Patient rates his function at 95% and feels confident to DC today  Eval statement: Pt arrives s/p R THA 08/20/23. Was hospitalized after surgery out of concern for DVT, extensive work up performed; per 08/25/23 discharge summary:  Admitted for CT findings concerning for DVT. LE US  was negative for DVT. History suggests cold injury due to prolonged exposure to ice pack. Pt reports compliance w/ blood thinner overall. Arrives w/o AD. Has been using AD most of the time. Followed up with surgeon on May 8th, no concerns at that visit per pt. States he is navigating stairs at home but inc time/effort.  No fevers/chills, no new N/T (reports L thigh numbness with prior surgery). Still some swelling that fluctuates based on activity. No drainage or reported redness/streaking, states he has been monitoring frequently. No distal symptoms.   PERTINENT HISTORY: anxiety/depression, DM2, gout, HTN, OSA, BIL THA  PAIN:  Are you having pain: none today   Per eval -  Location/description: R hip anteriorly,muscle spasms, stiffness Best-worst over past week: 0-8/10  - aggravating factors: initial walking and prolonged walking, transfers, stairs   - Easing factors: rest, medication     PRECAUTIONS: anterior hip per acute care notes, per pt physician cleared him from precautions at May 8th visit    WEIGHT BEARING RESTRICTIONS: Yes WBAT  FALLS:  Has patient fallen in last 6 months? No  LIVING ENVIRONMENT: Lives w/ spouse; requires stair navigation  OCCUPATION: not working currently - works in [employer], [employer], trying to get back by July   PLOF: Independent  PATIENT GOALS: get mobility and balance back to normal, swimming  NEXT MD VISIT: June or  July   OBJECTIVE:  Note: Objective measures were completed at Evaluation unless otherwise noted.  DIAGNOSTIC FINDINGS:  CT angio 08/24/23: IMPRESSION: 1. No arterial occlusion or significant stenosis is seen. Mild atherosclerosis as described above. 2. Suspected partially occlusive hypodense thrombus in both superficial femoral arteries, on the left ending in the popliteal artery, on the right extending into the posterior tibial veins. Bilateral lower extremity DVT ultrasound Doppler study is recommended. 3. Superficial edema in the right hip area and upper thigh, with trace subcutaneous soft tissue air at the level of surgery. Findings could be normal postoperative edema or cellulitis. No localizing collection. 4. No deep space edema or fluid collections and no deep space free air identified in the right lower extremity. 5. Mild cardiomegaly. 6. Mild hepatic steatosis. 7. Constipation and diverticulosis. 8. Small umbilical and inguinal fat hernias. 9. Degenerative changes of the spine, knees, midfoot, and feet. 10. 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 ADDENDUM: Thrombus is suspected in the superficial femoral veins, not in the arteries as incorrectly stated in the body of the  report and in the 2nd impression. Apologies for the error.  PATIENT SURVEYS:  LEFS: 48/80   COGNITION: Overall cognitive status: Within functional limits for tasks assessed     SENSATION: Pt reports chronic L thigh sensory issues, no new sensory issues RLE   EDEMA/INSPECTION:  Incision obscured by tape/bandage but no drainage or streaking apparent, pt reports frequent monitoring and no issues   LOWER EXTREMITY ROM:      Right eval Left eval  Hip flexion    Hip extension    Hip internal rotation    Hip external rotation    Knee extension    Knee flexion    (Blank rows = not tested) (Key: WFL = within functional limits not formally assessed, * = concordant pain, s =  stiffness/stretching sensation, NT = not tested)  Comments: deferred given acuity of surgery  LOWER EXTREMITY MMT:    MMT Right eval Left eval  Hip flexion    Hip abduction (modified sitting)    Hip internal rotation    Hip external rotation    Knee flexion    Knee extension    Ankle dorsiflexion     (Blank rows = not tested) (Key: WFL = within functional limits not formally assessed, * = concordant pain, s = stiffness/stretching sensation, NT = not tested)  Comments: deferred on eval given proximity to surgery   FUNCTIONAL TESTS:  TUG: 11.41sec no AD, significant antalgic gait and BIL UE support for transfer, RLE kicked out; 10/08/23 7s 5xSTS: 15.48sec UE support, even BOS; 10/08/23 12s   09/25/23: 5xSTS 14.37sec no UE support, no pain, even BOS  TUG 8.00sec no AD, mild gait asymmetry but improved compared to initial eval SLS 2 sec LLE, 1 sec before LOB requiring UE support  GAIT: Distance walked: within clinic Assistive device utilized: None Level of assistance: Modified independence Comments: significant antalgic gait RLE, inc lateral trunk sway                                                                                                                                TREATMENT: [facility] Adult PT Treatment:                                                DATE: 10/08/23 Therapeutic Exercise: Nustep L 6 8 min Neuromuscular re-ed: Runners step 4 in 15# KB 15/15 Sidestepping at counter with red power band 3 trips back and forth  [facility] Adult PT Treatment:                                                DATE: 10/04/2023 Therapeutic Activity: NuStep 8' for activity tolerance Runners step 4  2x12, hold 2s, 10#KB Monster walks 3x 21ft fwd/ 65ft bkwd, YTB Banded marching 2x12, UE a. PRN, YTB  Standing hip flexor stretch 2x45s   [facility] Adult PT Treatment:                                                DATE: 10/02/23 Therapeutic Exercise: Nustep L 6 8 min Neuromuscular  re-ed: Runners step 4 in 10# KB 15/15 Sidestepping at counter with yellow power band 3 trips back and forth Therapeutic Activity: Hip flexor stretch over bolster 30s x2 B Supine hip fallouts BlaTB 15x B, 15/15 unilatral Bridge against BlaTB 15x S/L clams 15/15 BlaTB     PATIENT EDUCATION:  Education details: rationale for interventions, HEP  Person educated: Patient Education method: Explanation, Demonstration, Tactile cues, Verbal cues Education comprehension: verbalized understanding, returned demonstration, verbal cues required, tactile cues required, and needs further education     HOME EXERCISE PROGRAM: Access Code: [email or web address] URL: [email or web address] Date: 09/20/2023 Prepared by: Mayme Spearman  Exercises - Sit to Stand with Armchair  - 2-3 x daily - 1 sets - 5 reps - Seated Long Arc Quad  - 2-3 x daily - 1 sets - 10 reps - Standing March with Counter Support  - 2-3 x daily - 1 sets - 8 reps - Heel Raises with Counter Support  - 2-3 x daily - 1 sets - 8 reps  ASSESSMENT:  CLINICAL IMPRESSION: Patient rates himself at 95%, main concern is R hip weakness.  All rehab goals met and patient confident to DC   Eval impression:Patient is a 48 y.o. gentleman who was seen today for physical therapy evaluation and treatment for R THA DOS 08/20/23. Pt was admitted to hospital after initial discharge over concern for skin integrity and swelling, new referral received from surgical team for OP PT. Pt endorses limitations in ADLs/mobility as expected post op, requiring AD at times and increased time/effort for mobility. No red flags in questioning/exam today. Exam is limited today given late check in, deferring focal ROM/MMT given acuity of surgery. On functional assessment pt demonstrates alterations in gait/transfer kinematics as noted above, 5xSTS and TUG both demonstrating altered mechanics, 5xSTS indicative of fall risk. No adverse events, tolerates exam/HEP well overall  with education as above. Recommend skilled PT to address aforementioned deficits with aim of improving functional tolerance and reducing pain with typical activities. Pt departs today's session in no acute distress, all voiced concerns/questions addressed appropriately from PT perspective.    OBJECTIVE IMPAIRMENTS: Abnormal gait, decreased activity tolerance, decreased balance, decreased endurance, decreased mobility, difficulty walking, decreased ROM, decreased strength, impaired perceived functional ability, improper body mechanics, postural dysfunction, and pain.   ACTIVITY LIMITATIONS: carrying, lifting, bending, standing, squatting, stairs, transfers, and locomotion level  PARTICIPATION LIMITATIONS: meal prep, cleaning, laundry, driving, and community activity  PERSONAL FACTORS: Age, Time since onset of injury/illness/exacerbation, and 3+ comorbidities: anxiety/depression, DM2, gout, HTN, OSA, BIL THA are also affecting patient's functional outcome.   REHAB POTENTIAL: Good  CLINICAL DECISION MAKING: Evolving/moderate complexity  EVALUATION COMPLEXITY: Moderate   GOALS:   SHORT TERM GOALS: Target date: 10/05/2023   Pt will demonstrate appropriate understanding and performance of initially prescribed HEP in order to facilitate improved independence with management of symptoms.  Baseline: HEP established  Goal status: Met  2. Pt will report at least 25% improvement in overall pain levels  over past week in order to facilitate improved tolerance to typical daily activities.   Baseline: 0-8/10; 10/08/23 5/10  Goal status: Met  LONG TERM GOALS: Target date: 11/02/2023  Pt will score 60 or greater on LEFS in order to demonstrate improved perception of function due to symptoms (MCID 9 pts) Baseline: 48/80; 77/80 Goal status: Met  2.  Pt will be able to perform TUG in less than or equal to 10 sec w/o mechanics grossly WNL and LRAD in order to indicate reduced risk of falling (cutoff score  for fall risk 13.5 sec in community dwelling older adults per [facility] et al, 2000)  Baseline: 11sec significant antalgic gait, no AD; 10/08/23 7s   Goal status: Met  3.  Pt will report at least 50% decrease in overall pain levels in past week in order to facilitate improved tolerance to basic ADLs/mobility.   Baseline: 0-8/10; 0-5/10  Goal status: Met  4.  Pt will be able to perform 5xSTS in less than or equal to 12sec no UE support in order to demonstrate reduced fall risk and improved functional independence (MCID 5xSTS = 2.3 sec). Baseline: 15sec UE support; 10/08/23 12s w/o UE support Goal status: Met  5. Pt will be able to navigate up to a single flight of stairs w/ reciprocal pattern and no UE support in order to facilitate improved safety w/ home navigation.  Baseline: reports inc time/effort and altered mechanics; 10/08/23 16 steps with B rails and step through pattern  Goal status: Met   PLAN:  PT FREQUENCY: 1-2x/week  PT DURATION: 8 weeks  PLANNED INTERVENTIONS: 97164- PT Re-evaluation, 97750- Physical Performance Testing, 97110-Therapeutic exercises, 97530- Therapeutic activity, [id number]- Neuromuscular re-education, 97535- Self Care, 40981- Manual therapy, [phone]- Gait training, Patient/Family education, Balance training, Stair training, Taping, Dry Needling, Joint mobilization, Cryotherapy, and Moist heat  PLAN FOR NEXT SESSION: Review/update HEP PRN. Work on functional mobility, Equities trader, glute activation. Symptom modification strategies as indicated/appropriate.    Jeff Bence Trapp PT  10/08/2023, 12:24 PM   For all possible CPT codes, reference the Planned Interventions line above.     Check all conditions that are expected to impact treatment: {Conditions expected to impact treatment:Musculoskeletal disorders

## 2023-10-11 ENCOUNTER — Encounter

## 2023-10-15 ENCOUNTER — Encounter

## 2023-10-17 ENCOUNTER — Encounter: Admitting: Physical Therapy

## 2023-11-10 ENCOUNTER — Emergency Department (HOSPITAL_BASED_OUTPATIENT_CLINIC_OR_DEPARTMENT_OTHER): Admitting: Radiology

## 2023-11-10 ENCOUNTER — Other Ambulatory Visit: Payer: Self-pay

## 2023-11-10 ENCOUNTER — Emergency Department (HOSPITAL_BASED_OUTPATIENT_CLINIC_OR_DEPARTMENT_OTHER)
Admission: EM | Admit: 2023-11-10 | Discharge: 2023-11-10 | Disposition: A | Discharge status: Home / Self Care | Attending: Emergency Medicine | Admitting: Emergency Medicine

## 2023-11-10 ENCOUNTER — Encounter (HOSPITAL_BASED_OUTPATIENT_CLINIC_OR_DEPARTMENT_OTHER): Payer: Self-pay

## 2023-11-10 DIAGNOSIS — S93402A Sprain of unspecified ligament of left ankle, initial encounter: Secondary | ICD-10-CM | POA: Diagnosis not present

## 2023-11-10 DIAGNOSIS — I10 Essential (primary) hypertension: Secondary | ICD-10-CM | POA: Insufficient documentation

## 2023-11-10 DIAGNOSIS — Z7984 Long term (current) use of oral hypoglycemic drugs: Secondary | ICD-10-CM | POA: Diagnosis not present

## 2023-11-10 DIAGNOSIS — S99912A Unspecified injury of left ankle, initial encounter: Secondary | ICD-10-CM | POA: Diagnosis present

## 2023-11-10 DIAGNOSIS — X58XXXA Exposure to other specified factors, initial encounter: Secondary | ICD-10-CM | POA: Diagnosis not present

## 2023-11-10 DIAGNOSIS — Z7982 Long term (current) use of aspirin: Secondary | ICD-10-CM | POA: Diagnosis not present

## 2023-11-10 DIAGNOSIS — Z79899 Other long term (current) drug therapy: Secondary | ICD-10-CM | POA: Insufficient documentation

## 2023-11-10 DIAGNOSIS — R030 Elevated blood-pressure reading, without diagnosis of hypertension: Secondary | ICD-10-CM

## 2023-11-10 MED ORDER — IBUPROFEN 400 MG PO TABS
400.0000 mg | ORAL_TABLET | Freq: Once | ORAL | Status: AC
  Administered 2023-11-10: 400 mg via ORAL
  Filled 2023-11-10: qty 1

## 2023-11-10 MED ORDER — ACETAMINOPHEN 500 MG PO TABS
1000.0000 mg | ORAL_TABLET | Freq: Once | ORAL | Status: AC
  Administered 2023-11-10: 1000 mg via ORAL
  Filled 2023-11-10: qty 2

## 2023-11-10 NOTE — ED Triage Notes (Signed)
 PT woke up today with left ankle pain, report possible injury going down stair yesterday.   PT Aox4, 4 out of 10, mostly on the back (Aquiles tendon area), left ankle visibly swell.   Hx of gout.

## 2023-11-10 NOTE — Discharge Instructions (Addendum)
 It was our pleasure to provide your ER care today - we hope that you feel better.  Ice/coldpack to sore area. Take acetaminophen  or ibuprofen  as need. Wear ankle brace/support as need for comfort and support for the next few days.   Follow up with primary care doctor in 1-2 weeks if symptoms fail to improve/resolve.  Also follow up with your doctor regarding your high blood pressure.  Return to ER if worse, new symptoms, new/severe pain, numbness/weakness, or other concern.

## 2023-11-10 NOTE — ED Provider Notes (Signed)
 [place] EMERGENCY DEPARTMENT AT [facility] Provider Note   CSN: [id number] Arrival date & time: 11/10/23  2151     Patient presents with: No chief complaint on file.   Frank Howe is a 48 y.o. male.   Patient c/o pain/injury to left ankle. States this AM was coming down steps at home. Unsure exact mechanism of twisting or injury. C/o pain to ankle area. Has been ambulatory on ankle today. No foot pain. No knee pain. Skin intact. No foot numbness/weakness. Denies other pain or injury. Denies any other pain or injury associated with the event.   The history is provided by the patient and medical records.       Prior to Admission medications   Medication Sig Start Date End Date Taking? Authorizing Provider  amLODipine  (NORVASC ) 10 MG tablet Take 10 mg by mouth daily. 02/09/21   [provider]  aspirin  EC 81 MG tablet Take 1 tablet (81 mg total) by mouth 2 (two) times daily. 08/20/23   Orlando Camellia POUR, PA-C  carvedilol  (COREG ) 3.125 MG tablet Take 3.125 mg by mouth 2 (two) times daily. 03/02/23   [provider]  colchicine  0.6 MG tablet Take 1 tablet (0.6 mg total) by mouth daily as needed (gout flare). 01/17/21 08/24/23  Cheryle Page, MD  loperamide  (IMODIUM  A-D) 2 MG tablet Take 2 tablets (4 mg total) by mouth 4 (four) times daily as needed for diarrhea or loose stools. Patient not taking: Reported on 05/04/2023 11/14/21   Joesph Shaver Scales, PA-C  losartan -hydrochlorothiazide  (HYZAAR) 100-12.5 MG tablet Take 1 tablet by mouth daily. 10/01/21   [provider]  meloxicam  (MOBIC ) 7.5 MG tablet Take 7.5 mg by mouth 2 (two) times daily. 08/16/23   [provider]  Menthol , Topical Analgesic, (BENGAY EX) Apply 1 Application topically daily as needed (pain).    [provider]  metFORMIN  (GLUCOPHAGE -XR) 500 MG 24 hr tablet Take 500 mg by mouth at bedtime. 07/18/21   [provider]  NON FORMULARY Pt uses a c-pap nightly     [provider]  ondansetron  (ZOFRAN -ODT) 8 MG disintegrating tablet Take 1 tablet (8 mg total) by mouth every 8 (eight) hours as needed for nausea or vomiting. 11/14/21   Joesph Shaver Scales, PA-C  oxyCODONE -acetaminophen  (PERCOCET/ROXICET) 5-325 MG tablet Take 1 tablet by mouth every 4 (four) hours as needed for severe pain (pain score 7-10). 08/20/23   Orlando Camellia K, PA-C  OZEMPIC, 2 MG/DOSE, 8 MG/3ML SOPN Inject 2 mg into the skin once a week. 04/09/23   [provider]  rosuvastatin (CRESTOR) 10 MG tablet Take 10 mg by mouth at bedtime. 04/20/23   [provider]  tiZANidine  (ZANAFLEX ) 2 MG tablet Take 1 tablet (2 mg total) by mouth every 6 (six) hours as needed for muscle spasms. 08/20/23   Orlando Camellia POUR, PA-C  Vitamin D, Ergocalciferol, (DRISDOL) 1.25 MG (50000 UNIT) CAPS capsule Take 50,000 Units by mouth 2 (two) times a week. Monday and Friday 03/02/23   [provider]    Allergies: Patient has no known allergies.    Review of Systems  Constitutional:  Negative for fever.  Musculoskeletal:        Left ankle pain  Skin:  Negative for wound.  Neurological:  Negative for weakness and numbness.    Updated Vital Signs BP (!) 148/89 (BP Location: Right Arm)   Pulse 82   Temp 98.7 F (37.1 C) (Oral)   Resp 16   SpO2 99%  Physical Exam Vitals and nursing note reviewed.  Constitutional:      Appearance: Normal appearance. He is well-developed.  HENT:     Head: Atraumatic.  Eyes:     General: No scleral icterus.    Conjunctiva/sclera: Conjunctivae normal.  Neck:     Trachea: No tracheal deviation.  Cardiovascular:     Rate and Rhythm: Normal rate.     Pulses: Normal pulses.  Pulmonary:     Effort: Pulmonary effort is normal. No accessory muscle usage or respiratory distress.  Musculoskeletal:        General: No swelling.     Cervical back: Normal range of motion and neck supple.     Comments: Left ankle grossly stable. Skin  intact. Dp/pt palp. Normal cap refill in toes. Mild tenderness lateral and medial malleolus and posterior heel. Achilles appears grossly intact. No 5th mt tenderness.   Skin:    General: Skin is warm and dry.     Findings: No rash.  Neurological:     Mental Status: He is alert.     Comments: Alert, speech clear. Left foot nvi.   Psychiatric:        Mood and Affect: Mood normal.     (all labs ordered are listed, but only abnormal results are displayed) Labs Reviewed - No data to display  EKG: None  Radiology: DG Foot Complete Left Result Date: 11/10/2023 CLINICAL DATA:  Left ankle pain. EXAM: LEFT FOOT - COMPLETE 3+ VIEW COMPARISON:  None Available. FINDINGS: There is no evidence of fracture or dislocation. Degenerative changes are seen involving the first metatarsophalangeal joint. Additional mild degenerative changes are seen along the dorsal aspect of the proximal left foot. Mild to moderate severity diffuse soft tissue swelling is noted. IMPRESSION: 1. Degenerative changes involving the first metatarsophalangeal joint. 2. Mild to moderate severity diffuse soft tissue swelling. Electronically Signed   By: Suzen Dials M.D.   On: 11/10/2023 22:33   DG Ankle Complete Left Result Date: 11/10/2023 CLINICAL DATA:  Left ankle pain. EXAM: LEFT ANKLE COMPLETE - 3+ VIEW COMPARISON:  None Available. FINDINGS: There is no evidence of fracture, dislocation, or joint effusion. There is no evidence of significant arthropathy or other focal bone abnormality. There is moderate severity diffuse soft tissue swelling. IMPRESSION: Moderate severity diffuse soft tissue swelling without evidence of acute fracture or dislocation. Electronically Signed   By: Suzen Dials M.D.   On: 11/10/2023 22:32     Procedures   Medications Ordered in the ED  ibuprofen  (ADVIL ) tablet 400 mg (has no administration in time range)  acetaminophen  (TYLENOL ) tablet 1,000 mg (has no administration in time range)                                     Medical Decision Making Problems Addressed: Elevated blood pressure reading: acute illness or injury Essential hypertension: chronic illness or injury that poses a threat to life or bodily functions Moderate left ankle sprain, initial encounter: acute illness or injury  Amount and/or Complexity of Data Reviewed External Data Reviewed: notes. Radiology: ordered and independent interpretation performed. Decision-making details documented in ED Course.  Risk OTC drugs. Prescription drug management.   Imaging ordered.   Reviewed nursing notes and prior charts for additional history.   Xrays reviewed/interpreted by me - no fx.   No meds pta. Ibuprofen  po. Acetaminophen  po.   Ankle brace.   Rec pcp f/u.  Return precautions provided.        Final diagnoses:  Moderate left ankle sprain, initial encounter  Elevated blood pressure reading  Essential hypertension    ED Discharge Orders     None          Bernard Drivers, MD 11/10/23 2238

## 2023-11-10 NOTE — ED Notes (Signed)
 Dc instructions given, pt verbalized understanding. Air cast on L ankle in place. Pt out of ED with all belongings, paperwork, with steady gait not in visible distress.

## 2023-12-07 ENCOUNTER — Ambulatory Visit: Admitting: Podiatry

## 2023-12-07 ENCOUNTER — Ambulatory Visit (INDEPENDENT_AMBULATORY_CARE_PROVIDER_SITE_OTHER)

## 2023-12-07 DIAGNOSIS — M7662 Achilles tendinitis, left leg: Secondary | ICD-10-CM

## 2023-12-07 DIAGNOSIS — M205X2 Other deformities of toe(s) (acquired), left foot: Secondary | ICD-10-CM

## 2023-12-07 DIAGNOSIS — M25572 Pain in left ankle and joints of left foot: Secondary | ICD-10-CM

## 2023-12-07 MED ORDER — METHYLPREDNISOLONE 4 MG PO TBPK: ORAL_TABLET | ORAL | 0 refills | Status: AC

## 2023-12-07 NOTE — Patient Instructions (Signed)

## 2023-12-07 NOTE — Progress Notes (Signed)
 Patient presents with complaint of pain around the first metatarsal phalangeal joint and the Achilles tendinitis left.  Is been bothering him for several month.  He is taken meloxicam  and ice.  Pain is waxed and waned.  Has not noticed  swelling in the area.  Still recall any injury to the foot or ankle.   Physical exam:  General appearance: Pleasant, and in no acute distress. AOx3.  Vascular: Pedal pulses: DP 2/4 bilaterally, PT 23/4 bilaterally.  Moderate edema lower legs bilaterally. Capillary fill time immediate.  Neurological: Light touch intact feet bilaterally.  Normal Achilles reflex bilaterally.  No clonus or spasticity noted.   Dermatologic:   Skin normal temperature bilaterally.  Skin normal color, tone, and texture bilaterally.   Musculoskeletal: Tenderness at posterior aspect heel at Achilles tendon insertion left.  No defects in Achilles tendon palpable.  Tenderness around the first metatarsal phalangeal joint of the left foot.  Severe limitation in range of motion first MTP with significant osteophytic changes palpable.  Radiographs: 3 views foot left: Severe uneven joint space narrowing and squaring off of the first metatarsal head at the first MTP.  Significant osteophytic changes around the first metatarsal phalangeal joint.  Osteophytic change in the posterior aspect calcaneus and some early.  No assenting fractures or dislocations.  Kagar's triangle intact  Diagnosis: 1.  Arthralgia first MTP left. 2.  Achilles tendinitis left. 3.  Hallux limitus left.  Plan: -New patient office visit for for evaluation and management 3. - Discussed with him the hallux limitus and resulting arthralgia as well as he can Achilles tendinitis.  I think some of the Achilles tendinitis might be from inciting for the pain at the first metatarsal phalangeal. Discussed conservative versus surgical treatment at this. -Written exercise instructions for Achilles tendinitis given -Wear good  stable supportive shoes. -RICE -He has a pneumatic cast at home he can wear this to help take stress off the foot and ankle. -Rx prednisone  530 mg p.o. daily first day then decrease by 5 mg every other day for 12 days  Return 2 weeks follow-up Achilles tendinitis and pain first MTP left

## 2023-12-25 ENCOUNTER — Ambulatory Visit: Admitting: Podiatry

## 2023-12-25 ENCOUNTER — Encounter: Payer: Self-pay | Admitting: Podiatry

## 2023-12-25 VITALS — Ht 69.0 in | Wt 272.0 lb

## 2023-12-25 DIAGNOSIS — M7662 Achilles tendinitis, left leg: Secondary | ICD-10-CM | POA: Diagnosis not present

## 2023-12-25 MED ORDER — TRIAMCINOLONE ACETONIDE 10 MG/ML IJ SUSP
10.0000 mg | Freq: Once | INTRAMUSCULAR | Status: AC
  Administered 2023-12-25: 10 mg

## 2023-12-25 NOTE — Progress Notes (Signed)
 Patient presents for follow-up of Achilles tendinitis and pain of the first metatarsal phalangeal joint left.  More pain today around the Achilles tendon.  Says the prednisone  seems to have helped decrease the pain.  Physical exam:  General appearance: Pleasant, and in no acute distress. AOx3.  Vascular: Pedal pulses: DP 2/4 bilaterally, PT 2 to/4 bilaterally.  Mild to moderate edema lower legs bilaterally. Capillary fill time immediate bilaterally.  Neurological: Grossly intact bilaterally  Dermatologic:   Skin normal temperature bilaterally.  Skin normal color, tone, and texture bilaterally.   Musculoskeletal: Tenderness at the distal 3 cm of the Achilles tendon especially on the medial aspect of the insertion.  No defects in Achilles tendon noted.    Diagnosis: 1.  Achilles tendinitis left.  Plan: -Continue icing.  Says he has been doing it very often. -Dispensed OTC foot orthoses bilaterally -Wear good supportive shoes with good arch support and stability bilaterally -injected 3cc 2:1 mixture 0.5 cc Marcaine :Kenolog 10mg /34ml around the tendon at the insertion taking care not to violate the tendon left.    Return 2 weeks follow-up injection left

## 2024-01-09 ENCOUNTER — Ambulatory Visit: Admitting: Podiatry

## 2024-01-14 ENCOUNTER — Encounter: Attending: Family Medicine | Admitting: Dietician

## 2024-01-14 ENCOUNTER — Ambulatory Visit: Admitting: Podiatry

## 2024-01-14 ENCOUNTER — Encounter: Payer: Self-pay | Admitting: Podiatry

## 2024-01-14 ENCOUNTER — Encounter: Payer: Self-pay | Admitting: Dietician

## 2024-01-14 VITALS — Ht 68.0 in | Wt 282.1 lb

## 2024-01-14 DIAGNOSIS — E669 Obesity, unspecified: Secondary | ICD-10-CM | POA: Diagnosis present

## 2024-01-14 DIAGNOSIS — E1169 Type 2 diabetes mellitus with other specified complication: Secondary | ICD-10-CM | POA: Diagnosis present

## 2024-01-14 DIAGNOSIS — M7662 Achilles tendinitis, left leg: Secondary | ICD-10-CM

## 2024-01-14 MED ORDER — TRIAMCINOLONE ACETONIDE 10 MG/ML IJ SUSP
10.0000 mg | Freq: Once | INTRAMUSCULAR | Status: AC
  Administered 2024-01-14: 10 mg

## 2024-01-14 NOTE — Progress Notes (Signed)
 Presents today follow-up plantar fasciitis left.  Did not get a lot of improvement with the injection.  First MTP is doing much better.   Physical exam:  General appearance: Pleasant, and in no acute distress. AOx3.  Vascular: Pedal pulses: DP 2/4 bilaterally, PT 2/4 bilaterally.  Moderate edema lower legs bilaterally. Capillary fill time immediate bilaterally.  Neurological: Grossly intact bilaterally to  Dermatologic:   Skin normal temperature bilaterally.  Skin normal color, tone, and texture bilaterally.   Musculoskeletal: Tenderness posterior aspect heel left and along the distal 2 to 3 cm of the Achilles tendon.  ]  Diagnosis: 1.  Achilles tendinitis left-no significant improvement.  Plan: -Continue icing and stretching. -injected 3cc 2:1 mixture 0.5 cc Marcaine :Kenolog 10mg /26ml around Achilles tendon left.  Taking care not to violate the tendon itself.   Return 2 weeks follow-up injection Achilles tendon LT

## 2024-01-14 NOTE — Patient Instructions (Addendum)
 Begin to lower your consumption of sugary beverages (Soda, Juice, Tea, Energy drinks), and replace some of them with water .  Choose fruit or protein bars as a snack in place of nabs or cakes.  Choose boiled eggs, [place] bacon, and whole wheat bread to make a breakfast sandwich you can take with you!   Choose grilled or baked options when eating away from home and minimize fried foods as much as possible!  Check your blood sugar each morning before eating or drinking (fasting). Look for numbers between 70-130 mg/dL

## 2024-01-14 NOTE — Progress Notes (Signed)
 Diabetes Self-Management Education  Visit Type: First/Initial  Appt. Start Time: 1120 Appt. End Time: 1230  01/14/2024  [name], identified by name and date of birth, is a 48 y.o. male with a diagnosis of Diabetes: Type 2.   ASSESSMENT Height 5' 8 (1.727 m), weight 282 lb 1.6 oz (128 kg). Body mass index is 42.89 kg/m.  Pt reports taking Ozempic @ 2mg  weekly and Metformin  @500  mg daily, reports they feel like the Ozempic is less effective (increased appetite, weight gain) for the last 3 months, been taking it 2-3 years. Pt reports desire to get back to losing weight again. Pt reports missing breakfast most days, wakes up ~9:00-10:00 am, won't eat until ~12:00 noon, states they are busy all the time, choosing a lot of foods/drinks that are from convenience stores/fast food. Pt reports high consumption of SSBs (tea, minute maid, soda, energy drinks), minimal plain water  consumption. Pt reports hx of hip surgeries in January and June, achilles tendonitis in R ankle, prevents pt from being as active as they would like.    Diabetes Self-Management Education - 01/11/24 1149       Visit Information   Visit Type First/Initial      Initial Visit   Diabetes Type Type 2      Psychosocial Assessment   Patient Belief/Attitude about Diabetes Motivated to manage diabetes    What is the hardest part about your diabetes right now, causing you the most concern, or is the most worrisome to you about your diabetes?   Getting support / problem solving;Making healty food and beverage choices    Self-care barriers None    Self-management support Doctor's office;Family    Other persons present Patient    Patient Concerns Nutrition/Meal planning;Weight Control;Glycemic Control    Special Needs None    Preferred Learning Style No preference indicated    Learning Readiness Not Ready      Pre-Education Assessment   Patient understands the diabetes disease and treatment process. Needs  Instruction    Patient understands incorporating nutritional management into lifestyle. Needs Instruction    Patient undertands incorporating physical activity into lifestyle. Needs Instruction    Patient understands using medications safely. Needs Instruction    Patient understands monitoring blood glucose, interpreting and using results Needs Instruction    Patient understands prevention, detection, and treatment of acute complications. Needs Instruction    Patient understands prevention, detection, and treatment of chronic complications. Needs Instruction    Patient understands how to develop strategies to address psychosocial issues. Needs Instruction    Patient understands how to develop strategies to promote health/change behavior. Needs Instruction      Complications   Last HgB A1C per patient/outside source 5.9 %   08/06/2023   How often do you check your blood sugar? 1-2 times/day    Fasting Blood glucose range (mg/dL) 29-870    Postprandial Blood glucose range (mg/dL) 29-870      Dietary Intake   Breakfast None (usually french toast and potato wedges)    Lunch Chicken soup, egg yolk, mixed vegetables    Dinner 2 slices veggie pizza    Beverage(s) Lipton orange tea, Strawberry lemonade      Activity / Exercise   Activity / Exercise Type ADL's      Patient Education   Disease Pathophysiology Definition of diabetes, type 1 and 2, and the diagnosis of diabetes;Factors that contribute to the development of diabetes    Healthy Eating Role of diet in the treatment  of diabetes and the relationship between the three main macronutrients and blood glucose level    Being Active Helped patient identify appropriate exercises in relation to his/her diabetes, diabetes complications and other health issue.    Medications Reviewed patients medication for diabetes, action, purpose, timing of dose and side effects.    Monitoring Identified appropriate SMBG and/or A1C goals.    Acute complications  Taught prevention, symptoms, and  treatment of hypoglycemia - the 15 rule.    Chronic complications Relationship between chronic complications and blood glucose control;Assessed and discussed foot care and prevention of foot problems    Diabetes Stress and Support Identified and addressed patients feelings and concerns about diabetes;Worked with patient to identify barriers to care and solutions    Lifestyle and Health Coping Lifestyle issues that need to be addressed for better diabetes care      Individualized Goals (developed by patient)   Nutrition General guidelines for healthy choices and portions discussed    Medications take my medication as prescribed    Monitoring  Test my blood glucose as discussed    Problem Solving Eating Pattern;Addressing barriers to behavior change    Reducing Risk examine blood glucose patterns    Health Coping Ask for help with psychological, social, or emotional issues      Post-Education Assessment   Patient understands the diabetes disease and treatment process. Needs Review    Patient understands incorporating nutritional management into lifestyle. Needs Review    Patient undertands incorporating physical activity into lifestyle. Needs Review    Patient understands using medications safely. Needs Review    Patient understands monitoring blood glucose, interpreting and using results Needs Review    Patient understands prevention, detection, and treatment of acute complications. Needs Review    Patient understands prevention, detection, and treatment of chronic complications. Needs Review    Patient understands how to develop strategies to address psychosocial issues. Needs Review    Patient understands how to develop strategies to promote health/change behavior. Needs Review      Outcomes   Expected Outcomes Demonstrated limited interest in learning.  Expect minimal changes          Individualized Plan for Diabetes Self-Management Training:    Learning Objective:  Patient will have a greater understanding of diabetes self-management. Patient education plan is to attend individual and/or group sessions per assessed needs and concerns.   Plan:   Patient Instructions  Begin to lower your consumption of sugary beverages (Soda, Juice, Tea, Energy drinks), and replace some of them with water .  Choose fruit or protein bars as a snack in place of nabs or cakes.  Choose boiled eggs, [place] bacon, and whole wheat bread to make a breakfast sandwich you can take with you!   Choose grilled or baked options when eating away from home and minimize fried foods as much as possible!  Check your blood sugar each morning before eating or drinking (fasting). Look for numbers between 70-130 mg/dL      Expected Outcomes:  Demonstrated limited interest in learning.  Expect minimal changes  Education material provided: ADA - How to Thrive: A Guide for Your Journey with Diabetes  If problems or questions, patient to contact team via:  Phone and Email  Future DSME appointment:

## 2024-01-28 ENCOUNTER — Ambulatory Visit (INDEPENDENT_AMBULATORY_CARE_PROVIDER_SITE_OTHER)

## 2024-01-28 ENCOUNTER — Ambulatory Visit: Admitting: Podiatry

## 2024-01-28 DIAGNOSIS — M779 Enthesopathy, unspecified: Secondary | ICD-10-CM

## 2024-01-28 DIAGNOSIS — M898X7 Other specified disorders of bone, ankle and foot: Secondary | ICD-10-CM

## 2024-01-28 DIAGNOSIS — M25571 Pain in right ankle and joints of right foot: Secondary | ICD-10-CM

## 2024-01-28 NOTE — Progress Notes (Signed)
 Patient presents today follow-up Achilles tendinitis left which has mostly resolved after injection.  Having more pain now at the dorsal lateral aspect of the foot on the right.  He indicates in the area of the sinus tarsi right foot.  Does not recall any injury to the area.  This began in the last week or so.  Has not noticed any redness or ecchymosis   Physical exam:  General appearance: Pleasant, and in no acute distress. AOx3.  Vascular: Pedal pulses: DP 2/4 bilaterally, PT 2/4 bilaterally.  Mild edema lower legs bilaterally. Capillary fill time immediate bilaterally.  Neurological: Grossly intact bilaterally  Dermatologic:   Skin normal temperature bilaterally.  Skin normal color, tone, and texture bilaterally.   Musculoskeletal: Tenderness at sinus tarsi and with range of motion of subtalar joint right.  No tenderness today at the posterior aspect heel on the left.  Achilles tendon has no evidence any defects.  Radiographs: 3 views foot right: None of any fractures or dislocations.  Significant amount of joint space narrowing and osteophytic changes in the midfoot.  Good bone density.  Noticed any bone tumors.  No evidence of any coalitions.  Diagnosis: 1.  Arthralgia subtalar joint right 2.  Exostosis right foot  Plan: -Discussed with her the pain in the subtalar joint recommend injection today.  Wear good stable supportive shoes doing well on the left continue wearing shoes and stretching for this.  -injected 3cc 2:1 mixture 0.5 cc Marcaine :Kenolog 10mg /32ml at subtalar joint and sinus tarsi right.    Return 2 weeks follow-up injection right

## 2024-02-13 ENCOUNTER — Ambulatory Visit: Admitting: Podiatry

## 2024-03-10 ENCOUNTER — Ambulatory Visit: Admitting: Dietician

## 2024-03-24 ENCOUNTER — Encounter: Payer: Self-pay | Admitting: Dietician

## 2024-03-24 ENCOUNTER — Encounter: Admitting: Dietician

## 2024-03-24 DIAGNOSIS — E669 Obesity, unspecified: Secondary | ICD-10-CM | POA: Diagnosis present

## 2024-03-24 DIAGNOSIS — E119 Type 2 diabetes mellitus without complications: Secondary | ICD-10-CM | POA: Diagnosis present

## 2024-03-24 NOTE — Progress Notes (Signed)
 Diabetes Self-Management Education  Visit Type: Follow-up  Appt. Start Time: 1600 Appt. End Time: 1630  03/24/2024  [name], identified by name and date of birth, is a 48 y.o. male with a diagnosis of Diabetes:  .   ASSESSMENT Pt reports not taking any DM medication over the last few months, reports GI upset Pt reports not checking blood sugar either. Pt reports continuing ankle pain, reports getting an insole for his foot arch, believes this has caused more ankle pain, pt reports getting relief with walking boot. Pt reports getting a new job in October, working first shift, on their feet a lot and active at work. Pt reports drinking aloe water  during work.   Diabetes Self-Management Education - 03/24/24 1712       Visit Information   Visit Type Follow-up      Psychosocial Assessment   Learning Readiness Not Ready      Pre-Education Assessment   Patient understands the diabetes disease and treatment process. Needs Review    Patient understands incorporating nutritional management into lifestyle. Needs Review    Patient undertands incorporating physical activity into lifestyle. Needs Review    Patient understands using medications safely. Needs Review    Patient understands monitoring blood glucose, interpreting and using results Needs Review    Patient understands prevention, detection, and treatment of acute complications. Needs Review    Patient understands prevention, detection, and treatment of chronic complications. Needs Review    Patient understands how to develop strategies to address psychosocial issues. Needs Review    Patient understands how to develop strategies to promote health/change behavior. Needs Review      Complications   How often do you check your blood sugar? 0 times/day (not testing)      Dietary Intake   Lunch Mustard greens, stuffing, cranberry sauce, yams, 2 glasses of Hawaiian punch    Snack (afternoon) 3 nutty buddies, 2 christmas tree  cakes, 4 white chocolate covered Oreos    Beverage(s) Hawaiian punch      Activity / Exercise   Activity / Exercise Type ADL's    How many days per week do you exercise? 0      Patient Education   Disease Pathophysiology Explored patient's options for treatment of their diabetes    Healthy Eating Meal options for control of blood glucose level and chronic complications.    Medications Reviewed patients medication for diabetes, action, purpose, timing of dose and side effects.    Monitoring Purpose and frequency of SMBG.;Identified appropriate SMBG and/or A1C goals.    Chronic complications Relationship between chronic complications and blood glucose control;Assessed and discussed foot care and prevention of foot problems    Diabetes Stress and Support Identified and addressed patients feelings and concerns about diabetes;Worked with patient to identify barriers to care and solutions    Lifestyle and Health Coping Lifestyle issues that need to be addressed for better diabetes care   Taking meds as prescribed, SMBG     Individualized Goals (developed by patient)   Nutrition Follow meal plan discussed    Medications take my medication as prescribed    Monitoring  Test my blood glucose as discussed    Problem Solving Medication consistency    Reducing Risk examine blood glucose patterns      Patient Self-Evaluation of Goals - Patient rates self as meeting previously set goals (% of time)   Nutrition < 25% (hardly ever/never)    Physical Activity < 25% (hardly ever/never)  Medications < 25% (hardly ever/never)    Monitoring < 25% (hardly ever/never)    Problem Solving and behavior change strategies  < 25% (hardly ever/never)    Reducing Risk (treating acute and chronic complications) < 25% (hardly ever/never)    Health Coping < 25% (hardly ever/never)      Post-Education Assessment   Patient understands the diabetes disease and treatment process. Needs Review    Patient understands  incorporating nutritional management into lifestyle. Needs Review    Patient undertands incorporating physical activity into lifestyle. Needs Review    Patient understands using medications safely. Needs Review    Patient understands monitoring blood glucose, interpreting and using results Needs Review    Patient understands prevention, detection, and treatment of acute complications. Needs Review    Patient understands prevention, detection, and treatment of chronic complications. Needs Review    Patient understands how to develop strategies to address psychosocial issues. Needs Review    Patient understands how to develop strategies to promote health/change behavior. Needs Review      Outcomes   Expected Outcomes Demonstrated limited interest in learning.  Expect minimal changes    Future DMSE 2 months    Program Status Not Completed      Subsequent Visit   Since your last visit have you continued or begun to take your medications as prescribed? No    Since your last visit have you had your blood pressure checked? No    Since your last visit have you experienced any weight changes? No change    Since your last visit, are you checking your blood glucose at least once a day? No          Individualized Plan for Diabetes Self-Management Training:   Learning Objective:  Patient will have a greater understanding of diabetes self-management. Patient education plan is to attend individual and/or group sessions per assessed needs and concerns.   Plan:   Patient Instructions  Check your blood sugar in the morning at least twice a week within 5-10 minutes of waking. Choose a week day and a weekend day to test. Your numbers should stay below 130 mg/dL.   Restart taking your metformin , and take it after your dinner meal on a full stomach!  Have a small glass of aloe water  (4-8 oz) and balance it out with a bottle (16 oz) of plain water .  Expected Outcomes:  Demonstrated limited interest  in learning.  Expect minimal changes  If problems or questions, patient to contact team via:  Phone and Email  Future DSME appointment: 2 months

## 2024-03-24 NOTE — Patient Instructions (Addendum)
 Check your blood sugar in the morning at least twice a week within 5-10 minutes of waking. Choose a week day and a weekend day to test. Your numbers should stay below 130 mg/dL.   Restart taking your metformin , and take it after your dinner meal on a full stomach!  Have a small glass of aloe water  (4-8 oz) and balance it out with a bottle (16 oz) of plain water .

## 2024-05-05 ENCOUNTER — Encounter: Attending: Family Medicine | Admitting: Dietician

## 2024-05-05 ENCOUNTER — Encounter: Payer: Self-pay | Admitting: Dietician

## 2024-05-05 DIAGNOSIS — E669 Obesity, unspecified: Secondary | ICD-10-CM | POA: Insufficient documentation

## 2024-05-05 DIAGNOSIS — E119 Type 2 diabetes mellitus without complications: Secondary | ICD-10-CM | POA: Diagnosis present

## 2024-05-05 NOTE — Progress Notes (Unsigned)
 Diabetes Self-Management Education  Visit Type: Follow-up  Appt. Start Time: 1610 Appt. End Time: 1650  05/06/2024  [name], identified by name and date of birth, is a 49 y.o. male with a diagnosis of Diabetes:  .   ASSESSMENT Pt reports restarting metformin  @500  mg BID, and Ozempic @2mg  weekly, reports occasional diarrhea but is trying to take it after a meal. Pt reports decrease in appetite since restarting Ozempic. Pt reports finding their glucometer but is not checking blood sugar, lost their lancing device and is waiting for a new prescription to get a new lancing device. Pt reports continuing job, 6:00 am - 2:00 pm Mon-Fri at [employer]  Pt reports foot and ankle are getting better with insoles, states they still have limited mobility. Pt reports having 2 sores on their ankles, states they just look like bruises. Pt reports eating breakfast daily now, reduced intake of sweets, eating greens (green beans, broccoli) with meals. Pt reports drinking more water  (~2 L) while at work, also drinking sugar free drink mixes and aloe.   Diabetes Self-Management Education - 05/06/24 1500       Visit Information   Visit Type Follow-up      Psychosocial Assessment   Learning Readiness Not Ready      Pre-Education Assessment   Patient understands the diabetes disease and treatment process. Needs Review    Patient understands incorporating nutritional management into lifestyle. Needs Review    Patient undertands incorporating physical activity into lifestyle. Needs Review    Patient understands using medications safely. Needs Review    Patient understands monitoring blood glucose, interpreting and using results Needs Review    Patient understands prevention, detection, and treatment of acute complications. Needs Review    Patient understands prevention, detection, and treatment of chronic complications. Needs Review    Patient understands how to develop strategies to address  psychosocial issues. Needs Review    Patient understands how to develop strategies to promote health/change behavior. Needs Review      Complications   Last HgB A1C per patient/outside source 6 %   03/28/2024   How often do you check your blood sugar? 0 times/day (not testing)      Dietary Intake   Breakfast 2-3 boiled eggs or cereal, lucky charms w/ 2% milk    Lunch Hibachi chicken and shrimp, rice, zucchini    Dinner 2 cheese hot pockets, small bag of hot fries    Beverage(s) Tea, Minute maid Tropical punch      Activity / Exercise   Activity / Exercise Type ADL's    How many days per week do you exercise? 0      Patient Education   Disease Pathophysiology Explored patient's options for treatment of their diabetes    Healthy Eating Reviewed blood glucose goals for pre and post meals and how to evaluate the patients' food intake on their blood glucose level.;Information on hints to eating out and maintain blood glucose control.    Medications Reviewed patients medication for diabetes, action, purpose, timing of dose and side effects.    Monitoring Identified appropriate SMBG and/or A1C goals.    Chronic complications Assessed and discussed foot care and prevention of foot problems    Diabetes Stress and Support Worked with patient to identify barriers to care and solutions    Lifestyle and Health Coping Lifestyle issues that need to be addressed for better diabetes care      Individualized Goals (developed by patient)   Nutrition Follow  meal plan discussed    Physical Activity Not Applicable    Medications take my medication as prescribed    Monitoring  Test my blood glucose as discussed    Reducing Risk examine blood glucose patterns      Patient Self-Evaluation of Goals - Patient rates self as meeting previously set goals (% of time)   Nutrition < 25% (hardly ever/never)    Physical Activity < 25% (hardly ever/never)    Medications 50 - 75 % (half of the time)    Monitoring <  25% (hardly ever/never)    Problem Solving and behavior change strategies  < 25% (hardly ever/never)    Reducing Risk (treating acute and chronic complications) < 25% (hardly ever/never)    Health Coping < 25% (hardly ever/never)      Post-Education Assessment   Patient understands the diabetes disease and treatment process. Needs Review    Patient understands incorporating nutritional management into lifestyle. Needs Review    Patient undertands incorporating physical activity into lifestyle. Needs Review    Patient understands using medications safely. Needs Review    Patient understands monitoring blood glucose, interpreting and using results Needs Review    Patient understands prevention, detection, and treatment of acute complications. Needs Review    Patient understands prevention, detection, and treatment of chronic complications. Needs Review    Patient understands how to develop strategies to address psychosocial issues. Needs Review    Patient understands how to develop strategies to promote health/change behavior. Needs Review      Outcomes   Expected Outcomes Demonstrated limited interest in learning.  Expect minimal changes    Future DMSE 3-4 months    Program Status Not Completed      Subsequent Visit   Since your last visit have you continued or begun to take your medications as prescribed? Yes    Since your last visit, are you checking your blood glucose at least once a day? No          Individualized Plan for Diabetes Self-Management Training:   Learning Objective:  Patient will have a greater understanding of diabetes self-management. Patient education plan is to attend individual and/or group sessions per assessed needs and concerns.   Plan:   Patient Instructions  Take your voucher to the pharmacy to get your free meter and supplies!  Check your blood sugar each morning before eating or drinking (fasting). Look for numbers under 100 mg/dL  Keep up the  great work drinking your sugar free beverages and having green vegetables with your meals.  Contact your podiatrist IMMEDIATELY to have them assess the sores on your ankle!          Expected Outcomes:  Demonstrated limited interest in learning.  Expect minimal changes  Education material provided: Glucometer Voucher  If problems or questions, patient to contact team via:  Phone and Email  Future DSME appointment: 3-4 months

## 2024-05-05 NOTE — Patient Instructions (Addendum)
 Take your voucher to the pharmacy to get your free meter and supplies!  Check your blood sugar each morning before eating or drinking (fasting). Look for numbers under 100 mg/dL  Keep up the great work drinking your sugar free beverages and having green vegetables with your meals.  Contact your podiatrist IMMEDIATELY to have them assess the sores on your ankle!

## 2024-05-06 ENCOUNTER — Encounter: Payer: Self-pay | Admitting: Dietician

## 2024-05-20 ENCOUNTER — Ambulatory Visit: Admitting: Podiatry

## 2024-05-20 DIAGNOSIS — M25571 Pain in right ankle and joints of right foot: Secondary | ICD-10-CM | POA: Diagnosis not present

## 2024-05-20 MED ORDER — TRIAMCINOLONE ACETONIDE 10 MG/ML IJ SUSP
10.0000 mg | Freq: Once | INTRAMUSCULAR | Status: AC
  Administered 2024-05-20: 10 mg

## 2024-05-20 NOTE — Progress Notes (Signed)
 Patient presents for the first time in about 6 months with complaint of pain around the sinus tarsi on the right foot.  No pain at the Achilles tendon.  Does not recall any injury to it.  Has not noticed any redness or ecchymosis or swelling.   Physical exam:  General appearance: Pleasant, and in no acute distress. AOx3.  Vascular: Pedal pulses: DP 2/4 bilaterally, PT 2/4 bilaterally.  Moderate edema lower legs bilaterally. Capillary fill time needed bilaterally.  Neurological: Grossly intact bilaterally  Dermatologic:   Skin normal temperature bilaterally.  Skin normal color, tone, and texture bilaterally.   Musculoskeletal: Tenderness in the sinus tarsi and with range of motion subtalar joint right.  Diminished subtalar joint motion right.   Diagnosis: 1.  Arthralgia subtalar joint right  Plan: -Wear good stable shoes.  Ice 20 minutes several times an hour.  Elevate feet.  - Injected 3cc 2:1 mixture 0.5 cc Marcaine : Triamcinolone  40mg /67ml at subtalar joint sinus tarsi right.    Return 2 weeks follow-up injection STJ right

## 2024-05-21 ENCOUNTER — Ambulatory Visit: Admitting: Podiatry

## 2024-06-03 ENCOUNTER — Ambulatory Visit: Admitting: Podiatry

## 2024-07-07 ENCOUNTER — Encounter: Admitting: Dietician
